# Patient Record
Sex: Male | Born: 1955 | Race: White | Hispanic: No | Marital: Married
Health system: Southern US, Community
[De-identification: ages and names within clinical notes are randomized; demographics above are authoritative.]

## PROBLEM LIST (undated history)

## (undated) DIAGNOSIS — J159 Unspecified bacterial pneumonia: Secondary | ICD-10-CM

## (undated) DIAGNOSIS — I4891 Unspecified atrial fibrillation: Secondary | ICD-10-CM

## (undated) DIAGNOSIS — G610 Guillain-Barre syndrome: Secondary | ICD-10-CM

## (undated) DIAGNOSIS — J9621 Acute and chronic respiratory failure with hypoxia: Secondary | ICD-10-CM

## (undated) DIAGNOSIS — G901 Familial dysautonomia [Riley-Day]: Secondary | ICD-10-CM

---

## 2014-04-01 ENCOUNTER — Other Ambulatory Visit: Payer: Self-pay | Admitting: Sports Medicine

## 2014-04-01 DIAGNOSIS — M79672 Pain in left foot: Secondary | ICD-10-CM

## 2014-04-04 ENCOUNTER — Ambulatory Visit
Admission: RE | Admit: 2014-04-04 | Discharge: 2014-04-04 | Disposition: A | Discharge status: Home / Self Care | Payer: Federal, State, Local not specified - PPO | Source: Ambulatory Visit | Attending: Sports Medicine | Admitting: Sports Medicine

## 2014-04-04 DIAGNOSIS — M79672 Pain in left foot: Secondary | ICD-10-CM

## 2019-05-05 ENCOUNTER — Other Ambulatory Visit: Payer: Self-pay | Admitting: *Deleted

## 2019-05-05 ENCOUNTER — Telehealth (HOSPITAL_COMMUNITY): Payer: Self-pay | Admitting: Emergency Medicine

## 2019-05-05 DIAGNOSIS — Z9189 Other specified personal risk factors, not elsewhere classified: Secondary | ICD-10-CM

## 2019-05-05 NOTE — Telephone Encounter (Signed)
Reaching out to patient to offer assistance regarding upcoming cardiac imaging study; pt verbalizes understanding of appt date/time, parking situation and where to check in, pre-test NPO status and medications ordered, and verified current allergies; name and call back number provided for further questions should they arise Laurine Kuyper RN Navigator Cardiac Imaging [facility] [phone] office [phone] cell 

## 2019-05-05 NOTE — Telephone Encounter (Signed)
Returning call after patient had left me a VM, unable to speak at the time but asked me to call his wife. I called the wife's phone number x 2 with no answer and no answering machine.

## 2019-05-06 ENCOUNTER — Ambulatory Visit (HOSPITAL_COMMUNITY)
Admission: RE | Admit: 2019-05-06 | Discharge: 2019-05-06 | Disposition: A | Discharge status: Home / Self Care | Payer: Medicare Other | Source: Ambulatory Visit | Attending: Cardiovascular Disease | Admitting: Cardiovascular Disease

## 2019-05-06 ENCOUNTER — Other Ambulatory Visit: Payer: Self-pay

## 2019-05-06 DIAGNOSIS — Z9189 Other specified personal risk factors, not elsewhere classified: Secondary | ICD-10-CM

## 2019-05-06 DIAGNOSIS — I4892 Unspecified atrial flutter: Secondary | ICD-10-CM | POA: Diagnosis not present

## 2019-05-06 DIAGNOSIS — I2510 Atherosclerotic heart disease of native coronary artery without angina pectoris: Secondary | ICD-10-CM | POA: Insufficient documentation

## 2019-05-06 DIAGNOSIS — R918 Other nonspecific abnormal finding of lung field: Secondary | ICD-10-CM | POA: Diagnosis not present

## 2019-05-06 MED ORDER — NITROGLYCERIN 0.4 MG SL SUBL
0.8000 mg | SUBLINGUAL_TABLET | Freq: Once | SUBLINGUAL | Status: AC
  Administered 2019-05-06: 14:00:00 0.8 mg via SUBLINGUAL

## 2019-05-06 MED ORDER — IOHEXOL 350 MG/ML SOLN
100.0000 mL | Freq: Once | INTRAVENOUS | Status: AC | PRN
  Administered 2019-05-06: 100 mL via INTRAVENOUS

## 2019-05-06 MED ORDER — NITROGLYCERIN 0.4 MG SL SUBL
SUBLINGUAL_TABLET | SUBLINGUAL | Status: AC
  Filled 2019-05-06: qty 2

## 2019-05-07 ENCOUNTER — Telehealth: Payer: Self-pay | Admitting: Cardiovascular Disease

## 2019-05-07 DIAGNOSIS — I2510 Atherosclerotic heart disease of native coronary artery without angina pectoris: Secondary | ICD-10-CM | POA: Diagnosis not present

## 2019-05-07 NOTE — Telephone Encounter (Signed)
Per Dr. Johnsie Cancel, Radiology needs to call primary cardiologist. Will send to Marchia Bond RN, CT Navigator to help get over read to right doctor.

## 2019-05-07 NOTE — Telephone Encounter (Signed)
[facility] is calling about a call report about ct scoring. Please advise.

## 2019-06-22 ENCOUNTER — Ambulatory Visit: Payer: Medicare Other

## 2019-07-01 ENCOUNTER — Ambulatory Visit: Payer: Medicare Other | Attending: Internal Medicine

## 2019-07-01 DIAGNOSIS — Z23 Encounter for immunization: Secondary | ICD-10-CM

## 2019-07-01 NOTE — Progress Notes (Signed)
   Covid-19 Vaccination Clinic  Name:  Brian Espinoza    MRN: [id number] DOB: March 08, 1956  07/01/2019  [name] was observed post Covid-19 immunization for 15 minutes without incidence. He was provided with Vaccine Information Sheet and instruction to access the V-Safe system.   [name] was instructed to call 911 with any severe reactions post vaccine: Marland Kitchen Difficulty breathing  . Swelling of your face and throat  . A fast heartbeat  . A bad rash all over your body  . Dizziness and weakness    Immunizations Administered    Name Date Dose VIS Date Route   Pfizer COVID-19 Vaccine 07/01/2019  5:13 PM 0.3 mL 05/07/2019 Intramuscular   Manufacturer: [employer], [employer]   Lot: [id number]   NDC: [address]-6

## 2019-07-03 ENCOUNTER — Ambulatory Visit: Payer: Medicare Other

## 2019-07-26 ENCOUNTER — Ambulatory Visit: Payer: Medicare Other | Attending: Internal Medicine

## 2019-07-26 DIAGNOSIS — Z23 Encounter for immunization: Secondary | ICD-10-CM

## 2019-07-26 NOTE — Progress Notes (Signed)
   Covid-19 Vaccination Clinic  Name:  Brian Espinoza    MRN: [id number] DOB: [birth year]  07/26/2019  Mr. Brian Espinoza was observed post Covid-19 immunization for 15 minutes without incidence. He was provided with Vaccine Information Sheet and instruction to access the V-Safe system.   Mr. Brian Espinoza was instructed to call 911 with any severe reactions post vaccine: Marland Kitchen Difficulty breathing  . Swelling of your face and throat  . A fast heartbeat  . A bad rash all over your body  . Dizziness and weakness    Immunizations Administered    Name Date Dose VIS Date Route   Pfizer COVID-19 Vaccine 07/26/2019 12:53 PM 0.3 mL 05/07/2019 Intramuscular   Manufacturer: [employer], [employer]   Lot: [id number]   NDC: [address]-9

## 2020-06-05 ENCOUNTER — Other Ambulatory Visit (HOSPITAL_COMMUNITY): Payer: Medicare Other

## 2020-06-05 ENCOUNTER — Inpatient Hospital Stay
Admission: RE | Admit: 2020-06-05 | Discharge: 2020-09-01 | Disposition: A | Discharge status: Other Acute Inpatient Hospital | Payer: Medicare Other | Source: Other Acute Inpatient Hospital | Attending: Internal Medicine | Admitting: Internal Medicine

## 2020-06-05 DIAGNOSIS — R509 Fever, unspecified: Secondary | ICD-10-CM

## 2020-06-05 DIAGNOSIS — J9811 Atelectasis: Secondary | ICD-10-CM

## 2020-06-05 DIAGNOSIS — J9690 Respiratory failure, unspecified, unspecified whether with hypoxia or hypercapnia: Secondary | ICD-10-CM

## 2020-06-05 DIAGNOSIS — J189 Pneumonia, unspecified organism: Secondary | ICD-10-CM

## 2020-06-05 DIAGNOSIS — J159 Unspecified bacterial pneumonia: Secondary | ICD-10-CM | POA: Diagnosis present

## 2020-06-05 DIAGNOSIS — R0902 Hypoxemia: Secondary | ICD-10-CM

## 2020-06-05 DIAGNOSIS — G610 Guillain-Barre syndrome: Secondary | ICD-10-CM | POA: Diagnosis present

## 2020-06-05 DIAGNOSIS — I4891 Unspecified atrial fibrillation: Secondary | ICD-10-CM | POA: Diagnosis present

## 2020-06-05 DIAGNOSIS — Z931 Gastrostomy status: Secondary | ICD-10-CM

## 2020-06-05 DIAGNOSIS — G901 Familial dysautonomia [Riley-Day]: Secondary | ICD-10-CM

## 2020-06-05 DIAGNOSIS — J9621 Acute and chronic respiratory failure with hypoxia: Secondary | ICD-10-CM | POA: Diagnosis present

## 2020-06-05 HISTORY — DX: Unspecified bacterial pneumonia: J15.9

## 2020-06-05 HISTORY — DX: Unspecified atrial fibrillation: I48.91

## 2020-06-05 HISTORY — DX: Familial dysautonomia (riley-day): G90.1

## 2020-06-05 HISTORY — DX: Acute and chronic respiratory failure with hypoxia: J96.21

## 2020-06-05 HISTORY — DX: Guillain-Barre syndrome: G61.0

## 2020-06-05 LAB — BLOOD GAS, ARTERIAL
Acid-Base Excess: 3.4 mmol/L — ABNORMAL HIGH (ref 0.0–2.0)
Bicarbonate: 28.6 mmol/L — ABNORMAL HIGH (ref 20.0–28.0)
FIO2: 50
O2 Saturation: 98.5 %
Patient temperature: 37.5
pCO2 arterial: 54.8 mmHg — ABNORMAL HIGH (ref 32.0–48.0)
pH, Arterial: 7.34 — ABNORMAL LOW (ref 7.350–7.450)
pO2, Arterial: 131 mmHg — ABNORMAL HIGH (ref 83.0–108.0)

## 2020-06-05 MED ORDER — IOHEXOL 180 MG/ML  SOLN
50.0000 mL | Freq: Once | INTRAMUSCULAR | Status: AC | PRN
  Administered 2020-06-05: 50 mL

## 2020-06-06 LAB — CBC WITH DIFFERENTIAL/PLATELET
Abs Immature Granulocytes: 0.2 10*3/uL — ABNORMAL HIGH (ref 0.00–0.07)
Basophils Absolute: 0.1 10*3/uL (ref 0.0–0.1)
Basophils Relative: 0 %
Eosinophils Absolute: 0 10*3/uL (ref 0.0–0.5)
Eosinophils Relative: 0 %
HCT: 31.5 % — ABNORMAL LOW (ref 39.0–52.0)
Hemoglobin: 10 g/dL — ABNORMAL LOW (ref 13.0–17.0)
Immature Granulocytes: 1 %
Lymphocytes Relative: 13 %
Lymphs Abs: 2.3 10*3/uL (ref 0.7–4.0)
MCH: 29.2 pg (ref 26.0–34.0)
MCHC: 31.7 g/dL (ref 30.0–36.0)
MCV: 92.1 fL (ref 80.0–100.0)
Monocytes Absolute: 0.9 10*3/uL (ref 0.1–1.0)
Monocytes Relative: 5 %
Neutro Abs: 14.8 10*3/uL — ABNORMAL HIGH (ref 1.7–7.7)
Neutrophils Relative %: 81 %
Platelets: 223 10*3/uL (ref 150–400)
RBC: 3.42 MIL/uL — ABNORMAL LOW (ref 4.22–5.81)
RDW: 16 % — ABNORMAL HIGH (ref 11.5–15.5)
WBC: 18.3 10*3/uL — ABNORMAL HIGH (ref 4.0–10.5)
nRBC: 0 % (ref 0.0–0.2)

## 2020-06-06 LAB — COMPREHENSIVE METABOLIC PANEL
ALT: 43 U/L (ref 0–44)
AST: 32 U/L (ref 15–41)
Albumin: 2.7 g/dL — ABNORMAL LOW (ref 3.5–5.0)
Alkaline Phosphatase: 63 U/L (ref 38–126)
Anion gap: 7 (ref 5–15)
BUN: 12 mg/dL (ref 8–23)
CO2: 29 mmol/L (ref 22–32)
Calcium: 8.9 mg/dL (ref 8.9–10.3)
Chloride: 100 mmol/L (ref 98–111)
Creatinine, Ser: 0.33 mg/dL — ABNORMAL LOW (ref 0.61–1.24)
GFR, Estimated: >60 mL/min (ref >60)
Glucose, Bld: 145 mg/dL — ABNORMAL HIGH (ref 70–99)
Potassium: 3.9 mmol/L (ref 3.5–5.1)
Sodium: 136 mmol/L (ref 135–145)
Total Bilirubin: 0.8 mg/dL (ref 0.3–1.2)
Total Protein: 6.8 g/dL (ref 6.5–8.1)

## 2020-06-06 LAB — HEMOGLOBIN A1C
Hgb A1c MFr Bld: 4.9 % (ref 4.8–5.6)
Mean Plasma Glucose: 93.93 mg/dL

## 2020-06-06 LAB — URINALYSIS, ROUTINE W REFLEX MICROSCOPIC
Bilirubin Urine: NEGATIVE
Glucose, UA: NEGATIVE mg/dL
Hgb urine dipstick: NEGATIVE
Ketones, ur: NEGATIVE mg/dL
Leukocytes,Ua: NEGATIVE
Nitrite: NEGATIVE
Protein, ur: NEGATIVE mg/dL
Specific Gravity, Urine: 1.025 (ref 1.005–1.030)
pH: 6 (ref 5.0–8.0)

## 2020-06-06 LAB — TSH: TSH: 3.257 u[IU]/mL (ref 0.350–4.500)

## 2020-06-06 NOTE — Consult Note (Signed)
Referring Physician: S. Manson Passey, MD  Brian Espinoza is an 65 y.o. male.                       Chief Complaint: Atrial fibrillation and CHF   HPI: 65 years old white male with PMH of Acute inflammatory demyelinating polyneuropathy (AIDP), paroxysmal atrial flutter/atrial fibriullation, anemia, acute on chronic respiratory failure with hypoxia, S/P pneumonia, S/P tracheostomy, S/P PEG tube placement, Hypertension, type 2 DM, Hyperlipidemia, Chronic systolic heart failure and CAD is admitted for long term acute care. Patient has trach collar and is sedated with fentanyl drip for agitation. He is also on IV Cefepime for pneumonia.   Past medical history: As per HPI.   The histories are not reviewed yet. Please review them in the "History" navigator section and refresh this SmartLink.  No family history on file. Social History:  has no history on file for tobacco use, alcohol use, and drug use.  Allergies: No Known Allergies  No medications prior to admission.  Fentanyl 1-200 mcg/hr as needed. Guaifenesin 10 ml q 6 hr. Versed 2 mg. IV q 2 hr as needed. Cefepime 2 gm q 8 hr. Oxycodone 10 mg. q 6 hr as needed. Lyrica 150 mg. 3 times daily. Seroquel 25 mg. 3 times daily.  Results for orders placed or performed during the hospital encounter of 06/05/20 (from the past 48 hour(s))  Blood gas, arterial     Status: Abnormal   Collection Time: 06/05/20  8:50 PM  Result Value Ref Range   FIO2 50.00    pH, Arterial 7.340 (L) 7.350 - 7.450   pCO2 arterial 54.8 (H) 32.0 - 48.0 mmHg   pO2, Arterial 131 (H) 83.0 - 108.0 mmHg   Bicarbonate 28.6 (H) 20.0 - 28.0 mmol/L   Acid-Base Excess 3.4 (H) 0.0 - 2.0 mmol/L   O2 Saturation 98.5 %   Patient temperature 37.5    Collection site RIGHT RADIAL    Drawn by COLLECTED BY RT     Comment: C.HERNANDEZ RCP   Sample type ARTERIAL    Allens test (pass/fail) PASS PASS    Comment: Performed at [facility], [address], [place], [place] [address]   Comprehensive metabolic panel     Status: Abnormal   Collection Time: 06/06/20  4:18 AM  Result Value Ref Range   Sodium 136 135 - 145 mmol/L   Potassium 3.9 3.5 - 5.1 mmol/L   Chloride 100 98 - 111 mmol/L   CO2 29 22 - 32 mmol/L   Glucose, Bld 145 (H) 70 - 99 mg/dL    Comment: Glucose reference range applies only to samples taken after fasting for at least 8 hours.   BUN 12 8 - 23 mg/dL   Creatinine, Ser 3.41 (L) 0.61 - 1.24 mg/dL   Calcium 8.9 8.9 - 93.7 mg/dL   Total Protein 6.8 6.5 - 8.1 g/dL   Albumin 2.7 (L) 3.5 - 5.0 g/dL   AST 32 15 - 41 U/L   ALT 43 0 - 44 U/L   Alkaline Phosphatase 63 38 - 126 U/L   Total Bilirubin 0.8 0.3 - 1.2 mg/dL   GFR, Estimated >90 >24 mL/min    Comment: (NOTE) Calculated using the CKD-EPI Creatinine Equation (2021)    Anion gap 7 5 - 15    Comment: Performed at [facility], [address], [place], [place] [address]  CBC with Differential/Platelet     Status: Abnormal  Collection Time: 06/06/20  4:18 AM  Result Value Ref Range   WBC 18.3 (H) 4.0 - 10.5 K/uL   RBC 3.42 (L) 4.22 - 5.81 MIL/uL   Hemoglobin 10.0 (L) 13.0 - 17.0 g/dL   HCT 62.7 (L) 03.5 - 00.9 %   MCV 92.1 80.0 - 100.0 fL   MCH 29.2 26.0 - 34.0 pg   MCHC 31.7 30.0 - 36.0 g/dL   RDW 38.1 (H) 82.9 - 93.7 %   Platelets 223 150 - 400 K/uL   nRBC 0.0 0.0 - 0.2 %   Neutrophils Relative % 81 %   Neutro Abs 14.8 (H) 1.7 - 7.7 K/uL   Lymphocytes Relative 13 %   Lymphs Abs 2.3 0.7 - 4.0 K/uL   Monocytes Relative 5 %   Monocytes Absolute 0.9 0.1 - 1.0 K/uL   Eosinophils Relative 0 %   Eosinophils Absolute 0.0 0.0 - 0.5 K/uL   Basophils Relative 0 %   Basophils Absolute 0.1 0.0 - 0.1 K/uL   Immature Granulocytes 1 %   Abs Immature Granulocytes 0.20 (H) 0.00 - 0.07 K/uL    Comment: Performed at [facility], [address] [place], [place] [address]  Hemoglobin A1c     Status: None   Collection Time: 06/06/20  4:18 AM  Result Value Ref Range   Hgb A1c MFr Bld 4.9  4.8 - 5.6 %    Comment: (NOTE) Pre diabetes:          5.7%-6.4%  Diabetes:              >6.4%  Glycemic control for   <7.0% adults with diabetes    Mean Plasma Glucose 93.93 mg/dL    Comment: Performed at [facility], [address] [place], [place] [address]  TSH     Status: None   Collection Time: 06/06/20  4:18 AM  Result Value Ref Range   TSH 3.257 0.350 - 4.500 uIU/mL    Comment: Performed by a 3rd Generation assay with a functional sensitivity of <=0.01 uIU/mL. Performed at [facility], [address] [place], [place] [address]   Urinalysis, Routine w reflex microscopic Urine, Random     Status: Abnormal   Collection Time: 06/06/20 11:26 AM  Result Value Ref Range   Color, Urine AMBER (A) YELLOW    Comment: BIOCHEMICALS MAY BE AFFECTED BY COLOR   APPearance HAZY (A) CLEAR   Specific Gravity, Urine 1.025 1.005 - 1.030   pH 6.0 5.0 - 8.0   Glucose, UA NEGATIVE NEGATIVE mg/dL   Hgb urine dipstick NEGATIVE NEGATIVE   Bilirubin Urine NEGATIVE NEGATIVE   Ketones, ur NEGATIVE NEGATIVE mg/dL   Protein, ur NEGATIVE NEGATIVE mg/dL   Nitrite NEGATIVE NEGATIVE   Leukocytes,Ua NEGATIVE NEGATIVE    Comment: Performed at [facility], [address], [place], [place] [address]   DG Chest 1 View  Result Date: 06/05/2020 CLINICAL DATA:  65 year old male status post percutaneous gastrostomy placement. Respiratory failure. EXAM: CHEST  1 VIEW COMPARISON:  Chest CT dated 05/06/2019. FINDINGS: Tracheostomy with tip approximately 7.5 cm above the carina. Left sided PICC with tip over central SVC. Evaluation is limited due to patient's positioning. Right lung base opacity, likely combination of pleural effusion and associated atelectasis or infiltrate. There is overall decreased volume in the right lung with expansion of the left hemithorax. No pneumothorax. The cardiac borders are silhouetted. No acute osseous pathology. IMPRESSION: 1. Tracheostomy above the carina. 2.  Right lung base opacity, likely combination of pleural effusion and associated atelectasis or infiltrate. Electronically Signed   By: Elgie Collard M.D.   On: 06/05/2020 22:40   DG ABDOMEN PEG TUBE LOCATION  Result Date: 06/05/2020 CLINICAL DATA:  Peg placement EXAM: ABDOMEN - 1 VIEW COMPARISON:  None. FINDINGS: Contrast is seen within the stomach. No contrast extravasation. Gastrostomy tube projects over the mid to distal stomach. IMPRESSION: Peg tube within the stomach.  No contrast extravasation. Electronically Signed   By: Charlett Nose M.D.   On: 06/05/2020 22:39    Review Of Systems As per HPI.  P:79, R-18-24, BP : 137/72, O2 sat 98 % on 40 % FiO2, 12 A/C General appearance: agitated, appears stated age and moderate respiratory distress Head: Normocephalic, atraumatic. Eyes: Blue eyes, pale conjunctiva, corneas clear.  Neck: No adenopathy, no carotid bruit, no JVD, supple, symmetrical, tracheostomy in place. Resp: Clearing to auscultation bilaterally. Cardio: Regular rate and rhythm, S1, S2 normal, II/VI systolic murmur, no click, rub or gallop GI: Soft, non-tender; bowel sounds normal; no organomegaly. Extremities: Trace edema, no cyanosis or clubbing. Skin: Warm and dry.  Neurologic: Alert and oriented X 0, normal strength. Repeated jerky movements of the upper body.  Assessment/Plan Acute on chronic respiratory failure with hypoxia Paroxysmal atrial fibrillation with RVR Anemia CAD H/O CHF Metabolic encephalopathy S/P Tracheostomy S/P PEG Type 2 DM S/P pneumonia  Agree with metoprolol use as needed for heart rate control. Repeat Echocardiogram for LV function. Consider amiodarone for atrial fibrillation rate control if needed. Consider small dose lanoxin use if has RV failure Resume anticoagulation when Hemoglobin stabilizes.  Time spent: Review of old records, Lab, x-rays, EKG, other cardiac tests, examination, discussion with patient/Nurse/Physician over 70  minutes.  Ricki Rodriguez, MD  06/06/2020, 6:19 PM

## 2020-06-07 ENCOUNTER — Other Ambulatory Visit (HOSPITAL_COMMUNITY): Payer: Medicare Other

## 2020-06-07 DIAGNOSIS — J9621 Acute and chronic respiratory failure with hypoxia: Secondary | ICD-10-CM

## 2020-06-07 DIAGNOSIS — I4891 Unspecified atrial fibrillation: Secondary | ICD-10-CM

## 2020-06-07 DIAGNOSIS — G901 Familial dysautonomia [Riley-Day]: Secondary | ICD-10-CM

## 2020-06-07 DIAGNOSIS — G610 Guillain-Barre syndrome: Secondary | ICD-10-CM | POA: Diagnosis not present

## 2020-06-07 DIAGNOSIS — J159 Unspecified bacterial pneumonia: Secondary | ICD-10-CM

## 2020-06-07 LAB — ECHOCARDIOGRAM COMPLETE
AR max vel: 3.6 cm2
AV Area VTI: 3.58 cm2
AV Area mean vel: 3.58 cm2
AV Mean grad: 5 mmHg
AV Peak grad: 10.4 mmHg
Ao pk vel: 1.61 m/s
Area-P 1/2: 4.15 cm2
S' Lateral: 3.6 cm

## 2020-06-07 MED ORDER — PERFLUTREN LIPID MICROSPHERE
1.0000 mL | INTRAVENOUS | Status: AC | PRN
Start: 2020-06-07 — End: 2020-06-07
  Administered 2020-06-07: 4 mL via INTRAVENOUS

## 2020-06-07 NOTE — Progress Notes (Signed)
  Echocardiogram 2D Echocardiogram has been performed with Definity.  Gerda Diss 06/07/2020, 9:38 AM

## 2020-06-07 NOTE — Progress Notes (Signed)
Ref: Elana Alm, MD   Subjective:  Sedated and on ventilator support. Echocardiogram with preserved LV systolic function with mild MR. Monitor shows sinus rhythm.  Objective:  Vital Signs in the last 24 hours: P: 89, R: 15, BP: 138/72 O2 sat 97 % on 12 A/C and 40 % FiO2.   Physical Exam: BP Readings from Last 1 Encounters:  05/06/19 124/79     Wt Readings from Last 1 Encounters:  04/04/14 101.2 kg    Weight change:  There is no height or weight on file to calculate BMI. HEENT: [place]/AT, Eyes-Blue, Conjunctiva-Pale, Sclera-Non-icteric Neck: No JVD, No bruit, Trachea midline. Lungs:  Clear, Bilateral. Cardiac:  Regular rhythm, normal S1 and S2, no S3. II/VI systolic murmur. Abdomen:  Soft, non-tender. BS present. Extremities:  Trace edema present. No cyanosis. No clubbing. CNS: AxOx0, Decreased jerky movements of upper body today.  Skin: Warm and dry.   Intake/Output from previous day: No intake/output data recorded.    Lab Results: BMET    Component Value Date/Time   NA 136 06/06/2020 0418   K 3.9 06/06/2020 0418   CL 100 06/06/2020 0418   CO2 29 06/06/2020 0418   GLUCOSE 145 (H) 06/06/2020 0418   BUN 12 06/06/2020 0418   CREATININE 0.33 (L) 06/06/2020 0418   CALCIUM 8.9 06/06/2020 0418   GFRNONAA >60 06/06/2020 0418   CBC    Component Value Date/Time   WBC 18.3 (H) 06/06/2020 0418   RBC 3.42 (L) 06/06/2020 0418   HGB 10.0 (L) 06/06/2020 0418   HCT 31.5 (L) 06/06/2020 0418   PLT 223 06/06/2020 0418   MCV 92.1 06/06/2020 0418   MCH 29.2 06/06/2020 0418   MCHC 31.7 06/06/2020 0418   RDW 16.0 (H) 06/06/2020 0418   LYMPHSABS 2.3 06/06/2020 0418   MONOABS 0.9 06/06/2020 0418   EOSABS 0.0 06/06/2020 0418   BASOSABS 0.1 06/06/2020 0418   HEPATIC Function Panel Recent Labs    06/06/20 0418  PROT 6.8   HEMOGLOBIN A1C No components found for: HGA1C,  MPG CARDIAC ENZYMES No results found for: CKTOTAL, CKMB, CKMBINDEX, TROPONINI BNP No results for  input(s): PROBNP in the last 8760 hours. TSH Recent Labs    06/06/20 0418  TSH 3.257   CHOLESTEROL No results for input(s): CHOL in the last 8760 hours.  Scheduled Meds: Continuous Infusions: PRN Meds:.  Assessment/Plan: Acute on chronic respiratory failure with hypoxia Paroxysmal atrial fibrillation Anemia, chronic blood loss CAD Metabolic encephalopathy S/P tracheostomy S/P acute demyelinating polyneuropathy S/P PEG Type 2 DM S/P Pneumonia  Add small dose aspirin. Continue heparin SQ for DVT prophylaxis.    LOS: 0 days   Time spent including chart review, lab review, examination, discussion with patient/Nurse and family (Daughter) : 30 min   Orpah Cobb  MD  06/07/2020, 5:23 PM

## 2020-06-07 NOTE — Consult Note (Signed)
Pulmonary Critical Care Medicine [facility] [place]  PULMONARY SERVICE  Date of Service: 06/07/2020  PULMONARY CRITICAL CARE CONSULT   [name]  YIF:[id number]  DOB: [birth year]   DOA: 06/05/2020  Referring Physician: Carron Curie, MD  HPI: Brian Espinoza is a 65 y.o. male seen for follow up of Acute on Chronic Respiratory Failure.  Patient has multiple medical problems presented to the hospital with paresthesias sensory loss in the hands and feet and also difficulty walking.  Patient had been apparently complaining of having this difficulty which progressed into an ascending symmetric paralysis.  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  The patient received 5 days of plasmapheresis along with 5 days of IVIG and then the subsequent 5 days of IVIG was also given.  There was only slight improvement in the patient's symptoms and also had poor level of alertness.  Apparently it appears the patient did not have an EMG done at the other facility.  Review of Systems:  ROS performed and is unremarkable other than noted above.  Past Medical History:  Diagnosis Date  . Atrial flutter (*)  . Back pain  . CHF (congestive heart failure) (*)  . Diverticulosis  . GERD (gastroesophageal reflux disease)  . Hypertension  . Lung nodule  . Non-rheumatic mitral regurgitation  . Renal cyst  . Typical atrial flutter s/p CTI ablation 05/2019 04/09/2019   Past Surgical History:  Procedure Laterality Date  . Appendectomy  . Cataract extraction  . Cholecystectomy  . Colonoscopy w/ polypectomy  . Eye surgery Bilateral  . Knee scope  . Knee surgery Left  ARTHROPLASTY  . Tonsillectomy  . Wisdom tooth extraction   No Known Allergies    Medications: Reviewed on Rounds  Physical Exam:  Vitals: Temperature is 98.9 pulse 72 respiratory rate 17 blood pressure 156/78 saturations 98%  Ventilator Settings on pressure assist control FiO2 is 40% IP 14 PEEP  8  . General: Comfortable at this time . Eyes: Grossly normal lids, irises & conjunctiva . ENT: grossly tongue is normal . Neck: no obvious mass . Cardiovascular: S1-S2 normal no gallop or rub . Respiratory: No rhonchi very coarse breath sounds . Abdomen: Soft and nontender . Skin: no rash seen on limited exam . Musculoskeletal: not rigid . Psychiatric:unable to assess . Neurologic: no seizure no involuntary movements         Labs on Admission:  Basic Metabolic Panel: Recent Labs  Lab 06/06/20 0418  NA 136  K 3.9  CL 100  CO2 29  GLUCOSE 145*  BUN 12  CREATININE 0.33*  CALCIUM 8.9    Recent Labs  Lab 06/05/20 2050  PHART 7.340*  PCO2ART 54.8*  PO2ART 131*  HCO3 28.6*  O2SAT 98.5    Liver Function Tests: Recent Labs  Lab 06/06/20 0418  AST 32  ALT 43  ALKPHOS 63  BILITOT 0.8  PROT 6.8  ALBUMIN 2.7*   No results for input(s): LIPASE, AMYLASE in the last 168 hours. No results for input(s): AMMONIA in the last 168 hours.  CBC: Recent Labs  Lab 06/06/20 0418  WBC 18.3*  NEUTROABS 14.8*  HGB 10.0*  HCT 31.5*  MCV 92.1  PLT 223    Cardiac Enzymes: No results for input(s): CKTOTAL, CKMB, CKMBINDEX, TROPONINI in the last 168 hours.  BNP (last 3 results) No results for input(s): BNP in the last 8760 hours.  ProBNP (last 3 results) No results for input(s): PROBNP in  the last 8760 hours.   Radiological Exams on Admission: DG Chest 1 View  Result Date: 06/05/2020 CLINICAL DATA:  65 year old male status post percutaneous gastrostomy placement. Respiratory failure. EXAM: CHEST  1 VIEW COMPARISON:  Chest CT dated 05/06/2019. FINDINGS: Tracheostomy with tip approximately 7.5 cm above the carina. Left sided PICC with tip over central SVC. Evaluation is limited due to patient's positioning. Right lung base opacity, likely combination of pleural effusion and associated atelectasis or infiltrate. There is overall decreased volume in the right lung with  expansion of the left hemithorax. No pneumothorax. The cardiac borders are silhouetted. No acute osseous pathology. IMPRESSION: 1. Tracheostomy above the carina. 2. Right lung base opacity, likely combination of pleural effusion and associated atelectasis or infiltrate. Electronically Signed   By: Elgie CollardArash  Radparvar M.D.   On: 06/05/2020 22:40   DG ABDOMEN PEG TUBE LOCATION  Result Date: 06/05/2020 CLINICAL DATA:  Peg placement EXAM: ABDOMEN - 1 VIEW COMPARISON:  None. FINDINGS: Contrast is seen within the stomach. No contrast extravasation. Gastrostomy tube projects over the mid to distal stomach. IMPRESSION: Peg tube within the stomach.  No contrast extravasation. Electronically Signed   By: Charlett NoseKevin  Dover M.D.   On: 06/05/2020 22:39   ECHOCARDIOGRAM COMPLETE  Result Date: 06/07/2020    ECHOCARDIOGRAM REPORT   Patient Name:   Brian Espinoza Date of Exam: 06/07/2020 Medical Rec #:  [id number]  Height: Accession #:    [id number] Weight:       223.0 lb Date of Birth:  [birth year]  BSA:          2.171 m Patient Age:    64 years   BP:           145/74 mmHg Patient Gender: M          HR:           104 bpm. Exam Location:  Inpatient Procedure: 2D Echo, Cardiac Doppler, Color Doppler and Intracardiac            Opacification Agent Indications:     CHF-Acute Diastolic  History:         Patient has no prior history of Echocardiogram examinations.  Sonographer:     Ross LudwigArthur Guy RDCS (AE) Referring Phys:  [id number] Florian BuffSTEPHANIE DELORES BROWN Diagnosing Phys: Orpah CobbAjay Kadakia MD  Sonographer Comments: Technically difficult study due to poor echo windows and echo performed with patient supine and on artificial respirator. Technically difficult study due to patient movement. IMPRESSIONS  1. Left ventricular ejection fraction, by estimation, is 55 to 60%. The left ventricle has normal function. The left ventricle has no regional wall motion abnormalities. There is mild concentric left ventricular hypertrophy. Left ventricular diastolic  parameters are consistent with Grade II diastolic dysfunction (pseudonormalization).  2. Right ventricular systolic function is normal. The right ventricular size is normal.  3. Left atrial size was mildly dilated.  4. The mitral valve is grossly normal. Mild mitral valve regurgitation.  5. The aortic valve is tricuspid. There is mild calcification of the aortic valve. There is mild thickening of the aortic valve. Aortic valve regurgitation is not visualized. Mild aortic valve sclerosis is present, with no evidence of aortic valve stenosis.  6. Aortic dilatation noted. There is mild dilatation of the ascending aorta, measuring 43 mm.  7. The inferior vena cava is dilated in size with <50% respiratory variability, suggesting right atrial pressure of 15 mmHg. FINDINGS  Left Ventricle: Left ventricular ejection fraction, by estimation, is 55 to 60%. The left ventricle  has normal function. The left ventricle has no regional wall motion abnormalities. Definity contrast agent was given IV to delineate the left ventricular  endocardial borders. The left ventricular internal cavity size was normal in size. There is mild concentric left ventricular hypertrophy. Left ventricular diastolic parameters are consistent with Grade II diastolic dysfunction (pseudonormalization). Right Ventricle: The right ventricular size is normal. No increase in right ventricular wall thickness. Right ventricular systolic function is normal. Left Atrium: Left atrial size was mildly dilated. Right Atrium: Right atrial size was normal in size. Pericardium: There is no evidence of pericardial effusion. Mitral Valve: The mitral valve is grossly normal. Mild mitral annular calcification. Mild mitral valve regurgitation. Tricuspid Valve: The tricuspid valve is normal in structure. Tricuspid valve regurgitation is trivial. Aortic Valve: The aortic valve is tricuspid. There is mild calcification of the aortic valve. There is mild thickening of the aortic  valve. There is mild aortic valve annular calcification. Aortic valve regurgitation is not visualized. Mild aortic valve sclerosis is present, with no evidence of aortic valve stenosis. Aortic valve mean gradient measures 5.0 mmHg. Aortic valve peak gradient measures 10.4 mmHg. Aortic valve area, by VTI measures 3.58 cm. Pulmonic Valve: The pulmonic valve was not well visualized. Pulmonic valve regurgitation is not visualized. Aorta: Aortic dilatation noted. There is mild dilatation of the ascending aorta, measuring 43 mm. There is minimal (Grade I) atheroma plaque. Venous: The inferior vena cava is dilated in size with less than 50% respiratory variability, suggesting right atrial pressure of 15 mmHg. IAS/Shunts: The interatrial septum was not well visualized.  LEFT VENTRICLE PLAX 2D LVIDd:         5.40 cm  Diastology LVIDs:         3.60 cm  LV e' medial:    6.85 cm/s LV PW:         1.60 cm  LV E/e' medial:  12.8 LV IVS:        1.50 cm  LV e' lateral:   7.40 cm/s LVOT diam:     2.50 cm  LV E/e' lateral: 11.8 LV SV:         86 LV SV Index:   40 LVOT Area:     4.91 cm  RIGHT VENTRICLE             IVC RV Basal diam:  3.50 cm     IVC diam: 2.10 cm RV Mid diam:    3.40 cm RV S prime:     26.80 cm/s TAPSE (M-mode): 2.7 cm LEFT ATRIUM             Index       RIGHT ATRIUM           Index LA diam:        3.60 cm 1.66 cm/m  RA Area:     15.00 cm LA Vol (A2C):   40.4 ml 18.61 ml/m RA Volume:   36.60 ml  16.86 ml/m LA Vol (A4C):   52.2 ml 24.04 ml/m LA Biplane Vol: 49.9 ml 22.98 ml/m  AORTIC VALVE AV Area (Vmax):    3.60 cm AV Area (Vmean):   3.58 cm AV Area (VTI):     3.58 cm AV Vmax:           161.00 cm/s AV Vmean:          98.500 cm/s AV VTI:            0.241 m AV Peak Grad:  10.4 mmHg AV Mean Grad:      5.0 mmHg LVOT Vmax:         118.00 cm/s LVOT Vmean:        71.900 cm/s LVOT VTI:          0.176 m LVOT/AV VTI ratio: 0.73  AORTA Ao Root diam: 4.30 cm MITRAL VALVE MV Area (PHT): 4.15 cm    SHUNTS MV Decel  Time: 183 msec    Systemic VTI:  0.18 m MV E velocity: 87.40 cm/s  Systemic Diam: 2.50 cm MV A velocity: 61.70 cm/s MV E/A ratio:  1.42 Orpah Cobb MD Electronically signed by Orpah Cobb MD Signature Date/Time: 06/07/2020/1:59:26 PM    Final     Assessment/Plan Active Problems:   Acute on chronic respiratory failure with hypoxia (HCC)   Acute inflammatory demyelinating polyneuropathy (HCC)   Dysautonomia (HCC)   Atrial fibrillation with RVR (HCC)   Healthcare associated bacterial pneumonia   1. Acute on chronic respiratory failure with hypoxia respiratory therapy will check the rapid shallow index and try to start with the weaning protocol.  Patient right now appears to be doing fine appears to be comfortable. 2. Acute inflammatory demyelinating polyneuropathy patient will be evaluated by the physical therapy department and proceed with physical therapy as tolerated. 3. Dysautonomia we will continue to monitor the patient's blood pressure and vitals closely has been labile sometimes with elevation in blood pressure. 4. Atrial fibrillation atrial flutter this has been persistent with atrial fibrillation and flutter right now appears to be rate controlled we will continue to follow along closely. 5. Healthcare associated pneumonia has been treated we will continue with present management.  I have personally seen and evaluated the patient, evaluated laboratory and imaging results, formulated the assessment and plan and placed orders. The Patient requires high complexity decision making with multiple systems involvement.  Case was discussed on Rounds with the Respiratory Therapy Director and the Respiratory staff Time Spent  Yevonne Pax, MD [facility] Pulmonary Critical Care Medicine Sleep Medicine

## 2020-06-08 DIAGNOSIS — G901 Familial dysautonomia [Riley-Day]: Secondary | ICD-10-CM | POA: Diagnosis not present

## 2020-06-08 DIAGNOSIS — G610 Guillain-Barre syndrome: Secondary | ICD-10-CM | POA: Diagnosis not present

## 2020-06-08 DIAGNOSIS — J9621 Acute and chronic respiratory failure with hypoxia: Secondary | ICD-10-CM | POA: Diagnosis not present

## 2020-06-08 DIAGNOSIS — I4891 Unspecified atrial fibrillation: Secondary | ICD-10-CM | POA: Diagnosis not present

## 2020-06-08 NOTE — Progress Notes (Signed)
Pulmonary Critical Care Medicine [facility] [place]   PULMONARY CRITICAL CARE SERVICE  PROGRESS NOTE  Date of Service: 06/08/2020  Brian Espinoza  TKZ:[id number]  DOB: 07-12-1955   DOA: 06/05/2020  Referring Physician: Carron Curie, MD  HPI: Brian Espinoza is a 65 y.o. male seen for follow up of Acute on Chronic Respiratory Failure.  Patient currently is on pressure control mode has been on 40% FiO2 with an IP of 14 PEEP of seven  Medications: Reviewed on Rounds  Physical Exam:  Vitals: Temperature is 99.1 pulse 82 respiratory rate 27 blood pressure is 177/90 saturations 98%  Ventilator Settings on pressure control FiO2 is 40% PEEP seven IP 14  . General: Comfortable at this time . Eyes: Grossly normal lids, irises & conjunctiva . ENT: grossly tongue is normal . Neck: no obvious mass . Cardiovascular: S1 S2 normal no gallop . Respiratory: No rhonchi very coarse breath . Abdomen: soft . Skin: no rash seen on limited exam . Musculoskeletal: not rigid . Psychiatric:unable to assess . Neurologic: no seizure no involuntary movements         Lab Data:   Basic Metabolic Panel: Recent Labs  Lab 06/06/20 0418  NA 136  K 3.9  CL 100  CO2 29  GLUCOSE 145*  BUN 12  CREATININE 0.33*  CALCIUM 8.9    ABG: Recent Labs  Lab 06/05/20 2050  PHART 7.340*  PCO2ART 54.8*  PO2ART 131*  HCO3 28.6*  O2SAT 98.5    Liver Function Tests: Recent Labs  Lab 06/06/20 0418  AST 32  ALT 43  ALKPHOS 63  BILITOT 0.8  PROT 6.8  ALBUMIN 2.7*   No results for input(s): LIPASE, AMYLASE in the last 168 hours. No results for input(s): AMMONIA in the last 168 hours.  CBC: Recent Labs  Lab 06/06/20 0418  WBC 18.3*  NEUTROABS 14.8*  HGB 10.0*  HCT 31.5*  MCV 92.1  PLT 223    Cardiac Enzymes: No results for input(s): CKTOTAL, CKMB, CKMBINDEX, TROPONINI in the last 168 hours.  BNP (last 3 results) No results for input(s): BNP in the last 8760 hours.  ProBNP (last 3  results) No results for input(s): PROBNP in the last 8760 hours.  Radiological Exams: ECHOCARDIOGRAM COMPLETE  Result Date: 06/07/2020    ECHOCARDIOGRAM REPORT   Patient Name:   Brian Espinoza Date of Exam: 06/07/2020 Medical Rec #:  [id number]  Height: Accession #:    [id number] Weight:       223.0 lb Date of Birth:  08/17/1955  BSA:          2.171 m Patient Age:    64 years   BP:           145/74 mmHg Patient Gender: M          HR:           104 bpm. Exam Location:  Inpatient Procedure: 2D Echo, Cardiac Doppler, Color Doppler and Intracardiac            Opacification Agent Indications:     CHF-Acute Diastolic  History:         Patient has no prior history of Echocardiogram examinations.  Sonographer:     Ross Ludwig RDCS (AE) Referring Phys:  [id number] Florian Buff BROWN Diagnosing Phys: Orpah Cobb MD  Sonographer Comments: Technically difficult study due to poor echo windows and echo performed with patient supine and on artificial respirator. Technically difficult study due to patient movement. IMPRESSIONS  1. Left ventricular  ejection fraction, by estimation, is 55 to 60%. The left ventricle has normal function. The left ventricle has no regional wall motion abnormalities. There is mild concentric left ventricular hypertrophy. Left ventricular diastolic parameters are consistent with Grade II diastolic dysfunction (pseudonormalization).  2. Right ventricular systolic function is normal. The right ventricular size is normal.  3. Left atrial size was mildly dilated.  4. The mitral valve is grossly normal. Mild mitral valve regurgitation.  5. The aortic valve is tricuspid. There is mild calcification of the aortic valve. There is mild thickening of the aortic valve. Aortic valve regurgitation is not visualized. Mild aortic valve sclerosis is present, with no evidence of aortic valve stenosis.  6. Aortic dilatation noted. There is mild dilatation of the ascending aorta, measuring 43 mm.  7. The inferior vena  cava is dilated in size with <50% respiratory variability, suggesting right atrial pressure of 15 mmHg. FINDINGS  Left Ventricle: Left ventricular ejection fraction, by estimation, is 55 to 60%. The left ventricle has normal function. The left ventricle has no regional wall motion abnormalities. Definity contrast agent was given IV to delineate the left ventricular  endocardial borders. The left ventricular internal cavity size was normal in size. There is mild concentric left ventricular hypertrophy. Left ventricular diastolic parameters are consistent with Grade II diastolic dysfunction (pseudonormalization). Right Ventricle: The right ventricular size is normal. No increase in right ventricular wall thickness. Right ventricular systolic function is normal. Left Atrium: Left atrial size was mildly dilated. Right Atrium: Right atrial size was normal in size. Pericardium: There is no evidence of pericardial effusion. Mitral Valve: The mitral valve is grossly normal. Mild mitral annular calcification. Mild mitral valve regurgitation. Tricuspid Valve: The tricuspid valve is normal in structure. Tricuspid valve regurgitation is trivial. Aortic Valve: The aortic valve is tricuspid. There is mild calcification of the aortic valve. There is mild thickening of the aortic valve. There is mild aortic valve annular calcification. Aortic valve regurgitation is not visualized. Mild aortic valve sclerosis is present, with no evidence of aortic valve stenosis. Aortic valve mean gradient measures 5.0 mmHg. Aortic valve peak gradient measures 10.4 mmHg. Aortic valve area, by VTI measures 3.58 cm. Pulmonic Valve: The pulmonic valve was not well visualized. Pulmonic valve regurgitation is not visualized. Aorta: Aortic dilatation noted. There is mild dilatation of the ascending aorta, measuring 43 mm. There is minimal (Grade I) atheroma plaque. Venous: The inferior vena cava is dilated in size with less than 50% respiratory  variability, suggesting right atrial pressure of 15 mmHg. IAS/Shunts: The interatrial septum was not well visualized.  LEFT VENTRICLE PLAX 2D LVIDd:         5.40 cm  Diastology LVIDs:         3.60 cm  LV e' medial:    6.85 cm/s LV PW:         1.60 cm  LV E/e' medial:  12.8 LV IVS:        1.50 cm  LV e' lateral:   7.40 cm/s LVOT diam:     2.50 cm  LV E/e' lateral: 11.8 LV SV:         86 LV SV Index:   40 LVOT Area:     4.91 cm  RIGHT VENTRICLE             IVC RV Basal diam:  3.50 cm     IVC diam: 2.10 cm RV Mid diam:    3.40 cm RV S prime:  26.80 cm/s TAPSE (M-mode): 2.7 cm LEFT ATRIUM             Index       RIGHT ATRIUM           Index LA diam:        3.60 cm 1.66 cm/m  RA Area:     15.00 cm LA Vol (A2C):   40.4 ml 18.61 ml/m RA Volume:   36.60 ml  16.86 ml/m LA Vol (A4C):   52.2 ml 24.04 ml/m LA Biplane Vol: 49.9 ml 22.98 ml/m  AORTIC VALVE AV Area (Vmax):    3.60 cm AV Area (Vmean):   3.58 cm AV Area (VTI):     3.58 cm AV Vmax:           161.00 cm/s AV Vmean:          98.500 cm/s AV VTI:            0.241 m AV Peak Grad:      10.4 mmHg AV Mean Grad:      5.0 mmHg LVOT Vmax:         118.00 cm/s LVOT Vmean:        71.900 cm/s LVOT VTI:          0.176 m LVOT/AV VTI ratio: 0.73  AORTA Ao Root diam: 4.30 cm MITRAL VALVE MV Area (PHT): 4.15 cm    SHUNTS MV Decel Time: 183 msec    Systemic VTI:  0.18 m MV E velocity: 87.40 cm/s  Systemic Diam: 2.50 cm MV A velocity: 61.70 cm/s MV E/A ratio:  1.42 Orpah Cobb MD Electronically signed by Orpah Cobb MD Signature Date/Time: 06/07/2020/1:59:26 PM    Final     Assessment/Plan Active Problems:   Acute on chronic respiratory failure with hypoxia (HCC)   Acute inflammatory demyelinating polyneuropathy (HCC)   Dysautonomia (HCC)   Atrial fibrillation with RVR (HCC)   Healthcare associated bacterial pneumonia   1. Acute on chronic respiratory failure with hypoxia patient right now is not tolerating weaning remains on full support we will  continue 2. Acute inflammatory demyelinating polyneuropathy supportive care therapy as tolerated. 3. Dysautonomia we will continue to follow the patient's blood pressure closely. 4. Atrial fibrillation with rate controlled right now we will continue with supportive care and monitor 5. Healthcare associated pneumonia treated we will continue to follow along.   I have personally seen and evaluated the patient, evaluated laboratory and imaging results, formulated the assessment and plan and placed orders. The Patient requires high complexity decision making with multiple systems involvement.  Rounds were done with the Respiratory Therapy Director and Staff therapists and discussed with nursing staff also.  Yevonne Pax, MD [facility] Pulmonary Critical Care Medicine Sleep Medicine

## 2020-06-09 ENCOUNTER — Other Ambulatory Visit (HOSPITAL_COMMUNITY): Payer: Medicare Other

## 2020-06-09 ENCOUNTER — Encounter: Payer: Self-pay | Admitting: Internal Medicine

## 2020-06-09 DIAGNOSIS — G610 Guillain-Barre syndrome: Secondary | ICD-10-CM | POA: Diagnosis present

## 2020-06-09 DIAGNOSIS — J159 Unspecified bacterial pneumonia: Secondary | ICD-10-CM | POA: Diagnosis present

## 2020-06-09 DIAGNOSIS — G901 Familial dysautonomia [Riley-Day]: Secondary | ICD-10-CM | POA: Diagnosis not present

## 2020-06-09 DIAGNOSIS — J9621 Acute and chronic respiratory failure with hypoxia: Secondary | ICD-10-CM | POA: Diagnosis not present

## 2020-06-09 DIAGNOSIS — I4891 Unspecified atrial fibrillation: Secondary | ICD-10-CM | POA: Diagnosis present

## 2020-06-09 LAB — BASIC METABOLIC PANEL
Anion gap: 10 (ref 5–15)
BUN: 15 mg/dL (ref 8–23)
CO2: 28 mmol/L (ref 22–32)
Calcium: 8.7 mg/dL — ABNORMAL LOW (ref 8.9–10.3)
Chloride: 101 mmol/L (ref 98–111)
Creatinine, Ser: 0.32 mg/dL — ABNORMAL LOW (ref 0.61–1.24)
GFR, Estimated: >60 mL/min (ref >60)
Glucose, Bld: 115 mg/dL — ABNORMAL HIGH (ref 70–99)
Potassium: 4 mmol/L (ref 3.5–5.1)
Sodium: 139 mmol/L (ref 135–145)

## 2020-06-09 LAB — CBC
HCT: 31.3 % — ABNORMAL LOW (ref 39.0–52.0)
Hemoglobin: 9.5 g/dL — ABNORMAL LOW (ref 13.0–17.0)
MCH: 28.4 pg (ref 26.0–34.0)
MCHC: 30.4 g/dL (ref 30.0–36.0)
MCV: 93.7 fL (ref 80.0–100.0)
Platelets: 195 10*3/uL (ref 150–400)
RBC: 3.34 MIL/uL — ABNORMAL LOW (ref 4.22–5.81)
RDW: 15.3 % (ref 11.5–15.5)
WBC: 7.4 10*3/uL (ref 4.0–10.5)
nRBC: 0 % (ref 0.0–0.2)

## 2020-06-09 NOTE — Progress Notes (Signed)
Pulmonary Critical Care Medicine [facility] [place]   PULMONARY CRITICAL CARE SERVICE  PROGRESS NOTE  Date of Service: 06/09/2020  Brian Espinoza  CWC:[id number]  DOB: [birth year]   DOA: 06/05/2020  Referring Physician: Carron Curie, MD  HPI: Brian Espinoza is a 65 y.o. male seen for follow up of Acute on Chronic Respiratory Failure.  Patient is on pressure support the goal is for up to 6 hours of weaning today.  Medications: Reviewed on Rounds  Physical Exam:  Vitals: Temperature is 97.5 pulse 86 respiratory rate 12 blood pressure is 152/87 saturations 98%  Ventilator Settings on pressure support FiO2 40% pressure 12/7  . General: Comfortable at this time . Eyes: Grossly normal lids, irises & conjunctiva . ENT: grossly tongue is normal . Neck: no obvious mass . Cardiovascular: S1 S2 normal no gallop . Respiratory: Scattered rhonchi expansion is equal . Abdomen: soft . Skin: no rash seen on limited exam . Musculoskeletal: not rigid . Psychiatric:unable to assess . Neurologic: no seizure no involuntary movements         Lab Data:   Basic Metabolic Panel: Recent Labs  Lab 06/06/20 0418 06/09/20 0619  NA 136 139  K 3.9 4.0  CL 100 101  CO2 29 28  GLUCOSE 145* 115*  BUN 12 15  CREATININE 0.33* 0.32*  CALCIUM 8.9 8.7*    ABG: Recent Labs  Lab 06/05/20 2050  PHART 7.340*  PCO2ART 54.8*  PO2ART 131*  HCO3 28.6*  O2SAT 98.5    Liver Function Tests: Recent Labs  Lab 06/06/20 0418  AST 32  ALT 43  ALKPHOS 63  BILITOT 0.8  PROT 6.8  ALBUMIN 2.7*   No results for input(s): LIPASE, AMYLASE in the last 168 hours. No results for input(s): AMMONIA in the last 168 hours.  CBC: Recent Labs  Lab 06/06/20 0418 06/09/20 0619  WBC 18.3* 7.4  NEUTROABS 14.8*  --   HGB 10.0* 9.5*  HCT 31.5* 31.3*  MCV 92.1 93.7  PLT 223 195    Cardiac Enzymes: No results for input(s): CKTOTAL, CKMB, CKMBINDEX, TROPONINI in the last 168 hours.  BNP (last 3  results) No results for input(s): BNP in the last 8760 hours.  ProBNP (last 3 results) No results for input(s): PROBNP in the last 8760 hours.  Radiological Exams: DG CHEST PORT 1 VIEW  Result Date: 06/09/2020 CLINICAL DATA:  Follow-up right lung atelectasis. Tracheostomy tube. EXAM: PORTABLE CHEST 1 VIEW COMPARISON:  06/05/2020 FINDINGS: Tracheostomy tube and left arm PICC line remain in expected position. Right lung volume loss is again seen. Right lower lobe opacification again seen, suspicious for right lower lobe collapse. Left lung remains clear. IMPRESSION: Stable right lung volume loss with probable right lower lobe collapse. Consider chest CT with contrast to exclude centrally obstructing mass or lymphadenopathy. Electronically Signed   By: Danae [place] M.D.   On: 06/09/2020 10:22    Assessment/Plan Active Problems:   Acute on chronic respiratory failure with hypoxia (HCC)   Acute inflammatory demyelinating polyneuropathy (HCC)   Dysautonomia (HCC)   Atrial fibrillation with RVR (HCC)   Healthcare associated bacterial pneumonia   1. Acute on chronic respiratory failure hypoxia plan is to continue with pressure support titrate oxygen continue pulmonary toilet. 2. Acute inflammatory demyelinating neuropathy supportive care physical therapy 3. Dysautonomia cardiology is following along 4. Atrial fibrillation rate is controlled at this time. 5. Healthcare associated pneumonia chest x-ray shows some volume loss on the right side we will consider a CT  scan for further follow-up   I have personally seen and evaluated the patient, evaluated laboratory and imaging results, formulated the assessment and plan and placed orders. The Patient requires high complexity decision making with multiple systems involvement.  Rounds were done with the Respiratory Therapy Director and Staff therapists and discussed with nursing staff also.  Yevonne Pax, MD [facility] Pulmonary Critical Care  Medicine Sleep Medicine

## 2020-06-10 DIAGNOSIS — G901 Familial dysautonomia [Riley-Day]: Secondary | ICD-10-CM | POA: Diagnosis not present

## 2020-06-10 DIAGNOSIS — G610 Guillain-Barre syndrome: Secondary | ICD-10-CM | POA: Diagnosis not present

## 2020-06-10 DIAGNOSIS — I4891 Unspecified atrial fibrillation: Secondary | ICD-10-CM | POA: Diagnosis not present

## 2020-06-10 DIAGNOSIS — J9621 Acute and chronic respiratory failure with hypoxia: Secondary | ICD-10-CM | POA: Diagnosis not present

## 2020-06-10 NOTE — Progress Notes (Signed)
Pulmonary Critical Care Medicine [facility] [place]   PULMONARY CRITICAL CARE SERVICE  PROGRESS NOTE  Date of Service: 06/10/2020  Brian Espinoza  [id number]  DOB: 05-16-1956   DOA: 06/05/2020  Referring Physician: Carron Curie, MD  HPI: Brian Espinoza is a 65 y.o. male seen for follow up of Acute on Chronic Respiratory Failure.  Patient is on pressure support currently on 12/7 for a 6-hour goal  Medications: Reviewed on Rounds  Physical Exam:  Vitals: Temperature is 98.4 pulse 87 respiratory rate is 14 blood pressure is 156/93 saturations 99  Ventilator Settings on pressure support FiO2 40% pressure 12/7  . General: Comfortable at this time . Eyes: Grossly normal lids, irises & conjunctiva . ENT: grossly tongue is normal . Neck: no obvious mass . Cardiovascular: S1 S2 normal no gallop . Respiratory: No rhonchi coarse breath sounds . Abdomen: soft . Skin: no rash seen on limited exam . Musculoskeletal: not rigid . Psychiatric:unable to assess . Neurologic: no seizure no involuntary movements         Lab Data:   Basic Metabolic Panel: Recent Labs  Lab 06/06/20 0418 06/09/20 0619  NA 136 139  K 3.9 4.0  CL 100 101  CO2 29 28  GLUCOSE 145* 115*  BUN 12 15  CREATININE 0.33* 0.32*  CALCIUM 8.9 8.7*    ABG: Recent Labs  Lab 06/05/20 2050  PHART 7.340*  PCO2ART 54.8*  PO2ART 131*  HCO3 28.6*  O2SAT 98.5    Liver Function Tests: Recent Labs  Lab 06/06/20 0418  AST 32  ALT 43  ALKPHOS 63  BILITOT 0.8  PROT 6.8  ALBUMIN 2.7*   No results for input(s): LIPASE, AMYLASE in the last 168 hours. No results for input(s): AMMONIA in the last 168 hours.  CBC: Recent Labs  Lab 06/06/20 0418 06/09/20 0619  WBC 18.3* 7.4  NEUTROABS 14.8*  --   HGB 10.0* 9.5*  HCT 31.5* 31.3*  MCV 92.1 93.7  PLT 223 195    Cardiac Enzymes: No results for input(s): CKTOTAL, CKMB, CKMBINDEX, TROPONINI in the last 168 hours.  BNP (last 3 results) No results  for input(s): BNP in the last 8760 hours.  ProBNP (last 3 results) No results for input(s): PROBNP in the last 8760 hours.  Radiological Exams: DG CHEST PORT 1 VIEW  Result Date: 06/09/2020 CLINICAL DATA:  Follow-up right lung atelectasis. Tracheostomy tube. EXAM: PORTABLE CHEST 1 VIEW COMPARISON:  06/05/2020 FINDINGS: Tracheostomy tube and left arm PICC line remain in expected position. Right lung volume loss is again seen. Right lower lobe opacification again seen, suspicious for right lower lobe collapse. Left lung remains clear. IMPRESSION: Stable right lung volume loss with probable right lower lobe collapse. Consider chest CT with contrast to exclude centrally obstructing mass or lymphadenopathy. Electronically Signed   By: Danae Orleans M.D.   On: 06/09/2020 10:22    Assessment/Plan Active Problems:   Acute on chronic respiratory failure with hypoxia (HCC)   Acute inflammatory demyelinating polyneuropathy (HCC)   Dysautonomia (HCC)   Atrial fibrillation with RVR (HCC)   Healthcare associated bacterial pneumonia   1. Acute on chronic respiratory failure with hypoxia we will continue with pressure support 12/7 and titrate as tolerated. 2. Acute inflammatory demyelinating polyneuropathy no change 3. Dysautonomia at baseline 4. Atrial fibrillation rate is controlled 5. Healthcare associated pneumonia treated with antibiotic   I have personally seen and evaluated the patient, evaluated laboratory and imaging results, formulated the assessment and plan and placed  orders. The Patient requires high complexity decision making with multiple systems involvement.  Rounds were done with the Respiratory Therapy Director and Staff therapists and discussed with nursing staff also.  Allyne Gee, MD [facility] Pulmonary Critical Care Medicine Sleep Medicine

## 2020-06-12 DIAGNOSIS — I4891 Unspecified atrial fibrillation: Secondary | ICD-10-CM | POA: Diagnosis not present

## 2020-06-12 DIAGNOSIS — G610 Guillain-Barre syndrome: Secondary | ICD-10-CM | POA: Diagnosis not present

## 2020-06-12 DIAGNOSIS — J9621 Acute and chronic respiratory failure with hypoxia: Secondary | ICD-10-CM | POA: Diagnosis not present

## 2020-06-12 DIAGNOSIS — G901 Familial dysautonomia [Riley-Day]: Secondary | ICD-10-CM | POA: Diagnosis not present

## 2020-06-12 LAB — CBC
HCT: 34 % — ABNORMAL LOW (ref 39.0–52.0)
Hemoglobin: 10.3 g/dL — ABNORMAL LOW (ref 13.0–17.0)
MCH: 28.3 pg (ref 26.0–34.0)
MCHC: 30.3 g/dL (ref 30.0–36.0)
MCV: 93.4 fL (ref 80.0–100.0)
Platelets: 227 10*3/uL (ref 150–400)
RBC: 3.64 MIL/uL — ABNORMAL LOW (ref 4.22–5.81)
RDW: 15.4 % (ref 11.5–15.5)
WBC: 9.9 10*3/uL (ref 4.0–10.5)
nRBC: 0 % (ref 0.0–0.2)

## 2020-06-12 LAB — BASIC METABOLIC PANEL
Anion gap: 11 (ref 5–15)
BUN: 14 mg/dL (ref 8–23)
CO2: 28 mmol/L (ref 22–32)
Calcium: 9.2 mg/dL (ref 8.9–10.3)
Chloride: 98 mmol/L (ref 98–111)
Creatinine, Ser: 0.32 mg/dL — ABNORMAL LOW (ref 0.61–1.24)
GFR, Estimated: >60 mL/min (ref >60)
Glucose, Bld: 145 mg/dL — ABNORMAL HIGH (ref 70–99)
Potassium: 4.4 mmol/L (ref 3.5–5.1)
Sodium: 137 mmol/L (ref 135–145)

## 2020-06-12 NOTE — Progress Notes (Signed)
Pulmonary Critical Care Medicine [facility] [place]   PULMONARY CRITICAL CARE SERVICE  PROGRESS NOTE  Date of Service: 06/12/2020  Brian Espinoza  QBH:[id number]  DOB: [birth year]   DOA: 06/05/2020  Referring Physician: Carron Curie, MD  HPI: Brian Espinoza is a 65 y.o. male seen for follow up of Acute on Chronic Respiratory Failure.  Patient currently is on pressure support has been on 40% FiO2 goal is for 4 hours  Medications: Reviewed on Rounds  Physical Exam:  Vitals: Temperature 98.0 pulse 102 respiratory rate is 18 blood pressure is 115/90 saturations 100%  Ventilator Settings on pressure support FiO2 40% pressure of 12/5  . General: Comfortable at this time . Eyes: Grossly normal lids, irises & conjunctiva . ENT: grossly tongue is normal . Neck: no obvious mass . Cardiovascular: S1 S2 normal no gallop . Respiratory: No rhonchi very coarse breath sounds . Abdomen: soft . Skin: no rash seen on limited exam . Musculoskeletal: not rigid . Psychiatric:unable to assess . Neurologic: no seizure no involuntary movements         Lab Data:   Basic Metabolic Panel: Recent Labs  Lab 06/06/20 0418 06/09/20 0619 06/12/20 0328  NA 136 139 137  K 3.9 4.0 4.4  CL 100 101 98  CO2 29 28 28   GLUCOSE 145* 115* 145*  BUN 12 15 14   CREATININE 0.33* 0.32* 0.32*  CALCIUM 8.9 8.7* 9.2    ABG: Recent Labs  Lab 06/05/20 2050  PHART 7.340*  PCO2ART 54.8*  PO2ART 131*  HCO3 28.6*  O2SAT 98.5    Liver Function Tests: Recent Labs  Lab 06/06/20 0418  AST 32  ALT 43  ALKPHOS 63  BILITOT 0.8  PROT 6.8  ALBUMIN 2.7*   No results for input(s): LIPASE, AMYLASE in the last 168 hours. No results for input(s): AMMONIA in the last 168 hours.  CBC: Recent Labs  Lab 06/06/20 0418 06/09/20 0619 06/12/20 0328  WBC 18.3* 7.4 9.9  NEUTROABS 14.8*  --   --   HGB 10.0* 9.5* 10.3*  HCT 31.5* 31.3* 34.0*  MCV 92.1 93.7 93.4  PLT 223 195 227    Cardiac Enzymes: No  results for input(s): CKTOTAL, CKMB, CKMBINDEX, TROPONINI in the last 168 hours.  BNP (last 3 results) No results for input(s): BNP in the last 8760 hours.  ProBNP (last 3 results) No results for input(s): PROBNP in the last 8760 hours.  Radiological Exams: No results found.  Assessment/Plan Active Problems:   Acute on chronic respiratory failure with hypoxia (HCC)   Acute inflammatory demyelinating polyneuropathy (HCC)   Dysautonomia (HCC)   Atrial fibrillation with RVR (HCC)   Healthcare associated bacterial pneumonia   1. Acute on chronic respiratory failure hypoxia we will continue with pressure support titrate oxygen as tolerated the goal today is for 4 hours 2. Acute inflammatory demyelinating polyneuropathy supportive care 3. Dysautonomia blood pressure better controlled 4. Atrial fibrillation rate controlled 5. Healthcare associated pneumonia has been treated we will continue to monitor   I have personally seen and evaluated the patient, evaluated laboratory and imaging results, formulated the assessment and plan and placed orders. The Patient requires high complexity decision making with multiple systems involvement.  Rounds were done with the Respiratory Therapy Director and Staff therapists and discussed with nursing staff also.  06/11/20, MD [facility] Pulmonary Critical Care Medicine Sleep Medicine

## 2020-06-13 DIAGNOSIS — G901 Familial dysautonomia [Riley-Day]: Secondary | ICD-10-CM | POA: Diagnosis not present

## 2020-06-13 DIAGNOSIS — I4891 Unspecified atrial fibrillation: Secondary | ICD-10-CM | POA: Diagnosis not present

## 2020-06-13 DIAGNOSIS — J9621 Acute and chronic respiratory failure with hypoxia: Secondary | ICD-10-CM | POA: Diagnosis not present

## 2020-06-13 DIAGNOSIS — G610 Guillain-Barre syndrome: Secondary | ICD-10-CM | POA: Diagnosis not present

## 2020-06-13 NOTE — Progress Notes (Signed)
Pulmonary Critical Care Medicine [facility] [place]   PULMONARY CRITICAL CARE SERVICE  PROGRESS NOTE  Date of Service: 06/13/2020  Kayin Osment  MGQ:[id number]  DOB: 04-Feb-1956   DOA: 06/05/2020  Referring Physician: Carron Curie, MD  HPI: Gregrey Bloyd is a 65 y.o. male seen for follow up of Acute on Chronic Respiratory Failure.  Patient is weaning on pressure support goal is 12 hours today  Medications: Reviewed on Rounds  Physical Exam:  Vitals: Temperature is 97.8 pulse 97 respiratory rate 20 blood pressure 109/61 saturations 100%  Ventilator Settings on pressure support FiO2 40% pressure 10/5  . General: Comfortable at this time . Eyes: Grossly normal lids, irises & conjunctiva . ENT: grossly tongue is normal . Neck: no obvious mass . Cardiovascular: S1 S2 normal no gallop . Respiratory: No rhonchi no rales . Abdomen: soft . Skin: no rash seen on limited exam . Musculoskeletal: not rigid . Psychiatric:unable to assess . Neurologic: no seizure no involuntary movements         Lab Data:   Basic Metabolic Panel: Recent Labs  Lab 06/09/20 0619 06/12/20 0328  NA 139 137  K 4.0 4.4  CL 101 98  CO2 28 28  GLUCOSE 115* 145*  BUN 15 14  CREATININE 0.32* 0.32*  CALCIUM 8.7* 9.2    ABG: No results for input(s): PHART, PCO2ART, PO2ART, HCO3, O2SAT in the last 168 hours.  Liver Function Tests: No results for input(s): AST, ALT, ALKPHOS, BILITOT, PROT, ALBUMIN in the last 168 hours. No results for input(s): LIPASE, AMYLASE in the last 168 hours. No results for input(s): AMMONIA in the last 168 hours.  CBC: Recent Labs  Lab 06/09/20 0619 06/12/20 0328  WBC 7.4 9.9  HGB 9.5* 10.3*  HCT 31.3* 34.0*  MCV 93.7 93.4  PLT 195 227    Cardiac Enzymes: No results for input(s): CKTOTAL, CKMB, CKMBINDEX, TROPONINI in the last 168 hours.  BNP (last 3 results) No results for input(s): BNP in the last 8760 hours.  ProBNP (last 3 results) No results for  input(s): PROBNP in the last 8760 hours.  Radiological Exams: No results found.  Assessment/Plan Active Problems:   Acute on chronic respiratory failure with hypoxia (HCC)   Acute inflammatory demyelinating polyneuropathy (HCC)   Dysautonomia (HCC)   Atrial fibrillation with RVR (HCC)   Healthcare associated bacterial pneumonia   1. Acute on chronic respiratory failure with hypoxia continue with pressure support mode titrate oxygen continue pulmonary toilet 2. AIDP supportive care therapy as tolerated 3. Dysautonomia being seen by cardiology follow blood pressures and heart rate 4. Atrial fibrillation rate controlled 5. Healthcare associated pneumonia treated   I have personally seen and evaluated the patient, evaluated laboratory and imaging results, formulated the assessment and plan and placed orders. The Patient requires high complexity decision making with multiple systems involvement.  Rounds were done with the Respiratory Therapy Director and Staff therapists and discussed with nursing staff also.  Yevonne Pax, MD [facility] Pulmonary Critical Care Medicine Sleep Medicine

## 2020-06-14 ENCOUNTER — Other Ambulatory Visit (HOSPITAL_COMMUNITY): Payer: Medicare Other

## 2020-06-14 DIAGNOSIS — I4891 Unspecified atrial fibrillation: Secondary | ICD-10-CM | POA: Diagnosis not present

## 2020-06-14 DIAGNOSIS — G901 Familial dysautonomia [Riley-Day]: Secondary | ICD-10-CM | POA: Diagnosis not present

## 2020-06-14 DIAGNOSIS — G610 Guillain-Barre syndrome: Secondary | ICD-10-CM | POA: Diagnosis not present

## 2020-06-14 DIAGNOSIS — J9621 Acute and chronic respiratory failure with hypoxia: Secondary | ICD-10-CM | POA: Diagnosis not present

## 2020-06-14 LAB — CBC
HCT: 36.6 % — ABNORMAL LOW (ref 39.0–52.0)
Hemoglobin: 11.7 g/dL — ABNORMAL LOW (ref 13.0–17.0)
MCH: 30.2 pg (ref 26.0–34.0)
MCHC: 32 g/dL (ref 30.0–36.0)
MCV: 94.6 fL (ref 80.0–100.0)
Platelets: 226 K/uL (ref 150–400)
RBC: 3.87 MIL/uL — ABNORMAL LOW (ref 4.22–5.81)
RDW: 16.6 % — ABNORMAL HIGH (ref 11.5–15.5)
WBC: 12.1 K/uL — ABNORMAL HIGH (ref 4.0–10.5)
nRBC: 0 % (ref 0.0–0.2)

## 2020-06-14 LAB — BASIC METABOLIC PANEL
Anion gap: 9 (ref 5–15)
BUN: 34 mg/dL — ABNORMAL HIGH (ref 8–23)
CO2: 32 mmol/L (ref 22–32)
Calcium: 9.5 mg/dL (ref 8.9–10.3)
Chloride: 100 mmol/L (ref 98–111)
Creatinine, Ser: 0.64 mg/dL (ref 0.61–1.24)
GFR, Estimated: >60 mL/min (ref >60)
Glucose, Bld: 148 mg/dL — ABNORMAL HIGH (ref 70–99)
Potassium: 4.2 mmol/L (ref 3.5–5.1)
Sodium: 141 mmol/L (ref 135–145)

## 2020-06-14 NOTE — Progress Notes (Signed)
Pulmonary Critical Care Medicine [facility] [place]   PULMONARY CRITICAL CARE SERVICE  PROGRESS NOTE  Date of Service: 06/14/2020  Brian Espinoza  KDT:[id number]  DOB: 12/23/1955   DOA: 06/05/2020  Referring Physician: Carron Curie, MD  HPI: Brian Espinoza is a 65 y.o. male seen for follow up of Acute on Chronic Respiratory Failure.  Patient is resting comfortably did about 4 hours of pressure support  Medications: Reviewed on Rounds  Physical Exam:  Vitals: Temperature is 98 pulse 90 respiratory rate 20 saturations 99%  Ventilator Settings on pressure control FiO2 45% PEEP 5 tidal volume 480  . General: Comfortable at this time . Eyes: Grossly normal lids, irises & conjunctiva . ENT: grossly tongue is normal . Neck: no obvious mass . Cardiovascular: S1 S2 normal no gallop . Respiratory: Rhonchi coarse breath sounds . Abdomen: soft . Skin: no rash seen on limited exam . Musculoskeletal: not rigid . Psychiatric:unable to assess . Neurologic: no seizure no involuntary movements         Lab Data:   Basic Metabolic Panel: Recent Labs  Lab 06/09/20 0619 06/12/20 0328 06/14/20 0833  NA 139 137 141  K 4.0 4.4 4.2  CL 101 98 100  CO2 28 28 32  GLUCOSE 115* 145* 148*  BUN 15 14 34*  CREATININE 0.32* 0.32* 0.64  CALCIUM 8.7* 9.2 9.5    ABG: No results for input(s): PHART, PCO2ART, PO2ART, HCO3, O2SAT in the last 168 hours.  Liver Function Tests: No results for input(s): AST, ALT, ALKPHOS, BILITOT, PROT, ALBUMIN in the last 168 hours. No results for input(s): LIPASE, AMYLASE in the last 168 hours. No results for input(s): AMMONIA in the last 168 hours.  CBC: Recent Labs  Lab 06/09/20 0619 06/12/20 0328 06/14/20 0833  WBC 7.4 9.9 12.1*  HGB 9.5* 10.3* 11.7*  HCT 31.3* 34.0* 36.6*  MCV 93.7 93.4 94.6  PLT 195 227 226    Cardiac Enzymes: No results for input(s): CKTOTAL, CKMB, CKMBINDEX, TROPONINI in the last 168 hours.  BNP (last 3 results) No  results for input(s): BNP in the last 8760 hours.  ProBNP (last 3 results) No results for input(s): PROBNP in the last 8760 hours.  Radiological Exams: DG CHEST PORT 1 VIEW  Result Date: 06/14/2020 CLINICAL DATA:  Respiratory failure. EXAM: PORTABLE CHEST 1 VIEW COMPARISON:  06/09/2020. FINDINGS: Tracheostomy tube, left PICC line stable position. Cardiomegaly. No pulmonary venous congestion. Persistent bibasilar atelectasis/infiltrates. Atelectasis in the right lung base has improved. Nodular opacity noted the right lung base may represent nipple shadow or focal atelectasis, this was not visualized on prior study. No pneumothorax. IMPRESSION: 1. Tracheostomy tube and left PICC line stable position. 2. Cardiomegaly. No pulmonary venous congestion. 3. Persistent bibasilar atelectasis/infiltrates. Atelectasis in the right lung base has improved. Electronically Signed   By: Maisie Fus  Register   On: 06/14/2020 06:59    Assessment/Plan Active Problems:   Acute on chronic respiratory failure with hypoxia (HCC)   Acute inflammatory demyelinating polyneuropathy (HCC)   Dysautonomia (HCC)   Atrial fibrillation with RVR (HCC)   Healthcare associated bacterial pneumonia   1. Acute on chronic respiratory failure with hypoxia we will continue to wean on pressure support mode as mentioned yesterday patient was able to do 4 hours on pressure support 2. Acute inflammatory demyelinating polyneuropathy supportive care physical therapy 3. Dysautonomia overall no change 4. Atrial fibrillation rate controlled 5. Healthcare associated pneumonia has been treated   I have personally seen and evaluated the patient, evaluated  laboratory and imaging results, formulated the assessment and plan and placed orders. The Patient requires high complexity decision making with multiple systems involvement.  Rounds were done with the Respiratory Therapy Director and Staff therapists and discussed with nursing staff  also.  Yevonne Pax, MD [facility] Pulmonary Critical Care Medicine Sleep Medicine

## 2020-06-15 DIAGNOSIS — G610 Guillain-Barre syndrome: Secondary | ICD-10-CM | POA: Diagnosis not present

## 2020-06-15 DIAGNOSIS — I4891 Unspecified atrial fibrillation: Secondary | ICD-10-CM | POA: Diagnosis not present

## 2020-06-15 DIAGNOSIS — G901 Familial dysautonomia [Riley-Day]: Secondary | ICD-10-CM | POA: Diagnosis not present

## 2020-06-15 DIAGNOSIS — J9621 Acute and chronic respiratory failure with hypoxia: Secondary | ICD-10-CM | POA: Diagnosis not present

## 2020-06-15 NOTE — Progress Notes (Signed)
Pulmonary Critical Care Medicine [facility] [place]   PULMONARY CRITICAL CARE SERVICE  PROGRESS NOTE  Date of Service: 06/15/2020  Bufford Helms  PJA:[id number]  DOB: 06/23/1955   DOA: 06/05/2020  Referring Physician: Carron Curie, MD  HPI: Talvin Christianson is a 65 y.o. male seen for follow up of Acute on Chronic Respiratory Failure.  Patient is comfortable right now without distress has been on pressure control mode on 50% FiO2  Medications: Reviewed on Rounds  Physical Exam:  Vitals: Temperature is 99.4 pulse 72 respiratory rate is 26 blood pressure is 129/76 saturations 100%  Ventilator Settings on pressure control FiO2 50% PEEP 5 IP 14  . General: Comfortable at this time . Eyes: Grossly normal lids, irises & conjunctiva . ENT: grossly tongue is normal . Neck: no obvious mass . Cardiovascular: S1 S2 normal no gallop . Respiratory: Scattered rhonchi expansion is equal . Abdomen: soft . Skin: no rash seen on limited exam . Musculoskeletal: not rigid . Psychiatric:unable to assess . Neurologic: no seizure no involuntary movements         Lab Data:   Basic Metabolic Panel: Recent Labs  Lab 06/09/20 0619 06/12/20 0328 06/14/20 0833  NA 139 137 141  K 4.0 4.4 4.2  CL 101 98 100  CO2 28 28 32  GLUCOSE 115* 145* 148*  BUN 15 14 34*  CREATININE 0.32* 0.32* 0.64  CALCIUM 8.7* 9.2 9.5    ABG: No results for input(s): PHART, PCO2ART, PO2ART, HCO3, O2SAT in the last 168 hours.  Liver Function Tests: No results for input(s): AST, ALT, ALKPHOS, BILITOT, PROT, ALBUMIN in the last 168 hours. No results for input(s): LIPASE, AMYLASE in the last 168 hours. No results for input(s): AMMONIA in the last 168 hours.  CBC: Recent Labs  Lab 06/09/20 0619 06/12/20 0328 06/14/20 0833  WBC 7.4 9.9 12.1*  HGB 9.5* 10.3* 11.7*  HCT 31.3* 34.0* 36.6*  MCV 93.7 93.4 94.6  PLT 195 227 226    Cardiac Enzymes: No results for input(s): CKTOTAL, CKMB, CKMBINDEX, TROPONINI  in the last 168 hours.  BNP (last 3 results) No results for input(s): BNP in the last 8760 hours.  ProBNP (last 3 results) No results for input(s): PROBNP in the last 8760 hours.  Radiological Exams: DG CHEST PORT 1 VIEW  Result Date: 06/14/2020 CLINICAL DATA:  Respiratory failure. EXAM: PORTABLE CHEST 1 VIEW COMPARISON:  06/09/2020. FINDINGS: Tracheostomy tube, left PICC line stable position. Cardiomegaly. No pulmonary venous congestion. Persistent bibasilar atelectasis/infiltrates. Atelectasis in the right lung base has improved. Nodular opacity noted the right lung base may represent nipple shadow or focal atelectasis, this was not visualized on prior study. No pneumothorax. IMPRESSION: 1. Tracheostomy tube and left PICC line stable position. 2. Cardiomegaly. No pulmonary venous congestion. 3. Persistent bibasilar atelectasis/infiltrates. Atelectasis in the right lung base has improved. Electronically Signed   By: Maisie Fus  Register   On: 06/14/2020 06:59    Assessment/Plan Active Problems:   Acute on chronic respiratory failure with hypoxia (HCC)   Acute inflammatory demyelinating polyneuropathy (HCC)   Dysautonomia (HCC)   Atrial fibrillation with RVR (HCC)   Healthcare associated bacterial pneumonia   1. Acute on chronic respiratory failure with hypoxia patient is currently on pressure control mode has been on IP of 14 with a PEEP 5 not tolerating weaning 2. Acute inflammatory demyelinating polyneuropathy patient is at baseline 3. Dysautonomia no changes 4. Atrial fibrillation rate controlled 5. Healthcare associated pneumonia has been treated with antibiotics  I have personally seen and evaluated the patient, evaluated laboratory and imaging results, formulated the assessment and plan and placed orders. The Patient requires high complexity decision making with multiple systems involvement.  Rounds were done with the Respiratory Therapy Director and Staff therapists and discussed  with nursing staff also.  Yevonne Pax, MD [facility] Pulmonary Critical Care Medicine Sleep Medicine

## 2020-06-16 DIAGNOSIS — G610 Guillain-Barre syndrome: Secondary | ICD-10-CM | POA: Diagnosis not present

## 2020-06-16 DIAGNOSIS — I4891 Unspecified atrial fibrillation: Secondary | ICD-10-CM | POA: Diagnosis not present

## 2020-06-16 DIAGNOSIS — G901 Familial dysautonomia [Riley-Day]: Secondary | ICD-10-CM | POA: Diagnosis not present

## 2020-06-16 DIAGNOSIS — J9621 Acute and chronic respiratory failure with hypoxia: Secondary | ICD-10-CM | POA: Diagnosis not present

## 2020-06-16 LAB — CULTURE, RESPIRATORY W GRAM STAIN

## 2020-06-16 NOTE — Progress Notes (Signed)
Pulmonary Critical Care Medicine [facility] [place]   PULMONARY CRITICAL CARE SERVICE  PROGRESS NOTE  Date of Service: 06/16/2020  Brian Espinoza  ZOX:[id number]  DOB: [birth year]   DOA: 06/05/2020  Referring Physician: Carron Curie, MD  HPI: Brian Espinoza is a 65 y.o. male seen for follow up of Acute on Chronic Respiratory Failure.  Patient is on pressure support currently at 12/5  Medications: Reviewed on Rounds  Physical Exam:  Vitals: Temperature is 98.9 pulse 98 respiratory rate 30 blood pressure is 156/82 saturations 95%  Ventilator Settings on pressure support ventilation pressure 12/5  . General: Comfortable at this time . Eyes: Grossly normal lids, irises & conjunctiva . ENT: grossly tongue is normal . Neck: no obvious mass . Cardiovascular: S1 S2 normal no gallop . Respiratory: Scattered rhonchi expansion is equal . Abdomen: soft . Skin: no rash seen on limited exam . Musculoskeletal: not rigid . Psychiatric:unable to assess . Neurologic: no seizure no involuntary movements         Lab Data:   Basic Metabolic Panel: Recent Labs  Lab 06/12/20 0328 06/14/20 0833  NA 137 141  K 4.4 4.2  CL 98 100  CO2 28 32  GLUCOSE 145* 148*  BUN 14 34*  CREATININE 0.32* 0.64  CALCIUM 9.2 9.5    ABG: No results for input(s): PHART, PCO2ART, PO2ART, HCO3, O2SAT in the last 168 hours.  Liver Function Tests: No results for input(s): AST, ALT, ALKPHOS, BILITOT, PROT, ALBUMIN in the last 168 hours. No results for input(s): LIPASE, AMYLASE in the last 168 hours. No results for input(s): AMMONIA in the last 168 hours.  CBC: Recent Labs  Lab 06/12/20 0328 06/14/20 0833  WBC 9.9 12.1*  HGB 10.3* 11.7*  HCT 34.0* 36.6*  MCV 93.4 94.6  PLT 227 226    Cardiac Enzymes: No results for input(s): CKTOTAL, CKMB, CKMBINDEX, TROPONINI in the last 168 hours.  BNP (last 3 results) No results for input(s): BNP in the last 8760 hours.  ProBNP (last 3 results) No  results for input(s): PROBNP in the last 8760 hours.  Radiological Exams: No results found.  Assessment/Plan Active Problems:   Acute on chronic respiratory failure with hypoxia (HCC)   Acute inflammatory demyelinating polyneuropathy (HCC)   Dysautonomia (HCC)   Atrial fibrillation with RVR (HCC)   Healthcare associated bacterial pneumonia   1. Acute on chronic respiratory failure with hypoxia we will continue with the pressure support wean 2. Acute inflammatory demyelinating polyneuropathy no change we will continue with supportive care 3. Dysautonomia at baseline 4. Atrial fibrillation rate is controlled 5. Healthcare associated pneumonia treated slowly improved   I have personally seen and evaluated the patient, evaluated laboratory and imaging results, formulated the assessment and plan and placed orders. The Patient requires high complexity decision making with multiple systems involvement.  Rounds were done with the Respiratory Therapy Director and Staff therapists and discussed with nursing staff also.  Yevonne Pax, MD [facility] Pulmonary Critical Care Medicine Sleep Medicine

## 2020-06-17 DIAGNOSIS — G901 Familial dysautonomia [Riley-Day]: Secondary | ICD-10-CM | POA: Diagnosis not present

## 2020-06-17 DIAGNOSIS — I4891 Unspecified atrial fibrillation: Secondary | ICD-10-CM | POA: Diagnosis not present

## 2020-06-17 DIAGNOSIS — G610 Guillain-Barre syndrome: Secondary | ICD-10-CM | POA: Diagnosis not present

## 2020-06-17 DIAGNOSIS — J9621 Acute and chronic respiratory failure with hypoxia: Secondary | ICD-10-CM | POA: Diagnosis not present

## 2020-06-17 NOTE — Progress Notes (Signed)
Pulmonary Critical Care Medicine [facility] [place]   PULMONARY CRITICAL CARE SERVICE  PROGRESS NOTE  Date of Service: 06/17/2020  Brian Espinoza  [id number]:[id number]  DOB: 1955/07/27   DOA: 06/05/2020  Referring Physician: Carron Curie, MD  HPI: Brian Espinoza is a 65 y.o. male seen for follow up of Acute on Chronic Respiratory Failure.  Patient at this time is on pressure support has been on 12/5 good saturations are noted at this time  Medications: Reviewed on Rounds  Physical Exam:  Vitals: Temperature 96.8 pulse 90 respiratory rate is 32 blood pressure is 155/83 saturations 93%  Ventilator Settings on pressure support FiO2 40% pressure 12/5  . General: Comfortable at this time . Eyes: Grossly normal lids, irises & conjunctiva . ENT: grossly tongue is normal . Neck: no obvious mass . Cardiovascular: S1 S2 normal no gallop . Respiratory: Scattered rhonchi expansion is equal . Abdomen: soft . Skin: no rash seen on limited exam . Musculoskeletal: not rigid . Psychiatric:unable to assess . Neurologic: no seizure no involuntary movements         Lab Data:   Basic Metabolic Panel: Recent Labs  Lab 06/12/20 0328 06/14/20 0833  NA 137 141  K 4.4 4.2  CL 98 100  CO2 28 32  GLUCOSE 145* 148*  BUN 14 34*  CREATININE 0.32* 0.64  CALCIUM 9.2 9.5    ABG: No results for input(s): PHART, PCO2ART, PO2ART, HCO3, O2SAT in the last 168 hours.  Liver Function Tests: No results for input(s): AST, ALT, ALKPHOS, BILITOT, PROT, ALBUMIN in the last 168 hours. No results for input(s): LIPASE, AMYLASE in the last 168 hours. No results for input(s): AMMONIA in the last 168 hours.  CBC: Recent Labs  Lab 06/12/20 0328 06/14/20 0833  WBC 9.9 12.1*  HGB 10.3* 11.7*  HCT 34.0* 36.6*  MCV 93.4 94.6  PLT 227 226    Cardiac Enzymes: No results for input(s): CKTOTAL, CKMB, CKMBINDEX, TROPONINI in the last 168 hours.  BNP (last 3 results) No results for input(s): BNP in the  last 8760 hours.  ProBNP (last 3 results) No results for input(s): PROBNP in the last 8760 hours.  Radiological Exams: No results found.  Assessment/Plan Active Problems:   Acute on chronic respiratory failure with hypoxia (HCC)   Acute inflammatory demyelinating polyneuropathy (HCC)   Dysautonomia (HCC)   Atrial fibrillation with RVR (HCC)   Healthcare associated bacterial pneumonia   1. Acute on chronic respiratory failure hypoxia we will continue with on pressure support titrate oxygen continue pulm toilet. 2. Acute inflammatory demyelinating polyneuropathy no change we will continue to follow 3. Dysautonomia seems to be more stable 4. Atrial fibrillation rate is controlled 5. Healthcare associated pneumonia has been treated   I have personally seen and evaluated the patient, evaluated laboratory and imaging results, formulated the assessment and plan and placed orders. The Patient requires high complexity decision making with multiple systems involvement.  Rounds were done with the Respiratory Therapy Director and Staff therapists and discussed with nursing staff also.  Yevonne Pax, MD [facility] Pulmonary Critical Care Medicine Sleep Medicine

## 2020-06-18 DIAGNOSIS — I4891 Unspecified atrial fibrillation: Secondary | ICD-10-CM | POA: Diagnosis not present

## 2020-06-18 DIAGNOSIS — J9621 Acute and chronic respiratory failure with hypoxia: Secondary | ICD-10-CM | POA: Diagnosis not present

## 2020-06-18 DIAGNOSIS — G901 Familial dysautonomia [Riley-Day]: Secondary | ICD-10-CM | POA: Diagnosis not present

## 2020-06-18 DIAGNOSIS — G610 Guillain-Barre syndrome: Secondary | ICD-10-CM | POA: Diagnosis not present

## 2020-06-18 LAB — CBC
HCT: 38.4 % — ABNORMAL LOW (ref 39.0–52.0)
Hemoglobin: 12.2 g/dL — ABNORMAL LOW (ref 13.0–17.0)
MCH: 30.3 pg (ref 26.0–34.0)
MCHC: 31.8 g/dL (ref 30.0–36.0)
MCV: 95.3 fL (ref 80.0–100.0)
Platelets: 192 10*3/uL (ref 150–400)
RBC: 4.03 MIL/uL — ABNORMAL LOW (ref 4.22–5.81)
RDW: 17.8 % — ABNORMAL HIGH (ref 11.5–15.5)
WBC: 9 10*3/uL (ref 4.0–10.5)
nRBC: 0 % (ref 0.0–0.2)

## 2020-06-18 LAB — BASIC METABOLIC PANEL
Anion gap: 10 (ref 5–15)
BUN: 32 mg/dL — ABNORMAL HIGH (ref 8–23)
CO2: 31 mmol/L (ref 22–32)
Calcium: 9.8 mg/dL (ref 8.9–10.3)
Chloride: 99 mmol/L (ref 98–111)
Creatinine, Ser: 0.47 mg/dL — ABNORMAL LOW (ref 0.61–1.24)
GFR, Estimated: >60 mL/min (ref >60)
Glucose, Bld: 206 mg/dL — ABNORMAL HIGH (ref 70–99)
Potassium: 5 mmol/L (ref 3.5–5.1)
Sodium: 140 mmol/L (ref 135–145)

## 2020-06-18 NOTE — Progress Notes (Signed)
Pulmonary Critical Care Medicine [facility] [place]   PULMONARY CRITICAL CARE SERVICE  PROGRESS NOTE  Date of Service: 06/18/2020  Brian Espinoza  DZH:[id number]  DOB: 04-May-1956   DOA: 06/05/2020  Referring Physician: Carron Curie, MD  HPI: Brian Espinoza is a 65 y.o. male seen for follow up of Acute on Chronic Respiratory Failure.  Patient currently is on T collar has been on 40% FiO2 did 16 hours of pressure support yesterday so today will be advancing on T-piece for least 2 hours  Medications: Reviewed on Rounds  Physical Exam:  Vitals: Temperature 96.7 pulse 51 respiratory rate is 12 blood pressure 142/77 saturations 99%  Ventilator Settings on T collar FiO2 40%  . General: Comfortable at this time . Eyes: Grossly normal lids, irises & conjunctiva . ENT: grossly tongue is normal . Neck: no obvious mass . Cardiovascular: S1 S2 normal no gallop . Respiratory: Scattered coarse rhonchi . Abdomen: soft . Skin: no rash seen on limited exam . Musculoskeletal: not rigid . Psychiatric:unable to assess . Neurologic: no seizure no involuntary movements         Lab Data:   Basic Metabolic Panel: Recent Labs  Lab 06/12/20 0328 06/14/20 0833 06/18/20 0417  NA 137 141 140  K 4.4 4.2 5.0  CL 98 100 99  CO2 28 32 31  GLUCOSE 145* 148* 206*  BUN 14 34* 32*  CREATININE 0.32* 0.64 0.47*  CALCIUM 9.2 9.5 9.8    ABG: No results for input(s): PHART, PCO2ART, PO2ART, HCO3, O2SAT in the last 168 hours.  Liver Function Tests: No results for input(s): AST, ALT, ALKPHOS, BILITOT, PROT, ALBUMIN in the last 168 hours. No results for input(s): LIPASE, AMYLASE in the last 168 hours. No results for input(s): AMMONIA in the last 168 hours.  CBC: Recent Labs  Lab 06/12/20 0328 06/14/20 0833 06/18/20 0417  WBC 9.9 12.1* 9.0  HGB 10.3* 11.7* 12.2*  HCT 34.0* 36.6* 38.4*  MCV 93.4 94.6 95.3  PLT 227 226 192    Cardiac Enzymes: No results for input(s): CKTOTAL, CKMB,  CKMBINDEX, TROPONINI in the last 168 hours.  BNP (last 3 results) No results for input(s): BNP in the last 8760 hours.  ProBNP (last 3 results) No results for input(s): PROBNP in the last 8760 hours.  Radiological Exams: No results found.  Assessment/Plan Active Problems:   Acute on chronic respiratory failure with hypoxia (HCC)   Acute inflammatory demyelinating polyneuropathy (HCC)   Dysautonomia (HCC)   Atrial fibrillation with RVR (HCC)   Healthcare associated bacterial pneumonia   1. Acute on chronic respiratory failure hypoxia plan is to continue with the T collar trials wean as tolerated 2. Acute demyelinating polyneuropathy no change we will continue to monitor 3. Dysautonomia no changes 4. Atrial fibrillation with rapid ventricular response patient is at baseline 5. Healthcare associated pneumonia treated slowly improved   I have personally seen and evaluated the patient, evaluated laboratory and imaging results, formulated the assessment and plan and placed orders. The Patient requires high complexity decision making with multiple systems involvement.  Rounds were done with the Respiratory Therapy Director and Staff therapists and discussed with nursing staff also.  Yevonne Pax, MD [facility] Pulmonary Critical Care Medicine Sleep Medicine

## 2020-06-18 NOTE — Consult Note (Signed)
Ref: Elana Alm, MD   Subjective:  Low heart rate at night.  Patient remains sedated and poorly responding.  Objective:  Vital Signs in the last 24 hours: P: 70, R: 18, BP 153/87, O2 sat 93 % on 40 % O2 and spontaneous breathing.   Physical Exam: BP Readings from Last 1 Encounters:  05/06/19 124/79     Wt Readings from Last 1 Encounters:  04/04/14 101.2 kg    Weight change:  There is no height or weight on file to calculate BMI. HEENT: [place]/AT, Eyes-Blue, Conjunctiva-Pale, Sclera-Non-icteric Neck: No JVD, No bruit, Trachea midline. Lungs:  Clear, Bilateral. Cardiac:  Regular rhythm, normal S1 and S2, no S3. II/VI systolic murmur. Abdomen:  Soft, non-tender. BS present. Extremities:  Trace edema present. No cyanosis. No clubbing. CNS: AxOx0, Spontaneous jerky movements of head and upper body. Cranial nerves grossly intact.  Skin: Warm and dry.   Intake/Output from previous day: No intake/output data recorded.    Lab Results: BMET    Component Value Date/Time   NA 140 06/18/2020 0417   NA 141 06/14/2020 0833   NA 137 06/12/2020 0328   K 5.0 06/18/2020 0417   K 4.2 06/14/2020 0833   K 4.4 06/12/2020 0328   CL 99 06/18/2020 0417   CL 100 06/14/2020 0833   CL 98 06/12/2020 0328   CO2 31 06/18/2020 0417   CO2 32 06/14/2020 0833   CO2 28 06/12/2020 0328   GLUCOSE 206 (H) 06/18/2020 0417   GLUCOSE 148 (H) 06/14/2020 0833   GLUCOSE 145 (H) 06/12/2020 0328   BUN 32 (H) 06/18/2020 0417   BUN 34 (H) 06/14/2020 0833   BUN 14 06/12/2020 0328   CREATININE 0.47 (L) 06/18/2020 0417   CREATININE 0.64 06/14/2020 0833   CREATININE 0.32 (L) 06/12/2020 0328   CALCIUM 9.8 06/18/2020 0417   CALCIUM 9.5 06/14/2020 0833   CALCIUM 9.2 06/12/2020 0328   GFRNONAA >60 06/18/2020 0417   GFRNONAA >60 06/14/2020 0833   GFRNONAA >60 06/12/2020 0328   CBC    Component Value Date/Time   WBC 9.0 06/18/2020 0417   RBC 4.03 (L) 06/18/2020 0417   HGB 12.2 (L) 06/18/2020 0417   HCT  38.4 (L) 06/18/2020 0417   PLT 192 06/18/2020 0417   MCV 95.3 06/18/2020 0417   MCH 30.3 06/18/2020 0417   MCHC 31.8 06/18/2020 0417   RDW 17.8 (H) 06/18/2020 0417   LYMPHSABS 2.3 06/06/2020 0418   MONOABS 0.9 06/06/2020 0418   EOSABS 0.0 06/06/2020 0418   BASOSABS 0.1 06/06/2020 0418   HEPATIC Function Panel Recent Labs    06/06/20 0418  PROT 6.8   HEMOGLOBIN A1C No components found for: HGA1C,  MPG CARDIAC ENZYMES No results found for: CKTOTAL, CKMB, CKMBINDEX, TROPONINI BNP No results for input(s): PROBNP in the last 8760 hours. TSH Recent Labs    06/06/20 0418  TSH 3.257   CHOLESTEROL No results for input(s): CHOL in the last 8760 hours.  Scheduled Meds: Continuous Infusions: PRN Meds:.  Assessment/Plan: Acute on chronic respiratory failure with hypoxia Paroxysmal atrial fibrillation Anemia of chronic blood loss CAD Metabolic encephalopathy A/P acute demyelinating polyneuropathy S/P tracheostomy Type 2 DM S/P PEG placement S/P pneumonia  Decrease amiodarone to 100 mg. daily,   LOS: 0 days   Time spent including chart review, lab review, examination, discussion with patient/nurse/Physician : 30 min   Orpah Cobb  MD  06/18/2020, 11:09 AM

## 2020-06-19 DIAGNOSIS — G901 Familial dysautonomia [Riley-Day]: Secondary | ICD-10-CM | POA: Diagnosis not present

## 2020-06-19 DIAGNOSIS — G610 Guillain-Barre syndrome: Secondary | ICD-10-CM | POA: Diagnosis not present

## 2020-06-19 DIAGNOSIS — I4891 Unspecified atrial fibrillation: Secondary | ICD-10-CM | POA: Diagnosis not present

## 2020-06-19 DIAGNOSIS — J9621 Acute and chronic respiratory failure with hypoxia: Secondary | ICD-10-CM | POA: Diagnosis not present

## 2020-06-19 NOTE — Progress Notes (Signed)
Pulmonary Critical Care Medicine [facility] [place]   PULMONARY CRITICAL CARE SERVICE  PROGRESS NOTE  Date of Service: 06/19/2020  Brian Espinoza  GEX:[id number]  DOB: Mar 22, 1956   DOA: 06/05/2020  Referring Physician: Carron Curie, MD  HPI: Brian Espinoza is a 65 y.o. male seen for follow up of Acute on Chronic Respiratory Failure.  Patient at this time is on 40% FiO2 on pressure support the goal is for 16 hours today  Medications: Reviewed on Rounds  Physical Exam:  Vitals: Temperature is 98.9 pulse of 71 respiratory 18 blood pressure is 135/60 saturations 96%  Ventilator Settings on pressure support FiO2 40% pressure of 10/5  . General: Comfortable at this time . Eyes: Grossly normal lids, irises & conjunctiva . ENT: grossly tongue is normal . Neck: no obvious mass . Cardiovascular: S1 S2 normal no gallop . Respiratory: Scattered rhonchi expansion is equal . Abdomen: soft . Skin: no rash seen on limited exam . Musculoskeletal: not rigid . Psychiatric:unable to assess . Neurologic: no seizure no involuntary movements         Lab Data:   Basic Metabolic Panel: Recent Labs  Lab 06/14/20 0833 06/18/20 0417  NA 141 140  K 4.2 5.0  CL 100 99  CO2 32 31  GLUCOSE 148* 206*  BUN 34* 32*  CREATININE 0.64 0.47*  CALCIUM 9.5 9.8    ABG: No results for input(s): PHART, PCO2ART, PO2ART, HCO3, O2SAT in the last 168 hours.  Liver Function Tests: No results for input(s): AST, ALT, ALKPHOS, BILITOT, PROT, ALBUMIN in the last 168 hours. No results for input(s): LIPASE, AMYLASE in the last 168 hours. No results for input(s): AMMONIA in the last 168 hours.  CBC: Recent Labs  Lab 06/14/20 0833 06/18/20 0417  WBC 12.1* 9.0  HGB 11.7* 12.2*  HCT 36.6* 38.4*  MCV 94.6 95.3  PLT 226 192    Cardiac Enzymes: No results for input(s): CKTOTAL, CKMB, CKMBINDEX, TROPONINI in the last 168 hours.  BNP (last 3 results) No results for input(s): BNP in the last 8760  hours.  ProBNP (last 3 results) No results for input(s): PROBNP in the last 8760 hours.  Radiological Exams: No results found.  Assessment/Plan Active Problems:   Acute on chronic respiratory failure with hypoxia (HCC)   Acute inflammatory demyelinating polyneuropathy (HCC)   Dysautonomia (HCC)   Atrial fibrillation with RVR (HCC)   Healthcare associated bacterial pneumonia   1. Acute on chronic respiratory failure hypoxia we will continue with pressure support titrate oxygen continue pulmonary toilet. 2. Acute inflammatory demyelinating polyneuropathy no change we will continue to follow 3. Dysautonomia patient is at baseline 4. Atrial fibrillation rate is controlled 5. Healthcare associated pneumonia treated we will continue to follow   I have personally seen and evaluated the patient, evaluated laboratory and imaging results, formulated the assessment and plan and placed orders. The Patient requires high complexity decision making with multiple systems involvement.  Rounds were done with the Respiratory Therapy Director and Staff therapists and discussed with nursing staff also.  Yevonne Pax, MD [facility] Pulmonary Critical Care Medicine Sleep Medicine

## 2020-06-20 DIAGNOSIS — G610 Guillain-Barre syndrome: Secondary | ICD-10-CM | POA: Diagnosis not present

## 2020-06-20 DIAGNOSIS — G901 Familial dysautonomia [Riley-Day]: Secondary | ICD-10-CM | POA: Diagnosis not present

## 2020-06-20 DIAGNOSIS — J9621 Acute and chronic respiratory failure with hypoxia: Secondary | ICD-10-CM | POA: Diagnosis not present

## 2020-06-20 DIAGNOSIS — I4891 Unspecified atrial fibrillation: Secondary | ICD-10-CM | POA: Diagnosis not present

## 2020-06-20 LAB — BASIC METABOLIC PANEL
Anion gap: 10 (ref 5–15)
BUN: 27 mg/dL — ABNORMAL HIGH (ref 8–23)
CO2: 29 mmol/L (ref 22–32)
Calcium: 9.2 mg/dL (ref 8.9–10.3)
Chloride: 95 mmol/L — ABNORMAL LOW (ref 98–111)
Creatinine, Ser: 0.41 mg/dL — ABNORMAL LOW (ref 0.61–1.24)
GFR, Estimated: >60 mL/min (ref >60)
Glucose, Bld: 166 mg/dL — ABNORMAL HIGH (ref 70–99)
Potassium: 4.2 mmol/L (ref 3.5–5.1)
Sodium: 134 mmol/L — ABNORMAL LOW (ref 135–145)

## 2020-06-20 NOTE — Progress Notes (Signed)
Pulmonary Critical Care Medicine [facility] [place]   PULMONARY CRITICAL CARE SERVICE  PROGRESS NOTE  Date of Service: 06/20/2020  Brian Espinoza  GUR:[id number]  DOB: Aug 24, 1955   DOA: 06/05/2020  Referring Physician: Carron Curie, MD  HPI: Brian Espinoza is a 65 y.o. male seen for follow up of Acute on Chronic Respiratory Failure.  Patient currently is on pressure control has been comfortable right now without distress yesterday was able to do 8 hours of T collar  Medications: Reviewed on Rounds  Physical Exam:  Vitals: Temperature is 97.8 pulse 73 respiratory rate 21 blood pressure is 155/66 saturations 97%  Ventilator Settings on pressure control FiO2 40% IP 14 PEEP 5  . General: Comfortable at this time . Eyes: Grossly normal lids, irises & conjunctiva . ENT: grossly tongue is normal . Neck: no obvious mass . Cardiovascular: S1 S2 normal no gallop . Respiratory: Scattered rhonchi expansion equal . Abdomen: soft . Skin: no rash seen on limited exam . Musculoskeletal: not rigid . Psychiatric:unable to assess . Neurologic: no seizure no involuntary movements         Lab Data:   Basic Metabolic Panel: Recent Labs  Lab 06/14/20 0833 06/18/20 0417 06/20/20 0521  NA 141 140 134*  K 4.2 5.0 4.2  CL 100 99 95*  CO2 32 31 29  GLUCOSE 148* 206* 166*  BUN 34* 32* 27*  CREATININE 0.64 0.47* 0.41*  CALCIUM 9.5 9.8 9.2    ABG: No results for input(s): PHART, PCO2ART, PO2ART, HCO3, O2SAT in the last 168 hours.  Liver Function Tests: No results for input(s): AST, ALT, ALKPHOS, BILITOT, PROT, ALBUMIN in the last 168 hours. No results for input(s): LIPASE, AMYLASE in the last 168 hours. No results for input(s): AMMONIA in the last 168 hours.  CBC: Recent Labs  Lab 06/14/20 0833 06/18/20 0417  WBC 12.1* 9.0  HGB 11.7* 12.2*  HCT 36.6* 38.4*  MCV 94.6 95.3  PLT 226 192    Cardiac Enzymes: No results for input(s): CKTOTAL, CKMB, CKMBINDEX, TROPONINI in the  last 168 hours.  BNP (last 3 results) No results for input(s): BNP in the last 8760 hours.  ProBNP (last 3 results) No results for input(s): PROBNP in the last 8760 hours.  Radiological Exams: No results found.  Assessment/Plan Active Problems:   Acute on chronic respiratory failure with hypoxia (HCC)   Acute inflammatory demyelinating polyneuropathy (HCC)   Dysautonomia (HCC)   Atrial fibrillation with RVR (HCC)   Healthcare associated bacterial pneumonia   1. Acute on chronic respiratory failure hypoxia we will continue with pressure control mode titrate oxygen continue pulmonary toilet. 2. Acute inflammatory demyelinating polyneuropathy supportive care 3. Dysautonomia appears to be more stable 4. Atrial fibrillation rate controlled cardiology following 5. Healthcare associated pneumonia treated slow improved   I have personally seen and evaluated the patient, evaluated laboratory and imaging results, formulated the assessment and plan and placed orders. The Patient requires high complexity decision making with multiple systems involvement.  Rounds were done with the Respiratory Therapy Director and Staff therapists and discussed with nursing staff also.  Yevonne Pax, MD [facility] Pulmonary Critical Care Medicine Sleep Medicine

## 2020-06-22 DIAGNOSIS — G610 Guillain-Barre syndrome: Secondary | ICD-10-CM | POA: Diagnosis not present

## 2020-06-22 DIAGNOSIS — I4891 Unspecified atrial fibrillation: Secondary | ICD-10-CM | POA: Diagnosis not present

## 2020-06-22 DIAGNOSIS — G901 Familial dysautonomia [Riley-Day]: Secondary | ICD-10-CM | POA: Diagnosis not present

## 2020-06-22 DIAGNOSIS — J9621 Acute and chronic respiratory failure with hypoxia: Secondary | ICD-10-CM | POA: Diagnosis not present

## 2020-06-22 NOTE — Progress Notes (Signed)
Pulmonary Critical Care Medicine [facility] [place]   PULMONARY CRITICAL CARE SERVICE  PROGRESS NOTE  Date of Service: 06/22/2020  Brian Espinoza  WGN:[id number]  DOB: [birth year]   DOA: 06/05/2020  Referring Physician: Carron Curie, MD  HPI: Brian Espinoza is a 65 y.o. male seen for follow up of Acute on Chronic Respiratory Failure.  Patient is on T collar 40% FiO2 the goal is for 16 hours today  Medications: Reviewed on Rounds  Physical Exam:  Vitals: Temperature 98.7 pulse 62 respiratory rate 17 blood pressure is 132/87 saturations 100%  Ventilator Settings off the ventilator on T collar currently on 40% FiO2   General: Comfortable at this time  Eyes: Grossly normal lids, irises & conjunctiva  ENT: grossly tongue is normal  Neck: no obvious mass  Cardiovascular: S1 S2 normal no gallop  Respiratory: Scattered rhonchi expansion is equal  Abdomen: soft  Skin: no rash seen on limited exam  Musculoskeletal: not rigid  Psychiatric:unable to assess  Neurologic: no seizure no involuntary movements         Lab Data:   Basic Metabolic Panel: Recent Labs  Lab 06/18/20 0417 06/20/20 0521  NA 140 134*  K 5.0 4.2  CL 99 95*  CO2 31 29  GLUCOSE 206* 166*  BUN 32* 27*  CREATININE 0.47* 0.41*  CALCIUM 9.8 9.2    ABG: No results for input(s): PHART, PCO2ART, PO2ART, HCO3, O2SAT in the last 168 hours.  Liver Function Tests: No results for input(s): AST, ALT, ALKPHOS, BILITOT, PROT, ALBUMIN in the last 168 hours. No results for input(s): LIPASE, AMYLASE in the last 168 hours. No results for input(s): AMMONIA in the last 168 hours.  CBC: Recent Labs  Lab 06/18/20 0417  WBC 9.0  HGB 12.2*  HCT 38.4*  MCV 95.3  PLT 192    Cardiac Enzymes: No results for input(s): CKTOTAL, CKMB, CKMBINDEX, TROPONINI in the last 168 hours.  BNP (last 3 results) No results for input(s): BNP in the last 8760 hours.  ProBNP (last 3 results) No results for input(s):  PROBNP in the last 8760 hours.  Radiological Exams: No results found.  Assessment/Plan Active Problems:   Acute on chronic respiratory failure with hypoxia (HCC)   Acute inflammatory demyelinating polyneuropathy (HCC)   Dysautonomia (HCC)   Atrial fibrillation with RVR (HCC)   Healthcare associated bacterial pneumonia   1. Acute on chronic respiratory failure hypoxia continue with T collar titrate oxygen continue pulmonary toilet.  Patient currently requiring 40% FiO2. 2. Acute inflammatory demyelinating polyneuropathy no change 3. Dysautonomia patient is at baseline 4. Atrial fibrillation with RVR rate controlled 5. Healthcare associated pneumonia treated slowly improving   I have personally seen and evaluated the patient, evaluated laboratory and imaging results, formulated the assessment and plan and placed orders. The Patient requires high complexity decision making with multiple systems involvement.  Rounds were done with the Respiratory Therapy Director and Staff therapists and discussed with nursing staff also.  Yevonne Pax, MD [facility] Pulmonary Critical Care Medicine Sleep Medicine

## 2020-06-23 DIAGNOSIS — J9621 Acute and chronic respiratory failure with hypoxia: Secondary | ICD-10-CM | POA: Diagnosis not present

## 2020-06-23 DIAGNOSIS — I4891 Unspecified atrial fibrillation: Secondary | ICD-10-CM | POA: Diagnosis not present

## 2020-06-23 DIAGNOSIS — G610 Guillain-Barre syndrome: Secondary | ICD-10-CM | POA: Diagnosis not present

## 2020-06-23 DIAGNOSIS — G901 Familial dysautonomia [Riley-Day]: Secondary | ICD-10-CM | POA: Diagnosis not present

## 2020-06-23 LAB — BLOOD GAS, ARTERIAL
Acid-Base Excess: 5.8 mmol/L — ABNORMAL HIGH (ref 0.0–2.0)
Bicarbonate: 30 mmol/L — ABNORMAL HIGH (ref 20.0–28.0)
FIO2: 35
O2 Saturation: 98.5 %
Patient temperature: 37
pCO2 arterial: 46 mmHg (ref 32.0–48.0)
pH, Arterial: 7.43 (ref 7.350–7.450)
pO2, Arterial: 110 mmHg — ABNORMAL HIGH (ref 83.0–108.0)

## 2020-06-23 LAB — BASIC METABOLIC PANEL
Anion gap: 11 (ref 5–15)
BUN: 20 mg/dL (ref 8–23)
CO2: 29 mmol/L (ref 22–32)
Calcium: 9.3 mg/dL (ref 8.9–10.3)
Chloride: 99 mmol/L (ref 98–111)
Creatinine, Ser: 0.3 mg/dL — ABNORMAL LOW (ref 0.61–1.24)
GFR, Estimated: >60 mL/min (ref >60)
Glucose, Bld: 172 mg/dL — ABNORMAL HIGH (ref 70–99)
Potassium: 4 mmol/L (ref 3.5–5.1)
Sodium: 139 mmol/L (ref 135–145)

## 2020-06-23 LAB — CBC
HCT: 41.7 % (ref 39.0–52.0)
Hemoglobin: 13 g/dL (ref 13.0–17.0)
MCH: 29.3 pg (ref 26.0–34.0)
MCHC: 31.2 g/dL (ref 30.0–36.0)
MCV: 93.9 fL (ref 80.0–100.0)
Platelets: 124 10*3/uL — ABNORMAL LOW (ref 150–400)
RBC: 4.44 MIL/uL (ref 4.22–5.81)
RDW: 18.3 % — ABNORMAL HIGH (ref 11.5–15.5)
WBC: 9.8 10*3/uL (ref 4.0–10.5)
nRBC: 0 % (ref 0.0–0.2)

## 2020-06-23 LAB — MAGNESIUM: Magnesium: 1.8 mg/dL (ref 1.7–2.4)

## 2020-06-23 NOTE — Progress Notes (Signed)
Pulmonary Critical Care Medicine [facility] [place]   PULMONARY CRITICAL CARE SERVICE  PROGRESS NOTE  Date of Service: 06/23/2020  Math Brazie  ZOX:[id number]  DOB: 1955-07-16   DOA: 06/05/2020  Referring Physician: Carron Curie, MD  HPI: Dillon Livermore is a 65 y.o. male seen for follow up of Acute on Chronic Respiratory Failure.  Patient currently is on T collar on 35% FiO2 with a goal of 24 hours  Medications: Reviewed on Rounds  Physical Exam:  Vitals: Temperature is 97.2 pulse 70 respiratory 21 blood pressure is 108/74 saturations 100%  Ventilator Settings temperature is 97.2 pulse 70 respiratory rate is 21 blood pressure is 108/74 saturations 100%  . General: Comfortable at this time . Eyes: Grossly normal lids, irises & conjunctiva . ENT: grossly tongue is normal . Neck: no obvious mass . Cardiovascular: S1 S2 normal no gallop . Respiratory: Coarse rhonchi expansion is equal . Abdomen: soft . Skin: no rash seen on limited exam . Musculoskeletal: not rigid . Psychiatric:unable to assess . Neurologic: no seizure no involuntary movements         Lab Data:   Basic Metabolic Panel: Recent Labs  Lab 06/18/20 0417 06/20/20 0521 06/23/20 0642  NA 140 134* 139  K 5.0 4.2 4.0  CL 99 95* 99  CO2 31 29 29   GLUCOSE 206* 166* 172*  BUN 32* 27* 20  CREATININE 0.47* 0.41* 0.30*  CALCIUM 9.8 9.2 9.3  MG  --   --  1.8    ABG: No results for input(s): PHART, PCO2ART, PO2ART, HCO3, O2SAT in the last 168 hours.  Liver Function Tests: No results for input(s): AST, ALT, ALKPHOS, BILITOT, PROT, ALBUMIN in the last 168 hours. No results for input(s): LIPASE, AMYLASE in the last 168 hours. No results for input(s): AMMONIA in the last 168 hours.  CBC: Recent Labs  Lab 06/18/20 0417 06/23/20 0642  WBC 9.0 9.8  HGB 12.2* 13.0  HCT 38.4* 41.7  MCV 95.3 93.9  PLT 192 124*    Cardiac Enzymes: No results for input(s): CKTOTAL, CKMB, CKMBINDEX, TROPONINI in the  last 168 hours.  BNP (last 3 results) No results for input(s): BNP in the last 8760 hours.  ProBNP (last 3 results) No results for input(s): PROBNP in the last 8760 hours.  Radiological Exams: No results found.  Assessment/Plan Active Problems:   Acute on chronic respiratory failure with hypoxia (HCC)   Acute inflammatory demyelinating polyneuropathy (HCC)   Dysautonomia (HCC)   Atrial fibrillation with RVR (HCC)   Healthcare associated bacterial pneumonia   1. Acute on chronic respiratory failure hypoxia we will continue with the T collar goal is for 24 hours. 2. Acute inflammatory demyelinating polyneuropathy supportive care we will continue to follow 3. Dysautonomia at baseline 4. Atrial fibrillation rate is controlled 5. Healthcare associated pneumonia treated   I have personally seen and evaluated the patient, evaluated laboratory and imaging results, formulated the assessment and plan and placed orders. The Patient requires high complexity decision making with multiple systems involvement.  Rounds were done with the Respiratory Therapy Director and Staff therapists and discussed with nursing staff also.  06/25/20, MD [facility] Pulmonary Critical Care Medicine Sleep Medicine

## 2020-06-24 DIAGNOSIS — I4891 Unspecified atrial fibrillation: Secondary | ICD-10-CM | POA: Diagnosis not present

## 2020-06-24 DIAGNOSIS — G610 Guillain-Barre syndrome: Secondary | ICD-10-CM | POA: Diagnosis not present

## 2020-06-24 DIAGNOSIS — J9621 Acute and chronic respiratory failure with hypoxia: Secondary | ICD-10-CM | POA: Diagnosis not present

## 2020-06-24 DIAGNOSIS — G901 Familial dysautonomia [Riley-Day]: Secondary | ICD-10-CM | POA: Diagnosis not present

## 2020-06-24 NOTE — Progress Notes (Signed)
Pulmonary Critical Care Medicine [facility] [place]   PULMONARY CRITICAL CARE SERVICE  PROGRESS NOTE  Date of Service: 06/24/2020  Brian Espinoza  [id number]  DOB: 04-03-1956   DOA: 06/05/2020  Referring Physician: Carron Curie, MD  HPI: Brian Espinoza is a 65 y.o. male seen for follow up of Acute on Chronic Respiratory Failure.  Apparently had increased heart rate now is doing little bit better.  Patient is on pressure support currently 12/5  Medications: Reviewed on Rounds  Physical Exam:  Vitals: Temperature 97.6 pulse 117 respiratory 27 blood pressure is 124/64 saturations 96%  Ventilator Settings on pressure support 12/5  . General: Comfortable at this time . Eyes: Grossly normal lids, irises & conjunctiva . ENT: grossly tongue is normal . Neck: no obvious mass . Cardiovascular: S1 S2 normal no gallop . Respiratory: Scattered rhonchi no rales . Abdomen: soft . Skin: no rash seen on limited exam . Musculoskeletal: not rigid . Psychiatric:unable to assess . Neurologic: no seizure no involuntary movements         Lab Data:   Basic Metabolic Panel: Recent Labs  Lab 06/18/20 0417 06/20/20 0521 06/23/20 0642  NA 140 134* 139  K 5.0 4.2 4.0  CL 99 95* 99  CO2 31 29 29   GLUCOSE 206* 166* 172*  BUN 32* 27* 20  CREATININE 0.47* 0.41* 0.30*  CALCIUM 9.8 9.2 9.3  MG  --   --  1.8    ABG: Recent Labs  Lab 06/23/20 1246  PHART 7.430  PCO2ART 46.0  PO2ART 110*  HCO3 30.0*  O2SAT 98.5    Liver Function Tests: No results for input(s): AST, ALT, ALKPHOS, BILITOT, PROT, ALBUMIN in the last 168 hours. No results for input(s): LIPASE, AMYLASE in the last 168 hours. No results for input(s): AMMONIA in the last 168 hours.  CBC: Recent Labs  Lab 06/18/20 0417 06/23/20 0642  WBC 9.0 9.8  HGB 12.2* 13.0  HCT 38.4* 41.7  MCV 95.3 93.9  PLT 192 124*    Cardiac Enzymes: No results for input(s): CKTOTAL, CKMB, CKMBINDEX, TROPONINI in the last 168  hours.  BNP (last 3 results) No results for input(s): BNP in the last 8760 hours.  ProBNP (last 3 results) No results for input(s): PROBNP in the last 8760 hours.  Radiological Exams: No results found.  Assessment/Plan Active Problems:   Acute on chronic respiratory failure with hypoxia (HCC)   Acute inflammatory demyelinating polyneuropathy (HCC)   Dysautonomia (HCC)   Atrial fibrillation with RVR (HCC)   Healthcare associated bacterial pneumonia   1. Acute on chronic respiratory failure hypoxia we will continue with pressure support mode because of increased heart rate hold off on weaning. 2. Acute inflammatory demyelinating polyneuropathy no change we will continue to monitor 3. Dysautonomia supportive care 4. Atrial fibrillation rate controlled 5. Healthcare associated pneumonia treated we will continue to follow   I have personally seen and evaluated the patient, evaluated laboratory and imaging results, formulated the assessment and plan and placed orders. The Patient requires high complexity decision making with multiple systems involvement.  Rounds were done with the Respiratory Therapy Director and Staff therapists and discussed with nursing staff also.  06/25/20, MD [facility] Pulmonary Critical Care Medicine Sleep Medicine

## 2020-06-25 ENCOUNTER — Other Ambulatory Visit (HOSPITAL_COMMUNITY): Payer: Medicare Other

## 2020-06-25 DIAGNOSIS — J9621 Acute and chronic respiratory failure with hypoxia: Secondary | ICD-10-CM | POA: Diagnosis not present

## 2020-06-25 DIAGNOSIS — G901 Familial dysautonomia [Riley-Day]: Secondary | ICD-10-CM | POA: Diagnosis not present

## 2020-06-25 DIAGNOSIS — I4891 Unspecified atrial fibrillation: Secondary | ICD-10-CM | POA: Diagnosis not present

## 2020-06-25 DIAGNOSIS — G610 Guillain-Barre syndrome: Secondary | ICD-10-CM | POA: Diagnosis not present

## 2020-06-25 NOTE — Progress Notes (Signed)
Pulmonary Critical Care Medicine [facility] [place]   PULMONARY CRITICAL CARE SERVICE  PROGRESS NOTE  Date of Service: 06/25/2020  Brian Espinoza  [id number]  DOB: June 15, 1955   DOA: 06/05/2020  Referring Physician: Carron Curie, MD  HPI: Brian Espinoza is a 65 y.o. male seen for follow up of Acute on Chronic Respiratory Failure.  Patient is currently on pressure support mode has been on 35% FiO2 should be able to do T collar today.  I reviewed the previous chest film showed some developing atelectasis of the left lung I did go ahead and order a follow-up chest film  Medications: Reviewed on Rounds  Physical Exam:  Vitals: Temperature is 97.7 pulse 71 respiratory rate is 21 blood pressure is 118/71 saturations 96%  Ventilator Settings on pressure support FiO2 35% pressure 12/5  . General: Comfortable at this time . Eyes: Grossly normal lids, irises & conjunctiva . ENT: grossly tongue is normal . Neck: no obvious mass . Cardiovascular: S1 S2 normal no gallop . Respiratory: Scattered rhonchi very coarse breath sound . Abdomen: soft . Skin: no rash seen on limited exam . Musculoskeletal: not rigid . Psychiatric:unable to assess . Neurologic: no seizure no involuntary movements         Lab Data:   Basic Metabolic Panel: Recent Labs  Lab 06/20/20 0521 06/23/20 0642  NA 134* 139  K 4.2 4.0  CL 95* 99  CO2 29 29  GLUCOSE 166* 172*  BUN 27* 20  CREATININE 0.41* 0.30*  CALCIUM 9.2 9.3  MG  --  1.8    ABG: Recent Labs  Lab 06/23/20 1246  PHART 7.430  PCO2ART 46.0  PO2ART 110*  HCO3 30.0*  O2SAT 98.5    Liver Function Tests: No results for input(s): AST, ALT, ALKPHOS, BILITOT, PROT, ALBUMIN in the last 168 hours. No results for input(s): LIPASE, AMYLASE in the last 168 hours. No results for input(s): AMMONIA in the last 168 hours.  CBC: Recent Labs  Lab 06/23/20 0642  WBC 9.8  HGB 13.0  HCT 41.7  MCV 93.9  PLT 124*    Cardiac Enzymes: No  results for input(s): CKTOTAL, CKMB, CKMBINDEX, TROPONINI in the last 168 hours.  BNP (last 3 results) No results for input(s): BNP in the last 8760 hours.  ProBNP (last 3 results) No results for input(s): PROBNP in the last 8760 hours.  Radiological Exams: No results found.  Assessment/Plan Active Problems:   Acute on chronic respiratory failure with hypoxia (HCC)   Acute inflammatory demyelinating polyneuropathy (HCC)   Dysautonomia (HCC)   Atrial fibrillation with RVR (HCC)   Healthcare associated bacterial pneumonia   1. Acute on chronic respiratory failure hypoxia we will continue with pressure support titrate oxygen as tolerated continue pulmonary toilet. 2. Acute inflammatory demyelinating polyneuropathy no change continue to follow 3. Dysautonomia supportive care 4. Atrial fibrillation rate is controlled 5. Healthcare associated pneumonia treated we will continue to follow along   I have personally seen and evaluated the patient, evaluated laboratory and imaging results, formulated the assessment and plan and placed orders. The Patient requires high complexity decision making with multiple systems involvement.  Rounds were done with the Respiratory Therapy Director and Staff therapists and discussed with nursing staff also.  Yevonne Pax, MD [facility] Pulmonary Critical Care Medicine Sleep Medicine

## 2020-06-26 DIAGNOSIS — G610 Guillain-Barre syndrome: Secondary | ICD-10-CM | POA: Diagnosis not present

## 2020-06-26 DIAGNOSIS — I4891 Unspecified atrial fibrillation: Secondary | ICD-10-CM | POA: Diagnosis not present

## 2020-06-26 DIAGNOSIS — G901 Familial dysautonomia [Riley-Day]: Secondary | ICD-10-CM | POA: Diagnosis not present

## 2020-06-26 DIAGNOSIS — J9621 Acute and chronic respiratory failure with hypoxia: Secondary | ICD-10-CM | POA: Diagnosis not present

## 2020-06-26 LAB — CBC
HCT: 45.2 % (ref 39.0–52.0)
Hemoglobin: 14.5 g/dL (ref 13.0–17.0)
MCH: 29.8 pg (ref 26.0–34.0)
MCHC: 32.1 g/dL (ref 30.0–36.0)
MCV: 93 fL (ref 80.0–100.0)
Platelets: 151 10*3/uL (ref 150–400)
RBC: 4.86 MIL/uL (ref 4.22–5.81)
RDW: 18.4 % — ABNORMAL HIGH (ref 11.5–15.5)
WBC: 14 10*3/uL — ABNORMAL HIGH (ref 4.0–10.5)
nRBC: 0 % (ref 0.0–0.2)

## 2020-06-26 LAB — BLOOD GAS, ARTERIAL
Acid-Base Excess: 5.8 mmol/L — ABNORMAL HIGH (ref 0.0–2.0)
Bicarbonate: 30.2 mmol/L — ABNORMAL HIGH (ref 20.0–28.0)
FIO2: 35
O2 Saturation: 95.7 %
Patient temperature: 38
pCO2 arterial: 49.1 mmHg — ABNORMAL HIGH (ref 32.0–48.0)
pH, Arterial: 7.411 (ref 7.350–7.450)
pO2, Arterial: 82.6 mmHg — ABNORMAL LOW (ref 83.0–108.0)

## 2020-06-26 LAB — BASIC METABOLIC PANEL
Anion gap: 10 (ref 5–15)
BUN: 20 mg/dL (ref 8–23)
CO2: 31 mmol/L (ref 22–32)
Calcium: 8.9 mg/dL (ref 8.9–10.3)
Chloride: 98 mmol/L (ref 98–111)
Creatinine, Ser: 0.3 mg/dL — ABNORMAL LOW (ref 0.61–1.24)
GFR, Estimated: >60 mL/min (ref >60)
Glucose, Bld: 119 mg/dL — ABNORMAL HIGH (ref 70–99)
Potassium: 3.6 mmol/L (ref 3.5–5.1)
Sodium: 139 mmol/L (ref 135–145)

## 2020-06-26 NOTE — Progress Notes (Signed)
Pulmonary Critical Care Medicine [facility] [place]   PULMONARY CRITICAL CARE SERVICE  PROGRESS NOTE  Date of Service: 06/26/2020  Brian Espinoza  [id number]  DOB: 1955/07/08   DOA: 06/05/2020  Referring Physician: Carron Curie, MD  HPI: Brian Espinoza is a 65 y.o. male seen for follow up of Acute on Chronic Respiratory Failure.  Patient currently is on T collar has been on 35% FiO2 goal today is for 24 hours  Medications: Reviewed on Rounds  Physical Exam:  Vitals: Temperature is 98.4 pulse 87 respiratory 31 blood pressure is 135/84 saturations once 94%  Ventilator Settings on T collar FiO2 of 35%  . General: Comfortable at this time . Eyes: Grossly normal lids, irises & conjunctiva . ENT: grossly tongue is normal . Neck: no obvious mass . Cardiovascular: S1 S2 normal no gallop . Respiratory: Coarse breath sounds with few scattered rhonchi . Abdomen: soft . Skin: no rash seen on limited exam . Musculoskeletal: not rigid . Psychiatric:unable to assess . Neurologic: no seizure no involuntary movements         Lab Data:   Basic Metabolic Panel: Recent Labs  Lab 06/20/20 0521 06/23/20 0642  NA 134* 139  K 4.2 4.0  CL 95* 99  CO2 29 29  GLUCOSE 166* 172*  BUN 27* 20  CREATININE 0.41* 0.30*  CALCIUM 9.2 9.3  MG  --  1.8    ABG: Recent Labs  Lab 06/23/20 1246  PHART 7.430  PCO2ART 46.0  PO2ART 110*  HCO3 30.0*  O2SAT 98.5    Liver Function Tests: No results for input(s): AST, ALT, ALKPHOS, BILITOT, PROT, ALBUMIN in the last 168 hours. No results for input(s): LIPASE, AMYLASE in the last 168 hours. No results for input(s): AMMONIA in the last 168 hours.  CBC: Recent Labs  Lab 06/23/20 0642  WBC 9.8  HGB 13.0  HCT 41.7  MCV 93.9  PLT 124*    Cardiac Enzymes: No results for input(s): CKTOTAL, CKMB, CKMBINDEX, TROPONINI in the last 168 hours.  BNP (last 3 results) No results for input(s): BNP in the last 8760 hours.  ProBNP (last 3  results) No results for input(s): PROBNP in the last 8760 hours.  Radiological Exams: DG CHEST PORT 1 VIEW  Result Date: 06/25/2020 CLINICAL DATA:  Congestion. EXAM: PORTABLE CHEST 1 VIEW COMPARISON:  Chest radiograph dated 06/14/2020 FINDINGS: A tracheostomy tube terminates in the upper thoracic trachea. A left upper extremity peripherally inserted central venous catheter tip overlies the superior cavoatrial junction. The heart is borderline enlarged. Right infrahilar opacity at the lung base may represent atelectasis/airspace disease versus pericardial fat. The left lung is clear. There is no pleural effusion or pneumothorax. No acute osseous injury. IMPRESSION: Right infrahilar opacity may represent atelectasis/airspace disease versus pericardial fat. Electronically Signed   By: Romona Curls M.D.   On: 06/25/2020 13:50    Assessment/Plan Active Problems:   Acute on chronic respiratory failure with hypoxia (HCC)   Acute inflammatory demyelinating polyneuropathy (HCC)   Dysautonomia (HCC)   Atrial fibrillation with RVR (HCC)   Healthcare associated bacterial pneumonia   1. Acute on chronic respiratory failure hypoxia we will continue with the T collar titrate oxygen continue pulmonary toilet. 2. Acute inflammatory demyelinating polyneuropathy needs ongoing aggressive pulmonary toilet continue to follow along. 3. Dysautonomia no change supportive care. 4. Atrial fibrillation RVR rate control 5. Healthcare associated pneumonia has been treated with antibiotic   I have personally seen and evaluated the patient, evaluated laboratory and imaging  results, formulated the assessment and plan and placed orders. The Patient requires high complexity decision making with multiple systems involvement.  Rounds were done with the Respiratory Therapy Director and Staff therapists and discussed with nursing staff also.  Allyne Gee, MD [facility] Pulmonary Critical Care Medicine Sleep Medicine

## 2020-06-27 ENCOUNTER — Other Ambulatory Visit (HOSPITAL_COMMUNITY): Payer: Medicare Other

## 2020-06-27 DIAGNOSIS — I4891 Unspecified atrial fibrillation: Secondary | ICD-10-CM | POA: Diagnosis not present

## 2020-06-27 DIAGNOSIS — G610 Guillain-Barre syndrome: Secondary | ICD-10-CM | POA: Diagnosis not present

## 2020-06-27 DIAGNOSIS — J9621 Acute and chronic respiratory failure with hypoxia: Secondary | ICD-10-CM | POA: Diagnosis not present

## 2020-06-27 DIAGNOSIS — G901 Familial dysautonomia [Riley-Day]: Secondary | ICD-10-CM | POA: Diagnosis not present

## 2020-06-27 LAB — RENAL FUNCTION PANEL
Albumin: 3 g/dL — ABNORMAL LOW (ref 3.5–5.0)
Anion gap: 11 (ref 5–15)
BUN: 19 mg/dL (ref 8–23)
CO2: 28 mmol/L (ref 22–32)
Calcium: 9.3 mg/dL (ref 8.9–10.3)
Chloride: 100 mmol/L (ref 98–111)
Creatinine, Ser: <0.3 mg/dL — ABNORMAL LOW (ref 0.61–1.24)
Glucose, Bld: 195 mg/dL — ABNORMAL HIGH (ref 70–99)
Phosphorus: 3.5 mg/dL (ref 2.5–4.6)
Potassium: 4.4 mmol/L (ref 3.5–5.1)
Sodium: 139 mmol/L (ref 135–145)

## 2020-06-27 LAB — CBC
HCT: 47 % (ref 39.0–52.0)
Hemoglobin: 14.8 g/dL (ref 13.0–17.0)
MCH: 29.7 pg (ref 26.0–34.0)
MCHC: 31.5 g/dL (ref 30.0–36.0)
MCV: 94.2 fL (ref 80.0–100.0)
Platelets: 107 10*3/uL — ABNORMAL LOW (ref 150–400)
RBC: 4.99 MIL/uL (ref 4.22–5.81)
RDW: 17.8 % — ABNORMAL HIGH (ref 11.5–15.5)
WBC: 10.5 10*3/uL (ref 4.0–10.5)
nRBC: 0 % (ref 0.0–0.2)

## 2020-06-27 LAB — MAGNESIUM: Magnesium: 1.9 mg/dL (ref 1.7–2.4)

## 2020-06-27 NOTE — Progress Notes (Signed)
Pulmonary Critical Care Medicine [facility] [place]   PULMONARY CRITICAL CARE SERVICE  PROGRESS NOTE  Date of Service: 06/27/2020  Brian Espinoza  CZY:[id number]  DOB: 1955/10/04   DOA: 06/05/2020  Referring Physician: Carron Curie, MD  HPI: Brian Espinoza is a 65 y.o. male seen for follow up of Acute on Chronic Respiratory Failure.  Currently patient is on T collar has been on 35% FiO2 goal is for 48-hour  Medications: Reviewed on Rounds  Physical Exam:  Vitals: Temperature is 95.9 pulse 72 respiratory 19 blood pressure is 145/80 saturations 95%  Ventilator Settings on T collar with an FiO2 35%  . General: Comfortable at this time . Eyes: Grossly normal lids, irises & conjunctiva . ENT: grossly tongue is normal . Neck: no obvious mass . Cardiovascular: S1 S2 normal no gallop . Respiratory: Scattered rhonchi expansion is equal . Abdomen: soft . Skin: no rash seen on limited exam . Musculoskeletal: not rigid . Psychiatric:unable to assess . Neurologic: no seizure no involuntary movements         Lab Data:   Basic Metabolic Panel: Recent Labs  Lab 06/23/20 0642 06/26/20 1055 06/27/20 0431  NA 139 139 139  K 4.0 3.6 4.4  CL 99 98 100  CO2 29 31 28   GLUCOSE 172* 119* 195*  BUN 20 20 19   CREATININE 0.30* 0.30* <0.30*  CALCIUM 9.3 8.9 9.3  MG 1.8  --  1.9  PHOS  --   --  3.5    ABG: Recent Labs  Lab 06/23/20 1246 06/26/20 1655  PHART 7.430 7.411  PCO2ART 46.0 49.1*  PO2ART 110* 82.6*  HCO3 30.0* 30.2*  O2SAT 98.5 95.7    Liver Function Tests: Recent Labs  Lab 06/27/20 0431  ALBUMIN 3.0*   No results for input(s): LIPASE, AMYLASE in the last 168 hours. No results for input(s): AMMONIA in the last 168 hours.  CBC: Recent Labs  Lab 06/23/20 0642 06/26/20 1055 06/27/20 0431  WBC 9.8 14.0* 10.5  HGB 13.0 14.5 14.8  HCT 41.7 45.2 47.0  MCV 93.9 93.0 94.2  PLT 124* 151 107*    Cardiac Enzymes: No results for input(s): CKTOTAL, CKMB,  CKMBINDEX, TROPONINI in the last 168 hours.  BNP (last 3 results) No results for input(s): BNP in the last 8760 hours.  ProBNP (last 3 results) No results for input(s): PROBNP in the last 8760 hours.  Radiological Exams: DG CHEST PORT 1 VIEW  Result Date: 06/25/2020 CLINICAL DATA:  Congestion. EXAM: PORTABLE CHEST 1 VIEW COMPARISON:  Chest radiograph dated 06/14/2020 FINDINGS: A tracheostomy tube terminates in the upper thoracic trachea. A left upper extremity peripherally inserted central venous catheter tip overlies the superior cavoatrial junction. The heart is borderline enlarged. Right infrahilar opacity at the lung base may represent atelectasis/airspace disease versus pericardial fat. The left lung is clear. There is no pleural effusion or pneumothorax. No acute osseous injury. IMPRESSION: Right infrahilar opacity may represent atelectasis/airspace disease versus pericardial fat. Electronically Signed   By: 06/27/2020 M.D.   On: 06/25/2020 13:50    Assessment/Plan Active Problems:   Acute on chronic respiratory failure with hypoxia (HCC)   Acute inflammatory demyelinating polyneuropathy (HCC)   Dysautonomia (HCC)   Atrial fibrillation with RVR (HCC)   Healthcare associated bacterial pneumonia   1. Acute on chronic respiratory failure hypoxia we will continue with T-piece titrate oxygen continue pulmonary toilet 2. Acute inflammatory demyelinating polyneuropathy no change we will continue to monitor 3. Dysautonomia at baseline 4. Atrial  fibrillation rate is controlled 5. Healthcare associated pneumonia with continue to follow along has been treated with antibiotic   I have personally seen and evaluated the patient, evaluated laboratory and imaging results, formulated the assessment and plan and placed orders. The Patient requires high complexity decision making with multiple systems involvement.  Rounds were done with the Respiratory Therapy Director and Staff therapists and  discussed with nursing staff also.  Yevonne Pax, MD [facility] Pulmonary Critical Care Medicine Sleep Medicine

## 2020-06-28 DIAGNOSIS — I4891 Unspecified atrial fibrillation: Secondary | ICD-10-CM | POA: Diagnosis not present

## 2020-06-28 DIAGNOSIS — J9621 Acute and chronic respiratory failure with hypoxia: Secondary | ICD-10-CM | POA: Diagnosis not present

## 2020-06-28 DIAGNOSIS — G610 Guillain-Barre syndrome: Secondary | ICD-10-CM | POA: Diagnosis not present

## 2020-06-28 DIAGNOSIS — G901 Familial dysautonomia [Riley-Day]: Secondary | ICD-10-CM | POA: Diagnosis not present

## 2020-06-28 LAB — CULTURE, RESPIRATORY W GRAM STAIN

## 2020-06-28 NOTE — Progress Notes (Signed)
Pulmonary Critical Care Medicine [facility] [place]   PULMONARY CRITICAL CARE SERVICE  PROGRESS NOTE  Date of Service: 06/28/2020  Brian Espinoza  TGG:[id number]  DOB: 1955-08-18   DOA: 06/05/2020  Referring Physician: Carron Curie, MD  HPI: Brian Espinoza is a 65 y.o. male seen for follow up of Acute on Chronic Respiratory Failure.  Patient at this time is on pressure support has been on pressure of 12/5  Medications: Reviewed on Rounds  Physical Exam:  Vitals: Temperature is 97.5 pulse 90 respiratory rate is 30 blood pressure is 156/90 saturations 98%  Ventilator Settings currently on pressure support FiO2 40% pressure of 12/5  . General: Comfortable at this time . Eyes: Grossly normal lids, irises & conjunctiva . ENT: grossly tongue is normal . Neck: no obvious mass . Cardiovascular: S1 S2 normal no gallop . Respiratory: No rhonchi very coarse breath . Abdomen: soft . Skin: no rash seen on limited exam . Musculoskeletal: not rigid . Psychiatric:unable to assess . Neurologic: no seizure no involuntary movements         Lab Data:   Basic Metabolic Panel: Recent Labs  Lab 06/23/20 0642 06/26/20 1055 06/27/20 0431  NA 139 139 139  K 4.0 3.6 4.4  CL 99 98 100  CO2 29 31 28   GLUCOSE 172* 119* 195*  BUN 20 20 19   CREATININE 0.30* 0.30* <0.30*  CALCIUM 9.3 8.9 9.3  MG 1.8  --  1.9  PHOS  --   --  3.5    ABG: Recent Labs  Lab 06/23/20 1246 06/26/20 1655  PHART 7.430 7.411  PCO2ART 46.0 49.1*  PO2ART 110* 82.6*  HCO3 30.0* 30.2*  O2SAT 98.5 95.7    Liver Function Tests: Recent Labs  Lab 06/27/20 0431  ALBUMIN 3.0*   No results for input(s): LIPASE, AMYLASE in the last 168 hours. No results for input(s): AMMONIA in the last 168 hours.  CBC: Recent Labs  Lab 06/23/20 0642 06/26/20 1055 06/27/20 0431  WBC 9.8 14.0* 10.5  HGB 13.0 14.5 14.8  HCT 41.7 45.2 47.0  MCV 93.9 93.0 94.2  PLT 124* 151 107*    Cardiac Enzymes: No results for  input(s): CKTOTAL, CKMB, CKMBINDEX, TROPONINI in the last 168 hours.  BNP (last 3 results) No results for input(s): BNP in the last 8760 hours.  ProBNP (last 3 results) No results for input(s): PROBNP in the last 8760 hours.  Radiological Exams: No results found.  Assessment/Plan Active Problems:   Acute on chronic respiratory failure with hypoxia (HCC)   Acute inflammatory demyelinating polyneuropathy (HCC)   Dysautonomia (HCC)   Atrial fibrillation with RVR (HCC)   Healthcare associated bacterial pneumonia   1. Acute on chronic respiratory failure hypoxia we will continue with weaning on 12/5 pressure support because overnight patient had copious amounts of secretions requiring him to be placed back on the ventilator 2. Acute inflammatory demyelinating polyneuropathy supportive care we will continue his monitor 3. Dysautonomia no change 4. Atrial fibrillation rate is controlled 5. Healthcare associated pneumonia treated we will continue to monitor   I have personally seen and evaluated the patient, evaluated laboratory and imaging results, formulated the assessment and plan and placed orders. The Patient requires high complexity decision making with multiple systems involvement.  Rounds were done with the Respiratory Therapy Director and Staff therapists and discussed with nursing staff also.  08/25/20, MD [facility] Pulmonary Critical Care Medicine Sleep Medicine

## 2020-06-29 ENCOUNTER — Other Ambulatory Visit (HOSPITAL_COMMUNITY): Payer: Medicare Other

## 2020-06-29 DIAGNOSIS — G610 Guillain-Barre syndrome: Secondary | ICD-10-CM | POA: Diagnosis not present

## 2020-06-29 DIAGNOSIS — J9621 Acute and chronic respiratory failure with hypoxia: Secondary | ICD-10-CM | POA: Diagnosis not present

## 2020-06-29 DIAGNOSIS — G901 Familial dysautonomia [Riley-Day]: Secondary | ICD-10-CM | POA: Diagnosis not present

## 2020-06-29 DIAGNOSIS — I4891 Unspecified atrial fibrillation: Secondary | ICD-10-CM | POA: Diagnosis not present

## 2020-06-29 NOTE — Progress Notes (Signed)
Pulmonary Critical Care Medicine [facility] [place]   PULMONARY CRITICAL CARE SERVICE  PROGRESS NOTE  Date of Service: 06/29/2020  Brian Espinoza  NAT:[id number]  DOB: Dec 06, 1955   DOA: 06/05/2020  Referring Physician: Carron Curie, MD  HPI: Brian Espinoza is a 65 y.o. male seen for follow up of Acute on Chronic Respiratory Failure.  Patient currently is on pressure control mode on 40% FiO2 good saturations are noted at this time  Medications: Reviewed on Rounds  Physical Exam:  Vitals: Temperature is 98.3 pulse 103 respiratory rate is 26 blood pressure is 154/97 saturations 96%  Ventilator Settings on pressure control FiO2 40% PEEP 5 IP 14  . General: Comfortable at this time . Eyes: Grossly normal lids, irises & conjunctiva . ENT: grossly tongue is normal . Neck: no obvious mass . Cardiovascular: S1 S2 normal no gallop . Respiratory: Scattered rhonchi expansion is equal . Abdomen: soft . Skin: no rash seen on limited exam . Musculoskeletal: not rigid . Psychiatric:unable to assess . Neurologic: no seizure no involuntary movements         Lab Data:   Basic Metabolic Panel: Recent Labs  Lab 06/23/20 0642 06/26/20 1055 06/27/20 0431  NA 139 139 139  K 4.0 3.6 4.4  CL 99 98 100  CO2 29 31 28   GLUCOSE 172* 119* 195*  BUN 20 20 19   CREATININE 0.30* 0.30* <0.30*  CALCIUM 9.3 8.9 9.3  MG 1.8  --  1.9  PHOS  --   --  3.5    ABG: Recent Labs  Lab 06/23/20 1246 06/26/20 1655  PHART 7.430 7.411  PCO2ART 46.0 49.1*  PO2ART 110* 82.6*  HCO3 30.0* 30.2*  O2SAT 98.5 95.7    Liver Function Tests: Recent Labs  Lab 06/27/20 0431  ALBUMIN 3.0*   No results for input(s): LIPASE, AMYLASE in the last 168 hours. No results for input(s): AMMONIA in the last 168 hours.  CBC: Recent Labs  Lab 06/23/20 0642 06/26/20 1055 06/27/20 0431  WBC 9.8 14.0* 10.5  HGB 13.0 14.5 14.8  HCT 41.7 45.2 47.0  MCV 93.9 93.0 94.2  PLT 124* 151 107*    Cardiac  Enzymes: No results for input(s): CKTOTAL, CKMB, CKMBINDEX, TROPONINI in the last 168 hours.  BNP (last 3 results) No results for input(s): BNP in the last 8760 hours.  ProBNP (last 3 results) No results for input(s): PROBNP in the last 8760 hours.  Radiological Exams: No results found.  Assessment/Plan Active Problems:   Acute on chronic respiratory failure with hypoxia (HCC)   Acute inflammatory demyelinating polyneuropathy (HCC)   Dysautonomia (HCC)   Atrial fibrillation with RVR (HCC)   Healthcare associated bacterial pneumonia   1. Acute on chronic respiratory failure hypoxia patient continues to do fine on weaning so we will go ahead and try on T collar again today. 2. Acute inflammatory demyelinating polyneuropathy we will continue with supportive care. 3. Dysautonomia no change 4. Atrial fibrillation rate control 5. Healthcare associated pneumonia has been treated   I have personally seen and evaluated the patient, evaluated laboratory and imaging results, formulated the assessment and plan and placed orders. The Patient requires high complexity decision making with multiple systems involvement.  Rounds were done with the Respiratory Therapy Director and Staff therapists and discussed with nursing staff also.  06/28/20, MD [facility] Pulmonary Critical Care Medicine Sleep Medicine

## 2020-06-30 DIAGNOSIS — G901 Familial dysautonomia [Riley-Day]: Secondary | ICD-10-CM | POA: Diagnosis not present

## 2020-06-30 DIAGNOSIS — I4891 Unspecified atrial fibrillation: Secondary | ICD-10-CM | POA: Diagnosis not present

## 2020-06-30 DIAGNOSIS — G610 Guillain-Barre syndrome: Secondary | ICD-10-CM | POA: Diagnosis not present

## 2020-06-30 DIAGNOSIS — J9621 Acute and chronic respiratory failure with hypoxia: Secondary | ICD-10-CM | POA: Diagnosis not present

## 2020-06-30 LAB — BASIC METABOLIC PANEL
Anion gap: 11 (ref 5–15)
BUN: 23 mg/dL (ref 8–23)
CO2: 29 mmol/L (ref 22–32)
Calcium: 8.9 mg/dL (ref 8.9–10.3)
Chloride: 98 mmol/L (ref 98–111)
Creatinine, Ser: <0.3 mg/dL — ABNORMAL LOW (ref 0.61–1.24)
Glucose, Bld: 195 mg/dL — ABNORMAL HIGH (ref 70–99)
Potassium: 5.3 mmol/L — ABNORMAL HIGH (ref 3.5–5.1)
Sodium: 138 mmol/L (ref 135–145)

## 2020-06-30 LAB — BLOOD GAS, ARTERIAL
Acid-Base Excess: 8.7 mmol/L — ABNORMAL HIGH (ref 0.0–2.0)
Bicarbonate: 34.6 mmol/L — ABNORMAL HIGH (ref 20.0–28.0)
FIO2: 45
O2 Saturation: 96.8 %
Patient temperature: 37
pCO2 arterial: 67.7 mmHg (ref 32.0–48.0)
pH, Arterial: 7.329 — ABNORMAL LOW (ref 7.350–7.450)
pO2, Arterial: 89.7 mmHg (ref 83.0–108.0)

## 2020-06-30 LAB — URINALYSIS, ROUTINE W REFLEX MICROSCOPIC
Bacteria, UA: NONE SEEN
Bilirubin Urine: NEGATIVE
Glucose, UA: 50 mg/dL — AB
Hgb urine dipstick: NEGATIVE
Ketones, ur: NEGATIVE mg/dL
Leukocytes,Ua: NEGATIVE
Nitrite: NEGATIVE
Protein, ur: 30 mg/dL — AB
Specific Gravity, Urine: 1.03 (ref 1.005–1.030)
pH: 5 (ref 5.0–8.0)

## 2020-06-30 LAB — CBC
HCT: 47.1 % (ref 39.0–52.0)
Hemoglobin: 15.2 g/dL (ref 13.0–17.0)
MCH: 30.3 pg (ref 26.0–34.0)
MCHC: 32.3 g/dL (ref 30.0–36.0)
MCV: 94 fL (ref 80.0–100.0)
Platelets: 148 10*3/uL — ABNORMAL LOW (ref 150–400)
RBC: 5.01 MIL/uL (ref 4.22–5.81)
RDW: 17.9 % — ABNORMAL HIGH (ref 11.5–15.5)
WBC: 10 10*3/uL (ref 4.0–10.5)
nRBC: 0 % (ref 0.0–0.2)

## 2020-06-30 NOTE — Progress Notes (Signed)
Pulmonary Critical Care Medicine [facility] [place]   PULMONARY CRITICAL CARE SERVICE  PROGRESS NOTE  Date of Service: 06/30/2020  Brian Espinoza  URK:[id number]  DOB: Jul 21, 1955   DOA: 06/05/2020  Referring Physician: Carron Curie, MD  HPI: Brian Espinoza is a 65 y.o. male seen for follow up of Acute on Chronic Respiratory Failure.  Patient currently is on pressure assist control has been on 50% FiO2 will be trying on pressure support again today  Medications: Reviewed on Rounds  Physical Exam:  Vitals: Temperature is 97.1 pulse 120 respiratory 21 blood pressure is 165/91 saturations 98%  Ventilator Settings on pressure assist control FiO2 is 50% IP 14 PEEP 5  . General: Comfortable at this time . Eyes: Grossly normal lids, irises & conjunctiva . ENT: grossly tongue is normal . Neck: no obvious mass . Cardiovascular: S1 S2 normal no gallop . Respiratory: Scattered rhonchi very coarse breath sound . Abdomen: soft . Skin: no rash seen on limited exam . Musculoskeletal: not rigid . Psychiatric:unable to assess . Neurologic: no seizure no involuntary movements         Lab Data:   Basic Metabolic Panel: Recent Labs  Lab 06/26/20 1055 06/27/20 0431 06/30/20 0536  NA 139 139 138  K 3.6 4.4 5.3*  CL 98 100 98  CO2 31 28 29   GLUCOSE 119* 195* 195*  BUN 20 19 23   CREATININE 0.30* <0.30* <0.30*  CALCIUM 8.9 9.3 8.9  MG  --  1.9  --   PHOS  --  3.5  --     ABG: Recent Labs  Lab 06/23/20 1246 06/26/20 1655  PHART 7.430 7.411  PCO2ART 46.0 49.1*  PO2ART 110* 82.6*  HCO3 30.0* 30.2*  O2SAT 98.5 95.7    Liver Function Tests: Recent Labs  Lab 06/27/20 0431  ALBUMIN 3.0*   No results for input(s): LIPASE, AMYLASE in the last 168 hours. No results for input(s): AMMONIA in the last 168 hours.  CBC: Recent Labs  Lab 06/26/20 1055 06/27/20 0431 06/30/20 0536  WBC 14.0* 10.5 10.0  HGB 14.5 14.8 15.2  HCT 45.2 47.0 47.1  MCV 93.0 94.2 94.0  PLT 151  107* 148*    Cardiac Enzymes: No results for input(s): CKTOTAL, CKMB, CKMBINDEX, TROPONINI in the last 168 hours.  BNP (last 3 results) No results for input(s): BNP in the last 8760 hours.  ProBNP (last 3 results) No results for input(s): PROBNP in the last 8760 hours.  Radiological Exams: DG CHEST PORT 1 VIEW  Result Date: 06/29/2020 CLINICAL DATA:  Fever EXAM: PORTABLE CHEST 1 VIEW COMPARISON:  06/25/2020 FINDINGS: Tracheostomy tube and left PICC line remain in place. Stable cardiomegaly. Increased streaky bibasilar opacities, left worse than right. No pleural effusion or pneumothorax. IMPRESSION: Increased streaky bibasilar opacities, left worse than right, which may reflect atelectasis versus pneumonia. Electronically Signed   By: 08/27/2020 D.O.   On: 06/29/2020 14:44    Assessment/Plan Active Problems:   Acute on chronic respiratory failure with hypoxia (HCC)   Acute inflammatory demyelinating polyneuropathy (HCC)   Dysautonomia (HCC)   Atrial fibrillation with RVR (HCC)   Healthcare associated bacterial pneumonia   1. Acute on chronic respiratory failure hypoxia we will continue with pressure assist control IP 14 PEEP 5 for resting mode and then wean on pressure support 2. Acute inflammatory demyelinating polyneuropathy no change we will continue to follow 3. Dysautonomia patient is at baseline we will continue with supportive care 4. Atrial fibrillation rate is controlled  5. Healthcare associated pneumonia treated we will continue to monitor   I have personally seen and evaluated the patient, evaluated laboratory and imaging results, formulated the assessment and plan and placed orders. The Patient requires high complexity decision making with multiple systems involvement.  Rounds were done with the Respiratory Therapy Director and Staff therapists and discussed with nursing staff also.  Yevonne Pax, MD [facility] Pulmonary Critical Care Medicine Sleep Medicine

## 2020-07-01 DIAGNOSIS — I4891 Unspecified atrial fibrillation: Secondary | ICD-10-CM | POA: Diagnosis not present

## 2020-07-01 DIAGNOSIS — G901 Familial dysautonomia [Riley-Day]: Secondary | ICD-10-CM | POA: Diagnosis not present

## 2020-07-01 DIAGNOSIS — J9621 Acute and chronic respiratory failure with hypoxia: Secondary | ICD-10-CM | POA: Diagnosis not present

## 2020-07-01 DIAGNOSIS — G610 Guillain-Barre syndrome: Secondary | ICD-10-CM | POA: Diagnosis not present

## 2020-07-01 LAB — URINE CULTURE: Culture: NO GROWTH

## 2020-07-01 LAB — POTASSIUM: Potassium: 3.7 mmol/L (ref 3.5–5.1)

## 2020-07-01 NOTE — Consult Note (Signed)
Ref: Elana Alm, MD   Subjective:  Episodes of atrial fibrillation with RVR.  Objective:  Vital Signs in the last 24 hours: P: 116-130, R: 19, BP : 112/89, O2 sat 97 % on 45 % FiO2, 12 AC  Arterial Line BP: ()/()   Physical Exam: BP Readings from Last 1 Encounters:  05/06/19 124/79     Wt Readings from Last 1 Encounters:  04/04/14 101.2 kg    Weight change:  There is no height or weight on file to calculate BMI. HEENT: [place]/AT, Eyes-Blue, Conjunctiva-Pale, Sclera-Non-icteric Neck: No JVD, No bruit, Tracheostomy with T collar.. Lungs:  Clear, Bilateral. Cardiac:  Irregular rhythm, normal S1 and S2, no S3. II/VI systolic murmur. Abdomen:  Soft, non-tender. BS present. Extremities:  Trace edema present. No cyanosis. No clubbing. CNS: AxOx0, Cranial nerves grossly intact, moves all 4 extremities.  Skin: Warm and dry.   Intake/Output from previous day: No intake/output data recorded.    Lab Results: BMET    Component Value Date/Time   NA 138 06/30/2020 0536   NA 139 06/27/2020 0431   NA 139 06/26/2020 1055   K 3.7 07/01/2020 1126   K 5.3 (H) 06/30/2020 0536   K 4.4 06/27/2020 0431   CL 98 06/30/2020 0536   CL 100 06/27/2020 0431   CL 98 06/26/2020 1055   CO2 29 06/30/2020 0536   CO2 28 06/27/2020 0431   CO2 31 06/26/2020 1055   GLUCOSE 195 (H) 06/30/2020 0536   GLUCOSE 195 (H) 06/27/2020 0431   GLUCOSE 119 (H) 06/26/2020 1055   BUN 23 06/30/2020 0536   BUN 19 06/27/2020 0431   BUN 20 06/26/2020 1055   CREATININE <0.30 (L) 06/30/2020 0536   CREATININE <0.30 (L) 06/27/2020 0431   CREATININE 0.30 (L) 06/26/2020 1055   CALCIUM 8.9 06/30/2020 0536   CALCIUM 9.3 06/27/2020 0431   CALCIUM 8.9 06/26/2020 1055   GFRNONAA NOT CALCULATED 06/30/2020 0536   GFRNONAA NOT CALCULATED 06/27/2020 0431   GFRNONAA >60 06/26/2020 1055   CBC    Component Value Date/Time   WBC 10.0 06/30/2020 0536   RBC 5.01 06/30/2020 0536   HGB 15.2 06/30/2020 0536   HCT 47.1  06/30/2020 0536   PLT 148 (L) 06/30/2020 0536   MCV 94.0 06/30/2020 0536   MCH 30.3 06/30/2020 0536   MCHC 32.3 06/30/2020 0536   RDW 17.9 (H) 06/30/2020 0536   LYMPHSABS 2.3 06/06/2020 0418   MONOABS 0.9 06/06/2020 0418   EOSABS 0.0 06/06/2020 0418   BASOSABS 0.1 06/06/2020 0418   HEPATIC Function Panel Recent Labs    06/06/20 0418  PROT 6.8   HEMOGLOBIN A1C No components found for: HGA1C,  MPG CARDIAC ENZYMES No results found for: CKTOTAL, CKMB, CKMBINDEX, TROPONINI BNP No results for input(s): PROBNP in the last 8760 hours. TSH Recent Labs    06/06/20 0418  TSH 3.257   CHOLESTEROL No results for input(s): CHOL in the last 8760 hours.  Scheduled Meds: Continuous Infusions: PRN Meds:.  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 Increase amiodarone till heart rate under control.   LOS: 0 days   Time spent including chart review, lab review, examination, discussion with patient/Nurse : 30 min   Orpah Cobb  MD  07/01/2020, 6:44 PM

## 2020-07-01 NOTE — Progress Notes (Signed)
Pulmonary Critical Care Medicine [facility] [place]   PULMONARY CRITICAL CARE SERVICE  PROGRESS NOTE  Date of Service: 07/01/2020  Brian Espinoza  BTD:[id number]  DOB: [birth year]   DOA: 06/05/2020  Referring Physician: Carron Curie, MD  HPI: Brian Espinoza is a 65 y.o. male seen for follow up of Acute on Chronic Respiratory Failure.  Patient currently is on pressure control mode has been on 40% FiO2 with an IP of 14  Medications: Reviewed on Rounds  Physical Exam:  Vitals: Temperature 100.2 pulse 128 respiratory rate is 20 blood pressure is 114/70 saturations 97%  Ventilator Settings on pressure assist control FiO2 is 40% IP 14 PEEP 5  . General: Comfortable at this time . Eyes: Grossly normal lids, irises & conjunctiva . ENT: grossly tongue is normal . Neck: no obvious mass . Cardiovascular: S1 S2 normal no gallop . Respiratory: Scattered rhonchi expansion is equal . Abdomen: soft . Skin: no rash seen on limited exam . Musculoskeletal: not rigid . Psychiatric:unable to assess . Neurologic: no seizure no involuntary movements         Lab Data:   Basic Metabolic Panel: Recent Labs  Lab 06/26/20 1055 06/27/20 0431 06/30/20 0536  NA 139 139 138  K 3.6 4.4 5.3*  CL 98 100 98  CO2 31 28 29   GLUCOSE 119* 195* 195*  BUN 20 19 23   CREATININE 0.30* <0.30* <0.30*  CALCIUM 8.9 9.3 8.9  MG  --  1.9  --   PHOS  --  3.5  --     ABG: Recent Labs  Lab 06/26/20 1655 06/30/20 1113  PHART 7.411 7.329*  PCO2ART 49.1* 67.7*  PO2ART 82.6* 89.7  HCO3 30.2* 34.6*  O2SAT 95.7 96.8    Liver Function Tests: Recent Labs  Lab 06/27/20 0431  ALBUMIN 3.0*   No results for input(s): LIPASE, AMYLASE in the last 168 hours. No results for input(s): AMMONIA in the last 168 hours.  CBC: Recent Labs  Lab 06/26/20 1055 06/27/20 0431 06/30/20 0536  WBC 14.0* 10.5 10.0  HGB 14.5 14.8 15.2  HCT 45.2 47.0 47.1  MCV 93.0 94.2 94.0  PLT 151 107* 148*    Cardiac  Enzymes: No results for input(s): CKTOTAL, CKMB, CKMBINDEX, TROPONINI in the last 168 hours.  BNP (last 3 results) No results for input(s): BNP in the last 8760 hours.  ProBNP (last 3 results) No results for input(s): PROBNP in the last 8760 hours.  Radiological Exams: DG CHEST PORT 1 VIEW  Result Date: 06/29/2020 CLINICAL DATA:  Fever EXAM: PORTABLE CHEST 1 VIEW COMPARISON:  06/25/2020 FINDINGS: Tracheostomy tube and left PICC line remain in place. Stable cardiomegaly. Increased streaky bibasilar opacities, left worse than right. No pleural effusion or pneumothorax. IMPRESSION: Increased streaky bibasilar opacities, left worse than right, which may reflect atelectasis versus pneumonia. Electronically Signed   By: 08/27/2020 D.O.   On: 06/29/2020 14:44    Assessment/Plan Active Problems:   Acute on chronic respiratory failure with hypoxia (HCC)   Acute inflammatory demyelinating polyneuropathy (HCC)   Dysautonomia (HCC)   Atrial fibrillation with RVR (HCC)   Healthcare associated bacterial pneumonia   1. Acute on chronic respiratory failure hypoxia we will continue with pressure assist control titrate oxygen continue pulmonary toilet 2. Acute inflammatory demyelinating polyneuropathy no change 3. Dysautonomia supportive care 4. Atrial fibrillation rate controlled 5. Healthcare associated pneumonia has been treated we will monitor   I have personally seen and evaluated the patient, evaluated laboratory and imaging results,  formulated the assessment and plan and placed orders. The Patient requires high complexity decision making with multiple systems involvement.  Rounds were done with the Respiratory Therapy Director and Staff therapists and discussed with nursing staff also.  Allyne Gee, MD [facility] Pulmonary Critical Care Medicine Sleep Medicine

## 2020-07-02 LAB — CBC
HCT: 46.8 % (ref 39.0–52.0)
Hemoglobin: 14.9 g/dL (ref 13.0–17.0)
MCH: 29.6 pg (ref 26.0–34.0)
MCHC: 31.8 g/dL (ref 30.0–36.0)
MCV: 93 fL (ref 80.0–100.0)
Platelets: 165 10*3/uL (ref 150–400)
RBC: 5.03 MIL/uL (ref 4.22–5.81)
RDW: 17.3 % — ABNORMAL HIGH (ref 11.5–15.5)
WBC: 14.6 10*3/uL — ABNORMAL HIGH (ref 4.0–10.5)
nRBC: 0 % (ref 0.0–0.2)

## 2020-07-02 LAB — BASIC METABOLIC PANEL
Anion gap: 14 (ref 5–15)
BUN: 26 mg/dL — ABNORMAL HIGH (ref 8–23)
CO2: 27 mmol/L (ref 22–32)
Calcium: 8.9 mg/dL (ref 8.9–10.3)
Chloride: 96 mmol/L — ABNORMAL LOW (ref 98–111)
Creatinine, Ser: 0.34 mg/dL — ABNORMAL LOW (ref 0.61–1.24)
GFR, Estimated: >60 mL/min (ref >60)
Glucose, Bld: 228 mg/dL — ABNORMAL HIGH (ref 70–99)
Potassium: 4.2 mmol/L (ref 3.5–5.1)
Sodium: 137 mmol/L (ref 135–145)

## 2020-07-02 LAB — CK: Total CK: 44 U/L — ABNORMAL LOW (ref 49–397)

## 2020-07-02 NOTE — Consult Note (Signed)
Ref: Elana Alm, MD   Subjective:  Atrial fibrillation continues. Heart rate in 100-110/min  Objective:  Vital Signs in the last 24 hours: P: 100-110, R: 18, BP: 110/70, O2 sat: 98 %. Arterial Line BP: ()/()   Physical Exam: BP Readings from Last 1 Encounters:  05/06/19 124/79     Wt Readings from Last 1 Encounters:  04/04/14 101.2 kg    Weight change:  There is no height or weight on file to calculate BMI. HEENT: [place]/AT, Eyes-Blue, Conjunctiva-Pink, Sclera-Non-icteric Neck: No JVD, No bruit, Tracheostomy. Lungs:  Clear, Bilateral. Cardiac:  Irregular rhythm, normal S1 and S2, no S3. II/VI systolic murmur. Abdomen:  Soft, non-tender. BS present. Extremities:  No edema present. No cyanosis. No clubbing. CNS: AxOx0, Tries to track with eyes on tapping shoulder and calling name. Involuntary movements of upper body and head continues.  Skin: Warm and dry.   Intake/Output from previous day: No intake/output data recorded.    Lab Results: BMET    Component Value Date/Time   NA 137 07/02/2020 0738   NA 138 06/30/2020 0536   NA 139 06/27/2020 0431   K 4.2 07/02/2020 0738   K 3.7 07/01/2020 1126   K 5.3 (H) 06/30/2020 0536   CL 96 (L) 07/02/2020 0738   CL 98 06/30/2020 0536   CL 100 06/27/2020 0431   CO2 27 07/02/2020 0738   CO2 29 06/30/2020 0536   CO2 28 06/27/2020 0431   GLUCOSE 228 (H) 07/02/2020 0738   GLUCOSE 195 (H) 06/30/2020 0536   GLUCOSE 195 (H) 06/27/2020 0431   BUN 26 (H) 07/02/2020 0738   BUN 23 06/30/2020 0536   BUN 19 06/27/2020 0431   CREATININE 0.34 (L) 07/02/2020 0738   CREATININE <0.30 (L) 06/30/2020 0536   CREATININE <0.30 (L) 06/27/2020 0431   CALCIUM 8.9 07/02/2020 0738   CALCIUM 8.9 06/30/2020 0536   CALCIUM 9.3 06/27/2020 0431   GFRNONAA >60 07/02/2020 0738   GFRNONAA NOT CALCULATED 06/30/2020 0536   GFRNONAA NOT CALCULATED 06/27/2020 0431   CBC    Component Value Date/Time   WBC 14.6 (H) 07/02/2020 0738   RBC 5.03 07/02/2020  0738   HGB 14.9 07/02/2020 0738   HCT 46.8 07/02/2020 0738   PLT 165 07/02/2020 0738   MCV 93.0 07/02/2020 0738   MCH 29.6 07/02/2020 0738   MCHC 31.8 07/02/2020 0738   RDW 17.3 (H) 07/02/2020 0738   LYMPHSABS 2.3 06/06/2020 0418   MONOABS 0.9 06/06/2020 0418   EOSABS 0.0 06/06/2020 0418   BASOSABS 0.1 06/06/2020 0418   HEPATIC Function Panel Recent Labs    06/06/20 0418  PROT 6.8   HEMOGLOBIN A1C No components found for: HGA1C,  MPG CARDIAC ENZYMES Lab Results  Component Value Date   CKTOTAL 44 (L) 07/02/2020   BNP No results for input(s): PROBNP in the last 8760 hours. TSH Recent Labs    06/06/20 0418  TSH 3.257   CHOLESTEROL No results for input(s): CHOL in the last 8760 hours.  Scheduled Meds: Continuous Infusions: PRN Meds:.  Assessment/Plan: Acute on chronic respiratory failure with hypoxia Atrial fibrillation CAD HCM without obstruction Metabolic encephalopathy S/P acute demyelinating polyneuropathy S/P tracheostomy S/P PEG placement S/P pneumonia  Increase Beta-blocker dose marginally and continue 200 mg. Amiodarone daily.   LOS: 0 days   Time spent including chart review, lab review, examination, discussion with patient/Nurse : 30 min   Orpah Cobb  MD  07/02/2020, 9:41 AM

## 2020-07-03 DIAGNOSIS — G901 Familial dysautonomia [Riley-Day]: Secondary | ICD-10-CM | POA: Diagnosis not present

## 2020-07-03 DIAGNOSIS — I4891 Unspecified atrial fibrillation: Secondary | ICD-10-CM | POA: Diagnosis not present

## 2020-07-03 DIAGNOSIS — J9621 Acute and chronic respiratory failure with hypoxia: Secondary | ICD-10-CM | POA: Diagnosis not present

## 2020-07-03 DIAGNOSIS — G610 Guillain-Barre syndrome: Secondary | ICD-10-CM | POA: Diagnosis not present

## 2020-07-03 LAB — VANCOMYCIN, TROUGH: Vancomycin Tr: 15 ug/mL (ref 15–20)

## 2020-07-03 NOTE — Progress Notes (Signed)
Pulmonary Critical Care Medicine [facility] [place]   PULMONARY CRITICAL CARE SERVICE  PROGRESS NOTE  Date of Service: 07/03/2020  Brian Espinoza  JSH:[id number]  DOB: July 22, 1955   DOA: 06/05/2020  Referring Physician: Carron Curie, MD  HPI: Brian Espinoza is a 65 y.o. male seen for follow up of Acute on Chronic Respiratory Failure.  Patient was on the ventilator and full support on pressure control mode discussed with respiratory therapy to try to start weaning again.  Medications: Reviewed on Rounds  Physical Exam:  Vitals: Temperature 97.2 pulse 115 respiratory to 35 blood pressure is 157/91 saturations 98%  Ventilator Settings on pressure assist control FiO2 is 45% IP 14 PEEP 6  . General: Comfortable at this time . Eyes: Grossly normal lids, irises & conjunctiva . ENT: grossly tongue is normal . Neck: no obvious mass . Cardiovascular: S1 S2 normal no gallop . Respiratory: Rhonchi expansion is equal . Abdomen: soft . Skin: no rash seen on limited exam . Musculoskeletal: not rigid . Psychiatric:unable to assess . Neurologic: no seizure no involuntary movements         Lab Data:   Basic Metabolic Panel: Recent Labs  Lab 06/27/20 0431 06/30/20 0536 07/01/20 1126 07/02/20 0738  NA 139 138  --  137  K 4.4 5.3* 3.7 4.2  CL 100 98  --  96*  CO2 28 29  --  27  GLUCOSE 195* 195*  --  228*  BUN 19 23  --  26*  CREATININE <0.30* <0.30*  --  0.34*  CALCIUM 9.3 8.9  --  8.9  MG 1.9  --   --   --   PHOS 3.5  --   --   --     ABG: Recent Labs  Lab 06/30/20 1113  PHART 7.329*  PCO2ART 67.7*  PO2ART 89.7  HCO3 34.6*  O2SAT 96.8    Liver Function Tests: Recent Labs  Lab 06/27/20 0431  ALBUMIN 3.0*   No results for input(s): LIPASE, AMYLASE in the last 168 hours. No results for input(s): AMMONIA in the last 168 hours.  CBC: Recent Labs  Lab 06/27/20 0431 06/30/20 0536 07/02/20 0738  WBC 10.5 10.0 14.6*  HGB 14.8 15.2 14.9  HCT 47.0 47.1 46.8   MCV 94.2 94.0 93.0  PLT 107* 148* 165    Cardiac Enzymes: Recent Labs  Lab 07/02/20 0738  CKTOTAL 44*    BNP (last 3 results) No results for input(s): BNP in the last 8760 hours.  ProBNP (last 3 results) No results for input(s): PROBNP in the last 8760 hours.  Radiological Exams: No results found.  Assessment/Plan Active Problems:   Acute on chronic respiratory failure with hypoxia (HCC)   Acute inflammatory demyelinating polyneuropathy (HCC)   Dysautonomia (HCC)   Atrial fibrillation with RVR (HCC)   Healthcare associated bacterial pneumonia   1. Acute on chronic respiratory failure hypoxia we will continue with resting mode of pressure control respiratory therapy will check the mechanics today to try to see about 2. Acute inflammatory demyelinating the no change we will continue to follow 3. Dysautonomia patient is at baseline 4. Atrial fibrillation rate is controlled 5. Healthcare associated pneumonia treated slowly improving   I have personally seen and evaluated the patient, evaluated laboratory and imaging results, formulated the assessment and plan and placed orders. The Patient requires high complexity decision making with multiple systems involvement.  Rounds were done with the Respiratory Therapy Director and Staff therapists and discussed with nursing staff  also.  Allyne Gee, MD [facility] Pulmonary Critical Care Medicine Sleep Medicine

## 2020-07-04 DIAGNOSIS — J9621 Acute and chronic respiratory failure with hypoxia: Secondary | ICD-10-CM | POA: Diagnosis not present

## 2020-07-04 DIAGNOSIS — G610 Guillain-Barre syndrome: Secondary | ICD-10-CM | POA: Diagnosis not present

## 2020-07-04 DIAGNOSIS — G901 Familial dysautonomia [Riley-Day]: Secondary | ICD-10-CM | POA: Diagnosis not present

## 2020-07-04 DIAGNOSIS — I4891 Unspecified atrial fibrillation: Secondary | ICD-10-CM | POA: Diagnosis not present

## 2020-07-04 LAB — BASIC METABOLIC PANEL
Anion gap: 13 (ref 5–15)
BUN: 34 mg/dL — ABNORMAL HIGH (ref 8–23)
CO2: 26 mmol/L (ref 22–32)
Calcium: 8.6 mg/dL — ABNORMAL LOW (ref 8.9–10.3)
Chloride: 100 mmol/L (ref 98–111)
Creatinine, Ser: 0.49 mg/dL — ABNORMAL LOW (ref 0.61–1.24)
GFR, Estimated: >60 mL/min (ref >60)
Glucose, Bld: 230 mg/dL — ABNORMAL HIGH (ref 70–99)
Potassium: 4.2 mmol/L (ref 3.5–5.1)
Sodium: 139 mmol/L (ref 135–145)

## 2020-07-04 LAB — CBC
HCT: 38.8 % — ABNORMAL LOW (ref 39.0–52.0)
Hemoglobin: 12.4 g/dL — ABNORMAL LOW (ref 13.0–17.0)
MCH: 29.7 pg (ref 26.0–34.0)
MCHC: 32 g/dL (ref 30.0–36.0)
MCV: 93 fL (ref 80.0–100.0)
Platelets: 112 10*3/uL — ABNORMAL LOW (ref 150–400)
RBC: 4.17 MIL/uL — ABNORMAL LOW (ref 4.22–5.81)
RDW: 17.5 % — ABNORMAL HIGH (ref 11.5–15.5)
WBC: 10.7 10*3/uL — ABNORMAL HIGH (ref 4.0–10.5)
nRBC: 0 % (ref 0.0–0.2)

## 2020-07-04 NOTE — Progress Notes (Signed)
Pulmonary Critical Care Medicine [facility] [place]   PULMONARY CRITICAL CARE SERVICE  PROGRESS NOTE  Date of Service: 07/04/2020  Brian Espinoza  MGQ:[id number]  DOB: 05/03/1956   DOA: 06/05/2020  Referring Physician: Carron Curie, MD  HPI: Brian Espinoza is a 65 y.o. male seen for follow up of Acute on Chronic Respiratory Failure.  Patient currently is on pressure support has been on 45% FiO2 currently on a pressure of 12/5  Medications: Reviewed on Rounds  Physical Exam:  Vitals: Temperature is 98.0 pulse 102 respiratory 16 blood pressure is 121/69 saturations 99%  Ventilator Settings on pressure support FiO2 45% pressure 12/5  . General: Comfortable at this time . Eyes: Grossly normal lids, irises & conjunctiva . ENT: grossly tongue is normal . Neck: no obvious mass . Cardiovascular: S1 S2 normal no gallop . Respiratory: Scattered rhonchi expansion is equal . Abdomen: soft . Skin: no rash seen on limited exam . Musculoskeletal: not rigid . Psychiatric:unable to assess . Neurologic: no seizure no involuntary movements         Lab Data:   Basic Metabolic Panel: Recent Labs  Lab 06/30/20 0536 07/01/20 1126 07/02/20 0738 07/04/20 0418  NA 138  --  137 139  K 5.3* 3.7 4.2 4.2  CL 98  --  96* 100  CO2 29  --  27 26  GLUCOSE 195*  --  228* 230*  BUN 23  --  26* 34*  CREATININE <0.30*  --  0.34* 0.49*  CALCIUM 8.9  --  8.9 8.6*    ABG: Recent Labs  Lab 06/30/20 1113  PHART 7.329*  PCO2ART 67.7*  PO2ART 89.7  HCO3 34.6*  O2SAT 96.8    Liver Function Tests: No results for input(s): AST, ALT, ALKPHOS, BILITOT, PROT, ALBUMIN in the last 168 hours. No results for input(s): LIPASE, AMYLASE in the last 168 hours. No results for input(s): AMMONIA in the last 168 hours.  CBC: Recent Labs  Lab 06/30/20 0536 07/02/20 0738 07/04/20 0418  WBC 10.0 14.6* 10.7*  HGB 15.2 14.9 12.4*  HCT 47.1 46.8 38.8*  MCV 94.0 93.0 93.0  PLT 148* 165 112*    Cardiac  Enzymes: Recent Labs  Lab 07/02/20 0738  CKTOTAL 44*    BNP (last 3 results) No results for input(s): BNP in the last 8760 hours.  ProBNP (last 3 results) No results for input(s): PROBNP in the last 8760 hours.  Radiological Exams: No results found.  Assessment/Plan Active Problems:   Acute on chronic respiratory failure with hypoxia (HCC)   Acute inflammatory demyelinating polyneuropathy (HCC)   Dysautonomia (HCC)   Atrial fibrillation with RVR (HCC)   Healthcare associated bacterial pneumonia   1. Acute on chronic respiratory failure hypoxia we will continue with pressure support titrate oxygen continue pulmonary toilet.  Patient's goal is for 4 hours 2. Acute inflammatory demyelinating polyneuropathy no change we will continue to monitor 3. Dysautonomia seems to be doing better 4. Atrial fibrillation rate controlled 5. Healthcare associated pneumonia treated slow improved   I have personally seen and evaluated the patient, evaluated laboratory and imaging results, formulated the assessment and plan and placed orders. The Patient requires high complexity decision making with multiple systems involvement.  Rounds were done with the Respiratory Therapy Director and Staff therapists and discussed with nursing staff also.  Yevonne Pax, MD [facility] Pulmonary Critical Care Medicine Sleep Medicine

## 2020-07-05 DIAGNOSIS — G901 Familial dysautonomia [Riley-Day]: Secondary | ICD-10-CM | POA: Diagnosis not present

## 2020-07-05 DIAGNOSIS — J9621 Acute and chronic respiratory failure with hypoxia: Secondary | ICD-10-CM | POA: Diagnosis not present

## 2020-07-05 DIAGNOSIS — I4891 Unspecified atrial fibrillation: Secondary | ICD-10-CM | POA: Diagnosis not present

## 2020-07-05 DIAGNOSIS — G610 Guillain-Barre syndrome: Secondary | ICD-10-CM | POA: Diagnosis not present

## 2020-07-05 LAB — CULTURE, BLOOD (ROUTINE X 2)
Culture: NO GROWTH
Culture: NO GROWTH
Special Requests: ADEQUATE

## 2020-07-05 NOTE — Progress Notes (Signed)
Pulmonary Critical Care Medicine [facility] [place]   PULMONARY CRITICAL CARE SERVICE  PROGRESS NOTE  Date of Service: 07/05/2020  Ulysees Robarts  MHD:[id number]  DOB: [birth year]   DOA: 06/05/2020  Referring Physician: Carron Curie, MD  HPI: Jashaun Penrose is a 65 y.o. male seen for follow up of Acute on Chronic Respiratory Failure.  Patient is on pressure support mode currently on 12/5 appears to be comfortable no distress  Medications: Reviewed on Rounds  Physical Exam:  Vitals: Temperature is 98.2 pulse 72 respiratory 14 blood pressure is 122/77 saturations 99%  Ventilator Settings on pressure support FiO2 40% pressure 12/5  . General: Comfortable at this time . Eyes: Grossly normal lids, irises & conjunctiva . ENT: grossly tongue is normal . Neck: no obvious mass . Cardiovascular: S1 S2 normal no gallop . Respiratory: No rhonchi very coarse breath sounds . Abdomen: soft . Skin: no rash seen on limited exam . Musculoskeletal: not rigid . Psychiatric:unable to assess . Neurologic: no seizure no involuntary movements         Lab Data:   Basic Metabolic Panel: Recent Labs  Lab 06/30/20 0536 07/01/20 1126 07/02/20 0738 07/04/20 0418  NA 138  --  137 139  K 5.3* 3.7 4.2 4.2  CL 98  --  96* 100  CO2 29  --  27 26  GLUCOSE 195*  --  228* 230*  BUN 23  --  26* 34*  CREATININE <0.30*  --  0.34* 0.49*  CALCIUM 8.9  --  8.9 8.6*    ABG: Recent Labs  Lab 06/30/20 1113  PHART 7.329*  PCO2ART 67.7*  PO2ART 89.7  HCO3 34.6*  O2SAT 96.8    Liver Function Tests: No results for input(s): AST, ALT, ALKPHOS, BILITOT, PROT, ALBUMIN in the last 168 hours. No results for input(s): LIPASE, AMYLASE in the last 168 hours. No results for input(s): AMMONIA in the last 168 hours.  CBC: Recent Labs  Lab 06/30/20 0536 07/02/20 0738 07/04/20 0418  WBC 10.0 14.6* 10.7*  HGB 15.2 14.9 12.4*  HCT 47.1 46.8 38.8*  MCV 94.0 93.0 93.0  PLT 148* 165 112*    Cardiac  Enzymes: Recent Labs  Lab 07/02/20 0738  CKTOTAL 44*    BNP (last 3 results) No results for input(s): BNP in the last 8760 hours.  ProBNP (last 3 results) No results for input(s): PROBNP in the last 8760 hours.  Radiological Exams: No results found.  Assessment/Plan Active Problems:   Acute on chronic respiratory failure with hypoxia (HCC)   Acute inflammatory demyelinating polyneuropathy (HCC)   Dysautonomia (HCC)   Atrial fibrillation with RVR (HCC)   Healthcare associated bacterial pneumonia   1. Acute on chronic respiratory failure hypoxia we will continue with pressure support wean 12/5 2. Acute inflammatory demyelinating polyneuropathy no change continue supportive care 3. Dysautonomia dynamics are stable 4. Atrial fibrillation rate controlled 5. Healthcare associated pneumonia treated we will continue to monitor   I have personally seen and evaluated the patient, evaluated laboratory and imaging results, formulated the assessment and plan and placed orders. The Patient requires high complexity decision making with multiple systems involvement.  Rounds were done with the Respiratory Therapy Director and Staff therapists and discussed with nursing staff also.  Yevonne Pax, MD [facility] Pulmonary Critical Care Medicine Sleep Medicine

## 2020-07-06 DIAGNOSIS — I4891 Unspecified atrial fibrillation: Secondary | ICD-10-CM | POA: Diagnosis not present

## 2020-07-06 DIAGNOSIS — G901 Familial dysautonomia [Riley-Day]: Secondary | ICD-10-CM | POA: Diagnosis not present

## 2020-07-06 DIAGNOSIS — J9621 Acute and chronic respiratory failure with hypoxia: Secondary | ICD-10-CM | POA: Diagnosis not present

## 2020-07-06 DIAGNOSIS — G610 Guillain-Barre syndrome: Secondary | ICD-10-CM | POA: Diagnosis not present

## 2020-07-06 NOTE — Progress Notes (Signed)
Pulmonary Critical Care Medicine [facility] [place]   PULMONARY CRITICAL CARE SERVICE  PROGRESS NOTE  Date of Service: 07/06/2020  Brian Espinoza  OZH:[id number]  DOB: [birth year]   DOA: 06/05/2020  Referring Physician: Carron Curie, MD  HPI: Brian Espinoza is a 65 y.o. male seen for follow up of Acute on Chronic Respiratory Failure.  Patient is currently on pressure support has been on 40% FiO2 with a goal of 16 hours  Medications: Reviewed on Rounds  Physical Exam:  Vitals: Temperature is 96.3 pulse 75 respiratory 18 blood pressure is 108/55 saturations 100%  Ventilator Settings on pressure support FiO2 40% pressure 12/5  . General: Comfortable at this time . Eyes: Grossly normal lids, irises & conjunctiva . ENT: grossly tongue is normal . Neck: no obvious mass . Cardiovascular: S1 S2 normal no gallop . Respiratory: Scattered rhonchi expansion is equal . Abdomen: soft . Skin: no rash seen on limited exam . Musculoskeletal: not rigid . Psychiatric:unable to assess . Neurologic: no seizure no involuntary movements         Lab Data:   Basic Metabolic Panel: Recent Labs  Lab 06/30/20 0536 07/01/20 1126 07/02/20 0738 07/04/20 0418  NA 138  --  137 139  K 5.3* 3.7 4.2 4.2  CL 98  --  96* 100  CO2 29  --  27 26  GLUCOSE 195*  --  228* 230*  BUN 23  --  26* 34*  CREATININE <0.30*  --  0.34* 0.49*  CALCIUM 8.9  --  8.9 8.6*    ABG: Recent Labs  Lab 06/30/20 1113  PHART 7.329*  PCO2ART 67.7*  PO2ART 89.7  HCO3 34.6*  O2SAT 96.8    Liver Function Tests: No results for input(s): AST, ALT, ALKPHOS, BILITOT, PROT, ALBUMIN in the last 168 hours. No results for input(s): LIPASE, AMYLASE in the last 168 hours. No results for input(s): AMMONIA in the last 168 hours.  CBC: Recent Labs  Lab 06/30/20 0536 07/02/20 0738 07/04/20 0418  WBC 10.0 14.6* 10.7*  HGB 15.2 14.9 12.4*  HCT 47.1 46.8 38.8*  MCV 94.0 93.0 93.0  PLT 148* 165 112*    Cardiac  Enzymes: Recent Labs  Lab 07/02/20 0738  CKTOTAL 44*    BNP (last 3 results) No results for input(s): BNP in the last 8760 hours.  ProBNP (last 3 results) No results for input(s): PROBNP in the last 8760 hours.  Radiological Exams: No results found.  Assessment/Plan Active Problems:   Acute on chronic respiratory failure with hypoxia (HCC)   Acute inflammatory demyelinating polyneuropathy (HCC)   Dysautonomia (HCC)   Atrial fibrillation with RVR (HCC)   Healthcare associated bacterial pneumonia   1. Acute on chronic respiratory failure hypoxia we will continue with pressure support titrate oxygen continue pulmonary toilet. 2. Acute inflammatory demyelinating polyneuropathy no change we will continue to follow 3. Dysautonomia no change continue with present management 4. Atrial fibrillation rate is controlled 5. Healthcare associated pneumonia treated we will continue to follow   I have personally seen and evaluated the patient, evaluated laboratory and imaging results, formulated the assessment and plan and placed orders. The Patient requires high complexity decision making with multiple systems involvement.  Rounds were done with the Respiratory Therapy Director and Staff therapists and discussed with nursing staff also.  Yevonne Pax, MD [facility] Pulmonary Critical Care Medicine Sleep Medicine

## 2020-07-07 DIAGNOSIS — G610 Guillain-Barre syndrome: Secondary | ICD-10-CM | POA: Diagnosis not present

## 2020-07-07 DIAGNOSIS — G901 Familial dysautonomia [Riley-Day]: Secondary | ICD-10-CM | POA: Diagnosis not present

## 2020-07-07 DIAGNOSIS — J9621 Acute and chronic respiratory failure with hypoxia: Secondary | ICD-10-CM | POA: Diagnosis not present

## 2020-07-07 DIAGNOSIS — I4891 Unspecified atrial fibrillation: Secondary | ICD-10-CM | POA: Diagnosis not present

## 2020-07-07 NOTE — Progress Notes (Signed)
Pulmonary Critical Care Medicine [facility] [place]   PULMONARY CRITICAL CARE SERVICE  PROGRESS NOTE  Date of Service: 07/07/2020  Brian Espinoza  HFW:[id number]  DOB: [birth year]   DOA: 06/05/2020  Referring Physician: Carron Curie, MD  HPI: Brian Espinoza is a 65 y.o. male seen for follow up of Acute on Chronic Respiratory Failure.  Patient is on T collar currently on 40% FiO2 with a goal of 4 hours today  Medications: Reviewed on Rounds  Physical Exam:  Vitals: Temperature is 96.1 pulse 76 respiratory 20 blood pressure is 106/61 saturations 100%  Ventilator Settings on T collar with an FiO2 of 40%  . General: Comfortable at this time . Eyes: Grossly normal lids, irises & conjunctiva . ENT: grossly tongue is normal . Neck: no obvious mass . Cardiovascular: S1 S2 normal no gallop . Respiratory: Scattered rhonchi expansion equal . Abdomen: soft . Skin: no rash seen on limited exam . Musculoskeletal: not rigid . Psychiatric:unable to assess . Neurologic: no seizure no involuntary movements         Lab Data:   Basic Metabolic Panel: Recent Labs  Lab 07/01/20 1126 07/02/20 0738 07/04/20 0418  NA  --  137 139  K 3.7 4.2 4.2  CL  --  96* 100  CO2  --  27 26  GLUCOSE  --  228* 230*  BUN  --  26* 34*  CREATININE  --  0.34* 0.49*  CALCIUM  --  8.9 8.6*    ABG: Recent Labs  Lab 06/30/20 1113  PHART 7.329*  PCO2ART 67.7*  PO2ART 89.7  HCO3 34.6*  O2SAT 96.8    Liver Function Tests: No results for input(s): AST, ALT, ALKPHOS, BILITOT, PROT, ALBUMIN in the last 168 hours. No results for input(s): LIPASE, AMYLASE in the last 168 hours. No results for input(s): AMMONIA in the last 168 hours.  CBC: Recent Labs  Lab 07/02/20 0738 07/04/20 0418  WBC 14.6* 10.7*  HGB 14.9 12.4*  HCT 46.8 38.8*  MCV 93.0 93.0  PLT 165 112*    Cardiac Enzymes: Recent Labs  Lab 07/02/20 0738  CKTOTAL 44*    BNP (last 3 results) No results for input(s): BNP in the  last 8760 hours.  ProBNP (last 3 results) No results for input(s): PROBNP in the last 8760 hours.  Radiological Exams: No results found.  Assessment/Plan Active Problems:   Acute on chronic respiratory failure with hypoxia (HCC)   Acute inflammatory demyelinating polyneuropathy (HCC)   Dysautonomia (HCC)   Atrial fibrillation with RVR (HCC)   Healthcare associated bacterial pneumonia   1. Acute on chronic respiratory failure hypoxia we will continue with T collar titrate oxygen continue pulmonary toilet 2. Acute inflammatory demyelinating polyneuropathy family is requesting a neurological consultation primary care team is working on trying to get consult 3. Dysautonomia no changes 4. Atrial fibrillation rate controlled 5. Healthcare associated pneumonia treated   I have personally seen and evaluated the patient, evaluated laboratory and imaging results, formulated the assessment and plan and placed orders. The Patient requires high complexity decision making with multiple systems involvement.  Rounds were done with the Respiratory Therapy Director and Staff therapists and discussed with nursing staff also.  Yevonne Pax, MD [facility] Pulmonary Critical Care Medicine Sleep Medicine

## 2020-07-09 DIAGNOSIS — G610 Guillain-Barre syndrome: Secondary | ICD-10-CM | POA: Diagnosis not present

## 2020-07-09 DIAGNOSIS — I4891 Unspecified atrial fibrillation: Secondary | ICD-10-CM | POA: Diagnosis not present

## 2020-07-09 DIAGNOSIS — G901 Familial dysautonomia [Riley-Day]: Secondary | ICD-10-CM | POA: Diagnosis not present

## 2020-07-09 DIAGNOSIS — J9621 Acute and chronic respiratory failure with hypoxia: Secondary | ICD-10-CM | POA: Diagnosis not present

## 2020-07-09 NOTE — Progress Notes (Signed)
Pulmonary Critical Care Medicine [facility] [place]   PULMONARY CRITICAL CARE SERVICE  PROGRESS NOTE  Date of Service: 07/09/2020  Brian Espinoza  CVE:[id number]  DOB: 05-Oct-1955   DOA: 06/05/2020  Referring Physician: Carron Curie, MD  HPI: Brian Espinoza is a 65 y.o. male seen for follow up of Acute on Chronic Respiratory Failure.  Patient is currently on T collar has been on 35% FiO2 goal is for 12 hours today  Medications: Reviewed on Rounds  Physical Exam:  Vitals: Temperature 97.7 pulse 96 respiratory 12 blood pressure is 89/63 saturations 100%  Ventilator Settings on T collar with an FiO2 35%  . General: Comfortable at this time . Eyes: Grossly normal lids, irises & conjunctiva . ENT: grossly tongue is normal . Neck: no obvious mass . Cardiovascular: S1 S2 normal no gallop . Respiratory: Scattered rhonchi expansion is equal . Abdomen: soft . Skin: no rash seen on limited exam . Musculoskeletal: not rigid . Psychiatric:unable to assess . Neurologic: no seizure no involuntary movements         Lab Data:   Basic Metabolic Panel: Recent Labs  Lab 07/04/20 0418  NA 139  K 4.2  CL 100  CO2 26  GLUCOSE 230*  BUN 34*  CREATININE 0.49*  CALCIUM 8.6*    ABG: No results for input(s): PHART, PCO2ART, PO2ART, HCO3, O2SAT in the last 168 hours.  Liver Function Tests: No results for input(s): AST, ALT, ALKPHOS, BILITOT, PROT, ALBUMIN in the last 168 hours. No results for input(s): LIPASE, AMYLASE in the last 168 hours. No results for input(s): AMMONIA in the last 168 hours.  CBC: Recent Labs  Lab 07/04/20 0418  WBC 10.7*  HGB 12.4*  HCT 38.8*  MCV 93.0  PLT 112*    Cardiac Enzymes: No results for input(s): CKTOTAL, CKMB, CKMBINDEX, TROPONINI in the last 168 hours.  BNP (last 3 results) No results for input(s): BNP in the last 8760 hours.  ProBNP (last 3 results) No results for input(s): PROBNP in the last 8760 hours.  Radiological Exams: No  results found.  Assessment/Plan Active Problems:   Acute on chronic respiratory failure with hypoxia (HCC)   Acute inflammatory demyelinating polyneuropathy (HCC)   Dysautonomia (HCC)   Atrial fibrillation with RVR (HCC)   Healthcare associated bacterial pneumonia   1. Acute on chronic respiratory failure hypoxia we will continue with T collar trials currently on 35% FiO2 goal 12 hours 2. Acute inflammatory demyelinating polyneuropathy no change continue supportive care 3. Dysautonomia supportive care 4. Atrial fibrillation rate is controlled 5. Healthcare associated pneumonia treated slowly improving   I have personally seen and evaluated the patient, evaluated laboratory and imaging results, formulated the assessment and plan and placed orders. The Patient requires high complexity decision making with multiple systems involvement.  Rounds were done with the Respiratory Therapy Director and Staff therapists and discussed with nursing staff also.  Yevonne Pax, MD [facility] Pulmonary Critical Care Medicine Sleep Medicine

## 2020-07-10 ENCOUNTER — Other Ambulatory Visit (HOSPITAL_COMMUNITY): Payer: Medicare Other

## 2020-07-10 DIAGNOSIS — G610 Guillain-Barre syndrome: Secondary | ICD-10-CM | POA: Diagnosis not present

## 2020-07-10 DIAGNOSIS — G901 Familial dysautonomia [Riley-Day]: Secondary | ICD-10-CM | POA: Diagnosis not present

## 2020-07-10 DIAGNOSIS — J9621 Acute and chronic respiratory failure with hypoxia: Secondary | ICD-10-CM | POA: Diagnosis not present

## 2020-07-10 DIAGNOSIS — I4891 Unspecified atrial fibrillation: Secondary | ICD-10-CM | POA: Diagnosis not present

## 2020-07-10 LAB — CBC
HCT: 40.1 % (ref 39.0–52.0)
Hemoglobin: 13 g/dL (ref 13.0–17.0)
MCH: 29 pg (ref 26.0–34.0)
MCHC: 32.4 g/dL (ref 30.0–36.0)
MCV: 89.3 fL (ref 80.0–100.0)
Platelets: 112 10*3/uL — ABNORMAL LOW (ref 150–400)
RBC: 4.49 MIL/uL (ref 4.22–5.81)
RDW: 17.2 % — ABNORMAL HIGH (ref 11.5–15.5)
WBC: 10.3 10*3/uL (ref 4.0–10.5)
nRBC: 0 % (ref 0.0–0.2)

## 2020-07-10 LAB — BASIC METABOLIC PANEL
Anion gap: 11 (ref 5–15)
BUN: 20 mg/dL (ref 8–23)
CO2: 26 mmol/L (ref 22–32)
Calcium: 8.9 mg/dL (ref 8.9–10.3)
Chloride: 101 mmol/L (ref 98–111)
Creatinine, Ser: 0.39 mg/dL — ABNORMAL LOW (ref 0.61–1.24)
GFR, Estimated: >60 mL/min (ref >60)
Glucose, Bld: 230 mg/dL — ABNORMAL HIGH (ref 70–99)
Potassium: 4.1 mmol/L (ref 3.5–5.1)
Sodium: 138 mmol/L (ref 135–145)

## 2020-07-10 NOTE — Progress Notes (Signed)
Pulmonary Critical Care Medicine [facility] [place]   PULMONARY CRITICAL CARE SERVICE  PROGRESS NOTE  Date of Service: 07/10/2020  Brian Espinoza  [id number]  DOB: August 06, 1955   DOA: 06/05/2020  Referring Physician: Carron Curie, MD  HPI: Brian Espinoza is a 65 y.o. male seen for follow up of Acute on Chronic Respiratory Failure.  Was attempted on T collar patient did not tolerate placed back on the ventilator.  Medications: Reviewed on Rounds  Physical Exam:  Vitals: Temperature 96.2 pulse 95 respiratory 20 blood pressure is 121/84 saturations 100%  Ventilator Settings on pressure control FiO2 35% IP 14 PEEP 6  . General: Comfortable at this time . Eyes: Grossly normal lids, irises & conjunctiva . ENT: grossly tongue is normal . Neck: no obvious mass . Cardiovascular: S1 S2 normal no gallop . Respiratory: Scattered rhonchi expansion is equal . Abdomen: soft . Skin: no rash seen on limited exam . Musculoskeletal: not rigid . Psychiatric:unable to assess . Neurologic: no seizure no involuntary movements         Lab Data:   Basic Metabolic Panel: Recent Labs  Lab 07/04/20 0418 07/10/20 0403  NA 139 138  K 4.2 4.1  CL 100 101  CO2 26 26  GLUCOSE 230* 230*  BUN 34* 20  CREATININE 0.49* 0.39*  CALCIUM 8.6* 8.9    ABG: No results for input(s): PHART, PCO2ART, PO2ART, HCO3, O2SAT in the last 168 hours.  Liver Function Tests: No results for input(s): AST, ALT, ALKPHOS, BILITOT, PROT, ALBUMIN in the last 168 hours. No results for input(s): LIPASE, AMYLASE in the last 168 hours. No results for input(s): AMMONIA in the last 168 hours.  CBC: Recent Labs  Lab 07/04/20 0418 07/10/20 0403  WBC 10.7* 10.3  HGB 12.4* 13.0  HCT 38.8* 40.1  MCV 93.0 89.3  PLT 112* 112*    Cardiac Enzymes: No results for input(s): CKTOTAL, CKMB, CKMBINDEX, TROPONINI in the last 168 hours.  BNP (last 3 results) No results for input(s): BNP in the last 8760 hours.  ProBNP  (last 3 results) No results for input(s): PROBNP in the last 8760 hours.  Radiological Exams: DG CHEST PORT 1 VIEW  Result Date: 07/10/2020 CLINICAL DATA:  Respiratory failure EXAM: PORTABLE CHEST 1 VIEW COMPARISON:  Eleven days ago FINDINGS: Indistinct opacity at the lung bases. No edema, effusion, or pneumothorax. Normal heart size and mediastinal contours. Tracheostomy tube in place. Left PICC with tip at the SVC. Artifact from EKG leads. IMPRESSION: Similar mild opacity at the bases favoring atelectasis. Electronically Signed   By: Marnee Spring M.D.   On: 07/10/2020 06:49    Assessment/Plan Active Problems:   Acute on chronic respiratory failure with hypoxia (HCC)   Acute inflammatory demyelinating polyneuropathy (HCC)   Dysautonomia (HCC)   Atrial fibrillation with RVR (HCC)   Healthcare associated bacterial pneumonia   1. Acute on chronic respiratory failure hypoxia we will continue with pressure control titrate oxygen as tolerated.  Attempt T collar failed will reassess 2. Acute inflammatory demyelinating polyneuropathy no change continue support 3. Dysautonomia no change blood pressure more stable 4. Atrial fibrillation rate controlled 5. Healthcare associated pneumonia treated with antibiotics   I have personally seen and evaluated the patient, evaluated laboratory and imaging results, formulated the assessment and plan and placed orders. The Patient requires high complexity decision making with multiple systems involvement.  Rounds were done with the Respiratory Therapy Director and Staff therapists and discussed with nursing staff also.  Yevonne Pax,  MD [facility] Pulmonary Critical Care Medicine Sleep Medicine

## 2020-07-11 DIAGNOSIS — G901 Familial dysautonomia [Riley-Day]: Secondary | ICD-10-CM | POA: Diagnosis not present

## 2020-07-11 DIAGNOSIS — G610 Guillain-Barre syndrome: Secondary | ICD-10-CM | POA: Diagnosis not present

## 2020-07-11 DIAGNOSIS — J9621 Acute and chronic respiratory failure with hypoxia: Secondary | ICD-10-CM | POA: Diagnosis not present

## 2020-07-11 DIAGNOSIS — I4891 Unspecified atrial fibrillation: Secondary | ICD-10-CM | POA: Diagnosis not present

## 2020-07-11 NOTE — Progress Notes (Signed)
Pulmonary Critical Care Medicine [facility] [place]   PULMONARY CRITICAL CARE SERVICE  PROGRESS NOTE  Date of Service: 07/11/2020  Zong Mcquarrie  NIO:[id number]  DOB: 08-Apr-1956   DOA: 06/05/2020  Referring Physician: Carron Curie, MD  HPI: Garcia Dalzell is a 65 y.o. male seen for follow up of Acute on Chronic Respiratory Failure.  Patient currently is on pressure support has been on 35% FiO2 currently on a pressure of 12/5  Medications: Reviewed on Rounds  Physical Exam:  Vitals: Temperature is 96.8 pulse 102 respiratory rate is 22 blood pressure is 153/81 saturations 100%  Ventilator Settings on pressure support FiO2 35% pressure 12/5  . General: Comfortable at this time . Eyes: Grossly normal lids, irises & conjunctiva . ENT: grossly tongue is normal . Neck: no obvious mass . Cardiovascular: S1 S2 normal no gallop . Respiratory: Scattered coarse rhonchi noted bilaterally . Abdomen: soft . Skin: no rash seen on limited exam . Musculoskeletal: not rigid . Psychiatric:unable to assess . Neurologic: no seizure no involuntary movements         Lab Data:   Basic Metabolic Panel: Recent Labs  Lab 07/10/20 0403  NA 138  K 4.1  CL 101  CO2 26  GLUCOSE 230*  BUN 20  CREATININE 0.39*  CALCIUM 8.9    ABG: No results for input(s): PHART, PCO2ART, PO2ART, HCO3, O2SAT in the last 168 hours.  Liver Function Tests: No results for input(s): AST, ALT, ALKPHOS, BILITOT, PROT, ALBUMIN in the last 168 hours. No results for input(s): LIPASE, AMYLASE in the last 168 hours. No results for input(s): AMMONIA in the last 168 hours.  CBC: Recent Labs  Lab 07/10/20 0403  WBC 10.3  HGB 13.0  HCT 40.1  MCV 89.3  PLT 112*    Cardiac Enzymes: No results for input(s): CKTOTAL, CKMB, CKMBINDEX, TROPONINI in the last 168 hours.  BNP (last 3 results) No results for input(s): BNP in the last 8760 hours.  ProBNP (last 3 results) No results for input(s): PROBNP in the last  8760 hours.  Radiological Exams: DG CHEST PORT 1 VIEW  Result Date: 07/10/2020 CLINICAL DATA:  Respiratory failure EXAM: PORTABLE CHEST 1 VIEW COMPARISON:  Eleven days ago FINDINGS: Indistinct opacity at the lung bases. No edema, effusion, or pneumothorax. Normal heart size and mediastinal contours. Tracheostomy tube in place. Left PICC with tip at the SVC. Artifact from EKG leads. IMPRESSION: Similar mild opacity at the bases favoring atelectasis. Electronically Signed   By: Marnee Spring M.D.   On: 07/10/2020 06:49    Assessment/Plan Active Problems:   Acute on chronic respiratory failure with hypoxia (HCC)   Acute inflammatory demyelinating polyneuropathy (HCC)   Dysautonomia (HCC)   Atrial fibrillation with RVR (HCC)   Healthcare associated bacterial pneumonia   1. Acute on chronic respiratory failure hypoxia we will proceed with the weaning should be able to go to T collar 2. Acute inflammatory demyelinating polyneuropathy no change we will continue with supportive care 3. Atrial fibrillation rate is controlled 4. Dysautonomia no changes 5. Healthcare associated pneumonia has been treated   I have personally seen and evaluated the patient, evaluated laboratory and imaging results, formulated the assessment and plan and placed orders. The Patient requires high complexity decision making with multiple systems involvement.  Rounds were done with the Respiratory Therapy Director and Staff therapists and discussed with nursing staff also.  Yevonne Pax, MD [facility] Pulmonary Critical Care Medicine Sleep Medicine

## 2020-07-12 DIAGNOSIS — G901 Familial dysautonomia [Riley-Day]: Secondary | ICD-10-CM | POA: Diagnosis not present

## 2020-07-12 DIAGNOSIS — J9621 Acute and chronic respiratory failure with hypoxia: Secondary | ICD-10-CM | POA: Diagnosis not present

## 2020-07-12 DIAGNOSIS — I4891 Unspecified atrial fibrillation: Secondary | ICD-10-CM | POA: Diagnosis not present

## 2020-07-12 DIAGNOSIS — G610 Guillain-Barre syndrome: Secondary | ICD-10-CM | POA: Diagnosis not present

## 2020-07-12 NOTE — Progress Notes (Signed)
Pulmonary Critical Care Medicine [facility] [place]   PULMONARY CRITICAL CARE SERVICE  PROGRESS NOTE  Date of Service: 07/12/2020  Brian Espinoza  UXL:[id number]  DOB: June 16, 1955   DOA: 06/05/2020  Referring Physician: Carron Curie, MD  HPI: Brian Espinoza is a 65 y.o. male seen for follow up of Acute on Chronic Respiratory Failure.  Patient currently is on T collar on 35% FiO2 the goal today is for 12 hours  Medications: Reviewed on Rounds  Physical Exam:  Vitals: Temperature is 98.0 pulse 73 respiratory 31 blood pressure is 102/62 saturations 99%  Ventilator Settings off the ventilator on T collar currently on 35% FiO2  . General: Comfortable at this time . Eyes: Grossly normal lids, irises & conjunctiva . ENT: grossly tongue is normal . Neck: no obvious mass . Cardiovascular: S1 S2 normal no gallop . Respiratory: Scattered rhonchi expansion is equal . Abdomen: soft . Skin: no rash seen on limited exam . Musculoskeletal: not rigid . Psychiatric:unable to assess . Neurologic: no seizure no involuntary movements         Lab Data:   Basic Metabolic Panel: Recent Labs  Lab 07/10/20 0403  NA 138  K 4.1  CL 101  CO2 26  GLUCOSE 230*  BUN 20  CREATININE 0.39*  CALCIUM 8.9    ABG: No results for input(s): PHART, PCO2ART, PO2ART, HCO3, O2SAT in the last 168 hours.  Liver Function Tests: No results for input(s): AST, ALT, ALKPHOS, BILITOT, PROT, ALBUMIN in the last 168 hours. No results for input(s): LIPASE, AMYLASE in the last 168 hours. No results for input(s): AMMONIA in the last 168 hours.  CBC: Recent Labs  Lab 07/10/20 0403  WBC 10.3  HGB 13.0  HCT 40.1  MCV 89.3  PLT 112*    Cardiac Enzymes: No results for input(s): CKTOTAL, CKMB, CKMBINDEX, TROPONINI in the last 168 hours.  BNP (last 3 results) No results for input(s): BNP in the last 8760 hours.  ProBNP (last 3 results) No results for input(s): PROBNP in the last 8760  hours.  Radiological Exams: No results found.  Assessment/Plan Active Problems:   Acute on chronic respiratory failure with hypoxia (HCC)   Acute inflammatory demyelinating polyneuropathy (HCC)   Dysautonomia (HCC)   Atrial fibrillation with RVR (HCC)   Healthcare associated bacterial pneumonia   1. Acute on chronic respiratory failure hypoxia plan is to continue with the T collar titrate oxygen continue pulmonary toilet titrate oxygen 2. Atrial fibrillation rate is controlled 3. Healthcare associated pneumonia is treated we will continue to follow that 4. Acute inflammatory demyelinating polyneuropathy no change we will continue with supportive care 5. Dysautonomia heart rates been better controlled   I have personally seen and evaluated the patient, evaluated laboratory and imaging results, formulated the assessment and plan and placed orders. The Patient requires high complexity decision making with multiple systems involvement.  Rounds were done with the Respiratory Therapy Director and Staff therapists and discussed with nursing staff also.  Yevonne Pax, MD [facility] Pulmonary Critical Care Medicine Sleep Medicine

## 2020-07-13 DIAGNOSIS — G610 Guillain-Barre syndrome: Secondary | ICD-10-CM | POA: Diagnosis not present

## 2020-07-13 DIAGNOSIS — G901 Familial dysautonomia [Riley-Day]: Secondary | ICD-10-CM | POA: Diagnosis not present

## 2020-07-13 DIAGNOSIS — I4891 Unspecified atrial fibrillation: Secondary | ICD-10-CM | POA: Diagnosis not present

## 2020-07-13 DIAGNOSIS — J9621 Acute and chronic respiratory failure with hypoxia: Secondary | ICD-10-CM | POA: Diagnosis not present

## 2020-07-13 NOTE — Progress Notes (Signed)
Pulmonary Critical Care Medicine [facility] [place]   PULMONARY CRITICAL CARE SERVICE  PROGRESS NOTE  Date of Service: 07/13/2020  Brian Espinoza  OYD:[id number]  DOB: 09-17-1955   DOA: 06/05/2020  Referring Physician: Carron Curie, MD  HPI: Brian Espinoza is a 65 y.o. male seen for follow up of Acute on Chronic Respiratory Failure.  Patient currently is on the ventilator at that time on full support.  Patient apparently had a rapid response yesterday with mucous plugging while the patient had been weaning likely due to poor cough  Medications: Reviewed on Rounds  Physical Exam:  Vitals: Temperature is 98.0 pulse 96 respiratory 19 blood pressure is 133/83 saturations 97%  Ventilator Settings on the ventilator pressure assist control FiO2 is 45% IP 14 PEEP 5  . General: Comfortable at this time . Eyes: Grossly normal lids, irises & conjunctiva . ENT: grossly tongue is normal . Neck: no obvious mass . Cardiovascular: S1 S2 normal no gallop . Respiratory: Scattered rhonchi coarse breath sound . Abdomen: soft . Skin: no rash seen on limited exam . Musculoskeletal: not rigid . Psychiatric:unable to assess . Neurologic: no seizure no involuntary movements         Lab Data:   Basic Metabolic Panel: Recent Labs  Lab 07/10/20 0403  NA 138  K 4.1  CL 101  CO2 26  GLUCOSE 230*  BUN 20  CREATININE 0.39*  CALCIUM 8.9    ABG: No results for input(s): PHART, PCO2ART, PO2ART, HCO3, O2SAT in the last 168 hours.  Liver Function Tests: No results for input(s): AST, ALT, ALKPHOS, BILITOT, PROT, ALBUMIN in the last 168 hours. No results for input(s): LIPASE, AMYLASE in the last 168 hours. No results for input(s): AMMONIA in the last 168 hours.  CBC: Recent Labs  Lab 07/10/20 0403  WBC 10.3  HGB 13.0  HCT 40.1  MCV 89.3  PLT 112*    Cardiac Enzymes: No results for input(s): CKTOTAL, CKMB, CKMBINDEX, TROPONINI in the last 168 hours.  BNP (last 3 results) No  results for input(s): BNP in the last 8760 hours.  ProBNP (last 3 results) No results for input(s): PROBNP in the last 8760 hours.  Radiological Exams: No results found.  Assessment/Plan Active Problems:   Acute on chronic respiratory failure with hypoxia (HCC)   Acute inflammatory demyelinating polyneuropathy (HCC)   Dysautonomia (HCC)   Atrial fibrillation with RVR (HCC)   Healthcare associated bacterial pneumonia   1. Acute on chronic respiratory failure hypoxia plan is to continue with the full support on the ventilator patient had rapid response yesterday right now is on pressure assist control FiO2 45% IP 14 PEEP 5 2. Acute inflammatory demyelinating polyneuropathy guarded prognosis still significantly weak likely having mucous plugging due to poor cough when the patient is off the ventilator support 3. Dysautonomia supportive care 4. Atrial fibrillation rate controlled 5. Healthcare associated pneumonia treated we will continue to monitor closely   I have personally seen and evaluated the patient, evaluated laboratory and imaging results, formulated the assessment and plan and placed orders. The Patient requires high complexity decision making with multiple systems involvement.  Rounds were done with the Respiratory Therapy Director and Staff therapists and discussed with nursing staff also.  Yevonne Pax, MD [facility] Pulmonary Critical Care Medicine Sleep Medicine

## 2020-07-14 DIAGNOSIS — J9621 Acute and chronic respiratory failure with hypoxia: Secondary | ICD-10-CM | POA: Diagnosis not present

## 2020-07-14 DIAGNOSIS — G901 Familial dysautonomia [Riley-Day]: Secondary | ICD-10-CM | POA: Diagnosis not present

## 2020-07-14 DIAGNOSIS — I4891 Unspecified atrial fibrillation: Secondary | ICD-10-CM | POA: Diagnosis not present

## 2020-07-14 DIAGNOSIS — G610 Guillain-Barre syndrome: Secondary | ICD-10-CM | POA: Diagnosis not present

## 2020-07-14 NOTE — Progress Notes (Signed)
Pulmonary Critical Care Medicine [facility] [place]   PULMONARY CRITICAL CARE SERVICE  PROGRESS NOTE  Date of Service: 07/14/2020  Brian Espinoza  [id number]  DOB: 1956/01/07   DOA: 06/05/2020  Referring Physician: Carron Curie, MD  HPI: Brian Espinoza is a 65 y.o. male seen for follow up of Acute on Chronic Respiratory Failure.  Currently is on T collar has been on goal of 16 hours  Medications: Reviewed on Rounds  Physical Exam:  Vitals: Temperature is 96.7 pulse 95 respiratory rate is 14 blood pressure is 124/81 saturations 97%  Ventilator Settings on T collar goal of 16-hour  . General: Comfortable at this time . Eyes: Grossly normal lids, irises & conjunctiva . ENT: grossly tongue is normal . Neck: no obvious mass . Cardiovascular: S1 S2 normal no gallop . Respiratory: No rhonchi coarse breath sounds . Abdomen: soft . Skin: no rash seen on limited exam . Musculoskeletal: not rigid . Psychiatric:unable to assess . Neurologic: no seizure no involuntary movements         Lab Data:   Basic Metabolic Panel: Recent Labs  Lab 07/10/20 0403  NA 138  K 4.1  CL 101  CO2 26  GLUCOSE 230*  BUN 20  CREATININE 0.39*  CALCIUM 8.9    ABG: No results for input(s): PHART, PCO2ART, PO2ART, HCO3, O2SAT in the last 168 hours.  Liver Function Tests: No results for input(s): AST, ALT, ALKPHOS, BILITOT, PROT, ALBUMIN in the last 168 hours. No results for input(s): LIPASE, AMYLASE in the last 168 hours. No results for input(s): AMMONIA in the last 168 hours.  CBC: Recent Labs  Lab 07/10/20 0403  WBC 10.3  HGB 13.0  HCT 40.1  MCV 89.3  PLT 112*    Cardiac Enzymes: No results for input(s): CKTOTAL, CKMB, CKMBINDEX, TROPONINI in the last 168 hours.  BNP (last 3 results) No results for input(s): BNP in the last 8760 hours.  ProBNP (last 3 results) No results for input(s): PROBNP in the last 8760 hours.  Radiological Exams: No results  found.  Assessment/Plan Active Problems:   Acute on chronic respiratory failure with hypoxia (HCC)   Acute inflammatory demyelinating polyneuropathy (HCC)   Dysautonomia (HCC)   Atrial fibrillation with RVR (HCC)   Healthcare associated bacterial pneumonia   1. Acute on chronic respiratory failure hypoxia we will continue with T-piece titrate oxygen continue pulmonary toilet 2. Demyelinating polyneuropathy slow to improve need ongoing physical therapy 3. Dysautonomia no change 4. Atrial fibrillation rate controlled 5. Healthcare associated pneumonia treated slowly improving   I have personally seen and evaluated the patient, evaluated laboratory and imaging results, formulated the assessment and plan and placed orders. The Patient requires high complexity decision making with multiple systems involvement.  Rounds were done with the Respiratory Therapy Director and Staff therapists and discussed with nursing staff also.  Yevonne Pax, MD [facility] Pulmonary Critical Care Medicine Sleep Medicine

## 2020-07-15 DIAGNOSIS — I4891 Unspecified atrial fibrillation: Secondary | ICD-10-CM | POA: Diagnosis not present

## 2020-07-15 DIAGNOSIS — G901 Familial dysautonomia [Riley-Day]: Secondary | ICD-10-CM | POA: Diagnosis not present

## 2020-07-15 DIAGNOSIS — G610 Guillain-Barre syndrome: Secondary | ICD-10-CM | POA: Diagnosis not present

## 2020-07-15 DIAGNOSIS — J9621 Acute and chronic respiratory failure with hypoxia: Secondary | ICD-10-CM | POA: Diagnosis not present

## 2020-07-15 NOTE — Progress Notes (Signed)
Pulmonary Critical Care Medicine [facility] [place]   PULMONARY CRITICAL CARE SERVICE  PROGRESS NOTE  Date of Service: 07/15/2020  Brian Espinoza  XBJ:[id number]  DOB: April 30, 1956   DOA: 06/05/2020  Referring Physician: Carron Curie, MD  HPI: Brian Espinoza is a 65 y.o. male seen for follow up of Acute on Chronic Respiratory Failure.  Patient is currently on pressure assist control has been on 35% FiO2  Medications: Reviewed on Rounds  Physical Exam:  Vitals: Temperature is 97.0 pulse 84 respiratory 14 blood pressure is 117/70 saturations 97%  Ventilator Settings on pressure assist control FiO2 is 35% PEEP 5 IP 14  . General: Comfortable at this time . Eyes: Grossly normal lids, irises & conjunctiva . ENT: grossly tongue is normal . Neck: no obvious mass . Cardiovascular: S1 S2 normal no gallop . Respiratory: Scattered rhonchi expansion equal . Abdomen: soft . Skin: no rash seen on limited exam . Musculoskeletal: not rigid . Psychiatric:unable to assess . Neurologic: no seizure no involuntary movements         Lab Data:   Basic Metabolic Panel: Recent Labs  Lab 07/10/20 0403  NA 138  K 4.1  CL 101  CO2 26  GLUCOSE 230*  BUN 20  CREATININE 0.39*  CALCIUM 8.9    ABG: No results for input(s): PHART, PCO2ART, PO2ART, HCO3, O2SAT in the last 168 hours.  Liver Function Tests: No results for input(s): AST, ALT, ALKPHOS, BILITOT, PROT, ALBUMIN in the last 168 hours. No results for input(s): LIPASE, AMYLASE in the last 168 hours. No results for input(s): AMMONIA in the last 168 hours.  CBC: Recent Labs  Lab 07/10/20 0403  WBC 10.3  HGB 13.0  HCT 40.1  MCV 89.3  PLT 112*    Cardiac Enzymes: No results for input(s): CKTOTAL, CKMB, CKMBINDEX, TROPONINI in the last 168 hours.  BNP (last 3 results) No results for input(s): BNP in the last 8760 hours.  ProBNP (last 3 results) No results for input(s): PROBNP in the last 8760 hours.  Radiological  Exams: No results found.  Assessment/Plan Active Problems:   Acute on chronic respiratory failure with hypoxia (HCC)   Acute inflammatory demyelinating polyneuropathy (HCC)   Dysautonomia (HCC)   Atrial fibrillation with RVR (HCC)   Healthcare associated bacterial pneumonia   1. Acute on chronic respiratory failure hypoxia we will continue with pressure assist control on 35% FiO2. 2. Acute inflammatory demyelinating polyneuropathy no change we will continue to follow 3. Dysautonomia patient is at baseline 4. Atrial fibrillation rate is controlled 5. Healthcare associated pneumonia treated slowly improving   I have personally seen and evaluated the patient, evaluated laboratory and imaging results, formulated the assessment and plan and placed orders. The Patient requires high complexity decision making with multiple systems involvement.  Rounds were done with the Respiratory Therapy Director and Staff therapists and discussed with nursing staff also.  Yevonne Pax, MD [facility] Pulmonary Critical Care Medicine Sleep Medicine

## 2020-07-16 ENCOUNTER — Other Ambulatory Visit (HOSPITAL_COMMUNITY): Payer: Medicare Other

## 2020-07-16 DIAGNOSIS — G901 Familial dysautonomia [Riley-Day]: Secondary | ICD-10-CM | POA: Diagnosis not present

## 2020-07-16 DIAGNOSIS — J9621 Acute and chronic respiratory failure with hypoxia: Secondary | ICD-10-CM | POA: Diagnosis not present

## 2020-07-16 DIAGNOSIS — I4891 Unspecified atrial fibrillation: Secondary | ICD-10-CM | POA: Diagnosis not present

## 2020-07-16 DIAGNOSIS — G610 Guillain-Barre syndrome: Secondary | ICD-10-CM | POA: Diagnosis not present

## 2020-07-16 LAB — BASIC METABOLIC PANEL
Anion gap: 12 (ref 5–15)
BUN: 24 mg/dL — ABNORMAL HIGH (ref 8–23)
CO2: 29 mmol/L (ref 22–32)
Calcium: 9 mg/dL (ref 8.9–10.3)
Chloride: 96 mmol/L — ABNORMAL LOW (ref 98–111)
Creatinine, Ser: 0.39 mg/dL — ABNORMAL LOW (ref 0.61–1.24)
GFR, Estimated: >60 mL/min (ref >60)
Glucose, Bld: 233 mg/dL — ABNORMAL HIGH (ref 70–99)
Potassium: 4.5 mmol/L (ref 3.5–5.1)
Sodium: 137 mmol/L (ref 135–145)

## 2020-07-16 LAB — CBC
HCT: 42.5 % (ref 39.0–52.0)
Hemoglobin: 13.6 g/dL (ref 13.0–17.0)
MCH: 29 pg (ref 26.0–34.0)
MCHC: 32 g/dL (ref 30.0–36.0)
MCV: 90.6 fL (ref 80.0–100.0)
Platelets: 119 10*3/uL — ABNORMAL LOW (ref 150–400)
RBC: 4.69 MIL/uL (ref 4.22–5.81)
RDW: 17.2 % — ABNORMAL HIGH (ref 11.5–15.5)
WBC: 10.6 10*3/uL — ABNORMAL HIGH (ref 4.0–10.5)
nRBC: 0 % (ref 0.0–0.2)

## 2020-07-16 NOTE — Progress Notes (Signed)
Pulmonary Critical Care Medicine [facility] [place]   PULMONARY CRITICAL CARE SERVICE  PROGRESS NOTE  Date of Service: 07/16/2020  Brian Espinoza  PNT:[id number]  DOB: 04-Jan-1956   DOA: 06/05/2020  Referring Physician: Carron Curie, MD  HPI: Brian Espinoza is a 65 y.o. male seen for follow up of Acute on Chronic Respiratory Failure.  Patient remains on T collar currently on 35% FiO2 with good saturations.  Medications: Reviewed on Rounds  Physical Exam:  Vitals: Temperature 97.0 pulse 100 respiratory 21 blood pressure is 122/78 saturations 100%  Ventilator Settings on T collar FiO2 28% titrate for 20 hours  . General: Comfortable at this time . Eyes: Grossly normal lids, irises & conjunctiva . ENT: grossly tongue is normal . Neck: no obvious mass . Cardiovascular: S1 S2 normal no gallop . Respiratory: Scattered rhonchi very coarse breath sound . Abdomen: soft . Skin: no rash seen on limited exam . Musculoskeletal: not rigid . Psychiatric:unable to assess . Neurologic: no seizure no involuntary movements         Lab Data:   Basic Metabolic Panel: Recent Labs  Lab 07/10/20 0403 07/16/20 0624  NA 138 137  K 4.1 4.5  CL 101 96*  CO2 26 29  GLUCOSE 230* 233*  BUN 20 24*  CREATININE 0.39* 0.39*  CALCIUM 8.9 9.0    ABG: No results for input(s): PHART, PCO2ART, PO2ART, HCO3, O2SAT in the last 168 hours.  Liver Function Tests: No results for input(s): AST, ALT, ALKPHOS, BILITOT, PROT, ALBUMIN in the last 168 hours. No results for input(s): LIPASE, AMYLASE in the last 168 hours. No results for input(s): AMMONIA in the last 168 hours.  CBC: Recent Labs  Lab 07/10/20 0403 07/16/20 0624  WBC 10.3 10.6*  HGB 13.0 13.6  HCT 40.1 42.5  MCV 89.3 90.6  PLT 112* 119*    Cardiac Enzymes: No results for input(s): CKTOTAL, CKMB, CKMBINDEX, TROPONINI in the last 168 hours.  BNP (last 3 results) No results for input(s): BNP in the last 8760 hours.  ProBNP  (last 3 results) No results for input(s): PROBNP in the last 8760 hours.  Radiological Exams: DG CHEST PORT 1 VIEW  Result Date: 07/16/2020 CLINICAL DATA:  Respirator dependent EXAM: PORTABLE CHEST 1 VIEW COMPARISON:  Six days ago FINDINGS: Hazy opacity at the bases with diaphragm obscuration. Stable heart size and mediastinal contours. Tracheostomy tube remains in place. Extensive artifact from EKG leads. IMPRESSION: Presumed atelectasis at the bases. No significant change compared to 6 days prior. Electronically Signed   By: Marnee Spring M.D.   On: 07/16/2020 07:58    Assessment/Plan Active Problems:   Acute on chronic respiratory failure with hypoxia (HCC)   Acute inflammatory demyelinating polyneuropathy (HCC)   Dysautonomia (HCC)   Atrial fibrillation with RVR (HCC)   Healthcare associated bacterial pneumonia   1. Acute on chronic respiratory failure with hypoxia we will continue with T collar trials currently on 35% FiO2 2. Acute inflammatory demyelinating polyneuropathy we will continue with supportive care 3. Dysautonomia no changes 4. Atrial fibrillation rate is controlled 5. Healthcare associated pneumonia treated   I have personally seen and evaluated the patient, evaluated laboratory and imaging results, formulated the assessment and plan and placed orders. The Patient requires high complexity decision making with multiple systems involvement.  Rounds were done with the Respiratory Therapy Director and Staff therapists and discussed with nursing staff also.  Yevonne Pax, MD [facility] Pulmonary Critical Care Medicine Sleep Medicine

## 2020-07-17 DIAGNOSIS — G610 Guillain-Barre syndrome: Secondary | ICD-10-CM | POA: Diagnosis not present

## 2020-07-17 DIAGNOSIS — J9621 Acute and chronic respiratory failure with hypoxia: Secondary | ICD-10-CM | POA: Diagnosis not present

## 2020-07-17 DIAGNOSIS — G901 Familial dysautonomia [Riley-Day]: Secondary | ICD-10-CM | POA: Diagnosis not present

## 2020-07-17 DIAGNOSIS — I4891 Unspecified atrial fibrillation: Secondary | ICD-10-CM | POA: Diagnosis not present

## 2020-07-17 LAB — BLOOD GAS, ARTERIAL
Acid-Base Excess: 6.9 mmol/L — ABNORMAL HIGH (ref 0.0–2.0)
Bicarbonate: 31.2 mmol/L — ABNORMAL HIGH (ref 20.0–28.0)
FIO2: 35
O2 Saturation: 98.8 %
Patient temperature: 36.8
pCO2 arterial: 46.7 mmHg (ref 32.0–48.0)
pH, Arterial: 7.439 (ref 7.350–7.450)
pO2, Arterial: 121 mmHg — ABNORMAL HIGH (ref 83.0–108.0)

## 2020-07-17 NOTE — Progress Notes (Signed)
Pulmonary Critical Care Medicine [facility] [place]   PULMONARY CRITICAL CARE SERVICE  PROGRESS NOTE  Date of Service: 07/17/2020  Lonzo Saulter  TIR:[id number]  DOB: 1955/10/10   DOA: 06/05/2020  Referring Physician: Carron Curie, MD  HPI: Goldie Dimmer is a 65 y.o. male seen for follow up of Acute on Chronic Respiratory Failure.  Patient currently is off the ventilator on T collar completed 24 hours going for 48 hours  Medications: Reviewed on Rounds  Physical Exam:  Vitals: Temperature 96.4 pulse 94 respiratory 18 blood pressure is 109/78 saturations 100%  Ventilator Settings on T collar FiO2 35%  . General: Comfortable at this time . Eyes: Grossly normal lids, irises & conjunctiva . ENT: grossly tongue is normal . Neck: no obvious mass . Cardiovascular: S1 S2 normal no gallop . Respiratory: Scattered coarse rhonchi noted bilaterally . Abdomen: soft . Skin: no rash seen on limited exam . Musculoskeletal: not rigid . Psychiatric:unable to assess . Neurologic: no seizure no involuntary movements         Lab Data:   Basic Metabolic Panel: Recent Labs  Lab 07/16/20 0624  NA 137  K 4.5  CL 96*  CO2 29  GLUCOSE 233*  BUN 24*  CREATININE 0.39*  CALCIUM 9.0    ABG: Recent Labs  Lab 07/17/20 1000  PHART 7.439  PCO2ART 46.7  PO2ART 121*  HCO3 31.2*  O2SAT 98.8    Liver Function Tests: No results for input(s): AST, ALT, ALKPHOS, BILITOT, PROT, ALBUMIN in the last 168 hours. No results for input(s): LIPASE, AMYLASE in the last 168 hours. No results for input(s): AMMONIA in the last 168 hours.  CBC: Recent Labs  Lab 07/16/20 0624  WBC 10.6*  HGB 13.6  HCT 42.5  MCV 90.6  PLT 119*    Cardiac Enzymes: No results for input(s): CKTOTAL, CKMB, CKMBINDEX, TROPONINI in the last 168 hours.  BNP (last 3 results) No results for input(s): BNP in the last 8760 hours.  ProBNP (last 3 results) No results for input(s): PROBNP in the last 8760  hours.  Radiological Exams: DG CHEST PORT 1 VIEW  Result Date: 07/16/2020 CLINICAL DATA:  Respirator dependent EXAM: PORTABLE CHEST 1 VIEW COMPARISON:  Six days ago FINDINGS: Hazy opacity at the bases with diaphragm obscuration. Stable heart size and mediastinal contours. Tracheostomy tube remains in place. Extensive artifact from EKG leads. IMPRESSION: Presumed atelectasis at the bases. No significant change compared to 6 days prior. Electronically Signed   By: Marnee Spring M.D.   On: 07/16/2020 07:58    Assessment/Plan Active Problems:   Acute on chronic respiratory failure with hypoxia (HCC)   Acute inflammatory demyelinating polyneuropathy (HCC)   Dysautonomia (HCC)   Atrial fibrillation with RVR (HCC)   Healthcare associated bacterial pneumonia   1. Acute on chronic respiratory failure with hypoxia plan is to continue with the weaning for a goal of 48 hours 2. Acute inflammatory demyelinating polyneuropathy supportive care very slow to improve 3. Dysautonomia no changes 4. Atrial fibrillation rate is controlled 5. Healthcare associated pneumonia treated we will continue to monitor   I have personally seen and evaluated the patient, evaluated laboratory and imaging results, formulated the assessment and plan and placed orders. The Patient requires high complexity decision making with multiple systems involvement.  Rounds were done with the Respiratory Therapy Director and Staff therapists and discussed with nursing staff also.  Yevonne Pax, MD [facility] Pulmonary Critical Care Medicine Sleep Medicine

## 2020-07-18 DIAGNOSIS — J9621 Acute and chronic respiratory failure with hypoxia: Secondary | ICD-10-CM | POA: Diagnosis not present

## 2020-07-18 DIAGNOSIS — G901 Familial dysautonomia [Riley-Day]: Secondary | ICD-10-CM | POA: Diagnosis not present

## 2020-07-18 DIAGNOSIS — I4891 Unspecified atrial fibrillation: Secondary | ICD-10-CM | POA: Diagnosis not present

## 2020-07-18 DIAGNOSIS — G610 Guillain-Barre syndrome: Secondary | ICD-10-CM | POA: Diagnosis not present

## 2020-07-18 LAB — BLOOD GAS, ARTERIAL
Acid-Base Excess: 8 mmol/L — ABNORMAL HIGH (ref 0.0–2.0)
Acid-Base Excess: 7.7 mmol/L — ABNORMAL HIGH (ref 0.0–2.0)
Bicarbonate: 33 mmol/L — ABNORMAL HIGH (ref 20.0–28.0)
Bicarbonate: 33 mmol/L — ABNORMAL HIGH (ref 20.0–28.0)
FIO2: 35
FIO2: 60
O2 Saturation: 85.6 %
O2 Saturation: 93.7 %
Patient temperature: 36.6
Patient temperature: 36.8
pCO2 arterial: 54 mmHg — ABNORMAL HIGH (ref 32.0–48.0)
pCO2 arterial: 58.9 mmHg — ABNORMAL HIGH (ref 32.0–48.0)
pH, Arterial: 7.401 (ref 7.350–7.450)
pH, Arterial: 7.366 (ref 7.350–7.450)
pO2, Arterial: 50.8 mmHg — ABNORMAL LOW (ref 83.0–108.0)
pO2, Arterial: 72.3 mmHg — ABNORMAL LOW (ref 83.0–108.0)

## 2020-07-18 NOTE — Progress Notes (Signed)
Pulmonary Critical Care Medicine [facility] [place]   PULMONARY CRITICAL CARE SERVICE  PROGRESS NOTE  Date of Service: 07/18/2020  Brian Espinoza  ZOX:[id number]  DOB: Jan 30, 1956   DOA: 06/05/2020  Referring Physician: Carron Curie, MD  HPI: Brian Espinoza is a 65 y.o. male seen for follow up of Acute on Chronic Respiratory Failure.  Patient is on T collar on 35% FiO2 with good saturations.  Medications: Reviewed on Rounds  Physical Exam:  Vitals: Temperature is 97.2 pulse 98 respiratory rate is 20 blood pressure is 104/85 saturations 97%  Ventilator Settings off the ventilator on T collar  . General: Comfortable at this time . Eyes: Grossly normal lids, irises & conjunctiva . ENT: grossly tongue is normal . Neck: no obvious mass . Cardiovascular: S1 S2 normal no gallop . Respiratory: No rhonchi no rales noted at this time . Abdomen: soft . Skin: no rash seen on limited exam . Musculoskeletal: not rigid . Psychiatric:unable to assess . Neurologic: no seizure no involuntary movements         Lab Data:   Basic Metabolic Panel: Recent Labs  Lab 07/16/20 0624  NA 137  K 4.5  CL 96*  CO2 29  GLUCOSE 233*  BUN 24*  CREATININE 0.39*  CALCIUM 9.0    ABG: Recent Labs  Lab 07/17/20 1000  PHART 7.439  PCO2ART 46.7  PO2ART 121*  HCO3 31.2*  O2SAT 98.8    Liver Function Tests: No results for input(s): AST, ALT, ALKPHOS, BILITOT, PROT, ALBUMIN in the last 168 hours. No results for input(s): LIPASE, AMYLASE in the last 168 hours. No results for input(s): AMMONIA in the last 168 hours.  CBC: Recent Labs  Lab 07/16/20 0624  WBC 10.6*  HGB 13.6  HCT 42.5  MCV 90.6  PLT 119*    Cardiac Enzymes: No results for input(s): CKTOTAL, CKMB, CKMBINDEX, TROPONINI in the last 168 hours.  BNP (last 3 results) No results for input(s): BNP in the last 8760 hours.  ProBNP (last 3 results) No results for input(s): PROBNP in the last 8760 hours.  Radiological  Exams: No results found.  Assessment/Plan Active Problems:   Acute on chronic respiratory failure with hypoxia (HCC)   Acute inflammatory demyelinating polyneuropathy (HCC)   Dysautonomia (HCC)   Atrial fibrillation with RVR (HCC)   Healthcare associated bacterial pneumonia   1. Acute on chronic respiratory failure hypoxia we will continue with T-piece titrate oxygen continue pulmonary toilet 2. Acute inflammatory demyelinating polyneuropathy no change we will continue with supportive care 3. Dysautonomia no change continue present management 4. Atrial fibrillation rate is controlled 5. Healthcare associated pneumonia treated   I have personally seen and evaluated the patient, evaluated laboratory and imaging results, formulated the assessment and plan and placed orders. The Patient requires high complexity decision making with multiple systems involvement.  Rounds were done with the Respiratory Therapy Director and Staff therapists and discussed with nursing staff also.  Yevonne Pax, MD [facility] Pulmonary Critical Care Medicine Sleep Medicine

## 2020-07-19 DIAGNOSIS — G610 Guillain-Barre syndrome: Secondary | ICD-10-CM | POA: Diagnosis not present

## 2020-07-19 DIAGNOSIS — J9621 Acute and chronic respiratory failure with hypoxia: Secondary | ICD-10-CM | POA: Diagnosis not present

## 2020-07-19 DIAGNOSIS — G901 Familial dysautonomia [Riley-Day]: Secondary | ICD-10-CM | POA: Diagnosis not present

## 2020-07-19 DIAGNOSIS — I4891 Unspecified atrial fibrillation: Secondary | ICD-10-CM | POA: Diagnosis not present

## 2020-07-19 LAB — COMPREHENSIVE METABOLIC PANEL
ALT: 82 U/L — ABNORMAL HIGH (ref 0–44)
AST: 31 U/L (ref 15–41)
Albumin: 2.6 g/dL — ABNORMAL LOW (ref 3.5–5.0)
Alkaline Phosphatase: 51 U/L (ref 38–126)
Anion gap: 12 (ref 5–15)
BUN: 21 mg/dL (ref 8–23)
CO2: 31 mmol/L (ref 22–32)
Calcium: 9.1 mg/dL (ref 8.9–10.3)
Chloride: 97 mmol/L — ABNORMAL LOW (ref 98–111)
Creatinine, Ser: <0.3 mg/dL — ABNORMAL LOW (ref 0.61–1.24)
Glucose, Bld: 155 mg/dL — ABNORMAL HIGH (ref 70–99)
Potassium: 4.3 mmol/L (ref 3.5–5.1)
Sodium: 140 mmol/L (ref 135–145)
Total Bilirubin: 0.6 mg/dL (ref 0.3–1.2)
Total Protein: 4.8 g/dL — ABNORMAL LOW (ref 6.5–8.1)

## 2020-07-19 LAB — BLOOD GAS, ARTERIAL
Acid-Base Excess: 8.8 mmol/L — ABNORMAL HIGH (ref 0.0–2.0)
Bicarbonate: 33.9 mmol/L — ABNORMAL HIGH (ref 20.0–28.0)
FIO2: 60
O2 Saturation: 99.3 %
Patient temperature: 35.4
pCO2 arterial: 53.1 mmHg — ABNORMAL HIGH (ref 32.0–48.0)
pH, Arterial: 7.413 (ref 7.350–7.450)
pO2, Arterial: 163 mmHg — ABNORMAL HIGH (ref 83.0–108.0)

## 2020-07-19 LAB — CBC
HCT: 42.2 % (ref 39.0–52.0)
Hemoglobin: 14.4 g/dL (ref 13.0–17.0)
MCH: 29.9 pg (ref 26.0–34.0)
MCHC: 34.1 g/dL (ref 30.0–36.0)
MCV: 87.7 fL (ref 80.0–100.0)
Platelets: 140 10*3/uL — ABNORMAL LOW (ref 150–400)
RBC: 4.81 MIL/uL (ref 4.22–5.81)
RDW: 17.7 % — ABNORMAL HIGH (ref 11.5–15.5)
WBC: 10.9 10*3/uL — ABNORMAL HIGH (ref 4.0–10.5)
nRBC: 0 % (ref 0.0–0.2)

## 2020-07-19 NOTE — Progress Notes (Signed)
Pulmonary Critical Care Medicine [facility] [place]   PULMONARY CRITICAL CARE SERVICE  PROGRESS NOTE  Date of Service: 07/19/2020  Brian Espinoza  VXB:[id number]  DOB: 1956-03-25   DOA: 06/05/2020  Referring Physician: Carron Curie, MD  HPI: Brian Espinoza is a 65 y.o. male seen for follow up of Acute on Chronic Respiratory Failure.  Patient was placed back on the ventilator now is on assist control had some increase in the PCO2 noted overnight  Medications: Reviewed on Rounds  Physical Exam:  Vitals: Temperature 97.8 pulse 105 respiratory 22 blood pressure is 91/36 saturations 97%  Ventilator Settings on assist control FiO2 60% tidal volume 500 PEEP 5  . General: Comfortable at this time . Eyes: Grossly normal lids, irises & conjunctiva . ENT: grossly tongue is normal . Neck: no obvious mass . Cardiovascular: S1 S2 normal no gallop . Respiratory: Scattered rhonchi expansion is equal . Abdomen: soft . Skin: no rash seen on limited exam . Musculoskeletal: not rigid . Psychiatric:unable to assess . Neurologic: no seizure no involuntary movements         Lab Data:   Basic Metabolic Panel: Recent Labs  Lab 07/16/20 0624 07/19/20 0511  NA 137 140  K 4.5 4.3  CL 96* 97*  CO2 29 31  GLUCOSE 233* 155*  BUN 24* 21  CREATININE 0.39* <0.30*  CALCIUM 9.0 9.1    ABG: Recent Labs  Lab 07/17/20 1000 07/18/20 1235 07/18/20 1500  PHART 7.439 7.401 7.366  PCO2ART 46.7 54.0* 58.9*  PO2ART 121* 50.8* 72.3*  HCO3 31.2* 33.0* 33.0*  O2SAT 98.8 85.6 93.7    Liver Function Tests: Recent Labs  Lab 07/19/20 0511  AST 31  ALT 82*  ALKPHOS 51  BILITOT 0.6  PROT 4.8*  ALBUMIN 2.6*   No results for input(s): LIPASE, AMYLASE in the last 168 hours. No results for input(s): AMMONIA in the last 168 hours.  CBC: Recent Labs  Lab 07/16/20 0624 07/19/20 0755  WBC 10.6* 10.9*  HGB 13.6 14.4  HCT 42.5 42.2  MCV 90.6 87.7  PLT 119* 140*    Cardiac Enzymes: No  results for input(s): CKTOTAL, CKMB, CKMBINDEX, TROPONINI in the last 168 hours.  BNP (last 3 results) No results for input(s): BNP in the last 8760 hours.  ProBNP (last 3 results) No results for input(s): PROBNP in the last 8760 hours.  Radiological Exams: No results found.  Assessment/Plan Active Problems:   Acute on chronic respiratory failure with hypoxia (HCC)   Acute inflammatory demyelinating polyneuropathy (HCC)   Dysautonomia (HCC)   Atrial fibrillation with RVR (HCC)   Healthcare associated bacterial pneumonia   1. Acute on chronic respiratory failure hypoxia we will continue with full support on assist control titrate oxygen as tolerated. 2. Acute inflammatory demyelinating polyneuropathy no change we will continue with supportive care 3. Dysautonomia patient is at baseline 4. Atrial fibrillation rate controlled 5. Healthcare associated pneumonia treated slowly improving   I have personally seen and evaluated the patient, evaluated laboratory and imaging results, formulated the assessment and plan and placed orders. The Patient requires high complexity decision making with multiple systems involvement.  Rounds were done with the Respiratory Therapy Director and Staff therapists and discussed with nursing staff also.  Yevonne Pax, MD [facility] Pulmonary Critical Care Medicine Sleep Medicine

## 2020-07-20 DIAGNOSIS — G610 Guillain-Barre syndrome: Secondary | ICD-10-CM | POA: Diagnosis not present

## 2020-07-20 DIAGNOSIS — J9621 Acute and chronic respiratory failure with hypoxia: Secondary | ICD-10-CM | POA: Diagnosis not present

## 2020-07-20 DIAGNOSIS — I4891 Unspecified atrial fibrillation: Secondary | ICD-10-CM | POA: Diagnosis not present

## 2020-07-20 DIAGNOSIS — G901 Familial dysautonomia [Riley-Day]: Secondary | ICD-10-CM | POA: Diagnosis not present

## 2020-07-20 NOTE — Progress Notes (Signed)
Pulmonary Critical Care Medicine [facility] [place]   PULMONARY CRITICAL CARE SERVICE  PROGRESS NOTE  Date of Service: 07/20/2020  Brian Espinoza  TTS:[id number]  DOB: [birth year]   DOA: 06/05/2020  Referring Physician: Carron Curie, MD  HPI: Brian Espinoza is a 65 y.o. male seen for follow up of Acute on Chronic Respiratory Failure.  Patient is on assist control mode on 60% FiO2 PEEP of 5 tidal volume 400  Medications: Reviewed on Rounds  Physical Exam:  Vitals: Temperature is 97.0 pulse 90 respiratory rate is 20 blood pressure is 94/60 saturations 100%  Ventilator Settings on assist control FiO2 60% tidal volume 400 PEEP 5  . General: Comfortable at this time . Eyes: Grossly normal lids, irises & conjunctiva . ENT: grossly tongue is normal . Neck: no obvious mass . Cardiovascular: S1 S2 normal no gallop . Respiratory: Scattered rhonchi coarse breath sound . Abdomen: soft . Skin: no rash seen on limited exam . Musculoskeletal: not rigid . Psychiatric:unable to assess . Neurologic: no seizure no involuntary movements         Lab Data:   Basic Metabolic Panel: Recent Labs  Lab 07/16/20 0624 07/19/20 0511  NA 137 140  K 4.5 4.3  CL 96* 97*  CO2 29 31  GLUCOSE 233* 155*  BUN 24* 21  CREATININE 0.39* <0.30*  CALCIUM 9.0 9.1    ABG: Recent Labs  Lab 07/17/20 1000 07/18/20 1235 07/18/20 1500 07/19/20 2150  PHART 7.439 7.401 7.366 7.413  PCO2ART 46.7 54.0* 58.9* 53.1*  PO2ART 121* 50.8* 72.3* 163*  HCO3 31.2* 33.0* 33.0* 33.9*  O2SAT 98.8 85.6 93.7 99.3    Liver Function Tests: Recent Labs  Lab 07/19/20 0511  AST 31  ALT 82*  ALKPHOS 51  BILITOT 0.6  PROT 4.8*  ALBUMIN 2.6*   No results for input(s): LIPASE, AMYLASE in the last 168 hours. No results for input(s): AMMONIA in the last 168 hours.  CBC: Recent Labs  Lab 07/16/20 0624 07/19/20 0755  WBC 10.6* 10.9*  HGB 13.6 14.4  HCT 42.5 42.2  MCV 90.6 87.7  PLT 119* 140*    Cardiac  Enzymes: No results for input(s): CKTOTAL, CKMB, CKMBINDEX, TROPONINI in the last 168 hours.  BNP (last 3 results) No results for input(s): BNP in the last 8760 hours.  ProBNP (last 3 results) No results for input(s): PROBNP in the last 8760 hours.  Radiological Exams: No results found.  Assessment/Plan Active Problems:   Acute on chronic respiratory failure with hypoxia (HCC)   Acute inflammatory demyelinating polyneuropathy (HCC)   Dysautonomia (HCC)   Atrial fibrillation with RVR (HCC)   Healthcare associated bacterial pneumonia   1. Acute on chronic respiratory failure hypoxia patient right now is not in any distress we should be able to advance the weaning. 2. Acute inflammatory demyelinating polyneuropathy no change 3. Dysautonomia patient is at baseline 4. Atrial fibrillation with RVR rate is controlled we will continue to monitor 5. Healthcare associated pneumonia treated   I have personally seen and evaluated the patient, evaluated laboratory and imaging results, formulated the assessment and plan and placed orders. The Patient requires high complexity decision making with multiple systems involvement.  Rounds were done with the Respiratory Therapy Director and Staff therapists and discussed with nursing staff also.  Yevonne Pax, MD [facility] Pulmonary Critical Care Medicine Sleep Medicine

## 2020-07-21 DIAGNOSIS — I4891 Unspecified atrial fibrillation: Secondary | ICD-10-CM | POA: Diagnosis not present

## 2020-07-21 DIAGNOSIS — G610 Guillain-Barre syndrome: Secondary | ICD-10-CM | POA: Diagnosis not present

## 2020-07-21 DIAGNOSIS — G901 Familial dysautonomia [Riley-Day]: Secondary | ICD-10-CM | POA: Diagnosis not present

## 2020-07-21 DIAGNOSIS — J9621 Acute and chronic respiratory failure with hypoxia: Secondary | ICD-10-CM | POA: Diagnosis not present

## 2020-07-21 NOTE — Progress Notes (Signed)
Pulmonary Critical Care Medicine [facility] [place]   PULMONARY CRITICAL CARE SERVICE  PROGRESS NOTE  Date of Service: 07/21/2020  Amelio Brosky  QQP:[id number]  DOB: 1955-07-01   DOA: 06/05/2020  Referring Physician: Carron Curie, MD  HPI: Hulan Szumski is a 65 y.o. male seen for follow up of Acute on Chronic Respiratory Failure.  Patient is on full support right now on assist control mode supposed to wean on pressure support  Medications: Reviewed on Rounds  Physical Exam:  Vitals: Temperature 97.2 pulse 102 respiratory 23 blood pressure is 103/70 saturations 93%  Ventilator Settings on assist control FiO2 45% tidal volume 400 PEEP 5  . General: Comfortable at this time . Eyes: Grossly normal lids, irises & conjunctiva . ENT: grossly tongue is normal . Neck: no obvious mass . Cardiovascular: S1 S2 normal no gallop . Respiratory: No rhonchi no rales scattered . Abdomen: soft . Skin: no rash seen on limited exam . Musculoskeletal: not rigid . Psychiatric:unable to assess . Neurologic: no seizure no involuntary movements         Lab Data:   Basic Metabolic Panel: Recent Labs  Lab 07/16/20 0624 07/19/20 0511  NA 137 140  K 4.5 4.3  CL 96* 97*  CO2 29 31  GLUCOSE 233* 155*  BUN 24* 21  CREATININE 0.39* <0.30*  CALCIUM 9.0 9.1    ABG: Recent Labs  Lab 07/17/20 1000 07/18/20 1235 07/18/20 1500 07/19/20 2150  PHART 7.439 7.401 7.366 7.413  PCO2ART 46.7 54.0* 58.9* 53.1*  PO2ART 121* 50.8* 72.3* 163*  HCO3 31.2* 33.0* 33.0* 33.9*  O2SAT 98.8 85.6 93.7 99.3    Liver Function Tests: Recent Labs  Lab 07/19/20 0511  AST 31  ALT 82*  ALKPHOS 51  BILITOT 0.6  PROT 4.8*  ALBUMIN 2.6*   No results for input(s): LIPASE, AMYLASE in the last 168 hours. No results for input(s): AMMONIA in the last 168 hours.  CBC: Recent Labs  Lab 07/16/20 0624 07/19/20 0755  WBC 10.6* 10.9*  HGB 13.6 14.4  HCT 42.5 42.2  MCV 90.6 87.7  PLT 119* 140*     Cardiac Enzymes: No results for input(s): CKTOTAL, CKMB, CKMBINDEX, TROPONINI in the last 168 hours.  BNP (last 3 results) No results for input(s): BNP in the last 8760 hours.  ProBNP (last 3 results) No results for input(s): PROBNP in the last 8760 hours.  Radiological Exams: No results found.  Assessment/Plan Active Problems:   Acute on chronic respiratory failure with hypoxia (HCC)   Acute inflammatory demyelinating polyneuropathy (HCC)   Dysautonomia (HCC)   Atrial fibrillation with RVR (HCC)   Healthcare associated bacterial pneumonia   1. Acute on chronic respiratory failure hypoxia we will continue with assist control titrate oxygen as tolerated 2. Acute inflammatory demyelinating polyneuropathy slow improvement 3. Dysautonomia control 4. Atrial fibrillation rate controlled 5. Healthcare associated pneumonia slow improvement   I have personally seen and evaluated the patient, evaluated laboratory and imaging results, formulated the assessment and plan and placed orders. The Patient requires high complexity decision making with multiple systems involvement.  Rounds were done with the Respiratory Therapy Director and Staff therapists and discussed with nursing staff also.  Yevonne Pax, MD [facility] Pulmonary Critical Care Medicine Sleep Medicine

## 2020-07-22 DIAGNOSIS — G610 Guillain-Barre syndrome: Secondary | ICD-10-CM | POA: Diagnosis not present

## 2020-07-22 DIAGNOSIS — I4891 Unspecified atrial fibrillation: Secondary | ICD-10-CM | POA: Diagnosis not present

## 2020-07-22 DIAGNOSIS — G901 Familial dysautonomia [Riley-Day]: Secondary | ICD-10-CM | POA: Diagnosis not present

## 2020-07-22 DIAGNOSIS — J9621 Acute and chronic respiratory failure with hypoxia: Secondary | ICD-10-CM | POA: Diagnosis not present

## 2020-07-22 LAB — BASIC METABOLIC PANEL
Anion gap: 10 (ref 5–15)
BUN: 19 mg/dL (ref 8–23)
CO2: 29 mmol/L (ref 22–32)
Calcium: 8.7 mg/dL — ABNORMAL LOW (ref 8.9–10.3)
Chloride: 97 mmol/L — ABNORMAL LOW (ref 98–111)
Creatinine, Ser: <0.3 mg/dL — ABNORMAL LOW (ref 0.61–1.24)
Glucose, Bld: 164 mg/dL — ABNORMAL HIGH (ref 70–99)
Potassium: 4.4 mmol/L (ref 3.5–5.1)
Sodium: 136 mmol/L (ref 135–145)

## 2020-07-22 LAB — CBC
HCT: 36.3 % — ABNORMAL LOW (ref 39.0–52.0)
Hemoglobin: 12.3 g/dL — ABNORMAL LOW (ref 13.0–17.0)
MCH: 30 pg (ref 26.0–34.0)
MCHC: 33.9 g/dL (ref 30.0–36.0)
MCV: 88.5 fL (ref 80.0–100.0)
Platelets: 101 10*3/uL — ABNORMAL LOW (ref 150–400)
RBC: 4.1 MIL/uL — ABNORMAL LOW (ref 4.22–5.81)
RDW: 17.5 % — ABNORMAL HIGH (ref 11.5–15.5)
WBC: 5.8 10*3/uL (ref 4.0–10.5)
nRBC: 0 % (ref 0.0–0.2)

## 2020-07-22 NOTE — Progress Notes (Signed)
Pulmonary Critical Care Medicine [facility] [place]   PULMONARY CRITICAL CARE SERVICE  PROGRESS NOTE  Date of Service: 07/22/2020  Brian Espinoza  FWY:[id number]  DOB: Jun 07, 1955   DOA: 06/05/2020  Referring Physician: Carron Curie, MD  HPI: Brian Espinoza is a 65 y.o. male seen for follow up of Acute on Chronic Respiratory Failure.  Patient on pressure support currently on 12/5 with a goal of 4 hours  Medications: Reviewed on Rounds  Physical Exam:  Vitals: Temperature is 96.4 pulse 104 respiratory rate 20 blood pressure is 130/79 saturations 100%  Ventilator Settings pressure support FiO2 is 40 pressure 12/5  . General: Comfortable at this time . Eyes: Grossly normal lids, irises & conjunctiva . ENT: grossly tongue is normal . Neck: no obvious mass . Cardiovascular: S1 S2 normal no gallop . Respiratory: Scattered rhonchi are noted . Abdomen: soft . Skin: no rash seen on limited exam . Musculoskeletal: not rigid . Psychiatric:unable to assess . Neurologic: no seizure no involuntary movements         Lab Data:   Basic Metabolic Panel: Recent Labs  Lab 07/16/20 0624 07/19/20 0511 07/22/20 0324  NA 137 140 136  K 4.5 4.3 4.4  CL 96* 97* 97*  CO2 29 31 29   GLUCOSE 233* 155* 164*  BUN 24* 21 19  CREATININE 0.39* <0.30* <0.30*  CALCIUM 9.0 9.1 8.7*    ABG: Recent Labs  Lab 07/17/20 1000 07/18/20 1235 07/18/20 1500 07/19/20 2150  PHART 7.439 7.401 7.366 7.413  PCO2ART 46.7 54.0* 58.9* 53.1*  PO2ART 121* 50.8* 72.3* 163*  HCO3 31.2* 33.0* 33.0* 33.9*  O2SAT 98.8 85.6 93.7 99.3    Liver Function Tests: Recent Labs  Lab 07/19/20 0511  AST 31  ALT 82*  ALKPHOS 51  BILITOT 0.6  PROT 4.8*  ALBUMIN 2.6*   No results for input(s): LIPASE, AMYLASE in the last 168 hours. No results for input(s): AMMONIA in the last 168 hours.  CBC: Recent Labs  Lab 07/16/20 0624 07/19/20 0755 07/22/20 0324  WBC 10.6* 10.9* 5.8  HGB 13.6 14.4 12.3*  HCT 42.5  42.2 36.3*  MCV 90.6 87.7 88.5  PLT 119* 140* 101*    Cardiac Enzymes: No results for input(s): CKTOTAL, CKMB, CKMBINDEX, TROPONINI in the last 168 hours.  BNP (last 3 results) No results for input(s): BNP in the last 8760 hours.  ProBNP (last 3 results) No results for input(s): PROBNP in the last 8760 hours.  Radiological Exams: No results found.  Assessment/Plan Active Problems:   Acute on chronic respiratory failure with hypoxia (HCC)   Acute inflammatory demyelinating polyneuropathy (HCC)   Dysautonomia (HCC)   Atrial fibrillation with RVR (HCC)   Healthcare associated bacterial pneumonia   1. Acute on chronic respiratory failure hypoxia we will continue to wean on pressure support goal of 4 hours. 2. Acute inflammatory demyelinating polyneuropathy no change we will continue with supportive care 3. Dysautonomia no change 4. Atrial fibrillation rate controlled 5. Healthcare associated pneumonia treated slowly improving   I have personally seen and evaluated the patient, evaluated laboratory and imaging results, formulated the assessment and plan and placed orders. The Patient requires high complexity decision making with multiple systems involvement.  Rounds were done with the Respiratory Therapy Director and Staff therapists and discussed with nursing staff also.  07/24/20, MD [facility] Pulmonary Critical Care Medicine Sleep Medicine

## 2020-07-23 DIAGNOSIS — G610 Guillain-Barre syndrome: Secondary | ICD-10-CM | POA: Diagnosis not present

## 2020-07-23 DIAGNOSIS — J9621 Acute and chronic respiratory failure with hypoxia: Secondary | ICD-10-CM | POA: Diagnosis not present

## 2020-07-23 DIAGNOSIS — I4891 Unspecified atrial fibrillation: Secondary | ICD-10-CM | POA: Diagnosis not present

## 2020-07-23 DIAGNOSIS — G901 Familial dysautonomia [Riley-Day]: Secondary | ICD-10-CM | POA: Diagnosis not present

## 2020-07-23 NOTE — Progress Notes (Signed)
Pulmonary Critical Care Medicine [facility] [place]   PULMONARY CRITICAL CARE SERVICE  PROGRESS NOTE  Date of Service: 07/23/2020  Brian Espinoza  VOZ:[id number]  DOB: 09-29-1955   DOA: 06/05/2020  Referring Physician: Carron Curie, MD  HPI: Brian Espinoza is a 65 y.o. male seen for follow up of Acute on Chronic Respiratory Failure.  Patient is comfortable right now without distress on pressure support on 45% FiO2  Medications: Reviewed on Rounds  Physical Exam:  Vitals: Temperature 97.7 pulse 75 respiratory rate 16 blood pressure is 144/59 saturations 99%  Ventilator Settings on pressure support FiO2 is 45% pressure 12/5  . General: Comfortable at this time . Eyes: Grossly normal lids, irises & conjunctiva . ENT: grossly tongue is normal . Neck: no obvious mass . Cardiovascular: S1 S2 normal no gallop . Respiratory: Scattered rhonchi coarse breath sounds . Abdomen: soft . Skin: no rash seen on limited exam . Musculoskeletal: not rigid . Psychiatric:unable to assess . Neurologic: no seizure no involuntary movements         Lab Data:   Basic Metabolic Panel: Recent Labs  Lab 07/19/20 0511 07/22/20 0324  NA 140 136  K 4.3 4.4  CL 97* 97*  CO2 31 29  GLUCOSE 155* 164*  BUN 21 19  CREATININE <0.30* <0.30*  CALCIUM 9.1 8.7*    ABG: Recent Labs  Lab 07/17/20 1000 07/18/20 1235 07/18/20 1500 07/19/20 2150  PHART 7.439 7.401 7.366 7.413  PCO2ART 46.7 54.0* 58.9* 53.1*  PO2ART 121* 50.8* 72.3* 163*  HCO3 31.2* 33.0* 33.0* 33.9*  O2SAT 98.8 85.6 93.7 99.3    Liver Function Tests: Recent Labs  Lab 07/19/20 0511  AST 31  ALT 82*  ALKPHOS 51  BILITOT 0.6  PROT 4.8*  ALBUMIN 2.6*   No results for input(s): LIPASE, AMYLASE in the last 168 hours. No results for input(s): AMMONIA in the last 168 hours.  CBC: Recent Labs  Lab 07/19/20 0755 07/22/20 0324  WBC 10.9* 5.8  HGB 14.4 12.3*  HCT 42.2 36.3*  MCV 87.7 88.5  PLT 140* 101*    Cardiac  Enzymes: No results for input(s): CKTOTAL, CKMB, CKMBINDEX, TROPONINI in the last 168 hours.  BNP (last 3 results) No results for input(s): BNP in the last 8760 hours.  ProBNP (last 3 results) No results for input(s): PROBNP in the last 8760 hours.  Radiological Exams: No results found.  Assessment/Plan Active Problems:   Acute on chronic respiratory failure with hypoxia (HCC)   Acute inflammatory demyelinating polyneuropathy (HCC)   Dysautonomia (HCC)   Atrial fibrillation with RVR (HCC)   Healthcare associated bacterial pneumonia   1. Acute on chronic respiratory failure hypoxia we will continue with the pressure support weaning as tolerated. 2. Acute inflammatory demyelinating polyneuropathy no change we will continue with supportive care 3. Atrial fibrillation rate is controlled 4. Dysautonomia has been under better control 5. Healthcare associated pneumonia treated   I have personally seen and evaluated the patient, evaluated laboratory and imaging results, formulated the assessment and plan and placed orders. The Patient requires high complexity decision making with multiple systems involvement.  Rounds were done with the Respiratory Therapy Director and Staff therapists and discussed with nursing staff also.  Yevonne Pax, MD [facility] Pulmonary Critical Care Medicine Sleep Medicine

## 2020-07-24 DIAGNOSIS — G901 Familial dysautonomia [Riley-Day]: Secondary | ICD-10-CM | POA: Diagnosis not present

## 2020-07-24 DIAGNOSIS — G610 Guillain-Barre syndrome: Secondary | ICD-10-CM | POA: Diagnosis not present

## 2020-07-24 DIAGNOSIS — I4891 Unspecified atrial fibrillation: Secondary | ICD-10-CM | POA: Diagnosis not present

## 2020-07-24 DIAGNOSIS — J9621 Acute and chronic respiratory failure with hypoxia: Secondary | ICD-10-CM | POA: Diagnosis not present

## 2020-07-24 NOTE — Progress Notes (Signed)
Pulmonary Critical Care Medicine [facility] [place]   PULMONARY CRITICAL CARE SERVICE  PROGRESS NOTE  Date of Service: 07/24/2020  Brian Espinoza  [id number]  DOB: 09-19-1955   DOA: 06/05/2020  Referring Physician: Carron Curie, MD  HPI: Brian Espinoza is a 65 y.o. male seen for follow up of Acute on Chronic Respiratory Failure.  Patient is afebrile right now comfortable without distress.  Remains on pressure support  Medications: Reviewed on Rounds  Physical Exam:  Vitals: Temperature is 96.7 pulse 85 respiratory rate 19 blood pressure is 143/68 saturations 100%  Ventilator Settings on pressure support FiO2 40% pressure 12/5  . General: Comfortable at this time . Eyes: Grossly normal lids, irises & conjunctiva . ENT: grossly tongue is normal . Neck: no obvious mass . Cardiovascular: S1 S2 normal no gallop . Respiratory: No rhonchi very coarse breath sounds . Abdomen: soft . Skin: no rash seen on limited exam . Musculoskeletal: not rigid . Psychiatric:unable to assess . Neurologic: no seizure no involuntary movements         Lab Data:   Basic Metabolic Panel: Recent Labs  Lab 07/19/20 0511 07/22/20 0324  NA 140 136  K 4.3 4.4  CL 97* 97*  CO2 31 29  GLUCOSE 155* 164*  BUN 21 19  CREATININE <0.30* <0.30*  CALCIUM 9.1 8.7*    ABG: Recent Labs  Lab 07/17/20 1000 07/18/20 1235 07/18/20 1500 07/19/20 2150  PHART 7.439 7.401 7.366 7.413  PCO2ART 46.7 54.0* 58.9* 53.1*  PO2ART 121* 50.8* 72.3* 163*  HCO3 31.2* 33.0* 33.0* 33.9*  O2SAT 98.8 85.6 93.7 99.3    Liver Function Tests: Recent Labs  Lab 07/19/20 0511  AST 31  ALT 82*  ALKPHOS 51  BILITOT 0.6  PROT 4.8*  ALBUMIN 2.6*   No results for input(s): LIPASE, AMYLASE in the last 168 hours. No results for input(s): AMMONIA in the last 168 hours.  CBC: Recent Labs  Lab 07/19/20 0755 07/22/20 0324  WBC 10.9* 5.8  HGB 14.4 12.3*  HCT 42.2 36.3*  MCV 87.7 88.5  PLT 140* 101*     Cardiac Enzymes: No results for input(s): CKTOTAL, CKMB, CKMBINDEX, TROPONINI in the last 168 hours.  BNP (last 3 results) No results for input(s): BNP in the last 8760 hours.  ProBNP (last 3 results) No results for input(s): PROBNP in the last 8760 hours.  Radiological Exams: No results found.  Assessment/Plan Active Problems:   Acute on chronic respiratory failure with hypoxia (HCC)   Acute inflammatory demyelinating polyneuropathy (HCC)   Dysautonomia (HCC)   Atrial fibrillation with RVR (HCC)   Healthcare associated bacterial pneumonia   1. Acute on chronic respiratory failure hypoxia we will continue with pressure support patient is on 40% FiO2 2. Acute inflammatory demyelinating polyneuropathy no change we will continue to follow 3. Dysautonomia patient is at baseline 4. Atrial fibrillation rate controlled 5. Healthcare associated pneumonia at baseline   I have personally seen and evaluated the patient, evaluated laboratory and imaging results, formulated the assessment and plan and placed orders. The Patient requires high complexity decision making with multiple systems involvement.  Rounds were done with the Respiratory Therapy Director and Staff therapists and discussed with nursing staff also.  Yevonne Pax, MD [facility] Pulmonary Critical Care Medicine Sleep Medicine

## 2020-07-25 DIAGNOSIS — G901 Familial dysautonomia [Riley-Day]: Secondary | ICD-10-CM | POA: Diagnosis not present

## 2020-07-25 DIAGNOSIS — G610 Guillain-Barre syndrome: Secondary | ICD-10-CM | POA: Diagnosis not present

## 2020-07-25 DIAGNOSIS — I4891 Unspecified atrial fibrillation: Secondary | ICD-10-CM | POA: Diagnosis not present

## 2020-07-25 DIAGNOSIS — J9621 Acute and chronic respiratory failure with hypoxia: Secondary | ICD-10-CM | POA: Diagnosis not present

## 2020-07-25 NOTE — Progress Notes (Signed)
Pulmonary Critical Care Medicine [facility] [place]   PULMONARY CRITICAL CARE SERVICE  PROGRESS NOTE  Date of Service: 07/25/2020  Brian Espinoza  [id number]  DOB: 1955-10-24   DOA: 06/05/2020  Referring Physician: Carron Curie, MD  HPI: Brian Espinoza is a 65 y.o. male seen for follow up of Acute on Chronic Respiratory Failure.  Patient was able to do 4.5 hours of pressure support yesterday today goal is is for 8 hours  Medications: Reviewed on Rounds  Physical Exam:  Vitals: Temperature is 97.8 pulse 104 respiratory rate 28 blood pressure 114/70 saturations 98%  Ventilator Settings currently is on pressure support 12/5  . General: Comfortable at this time . Eyes: Grossly normal lids, irises & conjunctiva . ENT: grossly tongue is normal . Neck: no obvious mass . Cardiovascular: S1 S2 normal no gallop . Respiratory: No rhonchi no rales are noted at this time . Abdomen: soft . Skin: no rash seen on limited exam . Musculoskeletal: not rigid . Psychiatric:unable to assess . Neurologic: no seizure no involuntary movements         Lab Data:   Basic Metabolic Panel: Recent Labs  Lab 07/19/20 0511 07/22/20 0324  NA 140 136  K 4.3 4.4  CL 97* 97*  CO2 31 29  GLUCOSE 155* 164*  BUN 21 19  CREATININE <0.30* <0.30*  CALCIUM 9.1 8.7*    ABG: Recent Labs  Lab 07/18/20 1235 07/18/20 1500 07/19/20 2150  PHART 7.401 7.366 7.413  PCO2ART 54.0* 58.9* 53.1*  PO2ART 50.8* 72.3* 163*  HCO3 33.0* 33.0* 33.9*  O2SAT 85.6 93.7 99.3    Liver Function Tests: Recent Labs  Lab 07/19/20 0511  AST 31  ALT 82*  ALKPHOS 51  BILITOT 0.6  PROT 4.8*  ALBUMIN 2.6*   No results for input(s): LIPASE, AMYLASE in the last 168 hours. No results for input(s): AMMONIA in the last 168 hours.  CBC: Recent Labs  Lab 07/19/20 0755 07/22/20 0324  WBC 10.9* 5.8  HGB 14.4 12.3*  HCT 42.2 36.3*  MCV 87.7 88.5  PLT 140* 101*    Cardiac Enzymes: No results for input(s):  CKTOTAL, CKMB, CKMBINDEX, TROPONINI in the last 168 hours.  BNP (last 3 results) No results for input(s): BNP in the last 8760 hours.  ProBNP (last 3 results) No results for input(s): PROBNP in the last 8760 hours.  Radiological Exams: No results found.  Assessment/Plan Active Problems:   Acute on chronic respiratory failure with hypoxia (HCC)   Acute inflammatory demyelinating polyneuropathy (HCC)   Dysautonomia (HCC)   Atrial fibrillation with RVR (HCC)   Healthcare associated bacterial pneumonia   1. Acute on chronic respiratory failure hypoxia we will continue to try to wean patient is doing well so far 2. Acute inflammatory demyelinating polyneuropathy because of the profound neuromuscular weakness need to let the patient rest on the ventilator at nighttime 3. Dysautonomia no changes 4. Atrial fibrillation rate controlled 5. Healthcare associated pneumonia treated slow improvement   I have personally seen and evaluated the patient, evaluated laboratory and imaging results, formulated the assessment and plan and placed orders. The Patient requires high complexity decision making with multiple systems involvement.  Rounds were done with the Respiratory Therapy Director and Staff therapists and discussed with nursing staff also.  Yevonne Pax, MD [facility] Pulmonary Critical Care Medicine Sleep Medicine

## 2020-07-26 DIAGNOSIS — G610 Guillain-Barre syndrome: Secondary | ICD-10-CM | POA: Diagnosis not present

## 2020-07-26 DIAGNOSIS — I4891 Unspecified atrial fibrillation: Secondary | ICD-10-CM | POA: Diagnosis not present

## 2020-07-26 DIAGNOSIS — J9621 Acute and chronic respiratory failure with hypoxia: Secondary | ICD-10-CM | POA: Diagnosis not present

## 2020-07-26 DIAGNOSIS — G901 Familial dysautonomia [Riley-Day]: Secondary | ICD-10-CM | POA: Diagnosis not present

## 2020-07-26 LAB — BASIC METABOLIC PANEL
Anion gap: 12 (ref 5–15)
BUN: 16 mg/dL (ref 8–23)
CO2: 31 mmol/L (ref 22–32)
Calcium: 9.3 mg/dL (ref 8.9–10.3)
Chloride: 97 mmol/L — ABNORMAL LOW (ref 98–111)
Creatinine, Ser: 0.34 mg/dL — ABNORMAL LOW (ref 0.61–1.24)
GFR, Estimated: >60 mL/min (ref >60)
Glucose, Bld: 253 mg/dL — ABNORMAL HIGH (ref 70–99)
Potassium: 4.2 mmol/L (ref 3.5–5.1)
Sodium: 140 mmol/L (ref 135–145)

## 2020-07-26 LAB — CBC
HCT: 38.6 % — ABNORMAL LOW (ref 39.0–52.0)
Hemoglobin: 12.8 g/dL — ABNORMAL LOW (ref 13.0–17.0)
MCH: 30 pg (ref 26.0–34.0)
MCHC: 33.2 g/dL (ref 30.0–36.0)
MCV: 90.4 fL (ref 80.0–100.0)
Platelets: 107 10*3/uL — ABNORMAL LOW (ref 150–400)
RBC: 4.27 MIL/uL (ref 4.22–5.81)
RDW: 17.9 % — ABNORMAL HIGH (ref 11.5–15.5)
WBC: 6.9 10*3/uL (ref 4.0–10.5)
nRBC: 0 % (ref 0.0–0.2)

## 2020-07-26 NOTE — Progress Notes (Signed)
Pulmonary Critical Care Medicine [facility] [place]   PULMONARY CRITICAL CARE SERVICE  PROGRESS NOTE  Date of Service: 07/26/2020  Brian Espinoza  [id number]  DOB: [birth year]   DOA: 06/05/2020  Referring Physician: Carron Curie, MD  HPI: Brian Espinoza is a 65 y.o. male seen for follow up of Acute on Chronic Respiratory Failure.  Patient remains on the ventilator right now is on assist control mode  Medications: Reviewed on Rounds  Physical Exam:  Vitals: Temperature is 97.6 pulse 100 respiratory 26 blood pressure is 97/57 saturations 100%  Ventilator Settings on assist control FiO2 40% PEEP 5 tidal volume 400  . General: Comfortable at this time . Eyes: Grossly normal lids, irises & conjunctiva . ENT: grossly tongue is normal . Neck: no obvious mass . Cardiovascular: S1 S2 normal no gallop . Respiratory: Scattered rhonchi expansion is equal . Abdomen: soft . Skin: no rash seen on limited exam . Musculoskeletal: not rigid . Psychiatric:unable to assess . Neurologic: no seizure no involuntary movements         Lab Data:   Basic Metabolic Panel: Recent Labs  Lab 07/22/20 0324  NA 136  K 4.4  CL 97*  CO2 29  GLUCOSE 164*  BUN 19  CREATININE <0.30*  CALCIUM 8.7*    ABG: Recent Labs  Lab 07/19/20 2150  PHART 7.413  PCO2ART 53.1*  PO2ART 163*  HCO3 33.9*  O2SAT 99.3    Liver Function Tests: No results for input(s): AST, ALT, ALKPHOS, BILITOT, PROT, ALBUMIN in the last 168 hours. No results for input(s): LIPASE, AMYLASE in the last 168 hours. No results for input(s): AMMONIA in the last 168 hours.  CBC: Recent Labs  Lab 07/22/20 0324  WBC 5.8  HGB 12.3*  HCT 36.3*  MCV 88.5  PLT 101*    Cardiac Enzymes: No results for input(s): CKTOTAL, CKMB, CKMBINDEX, TROPONINI in the last 168 hours.  BNP (last 3 results) No results for input(s): BNP in the last 8760 hours.  ProBNP (last 3 results) No results for input(s): PROBNP in the last 8760  hours.  Radiological Exams: No results found.  Assessment/Plan Active Problems:   Acute on chronic respiratory failure with hypoxia (HCC)   Acute inflammatory demyelinating polyneuropathy (HCC)   Dysautonomia (HCC)   Atrial fibrillation with RVR (HCC)   Healthcare associated bacterial pneumonia   1. Acute on chronic respiratory failure hypoxia we will continue with full support on the ventilator patient is on 40% FiO2 good saturations are noted 2. Acute inflammatory demyelinating polyneuropathy no change we will continue with supportive care 3. Dysautonomia patient is at baseline 4. Atrial fibrillation rate is controlled 5. Healthcare associated pneumonia at baseline   I have personally seen and evaluated the patient, evaluated laboratory and imaging results, formulated the assessment and plan and placed orders. The Patient requires high complexity decision making with multiple systems involvement.  Rounds were done with the Respiratory Therapy Director and Staff therapists and discussed with nursing staff also.  Yevonne Pax, MD [facility] Pulmonary Critical Care Medicine Sleep Medicine

## 2020-07-26 NOTE — Consult Note (Signed)
Ref: Elana Alm, MD   Subjective:  Atrial fibrillation. Now with episodes of RVR on average dose of amiodarone, metoprolol and diltiazem use. Low BP.   Objective:  Vital Signs in the last 24 hours: BP: 84/59, PP: 89, R: 22, O2 sat 100 % on FiO2 40 %, 16 A/C   Physical Exam: BP Readings from Last 1 Encounters:  05/06/19 124/79     Wt Readings from Last 1 Encounters:  04/04/14 101.2 kg    Weight change:  There is no height or weight on file to calculate BMI. HEENT: [place]/AT, Eyes-Blue, Conjunctiva-Pink, Sclera-Non-icteric Neck: No JVD, No bruit, Trachea midline. Lungs:  Clearing, Bilateral. Cardiac:  Regular rhythm, normal S1 and S2, no S3. II/VI systolic murmur. Abdomen:  Soft, non-tender. BS present. Extremities:  1 + edema present. No cyanosis. No clubbing. CNS: AxOx0.  Skin: Warm and dry.   Intake/Output from previous day: No intake/output data recorded.    Lab Results: BMET    Component Value Date/Time   NA 136 07/22/2020 0324   NA 140 07/19/2020 0511   NA 137 07/16/2020 0624   K 4.4 07/22/2020 0324   K 4.3 07/19/2020 0511   K 4.5 07/16/2020 0624   CL 97 (L) 07/22/2020 0324   CL 97 (L) 07/19/2020 0511   CL 96 (L) 07/16/2020 0624   CO2 29 07/22/2020 0324   CO2 31 07/19/2020 0511   CO2 29 07/16/2020 0624   GLUCOSE 164 (H) 07/22/2020 0324   GLUCOSE 155 (H) 07/19/2020 0511   GLUCOSE 233 (H) 07/16/2020 0624   BUN 19 07/22/2020 0324   BUN 21 07/19/2020 0511   BUN 24 (H) 07/16/2020 0624   CREATININE <0.30 (L) 07/22/2020 0324   CREATININE <0.30 (L) 07/19/2020 0511   CREATININE 0.39 (L) 07/16/2020 0624   CALCIUM 8.7 (L) 07/22/2020 0324   CALCIUM 9.1 07/19/2020 0511   CALCIUM 9.0 07/16/2020 0624   GFRNONAA NOT CALCULATED 07/22/2020 0324   GFRNONAA NOT CALCULATED 07/19/2020 0511   GFRNONAA >60 07/16/2020 0624   CBC    Component Value Date/Time   WBC 5.8 07/22/2020 0324   RBC 4.10 (L) 07/22/2020 0324   HGB 12.3 (L) 07/22/2020 0324   HCT 36.3 (L)  07/22/2020 0324   PLT 101 (L) 07/22/2020 0324   MCV 88.5 07/22/2020 0324   MCH 30.0 07/22/2020 0324   MCHC 33.9 07/22/2020 0324   RDW 17.5 (H) 07/22/2020 0324   LYMPHSABS 2.3 06/06/2020 0418   MONOABS 0.9 06/06/2020 0418   EOSABS 0.0 06/06/2020 0418   BASOSABS 0.1 06/06/2020 0418   HEPATIC Function Panel Recent Labs    06/06/20 0418 07/19/20 0511  PROT 6.8 4.8*   HEMOGLOBIN A1C No components found for: HGA1C,  MPG CARDIAC ENZYMES Lab Results  Component Value Date   CKTOTAL 44 (L) 07/02/2020   BNP No results for input(s): PROBNP in the last 8760 hours. TSH Recent Labs    06/06/20 0418  TSH 3.257   CHOLESTEROL No results for input(s): CHOL in the last 8760 hours.  Scheduled Meds: Continuous Infusions: PRN Meds:.  Assessment/Plan: Acute on chronic respiratory failure with hypoxia Atrial fibrillation with RVR CAD HCM without obstruction Metabolic encephalopathy S/P acute demyelinating polyneuropathy S/P tracheostomy S/p PEG placement S/P pneumonia  Add small dose lanoxin for heart rate control. Continue other medications.   LOS: 0 days   Time spent including chart review, lab review, examination, discussion with patient/Nurse : 30 min   Orpah Cobb  MD  07/26/2020, 4:28 PM

## 2020-07-27 DIAGNOSIS — I4891 Unspecified atrial fibrillation: Secondary | ICD-10-CM | POA: Diagnosis not present

## 2020-07-27 DIAGNOSIS — J9621 Acute and chronic respiratory failure with hypoxia: Secondary | ICD-10-CM | POA: Diagnosis not present

## 2020-07-27 DIAGNOSIS — G610 Guillain-Barre syndrome: Secondary | ICD-10-CM | POA: Diagnosis not present

## 2020-07-27 DIAGNOSIS — G901 Familial dysautonomia [Riley-Day]: Secondary | ICD-10-CM | POA: Diagnosis not present

## 2020-07-27 LAB — COMPREHENSIVE METABOLIC PANEL
ALT: 77 U/L — ABNORMAL HIGH (ref 0–44)
AST: 20 U/L (ref 15–41)
Albumin: 2.4 g/dL — ABNORMAL LOW (ref 3.5–5.0)
Alkaline Phosphatase: 49 U/L (ref 38–126)
Anion gap: 11 (ref 5–15)
BUN: 14 mg/dL (ref 8–23)
CO2: 31 mmol/L (ref 22–32)
Calcium: 9.1 mg/dL (ref 8.9–10.3)
Chloride: 96 mmol/L — ABNORMAL LOW (ref 98–111)
Creatinine, Ser: <0.3 mg/dL — ABNORMAL LOW (ref 0.61–1.24)
Glucose, Bld: 131 mg/dL — ABNORMAL HIGH (ref 70–99)
Potassium: 3.7 mmol/L (ref 3.5–5.1)
Sodium: 138 mmol/L (ref 135–145)
Total Bilirubin: 0.6 mg/dL (ref 0.3–1.2)
Total Protein: 4.9 g/dL — ABNORMAL LOW (ref 6.5–8.1)

## 2020-07-27 LAB — CBC
HCT: 33.6 % — ABNORMAL LOW (ref 39.0–52.0)
Hemoglobin: 10.9 g/dL — ABNORMAL LOW (ref 13.0–17.0)
MCH: 29.2 pg (ref 26.0–34.0)
MCHC: 32.4 g/dL (ref 30.0–36.0)
MCV: 90.1 fL (ref 80.0–100.0)
Platelets: 110 10*3/uL — ABNORMAL LOW (ref 150–400)
RBC: 3.73 MIL/uL — ABNORMAL LOW (ref 4.22–5.81)
RDW: 17.2 % — ABNORMAL HIGH (ref 11.5–15.5)
WBC: 6.4 10*3/uL (ref 4.0–10.5)
nRBC: 0 % (ref 0.0–0.2)

## 2020-07-27 NOTE — Progress Notes (Signed)
Pulmonary Critical Care Medicine [facility] [place]   PULMONARY CRITICAL CARE SERVICE  PROGRESS NOTE  Date of Service: 07/27/2020  Brian Espinoza  TML:[id number]  DOB: [birth year]   DOA: 06/05/2020  Referring Physician: Carron Curie, MD  HPI: Brian Espinoza is a 65 y.o. male seen for follow up of Acute on Chronic Respiratory Failure.  Patient was attempted on weaning this morning failed now is back on vent support  Medications: Reviewed on Rounds  Physical Exam:  Vitals: Temperature is 96.1 pulse 120 respiratory 26 blood pressure is 115/71 saturations 100%  Ventilator Settings on assist control FiO2 40% tidal volume is 400 PEEP of 5  . General: Comfortable at this time . Eyes: Grossly normal lids, irises & conjunctiva . ENT: grossly tongue is normal . Neck: no obvious mass . Cardiovascular: S1 S2 normal no gallop . Respiratory: Coarse breath sounds with few scattered rhonchi . Abdomen: soft . Skin: no rash seen on limited exam . Musculoskeletal: not rigid . Psychiatric:unable to assess . Neurologic: no seizure no involuntary movements         Lab Data:   Basic Metabolic Panel: Recent Labs  Lab 07/22/20 0324 07/26/20 1544 07/27/20 0433  NA 136 140 138  K 4.4 4.2 3.7  CL 97* 97* 96*  CO2 29 31 31   GLUCOSE 164* 253* 131*  BUN 19 16 14   CREATININE <0.30* 0.34* <0.30*  CALCIUM 8.7* 9.3 9.1    ABG: No results for input(s): PHART, PCO2ART, PO2ART, HCO3, O2SAT in the last 168 hours.  Liver Function Tests: Recent Labs  Lab 07/27/20 0433  AST 20  ALT 77*  ALKPHOS 49  BILITOT 0.6  PROT 4.9*  ALBUMIN 2.4*   No results for input(s): LIPASE, AMYLASE in the last 168 hours. No results for input(s): AMMONIA in the last 168 hours.  CBC: Recent Labs  Lab 07/22/20 0324 07/26/20 1544 07/27/20 0433  WBC 5.8 6.9 6.4  HGB 12.3* 12.8* 10.9*  HCT 36.3* 38.6* 33.6*  MCV 88.5 90.4 90.1  PLT 101* 107* 110*    Cardiac Enzymes: No results for input(s): CKTOTAL,  CKMB, CKMBINDEX, TROPONINI in the last 168 hours.  BNP (last 3 results) No results for input(s): BNP in the last 8760 hours.  ProBNP (last 3 results) No results for input(s): PROBNP in the last 8760 hours.  Radiological Exams: No results found.  Assessment/Plan Active Problems:   Acute on chronic respiratory failure with hypoxia (HCC)   Acute inflammatory demyelinating polyneuropathy (HCC)   Dysautonomia (HCC)   Atrial fibrillation with RVR (HCC)   Healthcare associated bacterial pneumonia   1. Acute on chronic respiratory failure hypoxia we will continue with assist control titrate oxygen continue pulmonary toilet 2. Acute inflammatory demyelinating polyneuropathy no change 3. Dysautonomia still having issues with tachycardia 4. Atrial fibrillation rate controlled followed by cardiology 5. Healthcare associated pneumonia treated slow improvement   I have personally seen and evaluated the patient, evaluated laboratory and imaging results, formulated the assessment and plan and placed orders. The Patient requires high complexity decision making with multiple systems involvement.  Rounds were done with the Respiratory Therapy Director and Staff therapists and discussed with nursing staff also.  09/25/20, MD [facility] Pulmonary Critical Care Medicine Sleep Medicine

## 2020-07-28 DIAGNOSIS — I4891 Unspecified atrial fibrillation: Secondary | ICD-10-CM | POA: Diagnosis not present

## 2020-07-28 DIAGNOSIS — G610 Guillain-Barre syndrome: Secondary | ICD-10-CM | POA: Diagnosis not present

## 2020-07-28 DIAGNOSIS — J9621 Acute and chronic respiratory failure with hypoxia: Secondary | ICD-10-CM | POA: Diagnosis not present

## 2020-07-28 DIAGNOSIS — G901 Familial dysautonomia [Riley-Day]: Secondary | ICD-10-CM | POA: Diagnosis not present

## 2020-07-28 NOTE — Progress Notes (Signed)
Pulmonary Critical Care Medicine [facility] [place]   PULMONARY CRITICAL CARE SERVICE  PROGRESS NOTE  Date of Service: 07/28/2020  Brian Espinoza  WPY:[id number]  DOB: 16-Jun-1955   DOA: 06/05/2020  Referring Physician: Carron Curie, MD  HPI: Brian Espinoza is a 65 y.o. male seen for follow up of Acute on Chronic Respiratory Failure.  Patient is on full support on assist control mode remains afebrile  Medications: Reviewed on Rounds  Physical Exam:  Vitals: Temperature is 97.7 pulse 134 respiratory 36 blood pressure is 114/73 saturations 100%  Ventilator Settings on assist control FiO2 40% tidal volume 400 PEEP 5  . General: Comfortable at this time . Eyes: Grossly normal lids, irises & conjunctiva . ENT: grossly tongue is normal . Neck: no obvious mass . Cardiovascular: S1 S2 normal no gallop . Respiratory: Coarse rhonchi expansion is equal . Abdomen: soft . Skin: no rash seen on limited exam . Musculoskeletal: not rigid . Psychiatric:unable to assess . Neurologic: no seizure no involuntary movements         Lab Data:   Basic Metabolic Panel: Recent Labs  Lab 07/22/20 0324 07/26/20 1544 07/27/20 0433  NA 136 140 138  K 4.4 4.2 3.7  CL 97* 97* 96*  CO2 29 31 31   GLUCOSE 164* 253* 131*  BUN 19 16 14   CREATININE <0.30* 0.34* <0.30*  CALCIUM 8.7* 9.3 9.1    ABG: No results for input(s): PHART, PCO2ART, PO2ART, HCO3, O2SAT in the last 168 hours.  Liver Function Tests: Recent Labs  Lab 07/27/20 0433  AST 20  ALT 77*  ALKPHOS 49  BILITOT 0.6  PROT 4.9*  ALBUMIN 2.4*   No results for input(s): LIPASE, AMYLASE in the last 168 hours. No results for input(s): AMMONIA in the last 168 hours.  CBC: Recent Labs  Lab 07/22/20 0324 07/26/20 1544 07/27/20 0433  WBC 5.8 6.9 6.4  HGB 12.3* 12.8* 10.9*  HCT 36.3* 38.6* 33.6*  MCV 88.5 90.4 90.1  PLT 101* 107* 110*    Cardiac Enzymes: No results for input(s): CKTOTAL, CKMB, CKMBINDEX, TROPONINI in the  last 168 hours.  BNP (last 3 results) No results for input(s): BNP in the last 8760 hours.  ProBNP (last 3 results) No results for input(s): PROBNP in the last 8760 hours.  Radiological Exams: No results found.  Assessment/Plan Active Problems:   Acute on chronic respiratory failure with hypoxia (HCC)   Acute inflammatory demyelinating polyneuropathy (HCC)   Dysautonomia (HCC)   Atrial fibrillation with RVR (HCC)   Healthcare associated bacterial pneumonia   1. Acute on chronic respiratory failure hypoxia we will continue with assist control mode and full support respiratory therapy will reassess 2. Acute demyelinating polyneuropathy no change supportive care 3. Dysautonomia heart rate remains patient 4. Atrial fibrillation cardiology following 5. Healthcare associated pneumonia treated   I have personally seen and evaluated the patient, evaluated laboratory and imaging results, formulated the assessment and plan and placed orders. The Patient requires high complexity decision making with multiple systems involvement.  Rounds were done with the Respiratory Therapy Director and Staff therapists and discussed with nursing staff also.  09/25/20, MD [facility] Pulmonary Critical Care Medicine Sleep Medicine

## 2020-07-29 DIAGNOSIS — J9621 Acute and chronic respiratory failure with hypoxia: Secondary | ICD-10-CM | POA: Diagnosis not present

## 2020-07-29 DIAGNOSIS — G901 Familial dysautonomia [Riley-Day]: Secondary | ICD-10-CM | POA: Diagnosis not present

## 2020-07-29 DIAGNOSIS — G610 Guillain-Barre syndrome: Secondary | ICD-10-CM | POA: Diagnosis not present

## 2020-07-29 DIAGNOSIS — I4891 Unspecified atrial fibrillation: Secondary | ICD-10-CM | POA: Diagnosis not present

## 2020-07-29 LAB — HEPARIN LEVEL (UNFRACTIONATED): Heparin Unfractionated: 1.68 IU/mL — ABNORMAL HIGH (ref 0.30–0.70)

## 2020-07-29 NOTE — Consult Note (Signed)
Ref: Elana Alm, MD   Subjective:  Resting comfortably. HR 80's. BP is also on low side.  Objective:  Vital Signs in the last 24 hours: BP: 84/41. P:83, R: 16, O2 sat is 98 % FiO2 40 %.   Physical Exam: BP Readings from Last 1 Encounters:  05/06/19 124/79     Wt Readings from Last 1 Encounters:  04/04/14 101.2 kg    Weight change:  There is no height or weight on file to calculate BMI. HEENT: [place]/AT, Eyes-Blue, Conjunctiva-Pale pink, Sclera-Non-icteric Neck: No JVD, No bruit, Ventilator support through Tracheostomy tube present. Lungs:  Clearing, Bilateral. Cardiac:  Irregular rhythm, normal S1 and S2, no S3. II/VI systolic murmur. Abdomen:  Soft, non-tender. BS present. Extremities:  No edema present. No cyanosis. No clubbing. CNS: AxOx0.  Skin: Warm and dry.   Intake/Output from previous day: No intake/output data recorded.    Lab Results: BMET    Component Value Date/Time   NA 138 07/27/2020 0433   NA 140 07/26/2020 1544   NA 136 07/22/2020 0324   K 3.7 07/27/2020 0433   K 4.2 07/26/2020 1544   K 4.4 07/22/2020 0324   CL 96 (L) 07/27/2020 0433   CL 97 (L) 07/26/2020 1544   CL 97 (L) 07/22/2020 0324   CO2 31 07/27/2020 0433   CO2 31 07/26/2020 1544   CO2 29 07/22/2020 0324   GLUCOSE 131 (H) 07/27/2020 0433   GLUCOSE 253 (H) 07/26/2020 1544   GLUCOSE 164 (H) 07/22/2020 0324   BUN 14 07/27/2020 0433   BUN 16 07/26/2020 1544   BUN 19 07/22/2020 0324   CREATININE <0.30 (L) 07/27/2020 0433   CREATININE 0.34 (L) 07/26/2020 1544   CREATININE <0.30 (L) 07/22/2020 0324   CALCIUM 9.1 07/27/2020 0433   CALCIUM 9.3 07/26/2020 1544   CALCIUM 8.7 (L) 07/22/2020 0324   GFRNONAA NOT CALCULATED 07/27/2020 0433   GFRNONAA >60 07/26/2020 1544   GFRNONAA NOT CALCULATED 07/22/2020 0324   CBC    Component Value Date/Time   WBC 6.4 07/27/2020 0433   RBC 3.73 (L) 07/27/2020 0433   HGB 10.9 (L) 07/27/2020 0433   HCT 33.6 (L) 07/27/2020 0433   PLT 110 (L)  07/27/2020 0433   MCV 90.1 07/27/2020 0433   MCH 29.2 07/27/2020 0433   MCHC 32.4 07/27/2020 0433   RDW 17.2 (H) 07/27/2020 0433   LYMPHSABS 2.3 06/06/2020 0418   MONOABS 0.9 06/06/2020 0418   EOSABS 0.0 06/06/2020 0418   BASOSABS 0.1 06/06/2020 0418   HEPATIC Function Panel Recent Labs    06/06/20 0418 07/19/20 0511 07/27/20 0433  PROT 6.8 4.8* 4.9*   HEMOGLOBIN A1C No components found for: HGA1C,  MPG CARDIAC ENZYMES Lab Results  Component Value Date   CKTOTAL 44 (L) 07/02/2020   BNP No results for input(s): PROBNP in the last 8760 hours. TSH Recent Labs    06/06/20 0418  TSH 3.257   CHOLESTEROL No results for input(s): CHOL in the last 8760 hours.  Scheduled Meds: Continuous Infusions: PRN Meds:.  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 Try 100 cc saline bolus for low blood pressure.   LOS: 0 days   Time spent including chart review, lab review, examination, discussion with patient/Nurse : 30 min   Orpah Cobb  MD  07/29/2020, 12:08 PM

## 2020-07-29 NOTE — Consult Note (Signed)
Ref: Elana Alm, MD   Subjective:  Episodes of atrial fibrillation with RVR continues. Amswered questions by wife and daughter.  Objective:  Vital Signs in the last 24 hours: BP: 110/60, P: 140, R: 24, O2 sat 100 % FiO2 40 %.  Physical Exam: BP Readings from Last 1 Encounters:  05/06/19 124/79     Wt Readings from Last 1 Encounters:  04/04/14 101.2 kg    Weight change:  There is no height or weight on file to calculate BMI. HEENT: [place]/AT, Eyes-Blue, Conjunctiva-Pale pink, Sclera-Non-icteric Neck: No JVD, No bruit, Tracheostomy in place. Lungs:  Clearing, Bilateral. Cardiac:  Irregular rapid rhythm, normal S1 and S2, no S3. II/VI systolic murmur. Abdomen:  Soft, non-tender. BS present. Extremities:  1 + edema present. No cyanosis. No clubbing. CNS: AxOx0, moves head randomly.  Skin: Warm and dry.   Intake/Output from previous day: No intake/output data recorded.    Lab Results: BMET    Component Value Date/Time   NA 138 07/27/2020 0433   NA 140 07/26/2020 1544   NA 136 07/22/2020 0324   K 3.7 07/27/2020 0433   K 4.2 07/26/2020 1544   K 4.4 07/22/2020 0324   CL 96 (L) 07/27/2020 0433   CL 97 (L) 07/26/2020 1544   CL 97 (L) 07/22/2020 0324   CO2 31 07/27/2020 0433   CO2 31 07/26/2020 1544   CO2 29 07/22/2020 0324   GLUCOSE 131 (H) 07/27/2020 0433   GLUCOSE 253 (H) 07/26/2020 1544   GLUCOSE 164 (H) 07/22/2020 0324   BUN 14 07/27/2020 0433   BUN 16 07/26/2020 1544   BUN 19 07/22/2020 0324   CREATININE <0.30 (L) 07/27/2020 0433   CREATININE 0.34 (L) 07/26/2020 1544   CREATININE <0.30 (L) 07/22/2020 0324   CALCIUM 9.1 07/27/2020 0433   CALCIUM 9.3 07/26/2020 1544   CALCIUM 8.7 (L) 07/22/2020 0324   GFRNONAA NOT CALCULATED 07/27/2020 0433   GFRNONAA >60 07/26/2020 1544   GFRNONAA NOT CALCULATED 07/22/2020 0324   CBC    Component Value Date/Time   WBC 6.4 07/27/2020 0433   RBC 3.73 (L) 07/27/2020 0433   HGB 10.9 (L) 07/27/2020 0433   HCT 33.6 (L)  07/27/2020 0433   PLT 110 (L) 07/27/2020 0433   MCV 90.1 07/27/2020 0433   MCH 29.2 07/27/2020 0433   MCHC 32.4 07/27/2020 0433   RDW 17.2 (H) 07/27/2020 0433   LYMPHSABS 2.3 06/06/2020 0418   MONOABS 0.9 06/06/2020 0418   EOSABS 0.0 06/06/2020 0418   BASOSABS 0.1 06/06/2020 0418   HEPATIC Function Panel Recent Labs    06/06/20 0418 07/19/20 0511 07/27/20 0433  PROT 6.8 4.8* 4.9*   HEMOGLOBIN A1C No components found for: HGA1C,  MPG CARDIAC ENZYMES Lab Results  Component Value Date   CKTOTAL 44 (L) 07/02/2020   BNP No results for input(s): PROBNP in the last 8760 hours. TSH Recent Labs    06/06/20 0418  TSH 3.257   CHOLESTEROL No results for input(s): CHOL in the last 8760 hours.  Scheduled Meds: Continuous Infusions: PRN Meds:.  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   LOS: 0 days   Time spent including chart review, lab review, examination, discussion with patient/Family/Nurse : 40 min   Orpah Cobb  MD  07/29/2020, 12:01 PM

## 2020-07-29 NOTE — Progress Notes (Signed)
Pulmonary Critical Care Medicine [facility] [place]   PULMONARY CRITICAL CARE SERVICE  PROGRESS NOTE  Date of Service: 07/29/2020  Brian Espinoza  SLH:[id number]  DOB: 01-20-1956   DOA: 06/05/2020  Referring Physician: Carron Curie, MD  HPI: Brian Espinoza is a 65 y.o. male seen for follow up of Acute on Chronic Respiratory Failure.  Patient is comfortable right now without distress at this time remains on pressure support on 40% FiO2  Medications: Reviewed on Rounds  Physical Exam:  Vitals: Temperature is 96.5 pulse 87 respiratory rate is 25 blood pressure 114/70 saturations 98%  Ventilator Settings on pressure support FiO2 40% pressure 12/5  . General: Comfortable at this time . Eyes: Grossly normal lids, irises & conjunctiva . ENT: grossly tongue is normal . Neck: no obvious mass . Cardiovascular: S1 S2 normal no gallop . Respiratory: Scattered rhonchi expansion is equal . Abdomen: soft . Skin: no rash seen on limited exam . Musculoskeletal: not rigid . Psychiatric:unable to assess . Neurologic: no seizure no involuntary movements         Lab Data:   Basic Metabolic Panel: Recent Labs  Lab 07/26/20 1544 07/27/20 0433  NA 140 138  K 4.2 3.7  CL 97* 96*  CO2 31 31  GLUCOSE 253* 131*  BUN 16 14  CREATININE 0.34* <0.30*  CALCIUM 9.3 9.1    ABG: No results for input(s): PHART, PCO2ART, PO2ART, HCO3, O2SAT in the last 168 hours.  Liver Function Tests: Recent Labs  Lab 07/27/20 0433  AST 20  ALT 77*  ALKPHOS 49  BILITOT 0.6  PROT 4.9*  ALBUMIN 2.4*   No results for input(s): LIPASE, AMYLASE in the last 168 hours. No results for input(s): AMMONIA in the last 168 hours.  CBC: Recent Labs  Lab 07/26/20 1544 07/27/20 0433  WBC 6.9 6.4  HGB 12.8* 10.9*  HCT 38.6* 33.6*  MCV 90.4 90.1  PLT 107* 110*    Cardiac Enzymes: No results for input(s): CKTOTAL, CKMB, CKMBINDEX, TROPONINI in the last 168 hours.  BNP (last 3 results) No results for  input(s): BNP in the last 8760 hours.  ProBNP (last 3 results) No results for input(s): PROBNP in the last 8760 hours.  Radiological Exams: No results found.  Assessment/Plan Active Problems:   Acute on chronic respiratory failure with hypoxia (HCC)   Acute inflammatory demyelinating polyneuropathy (HCC)   Dysautonomia (HCC)   Atrial fibrillation with RVR (HCC)   Healthcare associated bacterial pneumonia   1. Acute on chronic respiratory failure hypoxia we will continue with pressure support titrate oxygen as tolerated continue pulmonary toilet 2. Acute inflammatory demyelinating polyneuropathy no change 3. Dysautonomia heart rate under better control after cardiology evaluation 4. Atrial fibrillation rate controlled 5. Healthcare associated pneumonia treated   I have personally seen and evaluated the patient, evaluated laboratory and imaging results, formulated the assessment and plan and placed orders. The Patient requires high complexity decision making with multiple systems involvement.  Rounds were done with the Respiratory Therapy Director and Staff therapists and discussed with nursing staff also.  Yevonne Pax, MD [facility] Pulmonary Critical Care Medicine Sleep Medicine

## 2020-07-30 NOTE — Consult Note (Addendum)
Ref: Elana Alm, MD   Subjective:  Awake.  Involuntary upper body jerky movements continues. HR in 70-110 per nurse.  Objective:  Vital Signs in the last 24 hours: BP: 97/65, P : 118, R: 20, O2 sat 94 % on 40 % FiO2 and 16 A/C.   Physical Exam: BP Readings from Last 1 Encounters:  05/06/19 124/79     Wt Readings from Last 1 Encounters:  04/04/14 101.2 kg    Weight change:  There is no height or weight on file to calculate BMI. HEENT: [place]/AT, Eyes-Blue, Conjunctiva-Pale pink, Sclera-Non-icteric Neck: No JVD, No bruit, Tracheostomy tube in place. Lungs:  Clearing, Bilateral. Cardiac:  Irregular rhythm, normal S1 and S2, no S3. II/VI systolic murmur. Abdomen:  Soft, non-tender. BS present. Extremities:  1 + edema present. No cyanosis. No clubbing. CNS: AxOx0.  Skin: Warm and dry.   Intake/Output from previous day: No intake/output data recorded.    Lab Results: BMET    Component Value Date/Time   NA 138 07/27/2020 0433   NA 140 07/26/2020 1544   NA 136 07/22/2020 0324   K 3.7 07/27/2020 0433   K 4.2 07/26/2020 1544   K 4.4 07/22/2020 0324   CL 96 (L) 07/27/2020 0433   CL 97 (L) 07/26/2020 1544   CL 97 (L) 07/22/2020 0324   CO2 31 07/27/2020 0433   CO2 31 07/26/2020 1544   CO2 29 07/22/2020 0324   GLUCOSE 131 (H) 07/27/2020 0433   GLUCOSE 253 (H) 07/26/2020 1544   GLUCOSE 164 (H) 07/22/2020 0324   BUN 14 07/27/2020 0433   BUN 16 07/26/2020 1544   BUN 19 07/22/2020 0324   CREATININE <0.30 (L) 07/27/2020 0433   CREATININE 0.34 (L) 07/26/2020 1544   CREATININE <0.30 (L) 07/22/2020 0324   CALCIUM 9.1 07/27/2020 0433   CALCIUM 9.3 07/26/2020 1544   CALCIUM 8.7 (L) 07/22/2020 0324   GFRNONAA NOT CALCULATED 07/27/2020 0433   GFRNONAA >60 07/26/2020 1544   GFRNONAA NOT CALCULATED 07/22/2020 0324   CBC    Component Value Date/Time   WBC 6.4 07/27/2020 0433   RBC 3.73 (L) 07/27/2020 0433   HGB 10.9 (L) 07/27/2020 0433   HCT 33.6 (L) 07/27/2020 0433    PLT 110 (L) 07/27/2020 0433   MCV 90.1 07/27/2020 0433   MCH 29.2 07/27/2020 0433   MCHC 32.4 07/27/2020 0433   RDW 17.2 (H) 07/27/2020 0433   LYMPHSABS 2.3 06/06/2020 0418   MONOABS 0.9 06/06/2020 0418   EOSABS 0.0 06/06/2020 0418   BASOSABS 0.1 06/06/2020 0418   HEPATIC Function Panel Recent Labs    06/06/20 0418 07/19/20 0511 07/27/20 0433  PROT 6.8 4.8* 4.9*   HEMOGLOBIN A1C No components found for: HGA1C,  MPG CARDIAC ENZYMES Lab Results  Component Value Date   CKTOTAL 44 (L) 07/02/2020   BNP No results for input(s): PROBNP in the last 8760 hours. TSH Recent Labs    06/06/20 0418  TSH 3.257   CHOLESTEROL No results for input(s): CHOL in the last 8760 hours.  Scheduled Meds: Continuous Infusions: PRN Meds:.  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 Amiodarone.   LOS: 0 days   Time spent including chart review, lab review, examination, discussion with patient/Nurse : 30 min   Orpah Cobb  MD  07/30/2020, 8:36 AM

## 2020-07-31 LAB — BASIC METABOLIC PANEL
Anion gap: 11 (ref 5–15)
BUN: 26 mg/dL — ABNORMAL HIGH (ref 8–23)
CO2: 32 mmol/L (ref 22–32)
Calcium: 9.7 mg/dL (ref 8.9–10.3)
Chloride: 94 mmol/L — ABNORMAL LOW (ref 98–111)
Creatinine, Ser: <0.3 mg/dL — ABNORMAL LOW (ref 0.61–1.24)
Glucose, Bld: 180 mg/dL — ABNORMAL HIGH (ref 70–99)
Potassium: 3.9 mmol/L (ref 3.5–5.1)
Sodium: 137 mmol/L (ref 135–145)

## 2020-07-31 LAB — CBC
HCT: 34.7 % — ABNORMAL LOW (ref 39.0–52.0)
Hemoglobin: 11.6 g/dL — ABNORMAL LOW (ref 13.0–17.0)
MCH: 29.7 pg (ref 26.0–34.0)
MCHC: 33.4 g/dL (ref 30.0–36.0)
MCV: 88.7 fL (ref 80.0–100.0)
Platelets: 118 10*3/uL — ABNORMAL LOW (ref 150–400)
RBC: 3.91 MIL/uL — ABNORMAL LOW (ref 4.22–5.81)
RDW: 17.5 % — ABNORMAL HIGH (ref 11.5–15.5)
WBC: 7.4 10*3/uL (ref 4.0–10.5)
nRBC: 0 % (ref 0.0–0.2)

## 2020-07-31 LAB — MAGNESIUM: Magnesium: 1.9 mg/dL (ref 1.7–2.4)

## 2020-07-31 LAB — SARS CORONAVIRUS 2 (TAT 6-24 HRS): SARS Coronavirus 2: NEGATIVE

## 2020-07-31 NOTE — Consult Note (Signed)
Ref: Elana Alm, MD   Subjective:  Awake.  Atrial fibrillation continues. HR in 90-130 per min. Now on midodrine to improve blood pressure.  Objective:  Vital Signs in the last 24 hours: BP: 120/60, P: 80-130/min, R: 24, O2 sat 96 % on FiO2 40 % and spontaneous breathing.   Physical Exam: BP Readings from Last 1 Encounters:  05/06/19 124/79     Wt Readings from Last 1 Encounters:  04/04/14 101.2 kg    Weight change:  There is no height or weight on file to calculate BMI. HEENT: [place]/AT, Eyes- Blue, Conjunctiva-Pale pink, Sclera-Non-icteric Neck: No JVD, No bruit, Trachea midline. Lungs:  Clearing, Bilateral. Cardiac:  Irregular rhythm, normal S1 and S2, no S3. II/VI systolic murmur. Abdomen:  Soft, non-tender. BS present. Extremities:  1 + edema present. No cyanosis. No clubbing. CNS: AxOx0.  Skin: Warm and dry.   Intake/Output from previous day: No intake/output data recorded.    Lab Results: BMET    Component Value Date/Time   NA 137 07/31/2020 0651   NA 138 07/27/2020 0433   NA 140 07/26/2020 1544   K 3.9 07/31/2020 0651   K 3.7 07/27/2020 0433   K 4.2 07/26/2020 1544   CL 94 (L) 07/31/2020 0651   CL 96 (L) 07/27/2020 0433   CL 97 (L) 07/26/2020 1544   CO2 32 07/31/2020 0651   CO2 31 07/27/2020 0433   CO2 31 07/26/2020 1544   GLUCOSE 180 (H) 07/31/2020 0651   GLUCOSE 131 (H) 07/27/2020 0433   GLUCOSE 253 (H) 07/26/2020 1544   BUN 26 (H) 07/31/2020 0651   BUN 14 07/27/2020 0433   BUN 16 07/26/2020 1544   CREATININE <0.30 (L) 07/31/2020 0651   CREATININE <0.30 (L) 07/27/2020 0433   CREATININE 0.34 (L) 07/26/2020 1544   CALCIUM 9.7 07/31/2020 0651   CALCIUM 9.1 07/27/2020 0433   CALCIUM 9.3 07/26/2020 1544   GFRNONAA NOT CALCULATED 07/31/2020 0651   GFRNONAA NOT CALCULATED 07/27/2020 0433   GFRNONAA >60 07/26/2020 1544   CBC    Component Value Date/Time   WBC 7.4 07/31/2020 0651   RBC 3.91 (L) 07/31/2020 0651   HGB 11.6 (L) 07/31/2020 0651    HCT 34.7 (L) 07/31/2020 0651   PLT 118 (L) 07/31/2020 0651   MCV 88.7 07/31/2020 0651   MCH 29.7 07/31/2020 0651   MCHC 33.4 07/31/2020 0651   RDW 17.5 (H) 07/31/2020 0651   LYMPHSABS 2.3 06/06/2020 0418   MONOABS 0.9 06/06/2020 0418   EOSABS 0.0 06/06/2020 0418   BASOSABS 0.1 06/06/2020 0418   HEPATIC Function Panel Recent Labs    06/06/20 0418 07/19/20 0511 07/27/20 0433  PROT 6.8 4.8* 4.9*   HEMOGLOBIN A1C No components found for: HGA1C,  MPG CARDIAC ENZYMES Lab Results  Component Value Date   CKTOTAL 44 (L) 07/02/2020   BNP No results for input(s): PROBNP in the last 8760 hours. TSH Recent Labs    06/06/20 0418  TSH 3.257   CHOLESTEROL No results for input(s): CHOL in the last 8760 hours.  Scheduled Meds: Continuous Infusions: PRN Meds:.  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 Continue midodrine    LOS: 0 days   Time spent including chart review, lab review, examination, discussion with patient/Nurse/Physician : 30 min   Orpah Cobb  MD  07/31/2020, 9:58 PM

## 2020-08-01 ENCOUNTER — Other Ambulatory Visit (HOSPITAL_COMMUNITY): Payer: Medicare Other

## 2020-08-01 DIAGNOSIS — J9621 Acute and chronic respiratory failure with hypoxia: Secondary | ICD-10-CM | POA: Diagnosis not present

## 2020-08-01 DIAGNOSIS — G901 Familial dysautonomia [Riley-Day]: Secondary | ICD-10-CM | POA: Diagnosis not present

## 2020-08-01 DIAGNOSIS — I4891 Unspecified atrial fibrillation: Secondary | ICD-10-CM | POA: Diagnosis not present

## 2020-08-01 DIAGNOSIS — G610 Guillain-Barre syndrome: Secondary | ICD-10-CM | POA: Diagnosis not present

## 2020-08-01 LAB — BASIC METABOLIC PANEL
Anion gap: 10 (ref 5–15)
BUN: 20 mg/dL (ref 8–23)
CO2: 31 mmol/L (ref 22–32)
Calcium: 9.2 mg/dL (ref 8.9–10.3)
Chloride: 96 mmol/L — ABNORMAL LOW (ref 98–111)
Creatinine, Ser: <0.3 mg/dL — ABNORMAL LOW (ref 0.61–1.24)
Glucose, Bld: 162 mg/dL — ABNORMAL HIGH (ref 70–99)
Potassium: 4.2 mmol/L (ref 3.5–5.1)
Sodium: 137 mmol/L (ref 135–145)

## 2020-08-01 LAB — CBC
HCT: 33.7 % — ABNORMAL LOW (ref 39.0–52.0)
Hemoglobin: 11 g/dL — ABNORMAL LOW (ref 13.0–17.0)
MCH: 29.2 pg (ref 26.0–34.0)
MCHC: 32.6 g/dL (ref 30.0–36.0)
MCV: 89.4 fL (ref 80.0–100.0)
Platelets: 141 10*3/uL — ABNORMAL LOW (ref 150–400)
RBC: 3.77 MIL/uL — ABNORMAL LOW (ref 4.22–5.81)
RDW: 17.3 % — ABNORMAL HIGH (ref 11.5–15.5)
WBC: 7.6 10*3/uL (ref 4.0–10.5)
nRBC: 0 % (ref 0.0–0.2)

## 2020-08-01 NOTE — Consult Note (Signed)
Ref: Elana Alm, MD   Subjective:  HR down to 70's to 90's. Atrial fibrillation continues.  VS stable.  Objective:  Vital Signs in the last 24 hours: BP: 92/52, R: 21, O2 sat 100 % on 16 A/C and 40 % FiO2.  Physical Exam: BP Readings from Last 1 Encounters:  05/06/19 124/79     Wt Readings from Last 1 Encounters:  04/04/14 101.2 kg    Weight change:  There is no height or weight on file to calculate BMI. HEENT: [place]/AT, Eyes-Blue, Conjunctiva-Pale pink, Sclera-Non-icteric Neck: No JVD, No bruit, Trachea midline. Lungs:  Clearing, Bilateral. Cardiac:  Irregular rhythm, normal S1 and S2, no S3. II/VI systolic murmur. Abdomen:  Soft, non-tender. BS present. Extremities:  Trace edema present. No cyanosis. No clubbing. CNS: AxOx0,   Skin: Warm and dry.   Intake/Output from previous day: No intake/output data recorded.    Lab Results: BMET    Component Value Date/Time   NA 137 07/31/2020 0651   NA 138 07/27/2020 0433   NA 140 07/26/2020 1544   K 3.9 07/31/2020 0651   K 3.7 07/27/2020 0433   K 4.2 07/26/2020 1544   CL 94 (L) 07/31/2020 0651   CL 96 (L) 07/27/2020 0433   CL 97 (L) 07/26/2020 1544   CO2 32 07/31/2020 0651   CO2 31 07/27/2020 0433   CO2 31 07/26/2020 1544   GLUCOSE 180 (H) 07/31/2020 0651   GLUCOSE 131 (H) 07/27/2020 0433   GLUCOSE 253 (H) 07/26/2020 1544   BUN 26 (H) 07/31/2020 0651   BUN 14 07/27/2020 0433   BUN 16 07/26/2020 1544   CREATININE <0.30 (L) 07/31/2020 0651   CREATININE <0.30 (L) 07/27/2020 0433   CREATININE 0.34 (L) 07/26/2020 1544   CALCIUM 9.7 07/31/2020 0651   CALCIUM 9.1 07/27/2020 0433   CALCIUM 9.3 07/26/2020 1544   GFRNONAA NOT CALCULATED 07/31/2020 0651   GFRNONAA NOT CALCULATED 07/27/2020 0433   GFRNONAA >60 07/26/2020 1544   CBC    Component Value Date/Time   WBC 7.4 07/31/2020 0651   RBC 3.91 (L) 07/31/2020 0651   HGB 11.6 (L) 07/31/2020 0651   HCT 34.7 (L) 07/31/2020 0651   PLT 118 (L) 07/31/2020 0651   MCV  88.7 07/31/2020 0651   MCH 29.7 07/31/2020 0651   MCHC 33.4 07/31/2020 0651   RDW 17.5 (H) 07/31/2020 0651   LYMPHSABS 2.3 06/06/2020 0418   MONOABS 0.9 06/06/2020 0418   EOSABS 0.0 06/06/2020 0418   BASOSABS 0.1 06/06/2020 0418   HEPATIC Function Panel Recent Labs    06/06/20 0418 07/19/20 0511 07/27/20 0433  PROT 6.8 4.8* 4.9*   HEMOGLOBIN A1C No components found for: HGA1C,  MPG CARDIAC ENZYMES Lab Results  Component Value Date   CKTOTAL 44 (L) 07/02/2020   BNP No results for input(s): PROBNP in the last 8760 hours. TSH Recent Labs    06/06/20 0418  TSH 3.257   CHOLESTEROL No results for input(s): CHOL in the last 8760 hours.  Scheduled Meds: Continuous Infusions: PRN Meds:.  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   LOS: 0 days   Time spent including chart review, lab review, examination, discussion with patient/Nurse : 30 min   Orpah Cobb  MD  08/01/2020, 9:22 AM

## 2020-08-01 NOTE — Progress Notes (Signed)
Pulmonary Critical Care Medicine [facility] [place]   PULMONARY CRITICAL CARE SERVICE  PROGRESS NOTE  Date of Service: 08/01/2020  Brian Espinoza  DQQ:[id number]  DOB: [birth year]   DOA: 06/05/2020  Referring Physician: Carron Curie, MD  HPI: Brian Espinoza is a 65 y.o. male seen for follow up of Acute on Chronic Respiratory Failure.  Patient is on full support on assist control mode has been on 40% FiO2 with good saturation  Medications: Reviewed on Rounds  Physical Exam:  Vitals: Temperature is 97.0 pulse 96 respiratory 16 blood pressure is 113/81 saturations 100%  Ventilator Settings on assist control FiO2 40% tidal volume 400 PEEP 5  . General: Comfortable at this time . Eyes: Grossly normal lids, irises & conjunctiva . ENT: grossly tongue is normal . Neck: no obvious mass . Cardiovascular: S1 S2 normal no gallop . Respiratory: No rhonchi no rales . Abdomen: soft . Skin: no rash seen on limited exam . Musculoskeletal: not rigid . Psychiatric:unable to assess . Neurologic: no seizure no involuntary movements         Lab Data:   Basic Metabolic Panel: Recent Labs  Lab 07/26/20 1544 07/27/20 0433 07/31/20 0651  NA 140 138 137  K 4.2 3.7 3.9  CL 97* 96* 94*  CO2 31 31 32  GLUCOSE 253* 131* 180*  BUN 16 14 26*  CREATININE 0.34* <0.30* <0.30*  CALCIUM 9.3 9.1 9.7  MG  --   --  1.9    ABG: No results for input(s): PHART, PCO2ART, PO2ART, HCO3, O2SAT in the last 168 hours.  Liver Function Tests: Recent Labs  Lab 07/27/20 0433  AST 20  ALT 77*  ALKPHOS 49  BILITOT 0.6  PROT 4.9*  ALBUMIN 2.4*   No results for input(s): LIPASE, AMYLASE in the last 168 hours. No results for input(s): AMMONIA in the last 168 hours.  CBC: Recent Labs  Lab 07/26/20 1544 07/27/20 0433 07/31/20 0651  WBC 6.9 6.4 7.4  HGB 12.8* 10.9* 11.6*  HCT 38.6* 33.6* 34.7*  MCV 90.4 90.1 88.7  PLT 107* 110* 118*    Cardiac Enzymes: No results for input(s): CKTOTAL, CKMB,  CKMBINDEX, TROPONINI in the last 168 hours.  BNP (last 3 results) No results for input(s): BNP in the last 8760 hours.  ProBNP (last 3 results) No results for input(s): PROBNP in the last 8760 hours.  Radiological Exams: No results found.  Assessment/Plan Active Problems:   Acute on chronic respiratory failure with hypoxia (HCC)   Acute inflammatory demyelinating polyneuropathy (HCC)   Dysautonomia (HCC)   Atrial fibrillation with RVR (HCC)   Healthcare associated bacterial pneumonia   1. Acute on chronic respiratory failure hypoxia plan is to continue with the weaning as tolerated we will continue secretion management supportive care 2. Acute inflammatory demyelinating polyneuropathy no change we will continue to monitor 3. Dysautonomia supportive care 4. Atrial fibrillation rate controlled 5. Associated pneumonia treated   I have personally seen and evaluated the patient, evaluated laboratory and imaging results, formulated the assessment and plan and placed orders. The Patient requires high complexity decision making with multiple systems involvement.  Rounds were done with the Respiratory Therapy Director and Staff therapists and discussed with nursing staff also.  Yevonne Pax, MD [facility] Pulmonary Critical Care Medicine Sleep Medicine

## 2020-08-02 DIAGNOSIS — G901 Familial dysautonomia [Riley-Day]: Secondary | ICD-10-CM | POA: Diagnosis not present

## 2020-08-02 DIAGNOSIS — I4891 Unspecified atrial fibrillation: Secondary | ICD-10-CM | POA: Diagnosis not present

## 2020-08-02 DIAGNOSIS — G610 Guillain-Barre syndrome: Secondary | ICD-10-CM | POA: Diagnosis not present

## 2020-08-02 DIAGNOSIS — J9621 Acute and chronic respiratory failure with hypoxia: Secondary | ICD-10-CM | POA: Diagnosis not present

## 2020-08-02 LAB — URINALYSIS, ROUTINE W REFLEX MICROSCOPIC
Bilirubin Urine: NEGATIVE
Glucose, UA: NEGATIVE mg/dL
Ketones, ur: 5 mg/dL — AB
Nitrite: NEGATIVE
Protein, ur: 100 mg/dL — AB
RBC / HPF: >50 RBC/hpf — ABNORMAL HIGH (ref 0–5)
Specific Gravity, Urine: 1.03 (ref 1.005–1.030)
pH: 5 (ref 5.0–8.0)

## 2020-08-02 NOTE — Progress Notes (Signed)
Pulmonary Critical Care Medicine [facility] [place]   PULMONARY CRITICAL CARE SERVICE  PROGRESS NOTE  Date of Service: 08/02/2020  Brian Espinoza  VOZ:[id number]  DOB: Jan 31, 1956   DOA: 06/05/2020  Referring Physician: Carron Curie, MD  HPI: Brian Espinoza is a 65 y.o. male seen for follow up of Acute on Chronic Respiratory Failure.  Patient is comfortable right now without distress has been noted to have some increased heart rate cardiology continues to follow along  Medications: Reviewed on Rounds  Physical Exam:  Vitals: Temperature is 97.4 pulse 84 respiratory rate 17 blood pressure 93/61 saturations 100%  Ventilator Settings on pressure support wean attempt had increased heart rate now is on assist control FiO2 40% PEEP 5 tidal line 400  . General: Comfortable at this time . Eyes: Grossly normal lids, irises & conjunctiva . ENT: grossly tongue is normal . Neck: no obvious mass . Cardiovascular: S1 S2 normal no gallop . Respiratory: No rhonchi very coarse breath sounds . Abdomen: soft . Skin: no rash seen on limited exam . Musculoskeletal: not rigid . Psychiatric:unable to assess . Neurologic: no seizure no involuntary movements         Lab Data:   Basic Metabolic Panel: Recent Labs  Lab 07/26/20 1544 07/27/20 0433 07/31/20 0651 08/01/20 2210  NA 140 138 137 137  K 4.2 3.7 3.9 4.2  CL 97* 96* 94* 96*  CO2 31 31 32 31  GLUCOSE 253* 131* 180* 162*  BUN 16 14 26* 20  CREATININE 0.34* <0.30* <0.30* <0.30*  CALCIUM 9.3 9.1 9.7 9.2  MG  --   --  1.9  --     ABG: No results for input(s): PHART, PCO2ART, PO2ART, HCO3, O2SAT in the last 168 hours.  Liver Function Tests: Recent Labs  Lab 07/27/20 0433  AST 20  ALT 77*  ALKPHOS 49  BILITOT 0.6  PROT 4.9*  ALBUMIN 2.4*   No results for input(s): LIPASE, AMYLASE in the last 168 hours. No results for input(s): AMMONIA in the last 168 hours.  CBC: Recent Labs  Lab 07/26/20 1544 07/27/20 0433  07/31/20 0651 08/01/20 2210  WBC 6.9 6.4 7.4 7.6  HGB 12.8* 10.9* 11.6* 11.0*  HCT 38.6* 33.6* 34.7* 33.7*  MCV 90.4 90.1 88.7 89.4  PLT 107* 110* 118* 141*    Cardiac Enzymes: No results for input(s): CKTOTAL, CKMB, CKMBINDEX, TROPONINI in the last 168 hours.  BNP (last 3 results) No results for input(s): BNP in the last 8760 hours.  ProBNP (last 3 results) No results for input(s): PROBNP in the last 8760 hours.  Radiological Exams: DG CHEST PORT 1 VIEW  Result Date: 08/01/2020 CLINICAL DATA:  Pneumonia. EXAM: PORTABLE CHEST 1 VIEW COMPARISON:  Chest x-ray dated July 16, 2020. FINDINGS: Unchanged tracheostomy tube. Stable cardiomediastinal silhouette. Unchanged hazy opacities at both lung bases with obscuration of the diaphragm. No pneumothorax. No acute osseous abnormality. IMPRESSION: 1. Unchanged bibasilar opacities, presumably atelectasis. There may be small layering pleural effusions as well. Appearance is unchanged compared to the prior study from 2 weeks ago. Electronically Signed   By: Obie Dredge M.D.   On: 08/01/2020 19:09    Assessment/Plan Active Problems:   Acute on chronic respiratory failure with hypoxia (HCC)   Acute inflammatory demyelinating polyneuropathy (HCC)   Dysautonomia (HCC)   Atrial fibrillation with RVR (HCC)   Healthcare associated bacterial pneumonia   1. Acute on chronic respiratory failure hypoxia we will continue with assist control continue secretion management supportive care  2. Demyelinating polyneuropathy no change 3. Dysautonomia supportive care 4. Atrial fibrillation with RVR rate is controlled 5. Healthcare associated pneumonia treated slowly.   I have personally seen and evaluated the patient, evaluated laboratory and imaging results, formulated the assessment and plan and placed orders. The Patient requires high complexity decision making with multiple systems involvement.  Rounds were done with the Respiratory Therapy  Director and Staff therapists and discussed with nursing staff also.  Yevonne Pax, MD [facility] Pulmonary Critical Care Medicine Sleep Medicine

## 2020-08-03 DIAGNOSIS — G610 Guillain-Barre syndrome: Secondary | ICD-10-CM | POA: Diagnosis not present

## 2020-08-03 DIAGNOSIS — G901 Familial dysautonomia [Riley-Day]: Secondary | ICD-10-CM | POA: Diagnosis not present

## 2020-08-03 DIAGNOSIS — I4891 Unspecified atrial fibrillation: Secondary | ICD-10-CM | POA: Diagnosis not present

## 2020-08-03 DIAGNOSIS — J9621 Acute and chronic respiratory failure with hypoxia: Secondary | ICD-10-CM | POA: Diagnosis not present

## 2020-08-03 NOTE — Progress Notes (Signed)
Pulmonary Critical Care Medicine [facility] [place]   PULMONARY CRITICAL CARE SERVICE  PROGRESS NOTE  Date of Service: 08/03/2020  Cline Draheim  QQI:[id number]  DOB: 1956/02/05   DOA: 06/05/2020  Referring Physician: Carron Curie, MD  HPI: Jemario Poitras is a 65 y.o. male seen for follow up of Acute on Chronic Respiratory Failure.  Patient at this time is on T collar has been on 20% FiO2 good saturations are noted  Medications: Reviewed on Rounds  Physical Exam:  Vitals: Temperature is 98.6 pulse 82 respiratory 20 blood pressure is 106/64 saturations 100%  Ventilator Settings off the ventilator on T collar  . General: Comfortable at this time . Eyes: Grossly normal lids, irises & conjunctiva . ENT: grossly tongue is normal . Neck: no obvious mass . Cardiovascular: S1 S2 normal no gallop . Respiratory: No rhonchi without rales . Abdomen: soft . Skin: no rash seen on limited exam . Musculoskeletal: not rigid . Psychiatric:unable to assess . Neurologic: no seizure no involuntary movements         Lab Data:   Basic Metabolic Panel: Recent Labs  Lab 07/31/20 0651 08/01/20 2210  NA 137 137  K 3.9 4.2  CL 94* 96*  CO2 32 31  GLUCOSE 180* 162*  BUN 26* 20  CREATININE <0.30* <0.30*  CALCIUM 9.7 9.2  MG 1.9  --     ABG: No results for input(s): PHART, PCO2ART, PO2ART, HCO3, O2SAT in the last 168 hours.  Liver Function Tests: No results for input(s): AST, ALT, ALKPHOS, BILITOT, PROT, ALBUMIN in the last 168 hours. No results for input(s): LIPASE, AMYLASE in the last 168 hours. No results for input(s): AMMONIA in the last 168 hours.  CBC: Recent Labs  Lab 07/31/20 0651 08/01/20 2210  WBC 7.4 7.6  HGB 11.6* 11.0*  HCT 34.7* 33.7*  MCV 88.7 89.4  PLT 118* 141*    Cardiac Enzymes: No results for input(s): CKTOTAL, CKMB, CKMBINDEX, TROPONINI in the last 168 hours.  BNP (last 3 results) No results for input(s): BNP in the last 8760 hours.  ProBNP  (last 3 results) No results for input(s): PROBNP in the last 8760 hours.  Radiological Exams: DG CHEST PORT 1 VIEW  Result Date: 08/01/2020 CLINICAL DATA:  Pneumonia. EXAM: PORTABLE CHEST 1 VIEW COMPARISON:  Chest x-ray dated July 16, 2020. FINDINGS: Unchanged tracheostomy tube. Stable cardiomediastinal silhouette. Unchanged hazy opacities at both lung bases with obscuration of the diaphragm. No pneumothorax. No acute osseous abnormality. IMPRESSION: 1. Unchanged bibasilar opacities, presumably atelectasis. There may be small layering pleural effusions as well. Appearance is unchanged compared to the prior study from 2 weeks ago. Electronically Signed   By: Obie Dredge M.D.   On: 08/01/2020 19:09    Assessment/Plan Active Problems:   Acute on chronic respiratory failure with hypoxia (HCC)   Acute inflammatory demyelinating polyneuropathy (HCC)   Dysautonomia (HCC)   Atrial fibrillation with RVR (HCC)   Healthcare associated bacterial pneumonia   1. Acute on chronic respiratory failure hypoxia we will continue with the T-piece secretions are fairly moderate plan is to continue to advance as tolerated. 2. Acute inflammatory demyelinating neuropathy grossly unchanged 3. Dysautonomia heart rate has been waxing waning 4. Atrial fibrillation rate controlled 5. Healthcare associated pneumonia treated improving   I have personally seen and evaluated the patient, evaluated laboratory and imaging results, formulated the assessment and plan and placed orders. The Patient requires high complexity decision making with multiple systems involvement.  Rounds were done with  the Respiratory Therapy Director and Staff therapists and discussed with nursing staff also.  Allyne Gee, MD [facility] Pulmonary Critical Care Medicine Sleep Medicine

## 2020-08-04 DIAGNOSIS — G901 Familial dysautonomia [Riley-Day]: Secondary | ICD-10-CM | POA: Diagnosis not present

## 2020-08-04 DIAGNOSIS — J9621 Acute and chronic respiratory failure with hypoxia: Secondary | ICD-10-CM | POA: Diagnosis not present

## 2020-08-04 DIAGNOSIS — I4891 Unspecified atrial fibrillation: Secondary | ICD-10-CM | POA: Diagnosis not present

## 2020-08-04 DIAGNOSIS — G610 Guillain-Barre syndrome: Secondary | ICD-10-CM | POA: Diagnosis not present

## 2020-08-04 NOTE — Progress Notes (Signed)
Pulmonary Critical Care Medicine [facility] [place]   PULMONARY CRITICAL CARE SERVICE  PROGRESS NOTE  Date of Service: 08/04/2020  Brian Espinoza  ZOX:[id number]  DOB: 1956-04-21   DOA: 06/05/2020  Referring Physician: Carron Curie, MD  HPI: Brian Espinoza is a 65 y.o. male seen for follow up of Acute on Chronic Respiratory Failure.  Patient is currently on pressure support did yesterday 16 hours will be switched over to T collar today  Medications: Reviewed on Rounds  Physical Exam:  Vitals: Temperature 98.1 pulse 120 respiratory rate 23 blood pressure is 130/88 saturations 99%  Ventilator Settings on pressure support 12/5  . General: Comfortable at this time . Eyes: Grossly normal lids, irises & conjunctiva . ENT: grossly tongue is normal . Neck: no obvious mass . Cardiovascular: S1 S2 normal no gallop . Respiratory: Scattered rhonchi expansion is equal . Abdomen: soft . Skin: no rash seen on limited exam . Musculoskeletal: not rigid . Psychiatric:unable to assess . Neurologic: no seizure no involuntary movements         Lab Data:   Basic Metabolic Panel: Recent Labs  Lab 07/31/20 0651 08/01/20 2210  NA 137 137  K 3.9 4.2  CL 94* 96*  CO2 32 31  GLUCOSE 180* 162*  BUN 26* 20  CREATININE <0.30* <0.30*  CALCIUM 9.7 9.2  MG 1.9  --     ABG: No results for input(s): PHART, PCO2ART, PO2ART, HCO3, O2SAT in the last 168 hours.  Liver Function Tests: No results for input(s): AST, ALT, ALKPHOS, BILITOT, PROT, ALBUMIN in the last 168 hours. No results for input(s): LIPASE, AMYLASE in the last 168 hours. No results for input(s): AMMONIA in the last 168 hours.  CBC: Recent Labs  Lab 07/31/20 0651 08/01/20 2210  WBC 7.4 7.6  HGB 11.6* 11.0*  HCT 34.7* 33.7*  MCV 88.7 89.4  PLT 118* 141*    Cardiac Enzymes: No results for input(s): CKTOTAL, CKMB, CKMBINDEX, TROPONINI in the last 168 hours.  BNP (last 3 results) No results for input(s): BNP in the  last 8760 hours.  ProBNP (last 3 results) No results for input(s): PROBNP in the last 8760 hours.  Radiological Exams: No results found.  Assessment/Plan Active Problems:   Acute on chronic respiratory failure with hypoxia (HCC)   Acute inflammatory demyelinating polyneuropathy (HCC)   Dysautonomia (HCC)   Atrial fibrillation with RVR (HCC)   Healthcare associated bacterial pneumonia   1. Acute on chronic respiratory failure with hypoxia we will continue with weaning on T collar trials titrate oxygen continue pulmonary toilet. 2. Acute inflammatory demyelinating polyneuropathy no change we will continue to follow along. 3. Dysautonomia no changes 4. Atrial fibrillation rate is controlled 5. After associated pneumonia has been treated slow improvement   I have personally seen and evaluated the patient, evaluated laboratory and imaging results, formulated the assessment and plan and placed orders. The Patient requires high complexity decision making with multiple systems involvement.  Rounds were done with the Respiratory Therapy Director and Staff therapists and discussed with nursing staff also.  Yevonne Pax, MD [facility] Pulmonary Critical Care Medicine Sleep Medicine

## 2020-08-05 DIAGNOSIS — I4891 Unspecified atrial fibrillation: Secondary | ICD-10-CM | POA: Diagnosis not present

## 2020-08-05 DIAGNOSIS — G901 Familial dysautonomia [Riley-Day]: Secondary | ICD-10-CM | POA: Diagnosis not present

## 2020-08-05 DIAGNOSIS — J9621 Acute and chronic respiratory failure with hypoxia: Secondary | ICD-10-CM | POA: Diagnosis not present

## 2020-08-05 DIAGNOSIS — G610 Guillain-Barre syndrome: Secondary | ICD-10-CM | POA: Diagnosis not present

## 2020-08-05 LAB — BASIC METABOLIC PANEL
Anion gap: 6 (ref 5–15)
BUN: 27 mg/dL — ABNORMAL HIGH (ref 8–23)
CO2: 32 mmol/L (ref 22–32)
Calcium: 9.1 mg/dL (ref 8.9–10.3)
Chloride: 101 mmol/L (ref 98–111)
Creatinine, Ser: <0.3 mg/dL — ABNORMAL LOW (ref 0.61–1.24)
Glucose, Bld: 160 mg/dL — ABNORMAL HIGH (ref 70–99)
Potassium: 3.5 mmol/L (ref 3.5–5.1)
Sodium: 139 mmol/L (ref 135–145)

## 2020-08-05 LAB — MAGNESIUM: Magnesium: 1.9 mg/dL (ref 1.7–2.4)

## 2020-08-05 NOTE — Progress Notes (Signed)
Pulmonary Critical Care Medicine [facility] [place]   PULMONARY CRITICAL CARE SERVICE  PROGRESS NOTE  Date of Service: 08/05/2020  Brian Espinoza  ZJQ:[id number]  DOB: [birth year]   DOA: 06/05/2020  Referring Physician: Carron Curie, MD  HPI: Brian Espinoza is a 65 y.o. male seen for follow up of Acute on Chronic Respiratory Failure.  Patient is on T collar currently on 40% FiO2 the goal was to try to do up to 4 hours  Medications: Reviewed on Rounds  Physical Exam:  Vitals: Temperature is 97.2 pulse 120 respiratory 29 blood pressure is 162/80 saturations 100%  Ventilator Settings on T collar FiO2 40%  . General: Comfortable at this time . Eyes: Grossly normal lids, irises & conjunctiva . ENT: grossly tongue is normal . Neck: no obvious mass . Cardiovascular: S1 S2 normal no gallop . Respiratory: Scattered rhonchi expansion is equal . Abdomen: soft . Skin: no rash seen on limited exam . Musculoskeletal: not rigid . Psychiatric:unable to assess . Neurologic: no seizure no involuntary movements         Lab Data:   Basic Metabolic Panel: Recent Labs  Lab 07/31/20 0651 08/01/20 2210 08/05/20 0401  NA 137 137 139  K 3.9 4.2 3.5  CL 94* 96* 101  CO2 32 31 32  GLUCOSE 180* 162* 160*  BUN 26* 20 27*  CREATININE <0.30* <0.30* <0.30*  CALCIUM 9.7 9.2 9.1  MG 1.9  --  1.9    ABG: No results for input(s): PHART, PCO2ART, PO2ART, HCO3, O2SAT in the last 168 hours.  Liver Function Tests: No results for input(s): AST, ALT, ALKPHOS, BILITOT, PROT, ALBUMIN in the last 168 hours. No results for input(s): LIPASE, AMYLASE in the last 168 hours. No results for input(s): AMMONIA in the last 168 hours.  CBC: Recent Labs  Lab 07/31/20 0651 08/01/20 2210  WBC 7.4 7.6  HGB 11.6* 11.0*  HCT 34.7* 33.7*  MCV 88.7 89.4  PLT 118* 141*    Cardiac Enzymes: No results for input(s): CKTOTAL, CKMB, CKMBINDEX, TROPONINI in the last 168 hours.  BNP (last 3 results) No  results for input(s): BNP in the last 8760 hours.  ProBNP (last 3 results) No results for input(s): PROBNP in the last 8760 hours.  Radiological Exams: No results found.  Assessment/Plan Active Problems:   Acute on chronic respiratory failure with hypoxia (HCC)   Acute inflammatory demyelinating polyneuropathy (HCC)   Dysautonomia (HCC)   Atrial fibrillation with RVR (HCC)   Healthcare associated bacterial pneumonia   1. Acute on chronic respiratory failure hypoxia we will continue with the T collar wean patient remains on 40% FiO2. 2. Acute inflammatory demyelinating polyneuropathy no change we will continue to monitor 3. Dysautonomia no change we will continue to monitor rate is controlled 4. Atrial fibrillation rate is controlled 5. Healthcare associated pneumonia slow improvement   I have personally seen and evaluated the patient, evaluated laboratory and imaging results, formulated the assessment and plan and placed orders. The Patient requires high complexity decision making with multiple systems involvement.  Rounds were done with the Respiratory Therapy Director and Staff therapists and discussed with nursing staff also.  Yevonne Pax, MD [facility] Pulmonary Critical Care Medicine Sleep Medicine

## 2020-08-07 DIAGNOSIS — G610 Guillain-Barre syndrome: Secondary | ICD-10-CM | POA: Diagnosis not present

## 2020-08-07 DIAGNOSIS — G901 Familial dysautonomia [Riley-Day]: Secondary | ICD-10-CM | POA: Diagnosis not present

## 2020-08-07 DIAGNOSIS — I4891 Unspecified atrial fibrillation: Secondary | ICD-10-CM | POA: Diagnosis not present

## 2020-08-07 DIAGNOSIS — J9621 Acute and chronic respiratory failure with hypoxia: Secondary | ICD-10-CM | POA: Diagnosis not present

## 2020-08-07 NOTE — Progress Notes (Signed)
Pulmonary Critical Care Medicine [facility] [place]   PULMONARY CRITICAL CARE SERVICE  PROGRESS NOTE  Date of Service: 08/07/2020  Brian Espinoza  [id number]  DOB: [birth year]   DOA: 06/05/2020  Referring Physician: Carron Curie, MD  HPI: Brian Espinoza is a 65 y.o. male seen for follow up of Acute on Chronic Respiratory Failure.  Patient remains on pressure support has been on pressure 12/5  Medications: Reviewed on Rounds  Physical Exam:  Vitals: Temperature is 96.7 pulse 126 respiratory 20 blood pressure is 116/59 saturations 96%  Ventilator Settings on pressure support FiO2 35% with a pressure of 12/5  . General: Comfortable at this time . Eyes: Grossly normal lids, irises & conjunctiva . ENT: grossly tongue is normal . Neck: no obvious mass . Cardiovascular: S1 S2 normal no gallop . Respiratory: Scattered rhonchi expansion is equal . Abdomen: soft . Skin: no rash seen on limited exam . Musculoskeletal: not rigid . Psychiatric:unable to assess . Neurologic: no seizure no involuntary movements         Lab Data:   Basic Metabolic Panel: Recent Labs  Lab 08/01/20 2210 08/05/20 0401  NA 137 139  K 4.2 3.5  CL 96* 101  CO2 31 32  GLUCOSE 162* 160*  BUN 20 27*  CREATININE <0.30* <0.30*  CALCIUM 9.2 9.1  MG  --  1.9    ABG: No results for input(s): PHART, PCO2ART, PO2ART, HCO3, O2SAT in the last 168 hours.  Liver Function Tests: No results for input(s): AST, ALT, ALKPHOS, BILITOT, PROT, ALBUMIN in the last 168 hours. No results for input(s): LIPASE, AMYLASE in the last 168 hours. No results for input(s): AMMONIA in the last 168 hours.  CBC: Recent Labs  Lab 08/01/20 2210  WBC 7.6  HGB 11.0*  HCT 33.7*  MCV 89.4  PLT 141*    Cardiac Enzymes: No results for input(s): CKTOTAL, CKMB, CKMBINDEX, TROPONINI in the last 168 hours.  BNP (last 3 results) No results for input(s): BNP in the last 8760 hours.  ProBNP (last 3 results) No results for  input(s): PROBNP in the last 8760 hours.  Radiological Exams: No results found.  Assessment/Plan Active Problems:   Acute on chronic respiratory failure with hypoxia (HCC)   Acute inflammatory demyelinating polyneuropathy (HCC)   Dysautonomia (HCC)   Atrial fibrillation with RVR (HCC)   Healthcare associated bacterial pneumonia   1. Acute on chronic respiratory failure hypoxia plan is to continue with the weaning process advanced to T collar. 2. Acute demyelinating polyneuropathy no change we will continue supportive care 3. Dysautonomia patient is at baseline 4. Atrial fibrillation rate controlled 5. Healthcare associated pneumonia no change   I have personally seen and evaluated the patient, evaluated laboratory and imaging results, formulated the assessment and plan and placed orders. The Patient requires high complexity decision making with multiple systems involvement.  Rounds were done with the Respiratory Therapy Director and Staff therapists and discussed with nursing staff also.  Yevonne Pax, MD [facility] Pulmonary Critical Care Medicine Sleep Medicine

## 2020-08-08 DIAGNOSIS — G610 Guillain-Barre syndrome: Secondary | ICD-10-CM | POA: Diagnosis not present

## 2020-08-08 DIAGNOSIS — G901 Familial dysautonomia [Riley-Day]: Secondary | ICD-10-CM | POA: Diagnosis not present

## 2020-08-08 DIAGNOSIS — J9621 Acute and chronic respiratory failure with hypoxia: Secondary | ICD-10-CM | POA: Diagnosis not present

## 2020-08-08 DIAGNOSIS — I4891 Unspecified atrial fibrillation: Secondary | ICD-10-CM | POA: Diagnosis not present

## 2020-08-08 LAB — CBC
HCT: 29.5 % — ABNORMAL LOW (ref 39.0–52.0)
Hemoglobin: 9.4 g/dL — ABNORMAL LOW (ref 13.0–17.0)
MCH: 29.3 pg (ref 26.0–34.0)
MCHC: 31.9 g/dL (ref 30.0–36.0)
MCV: 91.9 fL (ref 80.0–100.0)
Platelets: 189 10*3/uL (ref 150–400)
RBC: 3.21 MIL/uL — ABNORMAL LOW (ref 4.22–5.81)
RDW: 18.6 % — ABNORMAL HIGH (ref 11.5–15.5)
WBC: 7.8 10*3/uL (ref 4.0–10.5)
nRBC: 0.3 % — ABNORMAL HIGH (ref 0.0–0.2)

## 2020-08-08 LAB — BASIC METABOLIC PANEL
Anion gap: 3 — ABNORMAL LOW (ref 5–15)
BUN: 29 mg/dL — ABNORMAL HIGH (ref 8–23)
CO2: 34 mmol/L — ABNORMAL HIGH (ref 22–32)
Calcium: 8.6 mg/dL — ABNORMAL LOW (ref 8.9–10.3)
Chloride: 102 mmol/L (ref 98–111)
Creatinine, Ser: <0.3 mg/dL — ABNORMAL LOW (ref 0.61–1.24)
Glucose, Bld: 151 mg/dL — ABNORMAL HIGH (ref 70–99)
Potassium: 3.5 mmol/L (ref 3.5–5.1)
Sodium: 139 mmol/L (ref 135–145)

## 2020-08-08 LAB — DIGOXIN LEVEL: Digoxin Level: 0.4 ng/mL — ABNORMAL LOW (ref 0.8–2.0)

## 2020-08-08 NOTE — Progress Notes (Signed)
Pulmonary Critical Care Medicine [facility] [place]   PULMONARY CRITICAL CARE SERVICE  PROGRESS NOTE  Date of Service: 08/08/2020  Brian Espinoza  OIN:[id number]  DOB: 05/13/1956   DOA: 06/05/2020  Referring Physician: Carron Curie, MD  HPI: Brian Espinoza is a 65 y.o. male seen for follow up of Acute on Chronic Respiratory Failure.  Patient currently is on full support on assist control mode currently on 40% FiO2  Medications: Reviewed on Rounds  Physical Exam:  Vitals: Temperature is 96.4 pulse 58 respiratory 17 blood pressure is 117/66 saturations 100%  Ventilator Settings on assist control FiO2 40%  . General: Comfortable at this time . Eyes: Grossly normal lids, irises & conjunctiva . ENT: grossly tongue is normal . Neck: no obvious mass . Cardiovascular: S1 S2 normal no gallop . Respiratory: No rhonchi very coarse breath . Abdomen: soft . Skin: no rash seen on limited exam . Musculoskeletal: not rigid . Psychiatric:unable to assess . Neurologic: no seizure no involuntary movements         Lab Data:   Basic Metabolic Panel: Recent Labs  Lab 08/01/20 2210 08/05/20 0401 08/08/20 0412  NA 137 139 139  K 4.2 3.5 3.5  CL 96* 101 102  CO2 31 32 34*  GLUCOSE 162* 160* 151*  BUN 20 27* 29*  CREATININE <0.30* <0.30* <0.30*  CALCIUM 9.2 9.1 8.6*  MG  --  1.9  --     ABG: No results for input(s): PHART, PCO2ART, PO2ART, HCO3, O2SAT in the last 168 hours.  Liver Function Tests: No results for input(s): AST, ALT, ALKPHOS, BILITOT, PROT, ALBUMIN in the last 168 hours. No results for input(s): LIPASE, AMYLASE in the last 168 hours. No results for input(s): AMMONIA in the last 168 hours.  CBC: Recent Labs  Lab 08/01/20 2210 08/08/20 0412  WBC 7.6 7.8  HGB 11.0* 9.4*  HCT 33.7* 29.5*  MCV 89.4 91.9  PLT 141* 189    Cardiac Enzymes: No results for input(s): CKTOTAL, CKMB, CKMBINDEX, TROPONINI in the last 168 hours.  BNP (last 3 results) No results  for input(s): BNP in the last 8760 hours.  ProBNP (last 3 results) No results for input(s): PROBNP in the last 8760 hours.  Radiological Exams: No results found.  Assessment/Plan Active Problems:   Acute on chronic respiratory failure with hypoxia (HCC)   Acute inflammatory demyelinating polyneuropathy (HCC)   Dysautonomia (HCC)   Atrial fibrillation with RVR (HCC)   Healthcare associated bacterial pneumonia   1. Acute on chronic respiratory failure with hypoxia plan is to continue with assist control titrate oxygen continue pulmonary toilet 2. Acute inflammatory demyelinating polyneuropathy no change we will continue with supportive care 3. Dysautonomia patient is at baseline 4. Atrial fibrillation rate controlled 5. Healthcare associated pneumonia has been treated slow improved   I have personally seen and evaluated the patient, evaluated laboratory and imaging results, formulated the assessment and plan and placed orders. The Patient requires high complexity decision making with multiple systems involvement.  Rounds were done with the Respiratory Therapy Director and Staff therapists and discussed with nursing staff also.  Yevonne Pax, MD [facility] Pulmonary Critical Care Medicine Sleep Medicine

## 2020-08-09 DIAGNOSIS — I4891 Unspecified atrial fibrillation: Secondary | ICD-10-CM | POA: Diagnosis not present

## 2020-08-09 DIAGNOSIS — G610 Guillain-Barre syndrome: Secondary | ICD-10-CM | POA: Diagnosis not present

## 2020-08-09 DIAGNOSIS — J9621 Acute and chronic respiratory failure with hypoxia: Secondary | ICD-10-CM | POA: Diagnosis not present

## 2020-08-09 DIAGNOSIS — G901 Familial dysautonomia [Riley-Day]: Secondary | ICD-10-CM | POA: Diagnosis not present

## 2020-08-09 NOTE — Progress Notes (Signed)
Pulmonary Critical Care Medicine [facility] [place]   PULMONARY CRITICAL CARE SERVICE  PROGRESS NOTE  Date of Service: 08/09/2020  Brian Espinoza  BTD:[id number]  DOB: 1955-12-07   DOA: 06/05/2020  Referring Physician: Carron Curie, MD  HPI: Brian Espinoza is a 65 y.o. male seen for follow up of Acute on Chronic Respiratory Failure.  Patient had some episodes of bradycardia while he was on full support on the ventilator has been on assist control mode.  Heart rate had reportedly gone down into the 50s  Medications: Reviewed on Rounds  Physical Exam:  Vitals: Temperature 96.2 pulse 66 respiratory rate was 16 blood pressure 108/67 saturations 100%  Ventilator Settings on assist control FiO2 is 40% tidal volume 400 PEEP 5  . General: Comfortable at this time . Eyes: Grossly normal lids, irises & conjunctiva . ENT: grossly tongue is normal . Neck: no obvious mass . Cardiovascular: S1 S2 normal no gallop . Respiratory: No rhonchi very coarse breath sounds . Abdomen: soft . Skin: no rash seen on limited exam . Musculoskeletal: not rigid . Psychiatric:unable to assess . Neurologic: no seizure no involuntary movements         Lab Data:   Basic Metabolic Panel: Recent Labs  Lab 08/05/20 0401 08/08/20 0412  NA 139 139  K 3.5 3.5  CL 101 102  CO2 32 34*  GLUCOSE 160* 151*  BUN 27* 29*  CREATININE <0.30* <0.30*  CALCIUM 9.1 8.6*  MG 1.9  --     ABG: No results for input(s): PHART, PCO2ART, PO2ART, HCO3, O2SAT in the last 168 hours.  Liver Function Tests: No results for input(s): AST, ALT, ALKPHOS, BILITOT, PROT, ALBUMIN in the last 168 hours. No results for input(s): LIPASE, AMYLASE in the last 168 hours. No results for input(s): AMMONIA in the last 168 hours.  CBC: Recent Labs  Lab 08/08/20 0412  WBC 7.8  HGB 9.4*  HCT 29.5*  MCV 91.9  PLT 189    Cardiac Enzymes: No results for input(s): CKTOTAL, CKMB, CKMBINDEX, TROPONINI in the last 168 hours.  BNP  (last 3 results) No results for input(s): BNP in the last 8760 hours.  ProBNP (last 3 results) No results for input(s): PROBNP in the last 8760 hours.  Radiological Exams: No results found.  Assessment/Plan Active Problems:   Acute on chronic respiratory failure with hypoxia (HCC)   Acute inflammatory demyelinating polyneuropathy (HCC)   Dysautonomia (HCC)   Atrial fibrillation with RVR (HCC)   Healthcare associated bacterial pneumonia   1. Acute on chronic respiratory failure hypoxia plan is to continue with full support on the ventilator.  Patient has been having ongoing autonomic instability. 2. Acute inflammatory demyelinating polyneuropathy no change we will continue to follow along. 3. Dysautonomia no change we will continue with supportive care 4. Atrial fibrillation rate is controlled 5. Healthcare associated pneumonia has been treated with antibiotics   I have personally seen and evaluated the patient, evaluated laboratory and imaging results, formulated the assessment and plan and placed orders. The Patient requires high complexity decision making with multiple systems involvement.  Rounds were done with the Respiratory Therapy Director and Staff therapists and discussed with nursing staff also.  Yevonne Pax, MD [facility] Pulmonary Critical Care Medicine Sleep Medicine

## 2020-08-10 DIAGNOSIS — G901 Familial dysautonomia [Riley-Day]: Secondary | ICD-10-CM | POA: Diagnosis not present

## 2020-08-10 DIAGNOSIS — J9621 Acute and chronic respiratory failure with hypoxia: Secondary | ICD-10-CM | POA: Diagnosis not present

## 2020-08-10 DIAGNOSIS — I4891 Unspecified atrial fibrillation: Secondary | ICD-10-CM | POA: Diagnosis not present

## 2020-08-10 DIAGNOSIS — G610 Guillain-Barre syndrome: Secondary | ICD-10-CM | POA: Diagnosis not present

## 2020-08-10 NOTE — Progress Notes (Signed)
Pulmonary Critical Care Medicine [facility] [place]   PULMONARY CRITICAL CARE SERVICE  PROGRESS NOTE  Date of Service: 08/10/2020  Brian Espinoza  FGH:[id number]  DOB: 1956-01-15   DOA: 06/05/2020  Referring Physician: Carron Curie, MD  HPI: Brian Espinoza is a 65 y.o. male seen for follow up of Acute on Chronic Respiratory Failure.  Patient is on assist control mode currently on 40% FiO2 good saturations are noted  Medications: Reviewed on Rounds  Physical Exam:  Vitals: Temperature is 96.0 pulse 57 respiratory rate 17 blood pressure is 102/58 saturations 100%  Ventilator Settings on assist control FiO2 is 40% tidal volume 400 PEEP 5  . General: Comfortable at this time . Eyes: Grossly normal lids, irises & conjunctiva . ENT: grossly tongue is normal . Neck: no obvious mass . Cardiovascular: S1 S2 normal no gallop . Respiratory: Scattered rhonchi expansion is equal . Abdomen: soft . Skin: no rash seen on limited exam . Musculoskeletal: not rigid . Psychiatric:unable to assess . Neurologic: no seizure no involuntary movements         Lab Data:   Basic Metabolic Panel: Recent Labs  Lab 08/05/20 0401 08/08/20 0412  NA 139 139  K 3.5 3.5  CL 101 102  CO2 32 34*  GLUCOSE 160* 151*  BUN 27* 29*  CREATININE <0.30* <0.30*  CALCIUM 9.1 8.6*  MG 1.9  --     ABG: No results for input(s): PHART, PCO2ART, PO2ART, HCO3, O2SAT in the last 168 hours.  Liver Function Tests: No results for input(s): AST, ALT, ALKPHOS, BILITOT, PROT, ALBUMIN in the last 168 hours. No results for input(s): LIPASE, AMYLASE in the last 168 hours. No results for input(s): AMMONIA in the last 168 hours.  CBC: Recent Labs  Lab 08/08/20 0412  WBC 7.8  HGB 9.4*  HCT 29.5*  MCV 91.9  PLT 189    Cardiac Enzymes: No results for input(s): CKTOTAL, CKMB, CKMBINDEX, TROPONINI in the last 168 hours.  BNP (last 3 results) No results for input(s): BNP in the last 8760 hours.  ProBNP  (last 3 results) No results for input(s): PROBNP in the last 8760 hours.  Radiological Exams: No results found.  Assessment/Plan Active Problems:   Acute on chronic respiratory failure with hypoxia (HCC)   Acute inflammatory demyelinating polyneuropathy (HCC)   Dysautonomia (HCC)   Atrial fibrillation with RVR (HCC)   Healthcare associated bacterial pneumonia   1. Acute on chronic respiratory failure hypoxia patient's been having episodes of bradycardia again spoke with the team during rounds need to stop his medications that were started 2. Dysautonomia we will continue with supportive care follow-up with cardiology 3. A acute inflammatory demyelinating polyneuropathy no change 4. Atrial fibrillation with RVR we will continue with supportive care 5. Healthcare associated pneumonia treated   I have personally seen and evaluated the patient, evaluated laboratory and imaging results, formulated the assessment and plan and placed orders. The Patient requires high complexity decision making with multiple systems involvement.  Rounds were done with the Respiratory Therapy Director and Staff therapists and discussed with nursing staff also.  Yevonne Pax, MD [facility] Pulmonary Critical Care Medicine Sleep Medicine

## 2020-08-10 NOTE — Consult Note (Signed)
Ref: Elana Alm, MD   Subjective:  Episodes of hypotension and bradycardia requiring atropine use last night x 2. Awake. HR in 40-50/min. Monitor shows sinus rhythm.  Objective:  Vital Signs in the last 24 hours: P: 51, R: 17, BP: 91/49, O2 sat 100 % on 40% FiO2 and 16 A/C.   Physical Exam: BP Readings from Last 1 Encounters:  05/06/19 124/79     Wt Readings from Last 1 Encounters:  04/04/14 101.2 kg    Weight change:  There is no height or weight on file to calculate BMI. HEENT: [place]/AT, Eyes-Blue, Conjunctiva-Pale pink, Sclera-Non-icteric Neck: No JVD, No bruit, Trachea midline. Lungs:  Clearing, Bilateral. Cardiac:  Regular rhythm, normal S1 and S2, no S3. II/VI systolic murmur. Abdomen:  Soft, non-tender. BS present. Extremities:  Trace edema present. No cyanosis. No clubbing. CNS: AxOx0.  Skin: Warm and dry.   Intake/Output from previous day: No intake/output data recorded.    Lab Results: BMET    Component Value Date/Time   NA 139 08/08/2020 0412   NA 139 08/05/2020 0401   NA 137 08/01/2020 2210   K 3.5 08/08/2020 0412   K 3.5 08/05/2020 0401   K 4.2 08/01/2020 2210   CL 102 08/08/2020 0412   CL 101 08/05/2020 0401   CL 96 (L) 08/01/2020 2210   CO2 34 (H) 08/08/2020 0412   CO2 32 08/05/2020 0401   CO2 31 08/01/2020 2210   GLUCOSE 151 (H) 08/08/2020 0412   GLUCOSE 160 (H) 08/05/2020 0401   GLUCOSE 162 (H) 08/01/2020 2210   BUN 29 (H) 08/08/2020 0412   BUN 27 (H) 08/05/2020 0401   BUN 20 08/01/2020 2210   CREATININE <0.30 (L) 08/08/2020 0412   CREATININE <0.30 (L) 08/05/2020 0401   CREATININE <0.30 (L) 08/01/2020 2210   CALCIUM 8.6 (L) 08/08/2020 0412   CALCIUM 9.1 08/05/2020 0401   CALCIUM 9.2 08/01/2020 2210   GFRNONAA NOT CALCULATED 08/08/2020 0412   GFRNONAA NOT CALCULATED 08/05/2020 0401   GFRNONAA NOT CALCULATED 08/01/2020 2210   CBC    Component Value Date/Time   WBC 7.8 08/08/2020 0412   RBC 3.21 (L) 08/08/2020 0412   HGB 9.4 (L)  08/08/2020 0412   HCT 29.5 (L) 08/08/2020 0412   PLT 189 08/08/2020 0412   MCV 91.9 08/08/2020 0412   MCH 29.3 08/08/2020 0412   MCHC 31.9 08/08/2020 0412   RDW 18.6 (H) 08/08/2020 0412   LYMPHSABS 2.3 06/06/2020 0418   MONOABS 0.9 06/06/2020 0418   EOSABS 0.0 06/06/2020 0418   BASOSABS 0.1 06/06/2020 0418   HEPATIC Function Panel Recent Labs    06/06/20 0418 07/19/20 0511 07/27/20 0433  PROT 6.8 4.8* 4.9*   HEMOGLOBIN A1C No components found for: HGA1C,  MPG CARDIAC ENZYMES Lab Results  Component Value Date   CKTOTAL 44 (L) 07/02/2020   BNP No results for input(s): PROBNP in the last 8760 hours. TSH Recent Labs    06/06/20 0418  TSH 3.257   CHOLESTEROL No results for input(s): CHOL in the last 8760 hours.  Scheduled Meds: Continuous Infusions: PRN Meds:.  Assessment/Plan: Acute on chronic respiratory failure with hypoxia Atrial fibrillation, now converted to sinus rhythm CAD HCM without obstruction S/P Pneumonia S/P demyelinating polyneuropathy S/P tracheostomy S/P PEG placement  Hold amiodarone and metoprolol.   LOS: 0 days   Time spent including chart review, lab review, examination, discussion with patient/Nurse : 30 min   Orpah Cobb  MD  08/10/2020, 10:03 AM

## 2020-08-11 DIAGNOSIS — G610 Guillain-Barre syndrome: Secondary | ICD-10-CM | POA: Diagnosis not present

## 2020-08-11 DIAGNOSIS — J9621 Acute and chronic respiratory failure with hypoxia: Secondary | ICD-10-CM | POA: Diagnosis not present

## 2020-08-11 DIAGNOSIS — I4891 Unspecified atrial fibrillation: Secondary | ICD-10-CM | POA: Diagnosis not present

## 2020-08-11 DIAGNOSIS — G901 Familial dysautonomia [Riley-Day]: Secondary | ICD-10-CM | POA: Diagnosis not present

## 2020-08-11 LAB — CBC
HCT: 25.8 % — ABNORMAL LOW (ref 39.0–52.0)
Hemoglobin: 8.6 g/dL — ABNORMAL LOW (ref 13.0–17.0)
MCH: 30 pg (ref 26.0–34.0)
MCHC: 33.3 g/dL (ref 30.0–36.0)
MCV: 89.9 fL (ref 80.0–100.0)
Platelets: 182 10*3/uL (ref 150–400)
RBC: 2.87 MIL/uL — ABNORMAL LOW (ref 4.22–5.81)
RDW: 18.2 % — ABNORMAL HIGH (ref 11.5–15.5)
WBC: 7.2 10*3/uL (ref 4.0–10.5)
nRBC: 0.6 % — ABNORMAL HIGH (ref 0.0–0.2)

## 2020-08-11 LAB — BASIC METABOLIC PANEL
Anion gap: 6 (ref 5–15)
BUN: 24 mg/dL — ABNORMAL HIGH (ref 8–23)
CO2: 34 mmol/L — ABNORMAL HIGH (ref 22–32)
Calcium: 9 mg/dL (ref 8.9–10.3)
Chloride: 99 mmol/L (ref 98–111)
Creatinine, Ser: <0.3 mg/dL — ABNORMAL LOW (ref 0.61–1.24)
Glucose, Bld: 157 mg/dL — ABNORMAL HIGH (ref 70–99)
Potassium: 3.9 mmol/L (ref 3.5–5.1)
Sodium: 139 mmol/L (ref 135–145)

## 2020-08-11 NOTE — Progress Notes (Signed)
Pulmonary Critical Care Medicine [facility] [place]   PULMONARY CRITICAL CARE SERVICE  PROGRESS NOTE  Date of Service: 08/11/2020  Brian Espinoza  YIR:[id number]  DOB: 1955-09-11   DOA: 06/05/2020  Referring Physician: Carron Curie, MD  HPI: Brian Espinoza is a 65 y.o. male seen for follow up of Acute on Chronic Respiratory Failure.  Patient is on the ventilator.  Medications: Reviewed on Rounds  Physical Exam:  Vitals: Temperature is 96.5 pulse 51 respiratory rate 17 blood pressure is 112/61 saturations 100%  Ventilator Settings patient is on the ventilator and assist control FiO2 40% tidal volume 400 PEEP 5  . General: Comfortable at this time . Eyes: Grossly normal lids, irises & conjunctiva . ENT: grossly tongue is normal . Neck: no obvious mass . Cardiovascular: S1 S2 normal no gallop . Respiratory: No rhonchi no rales are noted at this time . Abdomen: soft . Skin: no rash seen on limited exam . Musculoskeletal: not rigid . Psychiatric:unable to assess . Neurologic: no seizure no involuntary movements         Lab Data:   Basic Metabolic Panel: Recent Labs  Lab 08/05/20 0401 08/08/20 0412 08/11/20 0447  NA 139 139 139  K 3.5 3.5 3.9  CL 101 102 99  CO2 32 34* 34*  GLUCOSE 160* 151* 157*  BUN 27* 29* 24*  CREATININE <0.30* <0.30* <0.30*  CALCIUM 9.1 8.6* 9.0  MG 1.9  --   --     ABG: No results for input(s): PHART, PCO2ART, PO2ART, HCO3, O2SAT in the last 168 hours.  Liver Function Tests: No results for input(s): AST, ALT, ALKPHOS, BILITOT, PROT, ALBUMIN in the last 168 hours. No results for input(s): LIPASE, AMYLASE in the last 168 hours. No results for input(s): AMMONIA in the last 168 hours.  CBC: Recent Labs  Lab 08/08/20 0412 08/11/20 0447  WBC 7.8 7.2  HGB 9.4* 8.6*  HCT 29.5* 25.8*  MCV 91.9 89.9  PLT 189 182    Cardiac Enzymes: No results for input(s): CKTOTAL, CKMB, CKMBINDEX, TROPONINI in the last 168 hours.  BNP (last 3  results) No results for input(s): BNP in the last 8760 hours.  ProBNP (last 3 results) No results for input(s): PROBNP in the last 8760 hours.  Radiological Exams: No results found.  Assessment/Plan Active Problems:   Acute on chronic respiratory failure with hypoxia (HCC)   Acute inflammatory demyelinating polyneuropathy (HCC)   Dysautonomia (HCC)   Atrial fibrillation with RVR (HCC)   Healthcare associated bacterial pneumonia   1. Acute on chronic respiratory failure hypoxia we will continue with full vent support right now holding off on weaning at this time. 2. Acute inflammatory demyelinating polyneuropathy no change we will continue with supportive care. 3. Dysautonomia has had bradycardia which is improved 4. Atrial fibrillation rate now rate controlled 5. Healthcare associated pneumonia treated   I have personally seen and evaluated the patient, evaluated laboratory and imaging results, formulated the assessment and plan and placed orders. The Patient requires high complexity decision making with multiple systems involvement.  Rounds were done with the Respiratory Therapy Director and Staff therapists and discussed with nursing staff also.  Yevonne Pax, MD [facility] Pulmonary Critical Care Medicine Sleep Medicine

## 2020-08-12 DIAGNOSIS — I4891 Unspecified atrial fibrillation: Secondary | ICD-10-CM | POA: Diagnosis not present

## 2020-08-12 DIAGNOSIS — G901 Familial dysautonomia [Riley-Day]: Secondary | ICD-10-CM | POA: Diagnosis not present

## 2020-08-12 DIAGNOSIS — J9621 Acute and chronic respiratory failure with hypoxia: Secondary | ICD-10-CM | POA: Diagnosis not present

## 2020-08-12 DIAGNOSIS — G610 Guillain-Barre syndrome: Secondary | ICD-10-CM | POA: Diagnosis not present

## 2020-08-12 NOTE — Progress Notes (Signed)
Pulmonary Critical Care Medicine [facility] [place]   PULMONARY CRITICAL CARE SERVICE  PROGRESS NOTE  Date of Service: 08/12/2020  Brian Espinoza  ZCH:[id number]  DOB: 1955/06/05   DOA: 06/05/2020  Referring Physician: Carron Curie, MD  HPI: Brian Espinoza is a 65 y.o. male seen for follow up of Acute on Chronic Respiratory Failure.  Patient is on assist control mode currently on 40% FiO2 good saturations are noted at this time is been on PEEP of 5  Medications: Reviewed on Rounds  Physical Exam:  Vitals: Temperature is 97.0 pulse 62 respiratory rate is 24 blood pressure is 123/72 saturations 100%  Ventilator Settings on assist control FiO2 40% tidal volume 400 PEEP 5  . General: Comfortable at this time . Eyes: Grossly normal lids, irises & conjunctiva . ENT: grossly tongue is normal . Neck: no obvious mass . Cardiovascular: S1 S2 normal no gallop . Respiratory: Scattered rhonchi expansion is equal at this time . Abdomen: soft . Skin: no rash seen on limited exam . Musculoskeletal: not rigid . Psychiatric:unable to assess . Neurologic: no seizure no involuntary movements         Lab Data:   Basic Metabolic Panel: Recent Labs  Lab 08/08/20 0412 08/11/20 0447  NA 139 139  K 3.5 3.9  CL 102 99  CO2 34* 34*  GLUCOSE 151* 157*  BUN 29* 24*  CREATININE <0.30* <0.30*  CALCIUM 8.6* 9.0    ABG: No results for input(s): PHART, PCO2ART, PO2ART, HCO3, O2SAT in the last 168 hours.  Liver Function Tests: No results for input(s): AST, ALT, ALKPHOS, BILITOT, PROT, ALBUMIN in the last 168 hours. No results for input(s): LIPASE, AMYLASE in the last 168 hours. No results for input(s): AMMONIA in the last 168 hours.  CBC: Recent Labs  Lab 08/08/20 0412 08/11/20 0447  WBC 7.8 7.2  HGB 9.4* 8.6*  HCT 29.5* 25.8*  MCV 91.9 89.9  PLT 189 182    Cardiac Enzymes: No results for input(s): CKTOTAL, CKMB, CKMBINDEX, TROPONINI in the last 168 hours.  BNP (last 3  results) No results for input(s): BNP in the last 8760 hours.  ProBNP (last 3 results) No results for input(s): PROBNP in the last 8760 hours.  Radiological Exams: No results found.  Assessment/Plan Active Problems:   Acute on chronic respiratory failure with hypoxia (HCC)   Acute inflammatory demyelinating polyneuropathy (HCC)   Dysautonomia (HCC)   Atrial fibrillation with RVR (HCC)   Healthcare associated bacterial pneumonia   1. Acute on chronic respiratory failure hypoxia we will continue with assist control patient is on 40% FiO2. 2. Acute inflammatory demyelinating polyneuropathy no change we will continue to follow along. 3. Dysautonomia patient is at baseline 4. Atrial fibrillation rate is controlled 5. Healthcare associated pneumonia treated   I have personally seen and evaluated the patient, evaluated laboratory and imaging results, formulated the assessment and plan and placed orders. The Patient requires high complexity decision making with multiple systems involvement.  Rounds were done with the Respiratory Therapy Director and Staff therapists and discussed with nursing staff also.  Yevonne Pax, MD [facility] Pulmonary Critical Care Medicine Sleep Medicine

## 2020-08-13 DIAGNOSIS — G901 Familial dysautonomia [Riley-Day]: Secondary | ICD-10-CM | POA: Diagnosis not present

## 2020-08-13 DIAGNOSIS — G610 Guillain-Barre syndrome: Secondary | ICD-10-CM | POA: Diagnosis not present

## 2020-08-13 DIAGNOSIS — J9621 Acute and chronic respiratory failure with hypoxia: Secondary | ICD-10-CM | POA: Diagnosis not present

## 2020-08-13 DIAGNOSIS — I4891 Unspecified atrial fibrillation: Secondary | ICD-10-CM | POA: Diagnosis not present

## 2020-08-13 NOTE — Consult Note (Signed)
Ref: Elana Alm, MD   Subjective:  Episodes of tachycardia and bradycardia continues. Now off Mestinon per family and referring doctor. He is back on low dose metoprolol and diltiazem per tube and low dose IV metoprolol as needed. He had 11 beat run of VT at 7:42 AM today. More alert today and nodes head in response to questions.  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   Objective:  Vital Signs in the last 24 hours: P: 110-140 bpm. R: 36, BP: 95/57. O2 sat 100 % on 40 % FiO2,  A/C, 400 TV, 5 PEEP.  Physical Exam: BP Readings from Last 1 Encounters:  05/06/19 124/79     Wt Readings from Last 1 Encounters:  04/04/14 101.2 kg    Weight change:  There is no height or weight on file to calculate BMI. HEENT: [place]/AT, Eyes-Blue, Conjunctiva-Pale, Sclera-Non-icteric Neck: No JVD, No bruit, Tracheostomy in place. Lungs:  Clearing, Bilateral. Cardiac:  Regular rhythm, normal S1 and S2, no S3. II/VI systolic murmur. Abdomen:  Soft, non-tender. BS present. Extremities:  No edema present. No cyanosis. No clubbing. CNS: AxOx1.  Skin: Warm and dry.   Intake/Output from previous day: No intake/output data recorded.    Lab Results: BMET    Component Value Date/Time   NA 139 08/11/2020 0447   NA 139 08/08/2020 0412   NA 139 08/05/2020 0401   K 3.9 08/11/2020 0447   K 3.5 08/08/2020 0412   K 3.5 08/05/2020 0401   CL 99 08/11/2020 0447   CL 102 08/08/2020 0412   CL 101 08/05/2020 0401   CO2 34 (H) 08/11/2020 0447   CO2 34 (H) 08/08/2020 0412   CO2 32 08/05/2020 0401   GLUCOSE 157 (H) 08/11/2020 0447   GLUCOSE 151 (H) 08/08/2020 0412   GLUCOSE 160 (H) 08/05/2020 0401   BUN 24 (H) 08/11/2020 0447   BUN 29 (H) 08/08/2020 0412   BUN 27 (H) 08/05/2020 0401   CREATININE <0.30 (L) 08/11/2020 0447   CREATININE <0.30 (L) 08/08/2020 0412   CREATININE <0.30 (L) 08/05/2020 0401   CALCIUM 9.0 08/11/2020 0447   CALCIUM 8.6 (L) 08/08/2020  0412   CALCIUM 9.1 08/05/2020 0401   GFRNONAA NOT CALCULATED 08/11/2020 0447   GFRNONAA NOT CALCULATED 08/08/2020 0412   GFRNONAA NOT CALCULATED 08/05/2020 0401   CBC    Component Value Date/Time   WBC 7.2 08/11/2020 0447   RBC 2.87 (L) 08/11/2020 0447   HGB 8.6 (L) 08/11/2020 0447   HCT 25.8 (L) 08/11/2020 0447   PLT 182 08/11/2020 0447   MCV 89.9 08/11/2020 0447   MCH 30.0 08/11/2020 0447   MCHC 33.3 08/11/2020 0447   RDW 18.2 (H) 08/11/2020 0447   LYMPHSABS 2.3 06/06/2020 0418   MONOABS 0.9 06/06/2020 0418   EOSABS 0.0 06/06/2020 0418   BASOSABS 0.1 06/06/2020 0418   HEPATIC Function Panel Recent Labs    06/06/20 0418 07/19/20 0511 07/27/20 0433  PROT 6.8 4.8* 4.9*   HEMOGLOBIN A1C No components found for: HGA1C,  MPG CARDIAC ENZYMES Lab Results  Component Value Date   CKTOTAL 44 (L) 07/02/2020   BNP No results for input(s): PROBNP in the last 8760 hours. TSH Recent Labs    06/06/20 0418  TSH 3.257   CHOLESTEROL No results for input(s): CHOL in the last 8760 hours.  Scheduled Meds: Continuous Infusions: PRN Meds:.  Assessment/Plan: Atrial fibrillation with RVR Non-sustained VT Acute on chronic respiratory failure with hypoxia  CAD HCM with obstruction S/P pneumonia S/P demyelinating polyneuropathy S/P tracheostomy S/P PEG placement  Plan:  Resume metoprolol, diltiazem and amiodarone for atrial fibrillation and non sustained VT. Discussed care with daughter/Family/Nurse   LOS: 0 days   Time spent including chart review, lab review, examination, discussion with patient/Nurse/Family/Doctor : 30 min   Orpah Cobb  MD  08/13/2020, 3:31 PM

## 2020-08-13 NOTE — Progress Notes (Signed)
Pulmonary Critical Care Medicine [facility] [place]   PULMONARY CRITICAL CARE SERVICE  PROGRESS NOTE  Date of Service: 08/13/2020  Brian Espinoza  RJJ:[id number]  DOB: 1955-07-05   DOA: 06/05/2020  Referring Physician: Carron Curie, MD  HPI: Brian Espinoza is a 65 y.o. male seen for follow up of Acute on Chronic Respiratory Failure.  Patient remains on the ventilator and full support right now is on assist control mode no fevers are noted  Medications: Reviewed on Rounds  Physical Exam:  Vitals: Temperature is 97.9 pulse 120 respiratory 20 blood pressure is 99/51 saturations 100%  Ventilator Settings on assist control FiO2 is 40% tidal volume 400 PEEP 5  . General: Comfortable at this time . Eyes: Grossly normal lids, irises & conjunctiva . ENT: grossly tongue is normal . Neck: no obvious mass . Cardiovascular: S1 S2 normal no gallop . Respiratory: Scattered rhonchi expansion is equal . Abdomen: soft . Skin: no rash seen on limited exam . Musculoskeletal: not rigid . Psychiatric:unable to assess . Neurologic: no seizure no involuntary movements         Lab Data:   Basic Metabolic Panel: Recent Labs  Lab 08/08/20 0412 08/11/20 0447  NA 139 139  K 3.5 3.9  CL 102 99  CO2 34* 34*  GLUCOSE 151* 157*  BUN 29* 24*  CREATININE <0.30* <0.30*  CALCIUM 8.6* 9.0    ABG: No results for input(s): PHART, PCO2ART, PO2ART, HCO3, O2SAT in the last 168 hours.  Liver Function Tests: No results for input(s): AST, ALT, ALKPHOS, BILITOT, PROT, ALBUMIN in the last 168 hours. No results for input(s): LIPASE, AMYLASE in the last 168 hours. No results for input(s): AMMONIA in the last 168 hours.  CBC: Recent Labs  Lab 08/08/20 0412 08/11/20 0447  WBC 7.8 7.2  HGB 9.4* 8.6*  HCT 29.5* 25.8*  MCV 91.9 89.9  PLT 189 182    Cardiac Enzymes: No results for input(s): CKTOTAL, CKMB, CKMBINDEX, TROPONINI in the last 168 hours.  BNP (last 3 results) No results for  input(s): BNP in the last 8760 hours.  ProBNP (last 3 results) No results for input(s): PROBNP in the last 8760 hours.  Radiological Exams: No results found.  Assessment/Plan Active Problems:   Acute on chronic respiratory failure with hypoxia (HCC)   Acute inflammatory demyelinating polyneuropathy (HCC)   Dysautonomia (HCC)   Atrial fibrillation with RVR (HCC)   Healthcare associated bacterial pneumonia   1. Acute on chronic respiratory failure hypoxia we will continue with full support on the ventilator patient's not tolerating weaning. 2. Acute inflammatory demyelinating polyneuropathy no change continue to monitor 3. Dysautonomia heart rate still waxing waning 4. Atrial fibrillation patient is having tachybradycardia 5. Healthcare associated pneumonia has been treated with antibiotic   I have personally seen and evaluated the patient, evaluated laboratory and imaging results, formulated the assessment and plan and placed orders. The Patient requires high complexity decision making with multiple systems involvement.  Rounds were done with the Respiratory Therapy Director and Staff therapists and discussed with nursing staff also.  Yevonne Pax, MD [facility] Pulmonary Critical Care Medicine Sleep Medicine

## 2020-08-13 NOTE — Consult Note (Addendum)
Ref: Elana Alm, MD   Subjective:  Episodes of tachycardia and bradycardia. HR in 120-130, irregular.  Objective:  Vital Signs in the last 24 hours: BP: 90/60, P: 120-130, R: 30, BP: 110/60 saturation 100 %.  Physical Exam: BP Readings from Last 1 Encounters:  05/06/19 124/79     Wt Readings from Last 1 Encounters:  04/04/14 101.2 kg    Weight change:  There is no height or weight on file to calculate BMI. HEENT: [place]/AT, Eyes-Blue, Conjunctiva-Pale, Sclera-Non-icteric Neck: No JVD, No bruit, Tracheostomy in place. Lungs:  Clearing, Bilateral. Cardiac:  Irregular rhythm, normal S1 and S2, no S3. II/VI systolic murmur. Abdomen:  Soft, non-tender. BS present. Extremities:  No edema present. No cyanosis. No clubbing. CNS: AxOx0.  Skin: Warm and dry.   Intake/Output from previous day: No intake/output data recorded.    Lab Results: BMET    Component Value Date/Time   NA 139 08/11/2020 0447   NA 139 08/08/2020 0412   NA 139 08/05/2020 0401   K 3.9 08/11/2020 0447   K 3.5 08/08/2020 0412   K 3.5 08/05/2020 0401   CL 99 08/11/2020 0447   CL 102 08/08/2020 0412   CL 101 08/05/2020 0401   CO2 34 (H) 08/11/2020 0447   CO2 34 (H) 08/08/2020 0412   CO2 32 08/05/2020 0401   GLUCOSE 157 (H) 08/11/2020 0447   GLUCOSE 151 (H) 08/08/2020 0412   GLUCOSE 160 (H) 08/05/2020 0401   BUN 24 (H) 08/11/2020 0447   BUN 29 (H) 08/08/2020 0412   BUN 27 (H) 08/05/2020 0401   CREATININE <0.30 (L) 08/11/2020 0447   CREATININE <0.30 (L) 08/08/2020 0412   CREATININE <0.30 (L) 08/05/2020 0401   CALCIUM 9.0 08/11/2020 0447   CALCIUM 8.6 (L) 08/08/2020 0412   CALCIUM 9.1 08/05/2020 0401   GFRNONAA NOT CALCULATED 08/11/2020 0447   GFRNONAA NOT CALCULATED 08/08/2020 0412   GFRNONAA NOT CALCULATED 08/05/2020 0401   CBC    Component Value Date/Time   WBC 7.2 08/11/2020 0447   RBC 2.87 (L) 08/11/2020 0447   HGB 8.6 (L) 08/11/2020 0447   HCT 25.8 (L) 08/11/2020 0447   PLT 182  08/11/2020 0447   MCV 89.9 08/11/2020 0447   MCH 30.0 08/11/2020 0447   MCHC 33.3 08/11/2020 0447   RDW 18.2 (H) 08/11/2020 0447   LYMPHSABS 2.3 06/06/2020 0418   MONOABS 0.9 06/06/2020 0418   EOSABS 0.0 06/06/2020 0418   BASOSABS 0.1 06/06/2020 0418   HEPATIC Function Panel Recent Labs    06/06/20 0418 07/19/20 0511 07/27/20 0433  PROT 6.8 4.8* 4.9*   HEMOGLOBIN A1C No components found for: HGA1C,  MPG CARDIAC ENZYMES Lab Results  Component Value Date   CKTOTAL 44 (L) 07/02/2020   BNP No results for input(s): PROBNP in the last 8760 hours. TSH Recent Labs    06/06/20 0418  TSH 3.257   CHOLESTEROL No results for input(s): CHOL in the last 8760 hours.  Scheduled Meds: Continuous Infusions: PRN Meds:.  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 She still wants medication Mestinon used. In anticipation of bradycardia will reduce the doses of metoprolol and diltiazem.   LOS: 0 days   Time spent including chart review, lab review, examination, discussion with patient/Doctor/Wife : 30 min  Orpah Cobb  MD  08/13/2020, 3:24 PM

## 2020-08-14 ENCOUNTER — Other Ambulatory Visit (HOSPITAL_COMMUNITY): Payer: Medicare Other

## 2020-08-14 DIAGNOSIS — G610 Guillain-Barre syndrome: Secondary | ICD-10-CM | POA: Diagnosis not present

## 2020-08-14 DIAGNOSIS — I4891 Unspecified atrial fibrillation: Secondary | ICD-10-CM | POA: Diagnosis not present

## 2020-08-14 DIAGNOSIS — G901 Familial dysautonomia [Riley-Day]: Secondary | ICD-10-CM | POA: Diagnosis not present

## 2020-08-14 DIAGNOSIS — J9621 Acute and chronic respiratory failure with hypoxia: Secondary | ICD-10-CM | POA: Diagnosis not present

## 2020-08-14 LAB — MAGNESIUM: Magnesium: 2 mg/dL (ref 1.7–2.4)

## 2020-08-14 LAB — CBC
HCT: 30.3 % — ABNORMAL LOW (ref 39.0–52.0)
Hemoglobin: 9.7 g/dL — ABNORMAL LOW (ref 13.0–17.0)
MCH: 29.3 pg (ref 26.0–34.0)
MCHC: 32 g/dL (ref 30.0–36.0)
MCV: 91.5 fL (ref 80.0–100.0)
Platelets: 185 10*3/uL (ref 150–400)
RBC: 3.31 MIL/uL — ABNORMAL LOW (ref 4.22–5.81)
RDW: 18.6 % — ABNORMAL HIGH (ref 11.5–15.5)
WBC: 8.6 10*3/uL (ref 4.0–10.5)
nRBC: 0.5 % — ABNORMAL HIGH (ref 0.0–0.2)

## 2020-08-14 LAB — BASIC METABOLIC PANEL
Anion gap: 5 (ref 5–15)
BUN: 24 mg/dL — ABNORMAL HIGH (ref 8–23)
CO2: 36 mmol/L — ABNORMAL HIGH (ref 22–32)
Calcium: 9.3 mg/dL (ref 8.9–10.3)
Chloride: 97 mmol/L — ABNORMAL LOW (ref 98–111)
Creatinine, Ser: <0.3 mg/dL — ABNORMAL LOW (ref 0.61–1.24)
Glucose, Bld: 156 mg/dL — ABNORMAL HIGH (ref 70–99)
Potassium: 3.4 mmol/L — ABNORMAL LOW (ref 3.5–5.1)
Sodium: 138 mmol/L (ref 135–145)

## 2020-08-14 NOTE — Consult Note (Signed)
Ref: Elana Alm, MD   Subjective:  Episodes of tachycardia continue. More so when he is agitated.  Objective:  Vital Signs in the last 24 hours: BP: 130/90, P: 130's, R: 30, O2 sat 99 % on 40 % FiO2.   Physical Exam: BP Readings from Last 1 Encounters:  05/06/19 124/79     Wt Readings from Last 1 Encounters:  04/04/14 101.2 kg    Weight change:  There is no height or weight on file to calculate BMI. HEENT: [place]/AT, Eyes-Blue, Conjunctiva-Pink, Sclera-Non-icteric Neck: No JVD, No bruit, Trachea midline. Lungs:  Clearing, Bilateral. Cardiac:  Irregular rhythm, normal S1 and S2, no S3. II/VI systolic murmur. Abdomen:  Soft, non-tender. BS present. Extremities:  No edema present. No cyanosis. No clubbing. CNS: AxOx1.  Skin: Warm and dry.   Intake/Output from previous day: No intake/output data recorded.    Lab Results: BMET    Component Value Date/Time   NA 138 08/14/2020 0704   NA 139 08/11/2020 0447   NA 139 08/08/2020 0412   K 3.4 (L) 08/14/2020 0704   K 3.9 08/11/2020 0447   K 3.5 08/08/2020 0412   CL 97 (L) 08/14/2020 0704   CL 99 08/11/2020 0447   CL 102 08/08/2020 0412   CO2 36 (H) 08/14/2020 0704   CO2 34 (H) 08/11/2020 0447   CO2 34 (H) 08/08/2020 0412   GLUCOSE 156 (H) 08/14/2020 0704   GLUCOSE 157 (H) 08/11/2020 0447   GLUCOSE 151 (H) 08/08/2020 0412   BUN 24 (H) 08/14/2020 0704   BUN 24 (H) 08/11/2020 0447   BUN 29 (H) 08/08/2020 0412   CREATININE <0.30 (L) 08/14/2020 0704   CREATININE <0.30 (L) 08/11/2020 0447   CREATININE <0.30 (L) 08/08/2020 0412   CALCIUM 9.3 08/14/2020 0704   CALCIUM 9.0 08/11/2020 0447   CALCIUM 8.6 (L) 08/08/2020 0412   GFRNONAA NOT CALCULATED 08/14/2020 0704   GFRNONAA NOT CALCULATED 08/11/2020 0447   GFRNONAA NOT CALCULATED 08/08/2020 0412   CBC    Component Value Date/Time   WBC 8.6 08/14/2020 0704   RBC 3.31 (L) 08/14/2020 0704   HGB 9.7 (L) 08/14/2020 0704   HCT 30.3 (L) 08/14/2020 0704   PLT 185 08/14/2020  0704   MCV 91.5 08/14/2020 0704   MCH 29.3 08/14/2020 0704   MCHC 32.0 08/14/2020 0704   RDW 18.6 (H) 08/14/2020 0704   LYMPHSABS 2.3 06/06/2020 0418   MONOABS 0.9 06/06/2020 0418   EOSABS 0.0 06/06/2020 0418   BASOSABS 0.1 06/06/2020 0418   HEPATIC Function Panel Recent Labs    06/06/20 0418 07/19/20 0511 07/27/20 0433  PROT 6.8 4.8* 4.9*   HEMOGLOBIN A1C No components found for: HGA1C,  MPG CARDIAC ENZYMES Lab Results  Component Value Date   CKTOTAL 44 (L) 07/02/2020   BNP No results for input(s): PROBNP in the last 8760 hours. TSH Recent Labs    06/06/20 0418  TSH 3.257   CHOLESTEROL No results for input(s): CHOL in the last 8760 hours.  Scheduled Meds: Continuous Infusions: PRN Meds:.  Assessment/Plan: Atrial fibrillation with RVR Acute on chronic respiratory failure with hypoxia CAD HCM with obstruction S/P pneumonia S/P demyelinating polyneuropathy S/P tracheostomy S/P PEG placement  Increase metoprolol, diltiazem and amiodarone dose as tolerated.   LOS: 0 days   Time spent including chart review, lab review, examination, discussion with patient/Nurse/Family : 30 min   Orpah Cobb  MD  08/14/2020, 8:06 PM

## 2020-08-15 DIAGNOSIS — I4891 Unspecified atrial fibrillation: Secondary | ICD-10-CM | POA: Diagnosis not present

## 2020-08-15 DIAGNOSIS — G901 Familial dysautonomia [Riley-Day]: Secondary | ICD-10-CM | POA: Diagnosis not present

## 2020-08-15 DIAGNOSIS — J9621 Acute and chronic respiratory failure with hypoxia: Secondary | ICD-10-CM | POA: Diagnosis not present

## 2020-08-15 DIAGNOSIS — G610 Guillain-Barre syndrome: Secondary | ICD-10-CM | POA: Diagnosis not present

## 2020-08-15 LAB — POTASSIUM: Potassium: 3.9 mmol/L (ref 3.5–5.1)

## 2020-08-15 NOTE — Consult Note (Signed)
Ref: Brian Alm, MD   Subjective:  Sitting up in recliner. Episodes of tachycardia continues.  Objective:  Vital Signs in the last 24 hours: BP: 111/55, R: 18, P: 110 to 140/min  O2 sat 99 % on 40 % FiO2, 16 A/C, 400 TV. 5 PEEP.  Physical Exam: BP Readings from Last 1 Encounters:  05/06/19 124/79     Wt Readings from Last 1 Encounters:  04/04/14 101.2 kg    Weight change:  There is no height or weight on file to calculate BMI. HEENT: [place]/AT, Eyes-Blue, Conjunctiva-Pale pink, Sclera-Non-icteric Neck: No JVD, No bruit, Trachea midline. Lungs:  Clearing, Bilateral. Cardiac:  Irregular rhythm, normal S1 and S2, no S3. II/VI systolic murmur. Abdomen:  Soft, non-tender. BS present. Extremities:  No edema present. No cyanosis. No clubbing. CNS: AxOx1 Random motion of upper extremities, chest, head and neck.  Skin: Warm and dry.   Intake/Output from previous day: No intake/output data recorded.    Lab Results: BMET    Component Value Date/Time   NA 138 08/14/2020 0704   NA 139 08/11/2020 0447   NA 139 08/08/2020 0412   K 3.4 (L) 08/14/2020 0704   K 3.9 08/11/2020 0447   K 3.5 08/08/2020 0412   CL 97 (L) 08/14/2020 0704   CL 99 08/11/2020 0447   CL 102 08/08/2020 0412   CO2 36 (H) 08/14/2020 0704   CO2 34 (H) 08/11/2020 0447   CO2 34 (H) 08/08/2020 0412   GLUCOSE 156 (H) 08/14/2020 0704   GLUCOSE 157 (H) 08/11/2020 0447   GLUCOSE 151 (H) 08/08/2020 0412   BUN 24 (H) 08/14/2020 0704   BUN 24 (H) 08/11/2020 0447   BUN 29 (H) 08/08/2020 0412   CREATININE <0.30 (L) 08/14/2020 0704   CREATININE <0.30 (L) 08/11/2020 0447   CREATININE <0.30 (L) 08/08/2020 0412   CALCIUM 9.3 08/14/2020 0704   CALCIUM 9.0 08/11/2020 0447   CALCIUM 8.6 (L) 08/08/2020 0412   GFRNONAA NOT CALCULATED 08/14/2020 0704   GFRNONAA NOT CALCULATED 08/11/2020 0447   GFRNONAA NOT CALCULATED 08/08/2020 0412   CBC    Component Value Date/Time   WBC 8.6 08/14/2020 0704   RBC 3.31 (L)  08/14/2020 0704   HGB 9.7 (L) 08/14/2020 0704   HCT 30.3 (L) 08/14/2020 0704   PLT 185 08/14/2020 0704   MCV 91.5 08/14/2020 0704   MCH 29.3 08/14/2020 0704   MCHC 32.0 08/14/2020 0704   RDW 18.6 (H) 08/14/2020 0704   LYMPHSABS 2.3 06/06/2020 0418   MONOABS 0.9 06/06/2020 0418   EOSABS 0.0 06/06/2020 0418   BASOSABS 0.1 06/06/2020 0418   HEPATIC Function Panel Recent Labs    06/06/20 0418 07/19/20 0511 07/27/20 0433  PROT 6.8 4.8* 4.9*   HEMOGLOBIN A1C No components found for: HGA1C,  MPG CARDIAC ENZYMES Lab Results  Component Value Date   CKTOTAL 44 (L) 07/02/2020   BNP No results for input(s): PROBNP in the last 8760 hours. TSH Recent Labs    06/06/20 0418  TSH 3.257   CHOLESTEROL No results for input(s): CHOL in the last 8760 hours.  Scheduled Meds: Continuous Infusions: PRN Meds:.  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   LOS: 0 days   Time spent including chart review, lab review, examination, discussion with patient/Nurse : 25 min   Brian Espinoza Algie Coffer  MD  08/15/2020, 9:42 AM

## 2020-08-15 NOTE — Progress Notes (Signed)
Pulmonary Critical Care Medicine [facility] [place]   PULMONARY CRITICAL CARE SERVICE  PROGRESS NOTE  Date of Service: 08/15/2020   Brian Espinoza  WNI:[id number]  DOB: November 23, 1955   DOA: 06/05/2020  Referring Physician: Carron Curie, MD  HPI: Brian Espinoza is a 65 y.o. male seen for follow up of Acute on Chronic Respiratory Failure.  Patient is comfortable right now without distress has been still having some issues with cardiac arrhythmias cardiology is still following along  Medications: Reviewed on Rounds  Physical Exam:  Vitals: Temperature 99.3 pulse 118 respiratory rate is 19 blood pressure is 137/92 saturations 100%  Ventilator Settings on assist control FiO2 40% tidal volume 400 PEEP 5  . General: Comfortable at this time . Eyes: Grossly normal lids, irises & conjunctiva . ENT: grossly tongue is normal . Neck: no obvious mass . Cardiovascular: S1 S2 normal no gallop . Respiratory: Scattered rhonchi expansion is equal . Abdomen: soft . Skin: no rash seen on limited exam . Musculoskeletal: not rigid . Psychiatric:unable to assess . Neurologic: no seizure no involuntary movements         Lab Data:   Basic Metabolic Panel: Recent Labs  Lab 08/11/20 0447 08/14/20 0704 08/15/20 0924  NA 139 138  --   K 3.9 3.4* 3.9  CL 99 97*  --   CO2 34* 36*  --   GLUCOSE 157* 156*  --   BUN 24* 24*  --   CREATININE <0.30* <0.30*  --   CALCIUM 9.0 9.3  --   MG  --  2.0  --     ABG: No results for input(s): PHART, PCO2ART, PO2ART, HCO3, O2SAT in the last 168 hours.  Liver Function Tests: No results for input(s): AST, ALT, ALKPHOS, BILITOT, PROT, ALBUMIN in the last 168 hours. No results for input(s): LIPASE, AMYLASE in the last 168 hours. No results for input(s): AMMONIA in the last 168 hours.  CBC: Recent Labs  Lab 08/11/20 0447 08/14/20 0704  WBC 7.2 8.6  HGB 8.6* 9.7*  HCT 25.8* 30.3*  MCV 89.9 91.5  PLT 182 185    Cardiac Enzymes: No results for  input(s): CKTOTAL, CKMB, CKMBINDEX, TROPONINI in the last 168 hours.  BNP (last 3 results) No results for input(s): BNP in the last 8760 hours.  ProBNP (last 3 results) No results for input(s): PROBNP in the last 8760 hours.  Radiological Exams: DG Chest Port 1 View  Result Date: 08/14/2020 CLINICAL DATA:  Respiratory failure. EXAM: PORTABLE CHEST 1 VIEW COMPARISON:  August 01, 2020. FINDINGS: Stable cardiomegaly. Tracheostomy tube is unchanged in position. No pneumothorax is noted. Stable bibasilar atelectasis is noted with associated pleural effusions. Bony thorax is unremarkable. IMPRESSION: Stable bibasilar atelectasis with associated pleural effusions. Electronically Signed   By: Lupita Raider M.D.   On: 08/14/2020 14:37    Assessment/Plan Active Problems:   Acute on chronic respiratory failure with hypoxia (HCC)   Acute inflammatory demyelinating polyneuropathy (HCC)   Dysautonomia (HCC)   Atrial fibrillation with RVR (HCC)   Healthcare associated bacterial pneumonia   1. Acute on chronic respiratory failure hypoxia we will continue with full support on the ventilator while the cardiac issues are sorted out 2. Acute inflammatory demyelinating polyneuropathy no change 3. Dysautonomia responsible for cardiac arrhythmias 4. Atrial fibrillation right now rate controlled 5. Healthcare associated pneumonia has been treated improved   I have personally seen and evaluated the patient, evaluated laboratory and imaging results, formulated the assessment and plan and  placed orders. The Patient requires high complexity decision making with multiple systems involvement.  Rounds were done with the Respiratory Therapy Director and Staff therapists and discussed with nursing staff also.  Allyne Gee, MD [facility] Pulmonary Critical Care Medicine Sleep Medicine

## 2020-08-15 NOTE — Progress Notes (Addendum)
Pulmonary Critical Care Medicine [facility] [place]   PULMONARY CRITICAL CARE SERVICE  PROGRESS NOTE    Brian Espinoza  ZOX:[id number]  DOB: [birth year]   DOA: 06/05/2020  Referring Physician: Carron Curie, MD  HPI: Brian Espinoza is a 65 y.o. male seen for follow up of Acute on Chronic Respiratory Failure.  Patient currently is on the ventilator and full support assist control mode no fevers are noted  Medications: Reviewed on Rounds  Physical Exam:  Vitals: Temperature is 97.2 pulse 96 respiratory 23 blood pressure is 101/60 saturations 100%  Ventilator Settings on assist control FiO2 40% tidal line 400 PEEP 5  . General: Comfortable at this time . Eyes: Grossly normal lids, irises & conjunctiva . ENT: grossly tongue is normal . Neck: no obvious mass . Cardiovascular: S1 S2 normal no gallop . Respiratory: Scattered rhonchi very coarse breath sounds . Abdomen: soft . Skin: no rash seen on limited exam . Musculoskeletal: not rigid . Psychiatric:unable to assess . Neurologic: no seizure no involuntary movements         Lab Data:   Basic Metabolic Panel: Recent Labs  Lab 08/11/20 0447 08/14/20 0704  NA 139 138  K 3.9 3.4*  CL 99 97*  CO2 34* 36*  GLUCOSE 157* 156*  BUN 24* 24*  CREATININE <0.30* <0.30*  CALCIUM 9.0 9.3  MG  --  2.0    ABG: No results for input(s): PHART, PCO2ART, PO2ART, HCO3, O2SAT in the last 168 hours.  Liver Function Tests: No results for input(s): AST, ALT, ALKPHOS, BILITOT, PROT, ALBUMIN in the last 168 hours. No results for input(s): LIPASE, AMYLASE in the last 168 hours. No results for input(s): AMMONIA in the last 168 hours.  CBC: Recent Labs  Lab 08/11/20 0447 08/14/20 0704  WBC 7.2 8.6  HGB 8.6* 9.7*  HCT 25.8* 30.3*  MCV 89.9 91.5  PLT 182 185    Cardiac Enzymes: No results for input(s): CKTOTAL, CKMB, CKMBINDEX, TROPONINI in the last 168 hours.  BNP (last 3 results) No results for input(s): BNP in the last  8760 hours.  ProBNP (last 3 results) No results for input(s): PROBNP in the last 8760 hours.  Radiological Exams: DG Chest Port 1 View  Result Date: 08/14/2020 CLINICAL DATA:  Respiratory failure. EXAM: PORTABLE CHEST 1 VIEW COMPARISON:  August 01, 2020. FINDINGS: Stable cardiomegaly. Tracheostomy tube is unchanged in position. No pneumothorax is noted. Stable bibasilar atelectasis is noted with associated pleural effusions. Bony thorax is unremarkable. IMPRESSION: Stable bibasilar atelectasis with associated pleural effusions. Electronically Signed   By: Lupita Raider M.D.   On: 08/14/2020 14:37    Assessment/Plan Active Problems:   Acute on chronic respiratory failure with hypoxia (HCC)   Acute inflammatory demyelinating polyneuropathy (HCC)   Dysautonomia (HCC)   Atrial fibrillation with RVR (HCC)   Healthcare associated bacterial pneumonia   1. Acute on chronic respiratory failure hypoxia plan is to continue with full support on assist control titrate oxygen as tolerated continue pulmonary toilet. 2. Acute inflammatory demyelinating polyneuropathy no change we will continue to monitor. 3. Dysautonomia at baseline 4. Atrial fibrillation rate is controlled at this time 5. Healthcare associated pneumonia treated and resolved   I have personally seen and evaluated the patient, evaluated laboratory and imaging results, formulated the assessment and plan and placed orders. The Patient requires high complexity decision making with multiple systems involvement.  Rounds were done with the Respiratory Therapy Director and Staff therapists and discussed with nursing staff also.  Allyne Gee, MD [facility] Pulmonary Critical Care Medicine Sleep Medicine

## 2020-08-16 DIAGNOSIS — I4891 Unspecified atrial fibrillation: Secondary | ICD-10-CM | POA: Diagnosis not present

## 2020-08-16 DIAGNOSIS — G610 Guillain-Barre syndrome: Secondary | ICD-10-CM | POA: Diagnosis not present

## 2020-08-16 DIAGNOSIS — G901 Familial dysautonomia [Riley-Day]: Secondary | ICD-10-CM | POA: Diagnosis not present

## 2020-08-16 DIAGNOSIS — J9621 Acute and chronic respiratory failure with hypoxia: Secondary | ICD-10-CM | POA: Diagnosis not present

## 2020-08-16 NOTE — Progress Notes (Signed)
Pulmonary Critical Care Medicine [facility] [place]   PULMONARY CRITICAL CARE SERVICE  PROGRESS NOTE  Date of Service: 08/16/2020   Kalani Sthilaire  LKH:[id number]  DOB: May 06, 1956   DOA: 06/05/2020  Referring Physician: Carron Curie, MD  HPI: Kayne Yuhas is a 65 y.o. male seen for follow up of Acute on Chronic Respiratory Failure.  Patient has been having waxing and waning heart rate increased episodes now seems to be down again  Medications: Reviewed on Rounds  Physical Exam:  Vitals: Temperature 96.7 pulse 83 respiratory 27 blood pressure is 90/58 saturations 100%  Ventilator Settings on assist control FiO2 40% tidal volume is 400 PEEP 8  . General: Comfortable at this time . Eyes: Grossly normal lids, irises & conjunctiva . ENT: grossly tongue is normal . Neck: no obvious mass . Cardiovascular: S1 S2 normal no gallop . Respiratory: Scattered rhonchi expansion is equal . Abdomen: soft . Skin: no rash seen on limited exam . Musculoskeletal: not rigid . Psychiatric:unable to assess . Neurologic: no seizure no involuntary movements         Lab Data:   Basic Metabolic Panel: Recent Labs  Lab 08/11/20 0447 08/14/20 0704 08/15/20 0924  NA 139 138  --   K 3.9 3.4* 3.9  CL 99 97*  --   CO2 34* 36*  --   GLUCOSE 157* 156*  --   BUN 24* 24*  --   CREATININE <0.30* <0.30*  --   CALCIUM 9.0 9.3  --   MG  --  2.0  --     ABG: No results for input(s): PHART, PCO2ART, PO2ART, HCO3, O2SAT in the last 168 hours.  Liver Function Tests: No results for input(s): AST, ALT, ALKPHOS, BILITOT, PROT, ALBUMIN in the last 168 hours. No results for input(s): LIPASE, AMYLASE in the last 168 hours. No results for input(s): AMMONIA in the last 168 hours.  CBC: Recent Labs  Lab 08/11/20 0447 08/14/20 0704  WBC 7.2 8.6  HGB 8.6* 9.7*  HCT 25.8* 30.3*  MCV 89.9 91.5  PLT 182 185    Cardiac Enzymes: No results for input(s): CKTOTAL, CKMB, CKMBINDEX, TROPONINI in the  last 168 hours.  BNP (last 3 results) No results for input(s): BNP in the last 8760 hours.  ProBNP (last 3 results) No results for input(s): PROBNP in the last 8760 hours.  Radiological Exams: DG Chest Port 1 View  Result Date: 08/14/2020 CLINICAL DATA:  Respiratory failure. EXAM: PORTABLE CHEST 1 VIEW COMPARISON:  August 01, 2020. FINDINGS: Stable cardiomegaly. Tracheostomy tube is unchanged in position. No pneumothorax is noted. Stable bibasilar atelectasis is noted with associated pleural effusions. Bony thorax is unremarkable. IMPRESSION: Stable bibasilar atelectasis with associated pleural effusions. Electronically Signed   By: Lupita Raider M.D.   On: 08/14/2020 14:37    Assessment/Plan Active Problems:   Acute on chronic respiratory failure with hypoxia (HCC)   Acute inflammatory demyelinating polyneuropathy (HCC)   Dysautonomia (HCC)   Atrial fibrillation with RVR (HCC)   Healthcare associated bacterial pneumonia   1. Acute on chronic respiratory failure hypoxia we will hold off on weaning because of the tachycardia issues right now. 2. Acute inflammatory demyelinating polyneuropathy we will continue with supportive care 3. Dysautonomia heart rate waxing and waning 4. Atrial fibrillation rate now rate controlled 5. Healthcare associated pneumonia treated   I have personally seen and evaluated the patient, evaluated laboratory and imaging results, formulated the assessment and plan and placed orders. The Patient requires high  complexity decision making with multiple systems involvement.  Rounds were done with the Respiratory Therapy Director and Staff therapists and discussed with nursing staff also.  Allyne Gee, MD [facility] Pulmonary Critical Care Medicine Sleep Medicine

## 2020-08-16 NOTE — Consult Note (Signed)
Ref: Elana Alm, MD   Subjective:  In bed, appears comfortable. HR 110 to 140/min. Atrial fibrillation continues.  Objective:  Vital Signs in the last 24 hours: BP: 103/69, P: 110-140/min, R: 26, O2 sat: 100 %, on ventilator with 16 A/C, 400 cc TV and 40 % FiO2.    Physical Exam: BP Readings from Last 1 Encounters:  05/06/19 124/79     Wt Readings from Last 1 Encounters:  04/04/14 101.2 kg    Weight change:  There is no height or weight on file to calculate BMI. HEENT: [place]/AT, Eyes-Blue, Conjunctiva-Pale pink, Sclera-Non-icteric Neck: No JVD, No bruit, Trachea midline. Lungs:  Clearing, Bilateral. Cardiac: Irregular rhythm, normal S1 and S2, no S3. II/VI systolic murmur. Abdomen:  Soft, non-tender. BS present. Extremities:  No edema present. No cyanosis. No clubbing. CNS: AxOx1. Random upper body movement, Cranial nerves grossly intact, moves all 4 extremities.  Skin: Warm and dry.   Intake/Output from previous day: No intake/output data recorded.    Lab Results: BMET    Component Value Date/Time   NA 138 08/14/2020 0704   NA 139 08/11/2020 0447   NA 139 08/08/2020 0412   K 3.9 08/15/2020 0924   K 3.4 (L) 08/14/2020 0704   K 3.9 08/11/2020 0447   CL 97 (L) 08/14/2020 0704   CL 99 08/11/2020 0447   CL 102 08/08/2020 0412   CO2 36 (H) 08/14/2020 0704   CO2 34 (H) 08/11/2020 0447   CO2 34 (H) 08/08/2020 0412   GLUCOSE 156 (H) 08/14/2020 0704   GLUCOSE 157 (H) 08/11/2020 0447   GLUCOSE 151 (H) 08/08/2020 0412   BUN 24 (H) 08/14/2020 0704   BUN 24 (H) 08/11/2020 0447   BUN 29 (H) 08/08/2020 0412   CREATININE <0.30 (L) 08/14/2020 0704   CREATININE <0.30 (L) 08/11/2020 0447   CREATININE <0.30 (L) 08/08/2020 0412   CALCIUM 9.3 08/14/2020 0704   CALCIUM 9.0 08/11/2020 0447   CALCIUM 8.6 (L) 08/08/2020 0412   GFRNONAA NOT CALCULATED 08/14/2020 0704   GFRNONAA NOT CALCULATED 08/11/2020 0447   GFRNONAA NOT CALCULATED 08/08/2020 0412   CBC    Component  Value Date/Time   WBC 8.6 08/14/2020 0704   RBC 3.31 (L) 08/14/2020 0704   HGB 9.7 (L) 08/14/2020 0704   HCT 30.3 (L) 08/14/2020 0704   PLT 185 08/14/2020 0704   MCV 91.5 08/14/2020 0704   MCH 29.3 08/14/2020 0704   MCHC 32.0 08/14/2020 0704   RDW 18.6 (H) 08/14/2020 0704   LYMPHSABS 2.3 06/06/2020 0418   MONOABS 0.9 06/06/2020 0418   EOSABS 0.0 06/06/2020 0418   BASOSABS 0.1 06/06/2020 0418   HEPATIC Function Panel Recent Labs    06/06/20 0418 07/19/20 0511 07/27/20 0433  PROT 6.8 4.8* 4.9*   HEMOGLOBIN A1C No components found for: HGA1C,  MPG CARDIAC ENZYMES Lab Results  Component Value Date   CKTOTAL 44 (L) 07/02/2020   BNP No results for input(s): PROBNP in the last 8760 hours. TSH Recent Labs    06/06/20 0418  TSH 3.257   CHOLESTEROL No results for input(s): CHOL in the last 8760 hours.  Scheduled Meds: Continuous Infusions: PRN Meds:.  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   LOS: 0 days   Time spent including chart review, lab review, examination, discussion with patient/Nurse :  30 min   Orpah Cobb  MD  08/16/2020, 2:13 PM

## 2020-08-17 DIAGNOSIS — J9621 Acute and chronic respiratory failure with hypoxia: Secondary | ICD-10-CM | POA: Diagnosis not present

## 2020-08-17 DIAGNOSIS — G901 Familial dysautonomia [Riley-Day]: Secondary | ICD-10-CM | POA: Diagnosis not present

## 2020-08-17 DIAGNOSIS — G610 Guillain-Barre syndrome: Secondary | ICD-10-CM | POA: Diagnosis not present

## 2020-08-17 DIAGNOSIS — I4891 Unspecified atrial fibrillation: Secondary | ICD-10-CM | POA: Diagnosis not present

## 2020-08-17 NOTE — Consult Note (Signed)
Ref: Elana Alm, MD   Subjective:  Episodes of hypotension. Diltiazem held last night.  Atrial fibrillation with RVR continues.  Objective:  Vital Signs in the last 24 hours: BP: 118/69, P: 118-140, R: 15, O2 sat: 99 % on 16 A/C and 35 % FiO2   Physical Exam: BP Readings from Last 1 Encounters:  05/06/19 124/79     Wt Readings from Last 1 Encounters:  04/04/14 101.2 kg    Weight change:  There is no height or weight on file to calculate BMI. HEENT: [place]/AT, Eyes-Blue, Conjunctiva-Pale pink, Sclera-Non-icteric Neck: No JVD, No bruit, Trachea midline. Lungs:  Clearing, Bilateral. Cardiac:  Irregular rhythm, normal S1 and S2, no S3. II/VI systolic murmur. Abdomen:  Soft, non-tender. BS present. Extremities:  No edema present. No cyanosis. No clubbing. CNS: AxOx1.  Skin: Warm and dry.   Intake/Output from previous day: No intake/output data recorded.    Lab Results: BMET    Component Value Date/Time   NA 138 08/14/2020 0704   NA 139 08/11/2020 0447   NA 139 08/08/2020 0412   K 3.9 08/15/2020 0924   K 3.4 (L) 08/14/2020 0704   K 3.9 08/11/2020 0447   CL 97 (L) 08/14/2020 0704   CL 99 08/11/2020 0447   CL 102 08/08/2020 0412   CO2 36 (H) 08/14/2020 0704   CO2 34 (H) 08/11/2020 0447   CO2 34 (H) 08/08/2020 0412   GLUCOSE 156 (H) 08/14/2020 0704   GLUCOSE 157 (H) 08/11/2020 0447   GLUCOSE 151 (H) 08/08/2020 0412   BUN 24 (H) 08/14/2020 0704   BUN 24 (H) 08/11/2020 0447   BUN 29 (H) 08/08/2020 0412   CREATININE <0.30 (L) 08/14/2020 0704   CREATININE <0.30 (L) 08/11/2020 0447   CREATININE <0.30 (L) 08/08/2020 0412   CALCIUM 9.3 08/14/2020 0704   CALCIUM 9.0 08/11/2020 0447   CALCIUM 8.6 (L) 08/08/2020 0412   GFRNONAA NOT CALCULATED 08/14/2020 0704   GFRNONAA NOT CALCULATED 08/11/2020 0447   GFRNONAA NOT CALCULATED 08/08/2020 0412   CBC    Component Value Date/Time   WBC 8.6 08/14/2020 0704   RBC 3.31 (L) 08/14/2020 0704   HGB 9.7 (L) 08/14/2020 0704    HCT 30.3 (L) 08/14/2020 0704   PLT 185 08/14/2020 0704   MCV 91.5 08/14/2020 0704   MCH 29.3 08/14/2020 0704   MCHC 32.0 08/14/2020 0704   RDW 18.6 (H) 08/14/2020 0704   LYMPHSABS 2.3 06/06/2020 0418   MONOABS 0.9 06/06/2020 0418   EOSABS 0.0 06/06/2020 0418   BASOSABS 0.1 06/06/2020 0418   HEPATIC Function Panel Recent Labs    06/06/20 0418 07/19/20 0511 07/27/20 0433  PROT 6.8 4.8* 4.9*   HEMOGLOBIN A1C No components found for: HGA1C,  MPG CARDIAC ENZYMES Lab Results  Component Value Date   CKTOTAL 44 (L) 07/02/2020   BNP No results for input(s): PROBNP in the last 8760 hours. TSH Recent Labs    06/06/20 0418  TSH 3.257   CHOLESTEROL No results for input(s): CHOL in the last 8760 hours.  Scheduled Meds: Continuous Infusions: PRN Meds:.  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   LOS: 0 days   Time spent including chart review, lab review, examination, discussion with patient/Nurse/Doctor : 30 min   Orpah Cobb  MD  08/17/2020, 3:32 PM

## 2020-08-18 DIAGNOSIS — J9621 Acute and chronic respiratory failure with hypoxia: Secondary | ICD-10-CM | POA: Diagnosis not present

## 2020-08-18 DIAGNOSIS — G610 Guillain-Barre syndrome: Secondary | ICD-10-CM | POA: Diagnosis not present

## 2020-08-18 DIAGNOSIS — G901 Familial dysautonomia [Riley-Day]: Secondary | ICD-10-CM | POA: Diagnosis not present

## 2020-08-18 DIAGNOSIS — I4891 Unspecified atrial fibrillation: Secondary | ICD-10-CM | POA: Diagnosis not present

## 2020-08-18 NOTE — Consult Note (Signed)
Ref: Elana Alm, MD   Subjective:  Episodes of bradycardia and hypotension. HR in 70's. Resting today post sedation.  Objective:  Vital Signs in the last 24 hours: BP: 76/58, P: 79, R: 30, O2 sat 100 %, FiO2 35 %, AC, TV 400 cc, PEEP 5  Physical Exam: BP Readings from Last 1 Encounters:  05/06/19 124/79     Wt Readings from Last 1 Encounters:  04/04/14 101.2 kg    Weight change:  There is no height or weight on file to calculate BMI. HEENT: [place]/AT, Eyes-Blue, Conjunctiva-Pale pink, Sclera-Non-icteric Neck: No JVD, No bruit, Tracheostomy in place. Lungs:  Clearing, Bilateral. Cardiac:  Regular rhythm, normal S1 and S2, no S3. II/VI systolic murmur. Abdomen:  Soft, non-tender. BS present. Extremities:  No edema present. No cyanosis. No clubbing. CNS: AxOx1,   Skin: Warm and dry.   Intake/Output from previous day: No intake/output data recorded.    Lab Results: BMET    Component Value Date/Time   NA 138 08/14/2020 0704   NA 139 08/11/2020 0447   NA 139 08/08/2020 0412   K 3.9 08/15/2020 0924   K 3.4 (L) 08/14/2020 0704   K 3.9 08/11/2020 0447   CL 97 (L) 08/14/2020 0704   CL 99 08/11/2020 0447   CL 102 08/08/2020 0412   CO2 36 (H) 08/14/2020 0704   CO2 34 (H) 08/11/2020 0447   CO2 34 (H) 08/08/2020 0412   GLUCOSE 156 (H) 08/14/2020 0704   GLUCOSE 157 (H) 08/11/2020 0447   GLUCOSE 151 (H) 08/08/2020 0412   BUN 24 (H) 08/14/2020 0704   BUN 24 (H) 08/11/2020 0447   BUN 29 (H) 08/08/2020 0412   CREATININE <0.30 (L) 08/14/2020 0704   CREATININE <0.30 (L) 08/11/2020 0447   CREATININE <0.30 (L) 08/08/2020 0412   CALCIUM 9.3 08/14/2020 0704   CALCIUM 9.0 08/11/2020 0447   CALCIUM 8.6 (L) 08/08/2020 0412   GFRNONAA NOT CALCULATED 08/14/2020 0704   GFRNONAA NOT CALCULATED 08/11/2020 0447   GFRNONAA NOT CALCULATED 08/08/2020 0412   CBC    Component Value Date/Time   WBC 8.6 08/14/2020 0704   RBC 3.31 (L) 08/14/2020 0704   HGB 9.7 (L) 08/14/2020 0704   HCT  30.3 (L) 08/14/2020 0704   PLT 185 08/14/2020 0704   MCV 91.5 08/14/2020 0704   MCH 29.3 08/14/2020 0704   MCHC 32.0 08/14/2020 0704   RDW 18.6 (H) 08/14/2020 0704   LYMPHSABS 2.3 06/06/2020 0418   MONOABS 0.9 06/06/2020 0418   EOSABS 0.0 06/06/2020 0418   BASOSABS 0.1 06/06/2020 0418   HEPATIC Function Panel Recent Labs    06/06/20 0418 07/19/20 0511 07/27/20 0433  PROT 6.8 4.8* 4.9*   HEMOGLOBIN A1C No components found for: HGA1C,  MPG CARDIAC ENZYMES Lab Results  Component Value Date   CKTOTAL 44 (L) 07/02/2020   BNP No results for input(s): PROBNP in the last 8760 hours. TSH Recent Labs    06/06/20 0418  TSH 3.257   CHOLESTEROL No results for input(s): CHOL in the last 8760 hours.  Scheduled Meds: Continuous Infusions: PRN Meds:.  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 Decrease diltiazem as needed.   LOS: 0 days   Time spent including chart review, lab review, examination, discussion with patient/Nurse/Doctor : 30 min   Orpah Cobb  MD  08/18/2020, 10:50  AM     

## 2020-08-18 NOTE — Progress Notes (Signed)
Pulmonary Critical Care Medicine [facility] [place]   PULMONARY CRITICAL CARE SERVICE  PROGRESS NOTE     Brian Espinoza  TDS:[id number]  DOB: [birth year]   DOA: 06/05/2020  Referring Physician: Carron Curie, MD  HPI: Brian Espinoza is a 65 y.o. male seen for follow up of Acute on Chronic Respiratory Failure.  Patient is afebrile right now comfortable without distress has been still having issues with tachybradycardia  Medications: Reviewed on Rounds  Physical Exam:  Vitals: Temperature is 97.0 pulse 87 respiratory 18 blood pressure 92/64 saturations 100%  Ventilator Settings on assist control FiO2 35% tidal volume 500 PEEP 5  . General: Comfortable at this time . Eyes: Grossly normal lids, irises & conjunctiva . ENT: grossly tongue is normal . Neck: no obvious mass . Cardiovascular: S1 S2 normal no gallop . Respiratory: Scattered rhonchi expansion is equal at this time . Abdomen: soft . Skin: no rash seen on limited exam . Musculoskeletal: not rigid . Psychiatric:unable to assess . Neurologic: no seizure no involuntary movements         Lab Data:   Basic Metabolic Panel: Recent Labs  Lab 08/14/20 0704 08/15/20 0924  NA 138  --   K 3.4* 3.9  CL 97*  --   CO2 36*  --   GLUCOSE 156*  --   BUN 24*  --   CREATININE <0.30*  --   CALCIUM 9.3  --   MG 2.0  --     ABG: No results for input(s): PHART, PCO2ART, PO2ART, HCO3, O2SAT in the last 168 hours.  Liver Function Tests: No results for input(s): AST, ALT, ALKPHOS, BILITOT, PROT, ALBUMIN in the last 168 hours. No results for input(s): LIPASE, AMYLASE in the last 168 hours. No results for input(s): AMMONIA in the last 168 hours.  CBC: Recent Labs  Lab 08/14/20 0704  WBC 8.6  HGB 9.7*  HCT 30.3*  MCV 91.5  PLT 185    Cardiac Enzymes: No results for input(s): CKTOTAL, CKMB, CKMBINDEX, TROPONINI in the last 168 hours.  BNP (last 3 results) No results for input(s): BNP in the last 8760  hours.  ProBNP (last 3 results) No results for input(s): PROBNP in the last 8760 hours.  Radiological Exams: No results found.  Assessment/Plan Active Problems:   Acute on chronic respiratory failure with hypoxia (HCC)   Acute inflammatory demyelinating polyneuropathy (HCC)   Dysautonomia (HCC)   Atrial fibrillation with RVR (HCC)   Healthcare associated bacterial pneumonia   1. Acute on chronic respiratory failure hypoxia we will continue with assist control mode titrate oxygen continue pulmonary toilet. 2. Acute inflammatory demyelinating polyneuropathy continue with supportive care. 3. Dysautonomia no change 4. Atrial fibrillation rate is controlled 5. Healthcare associated pneumonia has been treated improving   I have personally seen and evaluated the patient, evaluated laboratory and imaging results, formulated the assessment and plan and placed orders. The Patient requires high complexity decision making with multiple systems involvement.  Rounds were done with the Respiratory Therapy Director and Staff therapists and discussed with nursing staff also.  Yevonne Pax, MD [facility] Pulmonary Critical Care Medicine Sleep Medicine

## 2020-08-18 NOTE — Progress Notes (Signed)
Pulmonary Critical Care Medicine [facility] [place]   PULMONARY CRITICAL CARE SERVICE  PROGRESS NOTE  Date of Service: 08/18/2020   Brian Espinoza  JJH:[id number]  DOB: [birth year]   DOA: 06/05/2020  Referring Physician: Carron Curie, MD  HPI: Brian Espinoza is a 65 y.o. male seen for follow up of Acute on Chronic Respiratory Failure.  On the ventilator and full support right now  Medications: Reviewed on Rounds  Physical Exam:  Vitals: Temperature is 98.0 pulse 79 respiratory rate 30 blood pressure is 76/88 saturations 100%  Ventilator Settings assist control FiO2 35% tidal volume 400 PEEP 5  . General: Comfortable at this time . Eyes: Grossly normal lids, irises & conjunctiva . ENT: grossly tongue is normal . Neck: no obvious mass . Cardiovascular: S1 S2 normal no gallop . Respiratory: Coarse breath sounds with a few scattered rhonchi . Abdomen: soft . Skin: no rash seen on limited exam . Musculoskeletal: not rigid . Psychiatric:unable to assess . Neurologic: no seizure no involuntary movements         Lab Data:   Basic Metabolic Panel: Recent Labs  Lab 08/14/20 0704 08/15/20 0924  NA 138  --   K 3.4* 3.9  CL 97*  --   CO2 36*  --   GLUCOSE 156*  --   BUN 24*  --   CREATININE <0.30*  --   CALCIUM 9.3  --   MG 2.0  --     ABG: No results for input(s): PHART, PCO2ART, PO2ART, HCO3, O2SAT in the last 168 hours.  Liver Function Tests: No results for input(s): AST, ALT, ALKPHOS, BILITOT, PROT, ALBUMIN in the last 168 hours. No results for input(s): LIPASE, AMYLASE in the last 168 hours. No results for input(s): AMMONIA in the last 168 hours.  CBC: Recent Labs  Lab 08/14/20 0704  WBC 8.6  HGB 9.7*  HCT 30.3*  MCV 91.5  PLT 185    Cardiac Enzymes: No results for input(s): CKTOTAL, CKMB, CKMBINDEX, TROPONINI in the last 168 hours.  BNP (last 3 results) No results for input(s): BNP in the last 8760 hours.  ProBNP (last 3 results) No results  for input(s): PROBNP in the last 8760 hours.  Radiological Exams: No results found.  Assessment/Plan Active Problems:   Acute on chronic respiratory failure with hypoxia (HCC)   Acute inflammatory demyelinating polyneuropathy (HCC)   Dysautonomia (HCC)   Atrial fibrillation with RVR (HCC)   Healthcare associated bacterial pneumonia   1. Acute on chronic respiratory failure hypoxia patient is on full support on the ventilator right now.  We will continue to assess the RSB and mechanics.  Cardiac issues have been the main issue. 2. Acute inflammatory demyelinating polyneuropathy no change we will continue with supportive care. 3. Dysautonomia patient is at baseline 4. Atrial fibrillation RVR rate controlled 5. Healthcare associated pneumonia has been treated we will continue to follow along.   I have personally seen and evaluated the patient, evaluated laboratory and imaging results, formulated the assessment and plan and placed orders. The Patient requires high complexity decision making with multiple systems involvement.  Rounds were done with the Respiratory Therapy Director and Staff therapists and discussed with nursing staff also.  Yevonne Pax, MD [facility] Pulmonary Critical Care Medicine Sleep Medicine

## 2020-08-19 ENCOUNTER — Other Ambulatory Visit (HOSPITAL_COMMUNITY): Payer: Medicare Other

## 2020-08-19 DIAGNOSIS — J9621 Acute and chronic respiratory failure with hypoxia: Secondary | ICD-10-CM | POA: Diagnosis not present

## 2020-08-19 DIAGNOSIS — G610 Guillain-Barre syndrome: Secondary | ICD-10-CM | POA: Diagnosis not present

## 2020-08-19 DIAGNOSIS — I4891 Unspecified atrial fibrillation: Secondary | ICD-10-CM | POA: Diagnosis not present

## 2020-08-19 DIAGNOSIS — G901 Familial dysautonomia [Riley-Day]: Secondary | ICD-10-CM | POA: Diagnosis not present

## 2020-08-19 LAB — CBC
HCT: 31.1 % — ABNORMAL LOW (ref 39.0–52.0)
Hemoglobin: 9.4 g/dL — ABNORMAL LOW (ref 13.0–17.0)
MCH: 28.7 pg (ref 26.0–34.0)
MCHC: 30.2 g/dL (ref 30.0–36.0)
MCV: 94.8 fL (ref 80.0–100.0)
Platelets: 166 10*3/uL (ref 150–400)
RBC: 3.28 MIL/uL — ABNORMAL LOW (ref 4.22–5.81)
RDW: 18.2 % — ABNORMAL HIGH (ref 11.5–15.5)
WBC: 7.2 10*3/uL (ref 4.0–10.5)
nRBC: 0.3 % — ABNORMAL HIGH (ref 0.0–0.2)

## 2020-08-19 LAB — MAGNESIUM: Magnesium: 2 mg/dL (ref 1.7–2.4)

## 2020-08-19 LAB — BASIC METABOLIC PANEL
Anion gap: 8 (ref 5–15)
BUN: 25 mg/dL — ABNORMAL HIGH (ref 8–23)
CO2: 32 mmol/L (ref 22–32)
Calcium: 9.2 mg/dL (ref 8.9–10.3)
Chloride: 103 mmol/L (ref 98–111)
Creatinine, Ser: <0.3 mg/dL — ABNORMAL LOW (ref 0.61–1.24)
Glucose, Bld: 143 mg/dL — ABNORMAL HIGH (ref 70–99)
Potassium: 3.7 mmol/L (ref 3.5–5.1)
Sodium: 143 mmol/L (ref 135–145)

## 2020-08-19 NOTE — Progress Notes (Signed)
Pulmonary Critical Care Medicine [facility] [place]   PULMONARY CRITICAL CARE SERVICE  PROGRESS NOTE     Johnavon Mcclafferty  YQM:[id number]  DOB: 10-Oct-1955   DOA: 06/05/2020  Referring Physician: Carron Curie, MD  HPI: Raeford Brandenburg is a 65 y.o. male seen for follow up of Acute on Chronic Respiratory Failure.  Patient currently is on pressure support will be doing 8 hours  Medications: Reviewed on Rounds  Physical Exam:  Vitals: Temperature is 96.7 pulse 83 respiratory 23 blood pressure 95/52 saturations 100%  Ventilator Settings on pressure support 12/5 FiO2 35%  . General: Comfortable at this time . Eyes: Grossly normal lids, irises & conjunctiva . ENT: grossly tongue is normal . Neck: no obvious mass . Cardiovascular: S1 S2 normal no gallop . Respiratory: No rhonchi no rales are noted at this time . Abdomen: soft . Skin: no rash seen on limited exam . Musculoskeletal: not rigid . Psychiatric:unable to assess . Neurologic: no seizure no involuntary movements         Lab Data:   Basic Metabolic Panel: Recent Labs  Lab 08/14/20 0704 08/15/20 0924 08/19/20 0239  NA 138  --  143  K 3.4* 3.9 3.7  CL 97*  --  103  CO2 36*  --  32  GLUCOSE 156*  --  143*  BUN 24*  --  25*  CREATININE <0.30*  --  <0.30*  CALCIUM 9.3  --  9.2  MG 2.0  --  2.0    ABG: No results for input(s): PHART, PCO2ART, PO2ART, HCO3, O2SAT in the last 168 hours.  Liver Function Tests: No results for input(s): AST, ALT, ALKPHOS, BILITOT, PROT, ALBUMIN in the last 168 hours. No results for input(s): LIPASE, AMYLASE in the last 168 hours. No results for input(s): AMMONIA in the last 168 hours.  CBC: Recent Labs  Lab 08/14/20 0704 08/19/20 0239  WBC 8.6 7.2  HGB 9.7* 9.4*  HCT 30.3* 31.1*  MCV 91.5 94.8  PLT 185 166    Cardiac Enzymes: No results for input(s): CKTOTAL, CKMB, CKMBINDEX, TROPONINI in the last 168 hours.  BNP (last 3 results) No results for input(s): BNP in the  last 8760 hours.  ProBNP (last 3 results) No results for input(s): PROBNP in the last 8760 hours.  Radiological Exams: DG CHEST PORT 1 VIEW  Result Date: 08/19/2020 CLINICAL DATA:  Respiratory failure EXAM: PORTABLE CHEST 1 VIEW COMPARISON:  08/14/2020 FINDINGS: Tracheostomy in satisfactory position. Mild bibasilar atelectasis. Increased interstitial markings without frank interstitial edema. No pneumothorax. The heart is top-normal in size. IMPRESSION: Mild bibasilar atelectasis.  Tracheostomy in satisfactory position. Electronically Signed   By: Charline Bills M.D.   On: 08/19/2020 06:52    Assessment/Plan Active Problems:   Acute on chronic respiratory failure with hypoxia (HCC)   Acute inflammatory demyelinating polyneuropathy (HCC)   Dysautonomia (HCC)   Atrial fibrillation with RVR (HCC)   Healthcare associated bacterial pneumonia   1. Acute on chronic respiratory failure hypoxia we will continue with pressure support patient not able to tolerate further weaning.  We will continue to assess however 2. Dysautonomia cardiology continues to follow along 3. Atrial fibrillation rate has been waxing waning 4. Healthcare associated pneumonia treated with antibiotics. 5. Acute inflammatory demyelinating polyneuropathy no change we will continue to monitor closely.   I have personally seen and evaluated the patient, evaluated laboratory and imaging results, formulated the assessment and plan and placed orders. The Patient requires high complexity decision making with multiple  systems involvement.  Rounds were done with the Respiratory Therapy Director and Staff therapists and discussed with nursing staff also.  Allyne Gee, MD [facility] Pulmonary Critical Care Medicine Sleep Medicine

## 2020-08-20 DIAGNOSIS — G901 Familial dysautonomia [Riley-Day]: Secondary | ICD-10-CM | POA: Diagnosis not present

## 2020-08-20 DIAGNOSIS — J9621 Acute and chronic respiratory failure with hypoxia: Secondary | ICD-10-CM | POA: Diagnosis not present

## 2020-08-20 DIAGNOSIS — I4891 Unspecified atrial fibrillation: Secondary | ICD-10-CM | POA: Diagnosis not present

## 2020-08-20 DIAGNOSIS — G610 Guillain-Barre syndrome: Secondary | ICD-10-CM | POA: Diagnosis not present

## 2020-08-20 NOTE — Progress Notes (Signed)
Pulmonary Critical Care Medicine [facility] [place]   PULMONARY CRITICAL CARE SERVICE  PROGRESS NOTE     Brian Espinoza  [id number]:[id number]  DOB: [birth year]   DOA: 06/05/2020  Referring Physician: Carron Curie, MD  HPI: Brian Espinoza is a 65 y.o. male seen for follow up of Acute on Chronic Respiratory Failure.  Patient is comfortable right now without distress has been on pressure support 35%  Medications: Reviewed on Rounds  Physical Exam:  Vitals: Temperature is 97.0 pulse 75 respiratory 36 blood pressure 108/67 saturations 100%  Ventilator Settings on the ventilator on pressure support goal of 12 hours  . General: Comfortable at this time . Eyes: Grossly normal lids, irises & conjunctiva . ENT: grossly tongue is normal . Neck: no obvious mass . Cardiovascular: S1 S2 normal no gallop . Respiratory: No rhonchi very coarse breath sounds . Abdomen: soft . Skin: no rash seen on limited exam . Musculoskeletal: not rigid . Psychiatric:unable to assess . Neurologic: no seizure no involuntary movements         Lab Data:   Basic Metabolic Panel: Recent Labs  Lab 08/14/20 0704 08/15/20 0924 08/19/20 0239  NA 138  --  143  K 3.4* 3.9 3.7  CL 97*  --  103  CO2 36*  --  32  GLUCOSE 156*  --  143*  BUN 24*  --  25*  CREATININE <0.30*  --  <0.30*  CALCIUM 9.3  --  9.2  MG 2.0  --  2.0    ABG: No results for input(s): PHART, PCO2ART, PO2ART, HCO3, O2SAT in the last 168 hours.  Liver Function Tests: No results for input(s): AST, ALT, ALKPHOS, BILITOT, PROT, ALBUMIN in the last 168 hours. No results for input(s): LIPASE, AMYLASE in the last 168 hours. No results for input(s): AMMONIA in the last 168 hours.  CBC: Recent Labs  Lab 08/14/20 0704 08/19/20 0239  WBC 8.6 7.2  HGB 9.7* 9.4*  HCT 30.3* 31.1*  MCV 91.5 94.8  PLT 185 166    Cardiac Enzymes: No results for input(s): CKTOTAL, CKMB, CKMBINDEX, TROPONINI in the last 168 hours.  BNP (last 3  results) No results for input(s): BNP in the last 8760 hours.  ProBNP (last 3 results) No results for input(s): PROBNP in the last 8760 hours.  Radiological Exams: DG CHEST PORT 1 VIEW  Result Date: 08/19/2020 CLINICAL DATA:  Respiratory failure EXAM: PORTABLE CHEST 1 VIEW COMPARISON:  08/14/2020 FINDINGS: Tracheostomy in satisfactory position. Mild bibasilar atelectasis. Increased interstitial markings without frank interstitial edema. No pneumothorax. The heart is top-normal in size. IMPRESSION: Mild bibasilar atelectasis.  Tracheostomy in satisfactory position. Electronically Signed   By: Charline Bills M.D.   On: 08/19/2020 06:52    Assessment/Plan Active Problems:   Acute on chronic respiratory failure with hypoxia (HCC)   Acute inflammatory demyelinating polyneuropathy (HCC)   Dysautonomia (HCC)   Atrial fibrillation with RVR (HCC)   Healthcare associated bacterial pneumonia   1. Acute on chronic respiratory failure hypoxia plan is to wean on pressure support goal of 12 hours on a pressure of 12/5 2. Acute inflammatory demyelinating polyneuropathy no change we will continue with supportive care. 3. Dysautonomia no change continue present management 4. Atrial fibrillation rate is controlled 5. Healthcare associated pneumonia treated we will continue to monitor closely.   I have personally seen and evaluated the patient, evaluated laboratory and imaging results, formulated the assessment and plan and placed orders. The Patient requires high complexity decision making with  multiple systems involvement.  Rounds were done with the Respiratory Therapy Director and Staff therapists and discussed with nursing staff also.  Allyne Gee, MD [facility] Pulmonary Critical Care Medicine Sleep Medicine

## 2020-08-21 DIAGNOSIS — G610 Guillain-Barre syndrome: Secondary | ICD-10-CM | POA: Diagnosis not present

## 2020-08-21 DIAGNOSIS — J9621 Acute and chronic respiratory failure with hypoxia: Secondary | ICD-10-CM | POA: Diagnosis not present

## 2020-08-21 DIAGNOSIS — I4891 Unspecified atrial fibrillation: Secondary | ICD-10-CM | POA: Diagnosis not present

## 2020-08-21 DIAGNOSIS — G901 Familial dysautonomia [Riley-Day]: Secondary | ICD-10-CM | POA: Diagnosis not present

## 2020-08-21 LAB — CULTURE, RESPIRATORY W GRAM STAIN

## 2020-08-21 NOTE — Progress Notes (Signed)
Pulmonary Critical Care Medicine [facility] [place]   PULMONARY CRITICAL CARE SERVICE  PROGRESS NOTE     Brian Espinoza  OIN:[id number]  DOB: 03/25/1956   DOA: 06/05/2020  Referring Physician: Carron Curie, MD  HPI: Brian Espinoza is a 65 y.o. male seen for follow up of Acute on Chronic Respiratory Failure.  Patient is comfortable right now without distress has been on assist control mode on 35% FiO2 good saturations are noted  Medications: Reviewed on Rounds  Physical Exam:  Vitals: Temperature is 97.0 pulse 100 respiratory 18 blood pressure is 127/85 saturations 100%  Ventilator Settings on assist control FiO2 35% tidal volume 400 PEEP 5  . General: Comfortable at this time . Eyes: Grossly normal lids, irises & conjunctiva . ENT: grossly tongue is normal . Neck: no obvious mass . Cardiovascular: S1 S2 normal no gallop . Respiratory: Scattered rhonchi expansion is equal at this time . Abdomen: soft . Skin: no rash seen on limited exam . Musculoskeletal: not rigid . Psychiatric:unable to assess . Neurologic: no seizure no involuntary movements         Lab Data:   Basic Metabolic Panel: Recent Labs  Lab 08/15/20 0924 08/19/20 0239  NA  --  143  K 3.9 3.7  CL  --  103  CO2  --  32  GLUCOSE  --  143*  BUN  --  25*  CREATININE  --  <0.30*  CALCIUM  --  9.2  MG  --  2.0    ABG: No results for input(s): PHART, PCO2ART, PO2ART, HCO3, O2SAT in the last 168 hours.  Liver Function Tests: No results for input(s): AST, ALT, ALKPHOS, BILITOT, PROT, ALBUMIN in the last 168 hours. No results for input(s): LIPASE, AMYLASE in the last 168 hours. No results for input(s): AMMONIA in the last 168 hours.  CBC: Recent Labs  Lab 08/19/20 0239  WBC 7.2  HGB 9.4*  HCT 31.1*  MCV 94.8  PLT 166    Cardiac Enzymes: No results for input(s): CKTOTAL, CKMB, CKMBINDEX, TROPONINI in the last 168 hours.  BNP (last 3 results) No results for input(s): BNP in the last 8760  hours.  ProBNP (last 3 results) No results for input(s): PROBNP in the last 8760 hours.  Radiological Exams: No results found.  Assessment/Plan Active Problems:   Acute on chronic respiratory failure with hypoxia (HCC)   Acute inflammatory demyelinating polyneuropathy (HCC)   Dysautonomia (HCC)   Atrial fibrillation with RVR (HCC)   Healthcare associated bacterial pneumonia   1. Acute on chronic respiratory failure hypoxia plan is to continue with assist control mode on 35% FiO2.  Patient's mechanics have been poor not weaning right now 2. Acute inflammatory demyelinating polyneuropathy we will continue to follow 3. Atrial fibrillation patient's heart rate waxing waning being seen by cardiology 4. Dysautonomia also waxing and waning 5. Healthcare associated pneumonia supportive care   I have personally seen and evaluated the patient, evaluated laboratory and imaging results, formulated the assessment and plan and placed orders. The Patient requires high complexity decision making with multiple systems involvement.  Rounds were done with the Respiratory Therapy Director and Staff therapists and discussed with nursing staff also.  Yevonne Pax, MD [facility] Pulmonary Critical Care Medicine Sleep Medicine

## 2020-08-22 DIAGNOSIS — G610 Guillain-Barre syndrome: Secondary | ICD-10-CM | POA: Diagnosis not present

## 2020-08-22 DIAGNOSIS — J9621 Acute and chronic respiratory failure with hypoxia: Secondary | ICD-10-CM | POA: Diagnosis not present

## 2020-08-22 DIAGNOSIS — G901 Familial dysautonomia [Riley-Day]: Secondary | ICD-10-CM | POA: Diagnosis not present

## 2020-08-22 DIAGNOSIS — I4891 Unspecified atrial fibrillation: Secondary | ICD-10-CM | POA: Diagnosis not present

## 2020-08-22 NOTE — Progress Notes (Signed)
Pulmonary Critical Care Medicine [facility] [place]   PULMONARY CRITICAL CARE SERVICE  PROGRESS NOTE     Brian Espinoza  YHC:[id number]  DOB: 1955-07-14   DOA: 06/05/2020  Referring Physician: Carron Curie, MD  HPI: Brian Espinoza is a 65 y.o. male seen for follow up of Acute on Chronic Respiratory Failure.  Patient remains on pressure support right now with goal of up to 16 hours  Medications: Reviewed on Rounds  Physical Exam:  Vitals: Temperature is 98.0 pulse 82 respiratory 24 blood pressure is 131/70 saturations 100%  Ventilator Settings on pressure support FiO2 45% pressure 12/5  . General: Comfortable at this time . Eyes: Grossly normal lids, irises & conjunctiva . ENT: grossly tongue is normal . Neck: no obvious mass . Cardiovascular: S1 S2 normal no gallop . Respiratory: Scattered rhonchi expansion is equal at this time . Abdomen: soft . Skin: no rash seen on limited exam . Musculoskeletal: not rigid . Psychiatric:unable to assess . Neurologic: no seizure no involuntary movements         Lab Data:   Basic Metabolic Panel: Recent Labs  Lab 08/19/20 0239  NA 143  K 3.7  CL 103  CO2 32  GLUCOSE 143*  BUN 25*  CREATININE <0.30*  CALCIUM 9.2  MG 2.0    ABG: No results for input(s): PHART, PCO2ART, PO2ART, HCO3, O2SAT in the last 168 hours.  Liver Function Tests: No results for input(s): AST, ALT, ALKPHOS, BILITOT, PROT, ALBUMIN in the last 168 hours. No results for input(s): LIPASE, AMYLASE in the last 168 hours. No results for input(s): AMMONIA in the last 168 hours.  CBC: Recent Labs  Lab 08/19/20 0239  WBC 7.2  HGB 9.4*  HCT 31.1*  MCV 94.8  PLT 166    Cardiac Enzymes: No results for input(s): CKTOTAL, CKMB, CKMBINDEX, TROPONINI in the last 168 hours.  BNP (last 3 results) No results for input(s): BNP in the last 8760 hours.  ProBNP (last 3 results) No results for input(s): PROBNP in the last 8760 hours.  Radiological  Exams: No results found.  Assessment/Plan Active Problems:   Acute on chronic respiratory failure with hypoxia (HCC)   Acute inflammatory demyelinating polyneuropathy (HCC)   Dysautonomia (HCC)   Atrial fibrillation with RVR (HCC)   Healthcare associated bacterial pneumonia   1. Acute on chronic respiratory failure with hypoxia we will continue with the pressure support goal of 16 hours. 2. Dysautonomia we will continue to monitor the patient's blood pressure and heart rhythm 3. Atrial fibrillation rate is controlled 4. Acute inflammatory demyelinating polyneuropathy no change we will continue to monitor 5. Healthcare associated pneumonia treated slowly improving.   I have personally seen and evaluated the patient, evaluated laboratory and imaging results, formulated the assessment and plan and placed orders. The Patient requires high complexity decision making with multiple systems involvement.  Rounds were done with the Respiratory Therapy Director and Staff therapists and discussed with nursing staff also.  Yevonne Pax, MD [facility] Pulmonary Critical Care Medicine Sleep Medicine

## 2020-08-23 DIAGNOSIS — I4891 Unspecified atrial fibrillation: Secondary | ICD-10-CM | POA: Diagnosis not present

## 2020-08-23 DIAGNOSIS — J9621 Acute and chronic respiratory failure with hypoxia: Secondary | ICD-10-CM | POA: Diagnosis not present

## 2020-08-23 DIAGNOSIS — G610 Guillain-Barre syndrome: Secondary | ICD-10-CM | POA: Diagnosis not present

## 2020-08-23 DIAGNOSIS — G901 Familial dysautonomia [Riley-Day]: Secondary | ICD-10-CM | POA: Diagnosis not present

## 2020-08-23 NOTE — Progress Notes (Signed)
Pulmonary Critical Care Medicine [facility] [place]   PULMONARY CRITICAL CARE SERVICE  PROGRESS NOTE     Brian Espinoza  UDJ:[id number]  DOB: 1955/10/10   DOA: 06/05/2020  Referring Physician: Carron Curie, MD  HPI: Brian Espinoza is a 65 y.o. male seen for follow up of Acute on Chronic Respiratory Failure.  Patient has no fevers noted appears to be comfortable remains on the ventilator and full support  Medications: Reviewed on Rounds  Physical Exam:  Vitals: Temperature is 96.3 pulse 88 respiratory 19 blood pressure is 117/86 saturations 96%  Ventilator Settings on assist control FiO2 is 45% PEEP 5 tidal volume 400  . General: Comfortable at this time . Eyes: Grossly normal lids, irises & conjunctiva . ENT: grossly tongue is normal . Neck: no obvious mass . Cardiovascular: S1 S2 normal no gallop . Respiratory: Scattered rhonchi expansion is equal . Abdomen: soft . Skin: no rash seen on limited exam . Musculoskeletal: not rigid . Psychiatric:unable to assess . Neurologic: no seizure no involuntary movements         Lab Data:   Basic Metabolic Panel: Recent Labs  Lab 08/19/20 0239  NA 143  K 3.7  CL 103  CO2 32  GLUCOSE 143*  BUN 25*  CREATININE <0.30*  CALCIUM 9.2  MG 2.0    ABG: No results for input(s): PHART, PCO2ART, PO2ART, HCO3, O2SAT in the last 168 hours.  Liver Function Tests: No results for input(s): AST, ALT, ALKPHOS, BILITOT, PROT, ALBUMIN in the last 168 hours. No results for input(s): LIPASE, AMYLASE in the last 168 hours. No results for input(s): AMMONIA in the last 168 hours.  CBC: Recent Labs  Lab 08/19/20 0239  WBC 7.2  HGB 9.4*  HCT 31.1*  MCV 94.8  PLT 166    Cardiac Enzymes: No results for input(s): CKTOTAL, CKMB, CKMBINDEX, TROPONINI in the last 168 hours.  BNP (last 3 results) No results for input(s): BNP in the last 8760 hours.  ProBNP (last 3 results) No results for input(s): PROBNP in the last 8760  hours.  Radiological Exams: No results found.  Assessment/Plan Active Problems:   Acute on chronic respiratory failure with hypoxia (HCC)   Acute inflammatory demyelinating polyneuropathy (HCC)   Dysautonomia (HCC)   Atrial fibrillation with RVR (HCC)   Healthcare associated bacterial pneumonia   1. Acute on chronic respiratory failure hypoxia plan is to continue with assist control mode on 45% FiO2 PEEP 5 tidal volume 400. 2. Acute inflammatory demyelinating polyneuropathy no change continue to monitor closely. 3. Dysautonomia no change supportive care 4. Atrial fibrillation rate is controlled at this time we will continue to follow 5. Healthcare associated pneumonia secondary to bacterial has been treated with antibiotic   I have personally seen and evaluated the patient, evaluated laboratory and imaging results, formulated the assessment and plan and placed orders. The Patient requires high complexity decision making with multiple systems involvement.  Rounds were done with the Respiratory Therapy Director and Staff therapists and discussed with nursing staff also.  Yevonne Pax, MD [facility] Pulmonary Critical Care Medicine Sleep Medicine

## 2020-08-24 DIAGNOSIS — I4891 Unspecified atrial fibrillation: Secondary | ICD-10-CM | POA: Diagnosis not present

## 2020-08-24 DIAGNOSIS — G901 Familial dysautonomia [Riley-Day]: Secondary | ICD-10-CM | POA: Diagnosis not present

## 2020-08-24 DIAGNOSIS — J9621 Acute and chronic respiratory failure with hypoxia: Secondary | ICD-10-CM | POA: Diagnosis not present

## 2020-08-24 DIAGNOSIS — G610 Guillain-Barre syndrome: Secondary | ICD-10-CM | POA: Diagnosis not present

## 2020-08-24 LAB — CBC
HCT: 32.6 % — ABNORMAL LOW (ref 39.0–52.0)
Hemoglobin: 10.1 g/dL — ABNORMAL LOW (ref 13.0–17.0)
MCH: 29.1 pg (ref 26.0–34.0)
MCHC: 31 g/dL (ref 30.0–36.0)
MCV: 93.9 fL (ref 80.0–100.0)
Platelets: 152 10*3/uL (ref 150–400)
RBC: 3.47 MIL/uL — ABNORMAL LOW (ref 4.22–5.81)
RDW: 17.9 % — ABNORMAL HIGH (ref 11.5–15.5)
WBC: 8.9 10*3/uL (ref 4.0–10.5)
nRBC: 0.2 % (ref 0.0–0.2)

## 2020-08-24 LAB — BASIC METABOLIC PANEL
Anion gap: 5 (ref 5–15)
BUN: 13 mg/dL (ref 8–23)
CO2: 35 mmol/L — ABNORMAL HIGH (ref 22–32)
Calcium: 9 mg/dL (ref 8.9–10.3)
Chloride: 100 mmol/L (ref 98–111)
Creatinine, Ser: <0.3 mg/dL — ABNORMAL LOW (ref 0.61–1.24)
Glucose, Bld: 135 mg/dL — ABNORMAL HIGH (ref 70–99)
Potassium: 3.4 mmol/L — ABNORMAL LOW (ref 3.5–5.1)
Sodium: 140 mmol/L (ref 135–145)

## 2020-08-24 LAB — LEVETIRACETAM LEVEL: Levetiracetam Lvl: <1 ug/mL — ABNORMAL LOW (ref 10.0–40.0)

## 2020-08-24 LAB — MAGNESIUM: Magnesium: 2.1 mg/dL (ref 1.7–2.4)

## 2020-08-24 NOTE — Consult Note (Signed)
Ref: Elana Alm, MD   Subjective:  Converted to sinus rhythm.  Awake and sitting up in chair with 30 degree recline.  Objective:  Vital Signs in the last 24 hours: BP: 105/59, P: 60, R: 30, O2 sat 100 %, T: 98.6 F, 40 % FiO2 and spontaneous breathing. Monitor: Sinus rhythm with APCs.  Physical Exam: BP Readings from Last 1 Encounters:  05/06/19 124/79     Wt Readings from Last 1 Encounters:  04/04/14 101.2 kg    Weight change:  There is no height or weight on file to calculate BMI. HEENT: [place]/AT, Eyes-Blue, PERL, EOMI, Conjunctiva-Pale pink, Sclera-Non-icteric Neck: No JVD, No bruit, Tracheostomy present. Lungs:  Clearing, Bilateral. Cardiac:  Regular rhythm, normal S1 and S2, no S3. II/VI systolic murmur. Abdomen:  Soft, non-tender. BS present. Extremities:  No edema present. No cyanosis. No clubbing. CNS: AxOx1.  Skin: Warm and dry.   Intake/Output from previous day: No intake/output data recorded.    Lab Results: BMET    Component Value Date/Time   NA 140 08/24/2020 0449   NA 143 08/19/2020 0239   NA 138 08/14/2020 0704   K 3.4 (L) 08/24/2020 0449   K 3.7 08/19/2020 0239   K 3.9 08/15/2020 0924   CL 100 08/24/2020 0449   CL 103 08/19/2020 0239   CL 97 (L) 08/14/2020 0704   CO2 35 (H) 08/24/2020 0449   CO2 32 08/19/2020 0239   CO2 36 (H) 08/14/2020 0704   GLUCOSE 135 (H) 08/24/2020 0449   GLUCOSE 143 (H) 08/19/2020 0239   GLUCOSE 156 (H) 08/14/2020 0704   BUN 13 08/24/2020 0449   BUN 25 (H) 08/19/2020 0239   BUN 24 (H) 08/14/2020 0704   CREATININE <0.30 (L) 08/24/2020 0449   CREATININE <0.30 (L) 08/19/2020 0239   CREATININE <0.30 (L) 08/14/2020 0704   CALCIUM 9.0 08/24/2020 0449   CALCIUM 9.2 08/19/2020 0239   CALCIUM 9.3 08/14/2020 0704   GFRNONAA NOT CALCULATED 08/24/2020 0449   GFRNONAA NOT CALCULATED 08/19/2020 0239   GFRNONAA NOT CALCULATED 08/14/2020 0704   CBC    Component Value Date/Time   WBC 8.9 08/24/2020 0449   RBC 3.47 (L)  08/24/2020 0449   HGB 10.1 (L) 08/24/2020 0449   HCT 32.6 (L) 08/24/2020 0449   PLT 152 08/24/2020 0449   MCV 93.9 08/24/2020 0449   MCH 29.1 08/24/2020 0449   MCHC 31.0 08/24/2020 0449   RDW 17.9 (H) 08/24/2020 0449   LYMPHSABS 2.3 06/06/2020 0418   MONOABS 0.9 06/06/2020 0418   EOSABS 0.0 06/06/2020 0418   BASOSABS 0.1 06/06/2020 0418   HEPATIC Function Panel Recent Labs    06/06/20 0418 07/19/20 0511 07/27/20 0433  PROT 6.8 4.8* 4.9*   HEMOGLOBIN A1C No components found for: HGA1C,  MPG CARDIAC ENZYMES Lab Results  Component Value Date   CKTOTAL 44 (L) 07/02/2020   BNP No results for input(s): PROBNP in the last 8760 hours. TSH Recent Labs    06/06/20 0418  TSH 3.257   CHOLESTEROL No results for input(s): CHOL in the last 8760 hours.  Scheduled Meds: Continuous Infusions: PRN Meds:.  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 Decrease amiodarone to 200 mg. Daily.   LOS: 0 days   Time spent including chart review, lab review, examination, discussion with patient/Wife/Doctor/Nurse : 35 min   Orpah Cobb  MD  08/24/2020,  2:34 PM

## 2020-08-24 NOTE — Progress Notes (Signed)
Pulmonary Critical Care Medicine [facility] [place]   PULMONARY CRITICAL CARE SERVICE  PROGRESS NOTE     Brian Espinoza  WCH:[id number]  DOB: 06-20-1955   DOA: 06/05/2020  Referring Physician: Carron Curie, MD  HPI: Brian Espinoza is a 65 y.o. male seen for follow up of Acute on Chronic Respiratory Failure.  Patient is resting comfortably right now without distress has been afebrile  Medications: Reviewed on Rounds  Physical Exam:  Vitals: Temperature is 98.6 pulse 95 respiratory rate is 30 blood pressure 105/59 saturations 100%  Ventilator Settings on assist control FiO2 45% tidal volume 400 PEEP 5  . General: Comfortable at this time . Eyes: Grossly normal lids, irises & conjunctiva . ENT: grossly tongue is normal . Neck: no obvious mass . Cardiovascular: S1 S2 normal no gallop . Respiratory: Scattered rhonchi noted bilaterally . Abdomen: soft . Skin: no rash seen on limited exam . Musculoskeletal: not rigid . Psychiatric:unable to assess . Neurologic: no seizure no involuntary movements         Lab Data:   Basic Metabolic Panel: Recent Labs  Lab 08/19/20 0239 08/24/20 0449  NA 143 140  K 3.7 3.4*  CL 103 100  CO2 32 35*  GLUCOSE 143* 135*  BUN 25* 13  CREATININE <0.30* <0.30*  CALCIUM 9.2 9.0  MG 2.0 2.1    ABG: No results for input(s): PHART, PCO2ART, PO2ART, HCO3, O2SAT in the last 168 hours.  Liver Function Tests: No results for input(s): AST, ALT, ALKPHOS, BILITOT, PROT, ALBUMIN in the last 168 hours. No results for input(s): LIPASE, AMYLASE in the last 168 hours. No results for input(s): AMMONIA in the last 168 hours.  CBC: Recent Labs  Lab 08/19/20 0239 08/24/20 0449  WBC 7.2 8.9  HGB 9.4* 10.1*  HCT 31.1* 32.6*  MCV 94.8 93.9  PLT 166 152    Cardiac Enzymes: No results for input(s): CKTOTAL, CKMB, CKMBINDEX, TROPONINI in the last 168 hours.  BNP (last 3 results) No results for input(s): BNP in the last 8760 hours.  ProBNP  (last 3 results) No results for input(s): PROBNP in the last 8760 hours.  Radiological Exams: No results found.  Assessment/Plan Active Problems:   Acute on chronic respiratory failure with hypoxia (HCC)   Acute inflammatory demyelinating polyneuropathy (HCC)   Dysautonomia (HCC)   Atrial fibrillation with RVR (HCC)   Healthcare associated bacterial pneumonia   1. Acute on chronic respiratory failure with hypoxia we will continue with attempt at weaning right now is resting on assist control on 45% FiO2. 2. Acute inflammatory demyelinating polyneuropathy has had no improvement 3. Dysautonomia continues to have a regular heart rhythm 4. Atrial fibrillation rate now rate controlled 5. Healthcare associated pneumonia has been treated   I have personally seen and evaluated the patient, evaluated laboratory and imaging results, formulated the assessment and plan and placed orders. The Patient requires high complexity decision making with multiple systems involvement.  Rounds were done with the Respiratory Therapy Director and Staff therapists and discussed with nursing staff also.  Brian Pax, MD [facility] Pulmonary Critical Care Medicine Sleep Medicine

## 2020-08-25 LAB — POTASSIUM: Potassium: 3.7 mmol/L (ref 3.5–5.1)

## 2020-08-25 NOTE — Consult Note (Signed)
Ref: Elana Alm, MD   Subjective:  Awake. Restless. Back in atrial fibrillation.  Objective:  Vital Signs in the last 24 hours: P: 80-130, R: 28, BP: 100/60, O2 sat 100 % on 35 % FiO2.  Physical Exam: BP Readings from Last 1 Encounters:  05/06/19 124/79     Wt Readings from Last 1 Encounters:  04/04/14 101.2 kg    Weight change:  There is no height or weight on file to calculate BMI. HEENT: [place]/AT, Eyes-Blue, Conjunctiva-Pink, Sclera-Non-icteric Neck: No JVD, No bruit, Tracheostomy present. Lungs:  Clearing, Bilateral. Cardiac:  Regular rhythm, normal S1 and S2, no S3. II/VI systolic murmur. Abdomen:  Soft, non-tender. BS present. Extremities:  No edema present. No cyanosis. No clubbing. CNS: AxOx1.  Skin: Warm and dry.   Intake/Output from previous day: No intake/output data recorded.    Lab Results: BMET    Component Value Date/Time   NA 140 08/24/2020 0449   NA 143 08/19/2020 0239   NA 138 08/14/2020 0704   K 3.7 08/25/2020 1033   K 3.4 (L) 08/24/2020 0449   K 3.7 08/19/2020 0239   CL 100 08/24/2020 0449   CL 103 08/19/2020 0239   CL 97 (L) 08/14/2020 0704   CO2 35 (H) 08/24/2020 0449   CO2 32 08/19/2020 0239   CO2 36 (H) 08/14/2020 0704   GLUCOSE 135 (H) 08/24/2020 0449   GLUCOSE 143 (H) 08/19/2020 0239   GLUCOSE 156 (H) 08/14/2020 0704   BUN 13 08/24/2020 0449   BUN 25 (H) 08/19/2020 0239   BUN 24 (H) 08/14/2020 0704   CREATININE <0.30 (L) 08/24/2020 0449   CREATININE <0.30 (L) 08/19/2020 0239   CREATININE <0.30 (L) 08/14/2020 0704   CALCIUM 9.0 08/24/2020 0449   CALCIUM 9.2 08/19/2020 0239   CALCIUM 9.3 08/14/2020 0704   GFRNONAA NOT CALCULATED 08/24/2020 0449   GFRNONAA NOT CALCULATED 08/19/2020 0239   GFRNONAA NOT CALCULATED 08/14/2020 0704   CBC    Component Value Date/Time   WBC 8.9 08/24/2020 0449   RBC 3.47 (L) 08/24/2020 0449   HGB 10.1 (L) 08/24/2020 0449   HCT 32.6 (L) 08/24/2020 0449   PLT 152 08/24/2020 0449   MCV 93.9  08/24/2020 0449   MCH 29.1 08/24/2020 0449   MCHC 31.0 08/24/2020 0449   RDW 17.9 (H) 08/24/2020 0449   LYMPHSABS 2.3 06/06/2020 0418   MONOABS 0.9 06/06/2020 0418   EOSABS 0.0 06/06/2020 0418   BASOSABS 0.1 06/06/2020 0418   HEPATIC Function Panel Recent Labs    06/06/20 0418 07/19/20 0511 07/27/20 0433  PROT 6.8 4.8* 4.9*   HEMOGLOBIN A1C No components found for: HGA1C,  MPG CARDIAC ENZYMES Lab Results  Component Value Date   CKTOTAL 44 (L) 07/02/2020   BNP No results for input(s): PROBNP in the last 8760 hours. TSH Recent Labs    06/06/20 0418  TSH 3.257   CHOLESTEROL No results for input(s): CHOL in the last 8760 hours.  Scheduled Meds: Continuous Infusions: PRN Meds:.  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   LOS: 0 days   Time spent including chart review, lab review, examination, discussion with patient :  min   Orpah Cobb  MD  08/25/2020, 11:24 PM

## 2020-08-26 NOTE — Consult Note (Signed)
Ref: Elana Alm, MD   Subjective:  Awake. Decreased restlessness. Monitor: Sinus rhythm  Objective:  Vital Signs in the last 24 hours: P: 74, R: 26, BP: 108/60, O2 sat  95 % on 35 % FiO2.   Physical Exam: BP Readings from Last 1 Encounters:  05/06/19 124/79     Wt Readings from Last 1 Encounters:  04/04/14 101.2 kg    Weight change:  There is no height or weight on file to calculate BMI. HEENT: [place]/AT, Eyes-Blue, Conjunctiva-Pink, Sclera-Non-icteric Neck: No JVD, No bruit, Trachea midline. Lungs:  Clearing, Bilateral. Cardiac:  Regular rhythm, normal S1 and S2, no S3. II/VI systolic murmur. Abdomen:  Soft, non-tender. BS present. Extremities:  No edema present. No cyanosis. No clubbing. CNS: AxOx1, Cranial nerves grossly intact, moves all 4 extremities.  Skin: Warm and dry.   Intake/Output from previous day: No intake/output data recorded.    Lab Results: BMET    Component Value Date/Time   NA 140 08/24/2020 0449   NA 143 08/19/2020 0239   NA 138 08/14/2020 0704   K 3.7 08/25/2020 1033   K 3.4 (L) 08/24/2020 0449   K 3.7 08/19/2020 0239   CL 100 08/24/2020 0449   CL 103 08/19/2020 0239   CL 97 (L) 08/14/2020 0704   CO2 35 (H) 08/24/2020 0449   CO2 32 08/19/2020 0239   CO2 36 (H) 08/14/2020 0704   GLUCOSE 135 (H) 08/24/2020 0449   GLUCOSE 143 (H) 08/19/2020 0239   GLUCOSE 156 (H) 08/14/2020 0704   BUN 13 08/24/2020 0449   BUN 25 (H) 08/19/2020 0239   BUN 24 (H) 08/14/2020 0704   CREATININE <0.30 (L) 08/24/2020 0449   CREATININE <0.30 (L) 08/19/2020 0239   CREATININE <0.30 (L) 08/14/2020 0704   CALCIUM 9.0 08/24/2020 0449   CALCIUM 9.2 08/19/2020 0239   CALCIUM 9.3 08/14/2020 0704   GFRNONAA NOT CALCULATED 08/24/2020 0449   GFRNONAA NOT CALCULATED 08/19/2020 0239   GFRNONAA NOT CALCULATED 08/14/2020 0704   CBC    Component Value Date/Time   WBC 8.9 08/24/2020 0449   RBC 3.47 (L) 08/24/2020 0449   HGB 10.1 (L) 08/24/2020 0449   HCT 32.6 (L)  08/24/2020 0449   PLT 152 08/24/2020 0449   MCV 93.9 08/24/2020 0449   MCH 29.1 08/24/2020 0449   MCHC 31.0 08/24/2020 0449   RDW 17.9 (H) 08/24/2020 0449   LYMPHSABS 2.3 06/06/2020 0418   MONOABS 0.9 06/06/2020 0418   EOSABS 0.0 06/06/2020 0418   BASOSABS 0.1 06/06/2020 0418   HEPATIC Function Panel Recent Labs    06/06/20 0418 07/19/20 0511 07/27/20 0433  PROT 6.8 4.8* 4.9*   HEMOGLOBIN A1C No components found for: HGA1C,  MPG CARDIAC ENZYMES Lab Results  Component Value Date   CKTOTAL 44 (L) 07/02/2020   BNP No results for input(s): PROBNP in the last 8760 hours. TSH Recent Labs    06/06/20 0418  TSH 3.257   CHOLESTEROL No results for input(s): CHOL in the last 8760 hours.  Scheduled Meds: Continuous Infusions: PRN Meds:.  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   LOS: 0 days   Time spent including chart review, lab review, examination, discussion with patient/Wife/Nurse : 30 min   Orpah Cobb  MD  08/26/2020, 11:26 AM

## 2020-08-30 ENCOUNTER — Other Ambulatory Visit (HOSPITAL_COMMUNITY): Payer: Medicare Other

## 2020-08-30 LAB — CBC
HCT: 33.9 % — ABNORMAL LOW (ref 39.0–52.0)
Hemoglobin: 10.3 g/dL — ABNORMAL LOW (ref 13.0–17.0)
MCH: 28.2 pg (ref 26.0–34.0)
MCHC: 30.4 g/dL (ref 30.0–36.0)
MCV: 92.9 fL (ref 80.0–100.0)
Platelets: 126 10*3/uL — ABNORMAL LOW (ref 150–400)
RBC: 3.65 MIL/uL — ABNORMAL LOW (ref 4.22–5.81)
RDW: 16.9 % — ABNORMAL HIGH (ref 11.5–15.5)
WBC: 7.2 10*3/uL (ref 4.0–10.5)
nRBC: 0 % (ref 0.0–0.2)

## 2020-08-30 LAB — BASIC METABOLIC PANEL
Anion gap: 6 (ref 5–15)
BUN: 15 mg/dL (ref 8–23)
CO2: 33 mmol/L — ABNORMAL HIGH (ref 22–32)
Calcium: 9 mg/dL (ref 8.9–10.3)
Chloride: 104 mmol/L (ref 98–111)
Creatinine, Ser: <0.3 mg/dL — ABNORMAL LOW (ref 0.61–1.24)
Glucose, Bld: 116 mg/dL — ABNORMAL HIGH (ref 70–99)
Potassium: 3.4 mmol/L — ABNORMAL LOW (ref 3.5–5.1)
Sodium: 143 mmol/L (ref 135–145)

## 2020-08-30 LAB — MAGNESIUM: Magnesium: 2.1 mg/dL (ref 1.7–2.4)

## 2020-08-31 LAB — POTASSIUM: Potassium: 3.5 mmol/L (ref 3.5–5.1)

## 2021-01-15 ENCOUNTER — Other Ambulatory Visit: Payer: Self-pay

## 2021-01-15 ENCOUNTER — Emergency Department (HOSPITAL_COMMUNITY)
Admission: EM | Admit: 2021-01-15 | Discharge: 2021-01-16 | Disposition: A | Discharge status: Home / Self Care | Payer: Medicare Other | Attending: Emergency Medicine | Admitting: Emergency Medicine

## 2021-01-15 DIAGNOSIS — N3091 Cystitis, unspecified with hematuria: Secondary | ICD-10-CM | POA: Insufficient documentation

## 2021-01-15 DIAGNOSIS — Z7901 Long term (current) use of anticoagulants: Secondary | ICD-10-CM | POA: Diagnosis not present

## 2021-01-15 DIAGNOSIS — R319 Hematuria, unspecified: Secondary | ICD-10-CM | POA: Diagnosis present

## 2021-01-15 DIAGNOSIS — I4891 Unspecified atrial fibrillation: Secondary | ICD-10-CM | POA: Diagnosis not present

## 2021-01-15 LAB — COMPREHENSIVE METABOLIC PANEL
ALT: 8 U/L (ref 0–44)
AST: 14 U/L — ABNORMAL LOW (ref 15–41)
Albumin: 3 g/dL — ABNORMAL LOW (ref 3.5–5.0)
Alkaline Phosphatase: 61 U/L (ref 38–126)
Anion gap: 5 (ref 5–15)
BUN: 9 mg/dL (ref 8–23)
CO2: 38 mmol/L — ABNORMAL HIGH (ref 22–32)
Calcium: 9.1 mg/dL (ref 8.9–10.3)
Chloride: 97 mmol/L — ABNORMAL LOW (ref 98–111)
Creatinine, Ser: 0.42 mg/dL — ABNORMAL LOW (ref 0.61–1.24)
GFR, Estimated: >60 mL/min (ref >60)
Glucose, Bld: 95 mg/dL (ref 70–99)
Potassium: 3.9 mmol/L (ref 3.5–5.1)
Sodium: 140 mmol/L (ref 135–145)
Total Bilirubin: 0.5 mg/dL (ref 0.3–1.2)
Total Protein: 6 g/dL — ABNORMAL LOW (ref 6.5–8.1)

## 2021-01-15 LAB — URINALYSIS, MICROSCOPIC (REFLEX): RBC / HPF: >50 RBC/hpf (ref 0–5)

## 2021-01-15 LAB — CBC WITH DIFFERENTIAL/PLATELET
Abs Immature Granulocytes: 0.04 10*3/uL (ref 0.00–0.07)
Basophils Absolute: 0.1 10*3/uL (ref 0.0–0.1)
Basophils Relative: 1 %
Eosinophils Absolute: 0.3 10*3/uL (ref 0.0–0.5)
Eosinophils Relative: 6 %
HCT: 41.8 % (ref 39.0–52.0)
Hemoglobin: 12.2 g/dL — ABNORMAL LOW (ref 13.0–17.0)
Immature Granulocytes: 1 %
Lymphocytes Relative: 20 %
Lymphs Abs: 1.2 10*3/uL (ref 0.7–4.0)
MCH: 25.2 pg — ABNORMAL LOW (ref 26.0–34.0)
MCHC: 29.2 g/dL — ABNORMAL LOW (ref 30.0–36.0)
MCV: 86.4 fL (ref 80.0–100.0)
Monocytes Absolute: 0.5 10*3/uL (ref 0.1–1.0)
Monocytes Relative: 8 %
Neutro Abs: 3.9 10*3/uL (ref 1.7–7.7)
Neutrophils Relative %: 64 %
Platelets: 209 10*3/uL (ref 150–400)
RBC: 4.84 MIL/uL (ref 4.22–5.81)
RDW: 15.9 % — ABNORMAL HIGH (ref 11.5–15.5)
WBC: 6 10*3/uL (ref 4.0–10.5)
nRBC: 0 % (ref 0.0–0.2)

## 2021-01-15 LAB — URINALYSIS, ROUTINE W REFLEX MICROSCOPIC
Bacteria, UA: NONE SEEN
Bilirubin Urine: NEGATIVE
Glucose, UA: NEGATIVE mg/dL
Ketones, ur: NEGATIVE mg/dL
Nitrite: NEGATIVE
Protein, ur: 100 mg/dL — AB
RBC / HPF: >50 RBC/hpf — ABNORMAL HIGH (ref 0–5)
Specific Gravity, Urine: 1.016 (ref 1.005–1.030)
pH: 6 (ref 5.0–8.0)

## 2021-01-15 MED ORDER — SODIUM CHLORIDE 0.9 % IV SOLN
2.0000 g | Freq: Two times a day (BID) | INTRAVENOUS | Status: DC
  Administered 2021-01-15: 2 g via INTRAVENOUS
  Filled 2021-01-15: qty 2

## 2021-01-15 NOTE — ED Notes (Signed)
Attempted to call report to [facility], no answer-rang until busy

## 2021-01-15 NOTE — Discharge Instructions (Addendum)
Mr. Brian Espinoza was sent to the emergency room for bloody urine.  We have discontinued his Foley catheter.  We suspect that the source of his infection is the Foley catheter itself.  Clean-catch urine after that shows gross hematuria with bacteriuria and pyuria -in with him having symptoms of burning with urination and urgency, we suspect he has hemorrhagic cystitis.  The patient and family also wanted to try condom catheter, therefore new Foley catheter was not introduced here.  They were made aware that he might need reintroduction of Foley catheter if he is unable to void.  After the Foley catheter was removed, Mr. Brian Espinoza was able to urinate and empty his bladder on ultrasound.  At this time, we recommend that he gets cefepime for likely pseudomonal UTI.  We recommend that he gets a condom catheter given his disability. We also recommend that be discontinue Xarelto for 3 to 5 days.

## 2021-01-15 NOTE — ED Notes (Signed)
[facility]

## 2021-01-15 NOTE — ED Notes (Signed)
[facility] called patient is number 10 on the list  

## 2021-01-15 NOTE — ED Notes (Addendum)
Clamped pt's foley and pulled sample from port at top of tubing

## 2021-01-15 NOTE — ED Notes (Signed)
Condom catheter placed on pt.

## 2021-01-15 NOTE — ED Provider Notes (Signed)
[facility]MENT Provider Note   CSN: [id number] Arrival date & time: 01/15/21  1400     History Chief Complaint  Patient presents with   Hematuria    Brian Espinoza is a 65 y.o. male.  HPI     64 year old comes in with chief complaint of bloody urine.  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  Patient has a chronic indwelling Foley catheter.  He also has history of A. Fib on Xarelto.  Patient states that he has gone through 2 rounds of antibiotics -cefepime and vancomycin for suspected UTI.  Antibiotics were discontinued just for 5 days ago.  Subsequently, he started having some burning with urination and also urinary urgency.  It is unclear why he was sent to the emergency room from [facility].  Patient denies any new back pain, fevers, chills, nausea, vomiting.  He denies any history of kidney stone.  At the moment he has no pain, unless he is urinating.  Family has not seen large clots in the urine.  Past Medical History:  Diagnosis Date   Acute inflammatory demyelinating polyneuropathy (HCC)    Acute on chronic respiratory failure with hypoxia (HCC)    Atrial fibrillation with RVR (HCC)    Dysautonomia (HCC)    Healthcare associated bacterial pneumonia     Patient Active Problem List   Diagnosis Date Noted   Acute on chronic respiratory failure with hypoxia (HCC)    Acute inflammatory demyelinating polyneuropathy (HCC)    Dysautonomia (HCC)    Atrial fibrillation with RVR (HCC)    Healthcare associated bacterial pneumonia       No family history on file.     Home Medications Prior to Admission medications   Medication Sig Start Date End Date Taking? Authorizing Provider  acetaminophen (TYLENOL) 500 MG tablet Place 1,000 mg into feeding tube every 8 (eight) hours as needed (pain).   Yes [provider]  albuterol (PROVENTIL) (2.5 MG/3ML) 0.083% nebulizer solution Take 2.5 mg by nebulization every 4  (four) hours as needed for wheezing or shortness of breath.   Yes [provider]  ALPRAZolam Prudy Feeler) 0.5 MG tablet Place 0.5 mg into feeding tube See admin instructions. Take one tablet (0.5 mg) per tube daily at bedtime, may also take one tablet (0.5 mg) every 6 hours prn anxiety   Yes [provider]  Amino Acids-Protein Hydrolys (FEEDING SUPPLEMENT, PRO-STAT 64,) LIQD Place 30 mLs into feeding tube every morning.   Yes [provider]  amiodarone (PACERONE) 200 MG tablet Place 200 mg into feeding tube every morning.   Yes [provider]  chlorhexidine (PERIDEX) 0.12 % solution Use as directed 15 mLs in the mouth or throat 2 (two) times daily.   Yes [provider]  cyclobenzaprine (FLEXERIL) 10 MG tablet Place 10 mg into feeding tube 3 (three) times daily as needed for muscle spasms.   Yes [provider]  gabapentin (NEURONTIN) 100 MG capsule 200 mg 3 (three) times daily. Per tube   Yes [provider]  lamoTRIgine (LAMICTAL) 25 MG tablet Place 25 mg into feeding tube 2 (two) times daily.   Yes [provider]  lansoprazole (PREVACID SOLUTAB) 30 MG disintegrating tablet Place 30 mg into feeding tube every morning.   Yes [provider]  latanoprost (XALATAN) 0.005 % ophthalmic solution Place 1 drop into both eyes at bedtime.   Yes [provider]  mirtazapine (REMERON) 7.5 MG tablet Place  7.5 mg into feeding tube at bedtime.   Yes [provider]  nutrition supplement, JUVEN, (JUVEN) PACK Place 1 packet into feeding tube 2 (two) times daily between meals.   Yes [provider]  OLANZapine (ZYPREXA) 2.5 MG tablet Place 2.5 mg into feeding tube every 6 (six) hours as needed (restlessness and agitation).   Yes [provider]  OLANZapine zydis (ZYPREXA) 5 MG disintegrating tablet 5 mg every morning. Per tube   Yes [provider]  polyethylene glycol (MIRALAX / GLYCOLAX) 17 g  packet Place 17 g into feeding tube every 12 (twelve) hours as needed (constipation).   Yes [provider]  rivaroxaban (XARELTO) 20 MG TABS tablet 20 mg every morning. Per tube   Yes [provider]  sertraline (ZOLOFT) 50 MG tablet Place 50 mg into feeding tube at bedtime.   Yes [provider]  Sodium Chloride Flush (NORMAL SALINE FLUSH) 0.9 % SOLN Inject 10 mLs into the vein 2 (two) times daily.   Yes [provider]  traZODone (DESYREL) 50 MG tablet Place 50 mg into feeding tube at bedtime.   Yes [provider]  ceFEPIme 1 g in sodium chloride 0.9 % 100 mL Inject 1 g into the vein every 8 (eight) hours.    [provider]  Vancomycin 1,500 mg in sodium chloride 0.9 % 500 mL Inject 1,500 mg into the vein every 12 (twelve) hours.    [provider]    Allergies    Patient has no known allergies.  Review of Systems   Review of Systems  Constitutional:  Negative for fever.  Gastrointestinal:  Negative for nausea and vomiting.  Genitourinary:  Positive for dysuria.  Hematological:  Bruises/bleeds easily.  All other systems reviewed and are negative.  Physical Exam Updated Vital Signs BP 118/73   Pulse 67   Temp 98.8 F (37.1 C) (Oral)   Resp 20   SpO2 97%   Physical Exam Vitals and nursing note reviewed.  Constitutional:      Appearance: He is well-developed.  HENT:     Head: Atraumatic.  Cardiovascular:     Rate and Rhythm: Normal rate.  Pulmonary:     Effort: Pulmonary effort is normal.  Genitourinary:    Comments: Foley catheter with gross hematuria. Musculoskeletal:     Cervical back: Neck supple.  Skin:    General: Skin is warm.  Neurological:     Mental Status: He is alert and oriented to person, place, and time.    ED Results / Procedures / Treatments   Labs (all labs ordered are listed, but only abnormal results are displayed) Labs Reviewed  CBC WITH DIFFERENTIAL/PLATELET - Abnormal; Notable  for the following components:      Result Value   Hemoglobin 12.2 (*)    MCH 25.2 (*)    MCHC 29.2 (*)    RDW 15.9 (*)    All other components within normal limits  COMPREHENSIVE METABOLIC PANEL - Abnormal; Notable for the following components:   Chloride 97 (*)    CO2 38 (*)    Creatinine, Ser 0.42 (*)    Total Protein 6.0 (*)    Albumin 3.0 (*)    AST 14 (*)    All other components within normal limits  URINALYSIS, ROUTINE W REFLEX MICROSCOPIC - Abnormal; Notable for the following components:   APPearance CLOUDY (*)    Hgb urine dipstick LARGE (*)    Protein, ur 100 (*)  Leukocytes,Ua SMALL (*)    RBC / HPF >50 (*)    All other components within normal limits  URINALYSIS, ROUTINE W REFLEX MICROSCOPIC - Abnormal; Notable for the following components:   Color, Urine RED (*)    APPearance TURBID (*)    Glucose, UA   (*)    Value: TEST NOT REPORTED DUE TO COLOR INTERFERENCE OF URINE PIGMENT   Hgb urine dipstick   (*)    Value: TEST NOT REPORTED DUE TO COLOR INTERFERENCE OF URINE PIGMENT   Bilirubin Urine   (*)    Value: TEST NOT REPORTED DUE TO COLOR INTERFERENCE OF URINE PIGMENT   Ketones, ur   (*)    Value: TEST NOT REPORTED DUE TO COLOR INTERFERENCE OF URINE PIGMENT   Protein, ur   (*)    Value: TEST NOT REPORTED DUE TO COLOR INTERFERENCE OF URINE PIGMENT   Nitrite   (*)    Value: TEST NOT REPORTED DUE TO COLOR INTERFERENCE OF URINE PIGMENT   Leukocytes,Ua   (*)    Value: TEST NOT REPORTED DUE TO COLOR INTERFERENCE OF URINE PIGMENT   All other components within normal limits  URINALYSIS, MICROSCOPIC (REFLEX) - Abnormal; Notable for the following components:   Bacteria, UA MANY (*)    All other components within normal limits  URINE CULTURE    EKG None  Radiology No results found.  Procedures Procedures   Medications Ordered in ED Medications  ceFEPIme (MAXIPIME) 2 g in sodium chloride 0.9 % 100 mL IVPB (0 g Intravenous Stopped 01/15/21 2053)    ED Course   I have reviewed the triage vital signs and the nursing notes.  Pertinent labs & imaging results that were available during my care of the patient were reviewed by me and considered in my medical decision making (see chart for details).    MDM Rules/Calculators/A&P                           65 year old male comes in with chief complaint of bloody urine.  He is on Xarelto for A. fib.  Patient has history of neurogenic bladder and chronic indwelling Foley catheter.  He has suffered 3 UTI 3 or 4 times since the diagnosis of GBS per patient's wife.  More recently he was on vancomycin and cefepime, that was completed just a week ago.  Since then he started having some burning with urination and now gross hematuria.  Systemic symptoms are overall reassuring.  No nausea, vomiting, fevers, chills, flank pain.  No history of kidney stones.  Wife reports that she does not think the Foley catheter was ever changed in the last 2 months that he has had Foley catheter at the [facility].  My suspicion is that the nidus for infection is the Foley catheter itself.  We will replace the Foley catheter here.   Reassessment: Family is wondering if we can remove the Foley catheter and allow patient to void by himself.  They have requested condom catheter many times in the past, but have been instructed that patient likely needs Foley catheter permanently.  Nursing staff did allow for the patient to void by himself.  He successfully voided, and the bladder scan showed empty bladder at that time.  Subsequently patient has urinated little bit more since then.  The family wants to try and see if patient succeeds with condom catheter.    Final Clinical Impression(s) / ED Diagnoses Final diagnoses:  Hemorrhagic cystitis    Rx / DC Orders ED Discharge Orders     None        Derwood KaplanNanavati, Afshin Chrystal, MD 01/15/21 2131

## 2021-01-15 NOTE — ED Triage Notes (Signed)
Pt BIB EMS from [facility] to evaluate for hematuria. 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 Pt has chronic indwelling foley that was noted to have red bloody urine in tubing. No recent trauma or injury per pt.

## 2021-01-15 NOTE — ED Notes (Signed)
Order to d/c pt's old foley catheter and insert new. Extensive discussion with pt and wife indicated that they did NOT want a foley re-inserted if possible, and they had been trying to get foley d/c'd at [facility] but MD not agreeable at that facility. RN did d/c foley that was present on admission, purulent mucus was noted on the tubing on discontinuation. Due to this blatant infection risk and the pt and family's desires, this RN did not replace foley.   Pt was given several cups of water per his request, and subsequently called RN with urge to void. Pt was able to void approx into urinal, very bloody urine output. Pt voided an additional approx 15 minutes later with similar bloody appearance but color was lighter pink than bright red. Post residual bladder scan was performed and pt was noted to have 65mL in bladder. This was reported to ED MD who stated to forgo replacing indwelling catheter at this time.  This RN called [facility] at request of pt's wife and confirmed they do have supplies for condom catheters, as this was a more agreeable approach for the pt and his wife in the future.

## 2021-01-16 NOTE — ED Notes (Signed)
Pt was alert and oriented at discharge x 4. Was in no distress

## 2021-01-17 LAB — URINE CULTURE

## 2021-03-19 ENCOUNTER — Other Ambulatory Visit: Payer: Self-pay

## 2021-03-19 ENCOUNTER — Ambulatory Visit: Payer: Medicare Other | Admitting: Speech Pathology

## 2021-03-19 ENCOUNTER — Ambulatory Visit: Payer: Medicare Other | Attending: Cardiovascular Disease | Admitting: Physical Therapy

## 2021-03-19 DIAGNOSIS — R2681 Unsteadiness on feet: Secondary | ICD-10-CM | POA: Insufficient documentation

## 2021-03-19 DIAGNOSIS — R278 Other lack of coordination: Secondary | ICD-10-CM | POA: Insufficient documentation

## 2021-03-19 DIAGNOSIS — M25642 Stiffness of left hand, not elsewhere classified: Secondary | ICD-10-CM | POA: Insufficient documentation

## 2021-03-19 DIAGNOSIS — R293 Abnormal posture: Secondary | ICD-10-CM | POA: Diagnosis present

## 2021-03-19 DIAGNOSIS — R2689 Other abnormalities of gait and mobility: Secondary | ICD-10-CM | POA: Insufficient documentation

## 2021-03-19 DIAGNOSIS — R471 Dysarthria and anarthria: Secondary | ICD-10-CM | POA: Diagnosis present

## 2021-03-19 DIAGNOSIS — M25641 Stiffness of right hand, not elsewhere classified: Secondary | ICD-10-CM | POA: Diagnosis present

## 2021-03-19 DIAGNOSIS — R208 Other disturbances of skin sensation: Secondary | ICD-10-CM | POA: Diagnosis present

## 2021-03-19 DIAGNOSIS — R295 Transient paralysis: Secondary | ICD-10-CM | POA: Insufficient documentation

## 2021-03-19 DIAGNOSIS — M6281 Muscle weakness (generalized): Secondary | ICD-10-CM | POA: Insufficient documentation

## 2021-03-19 DIAGNOSIS — R1312 Dysphagia, oropharyngeal phase: Secondary | ICD-10-CM | POA: Insufficient documentation

## 2021-03-19 NOTE — Therapy (Signed)
[facility] [address] [place], [place], [address] Phone: [phone]   Fax:  [phone]  Physical Therapy Evaluation  Patient Details  Name: Brian Espinoza MRN: [id number] Date of Birth: 01-22-1956 Referring Provider (PT): Alison Murray, MD   Encounter Date: 03/19/2021   PT End of Session - 03/19/21 2143     Visit Number 1    Number of Visits 17    Date for PT Re-Evaluation 05/18/21    Authorization Type Medicare A&B; BCBS - 10th visit PN    Progress Note Due on Visit 10    PT Start Time 1230    PT Stop Time 1320    PT Time Calculation (min) 50 min    Equipment Utilized During Treatment Other (comment)   hoyer lift   Activity Tolerance Patient limited by pain    Behavior During Therapy WFL for tasks assessed/performed             Past Medical History:  Diagnosis Date   Acute inflammatory demyelinating polyneuropathy (HCC)    Acute on chronic respiratory failure with hypoxia (HCC)    Atrial fibrillation with RVR (HCC)    Dysautonomia (HCC)    Healthcare associated bacterial pneumonia     There were no vitals filed for this visit.    Subjective Assessment - 03/19/21 1241     Subjective One month after flu and COVID vaccines: Pt admitted to hospital in 04/2020 for GBS - spent months in ICU then transferred to [facility] and did worse so transitioned back to acute and then to SNF to gain strength until he could tolerate 3 hours of therapy.  Then spent 6 weeks at [facility] where the trach was D/C and feeding tube was D/C.  Has been home for 12 days with hospital bed, hoyer lift, tilt in space wheelchair; also sent home with [facility] and shower bench but not able to use currently.  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  Has a significant wound on sacrum - did have wound vac but is healing.  Also has aortic aneurysm they are monitoring: systolic BP must remain  between 120-130.    Patient is accompained by: Family member    Pertinent History Chest pain, dissection of descending thoracic aorta, aneurysm of artery, a-fib, HTN, systolic CHF, oral phase dysphagia, sacral wound, PNA    Patient Stated Goals Improve hand function and walk to be more independent with daily activities.  Gain strength in legs to be able to lift hips and reduce shear on sacrum.    Currently in Pain? Yes    Pain Score 10-Worst pain ever    Pain Location Buttocks    Pain Descriptors / Indicators Discomfort                [facility] PT Assessment - 03/19/21 1253       Assessment   Medical Diagnosis GBS    Referring Provider (PT) Alison Murray, MD    Onset Date/Surgical Date 03/14/21    Prior Therapy Acute care, [facility], [facility], SNF      Precautions   Precautions Other (comment)    Precaution Comments Monitor systolic BP: keep between 120-130; Chest pain, dissection of descending thoracic aorta, aneurysm of artery, a-fib, HTN, systolic CHF, oral phase dysphagia, sacral wound, PNA    Required Braces or Orthoses Other Brace/Splint    Other Brace/Splint has multiple resting hand splints  Restrictions   Weight Bearing Restrictions No      Balance Screen   Has the patient fallen in the past 6 months Yes    How many times? 1   rolled out of the bed     Home Environment   Living Environment Private residence    Living Arrangements Spouse/significant other    Type of Home House    Home Access Ramped entrance    Home Layout One level    Home Equipment Bedside commode;Tub bench;Wheelchair - manual;Hospital bed;Other (comment)   hoyer lift   Additional Comments in loaner wheelchair, working with [employer]      Prior Function   Level of Independence Independent    Vocation Retired      Animator Impaired Details Impaired RLE;Impaired LLE    Additional Comments Reports tingling      Coordination   Gross Motor  Movements are Fluid and Coordinated No      Posture/Postural Control   Posture/Postural Control Postural limitations    Posture Comments Stays tilted back in wheelchair to remove pressure off sacrum      Tone   Assessment Location Right Lower Extremity;Left Lower Extremity      ROM / Strength   AROM / PROM / Strength Strength      Strength   Overall Strength Deficits    Overall Strength Comments R: hip flexion 3+/5; knee extension 4-/5; knee flexion 3+/5, ankle PF/inversion/eversion 3/5; 0/5 ankle DF.  L: hip flexion 3+/5, knee extension 4/5, knee flexion 3+/5, ankle PF 2/5, ankle inversion/eversion/DF 0/5      Transfers   Transfer via Biochemist, clinical from w/c <> mat; when transitioning to sitting on mat pt unable to keep trunk forward due to pain in sacrum and experienced sudden trunk and hip extension causing pt to fall back on mat; required total A to return to sitting edge of mat      Ambulation/Gait   Ambulation/Gait No      Balance   Balance Assessed Yes      Static Sitting Balance   Static Sitting - Balance Support No upper extremity supported;Feet supported    Static Sitting - Level of Assistance 4: Min assist;5: Stand by assistance    Static Sitting - Comment/# of Minutes Sitting edge of mat with very light back support.  Pt able to perform head turns in different directions, lateral leans and weight shifting within limits of stability; PT able to release support around patient's back intermittently.  Also able to perform anterior leans to WB through bilat feet and begin to activate LE extensor muscles.      RLE Tone   RLE Tone Moderate;Other (comment)   hyporeflexic but experiences spasms     LLE Tone   LLE Tone Moderate;Other (comment)   hyporeflexic but experiences spasms                       Objective measurements completed on examination: See above findings.                PT Education - 03/19/21 2142      Education Details Clinical findings, PT POC and goals    Person(s) Educated Patient;Spouse    Methods Explanation    Comprehension Verbalized understanding              PT Short Term Goals - 03/19/21  2153       PT SHORT TERM GOAL #1   Title Pt and wife will demonstrate ability to perform initial supine and seated HEP    Time 4    Period Weeks    Status New    Target Date 04/18/21      PT SHORT TERM GOAL #2   Title Pt will demonstrate ability to perform rolling and supine <> sit with moderate assistance    Time 4    Period Weeks    Status New    Target Date 04/18/21      PT SHORT TERM GOAL #3   Title Pt will demonstrate ability to perform slideboard on level surfaces w/c <> mat with 50% assistance from PT    Baseline total A in hoyer    Time 4    Period Weeks    Status New    Target Date 04/18/21      PT SHORT TERM GOAL #4   Title Pt will tolerate 5 minutes sustained, supported standing (standing frame or SARA) while performing weight shifting, LE activation training, etc    Time 4    Period Weeks    Status New    Target Date 04/18/21               PT Long Term Goals - 03/19/21 2200       PT LONG TERM GOAL #1   Title Pt and wife will demonstrate independence with progressed HEP    Time 8    Period Weeks    Status New    Target Date 05/18/21      PT LONG TERM GOAL #2   Title Pt will demonstrate ability to perform rolling and supine <> sit with minimal assistance    Time 8    Period Weeks    Status New    Target Date 05/18/21      PT LONG TERM GOAL #3   Title Pt will perform slideboard transfers w/c <> mat on level surface Min A and will begin to demonstrate ability to perform squat-scooting across board    Time 8    Period Weeks    Status New    Target Date 05/18/21      PT LONG TERM GOAL #4   Title Pt will demonstrate ability to perform sit > stand from elevated surface with +2 assistance and UE support    Time 8    Period Weeks    Status  New    Target Date 05/18/21                    Plan - 03/19/21 2144     Clinical Impression Statement Pt is a 65 year old male referred to Neuro OPPT for evaluation of GBS.  Pt's PMH is significant for the following: Chest pain, dissection of descending thoracic aorta, aneurysm of artery, a-fib, HTN, systolic CHF, oral phase dysphagia, sacral wound, PNA.  The following deficits were noted during pt's exam: impaired sensation/tingling/decreased to light touch in bilat LE, impaired coordination/motor planning, impaired LE tone, impaired LE strength, impaired sitting balance, and postural control.  Pt is currently unable to stand or ambulate and is dependent upon wife for transfers with hoyer and mobility in tilt in space manual wheelchair.  Pt would benefit from skilled PT to address these impairments and functional limitations to maximize functional mobility independence and reduce falls risk.    Personal Factors and Comorbidities Comorbidity 3+;Past/Current Experience;Time since  onset of injury/illness/exacerbation    Comorbidities Chest pain, dissection of descending thoracic aorta, aneurysm of artery, a-fib, HTN, systolic CHF, oral phase dysphagia, sacral wound, PNA    Examination-Activity Limitations Bed Mobility;Bend;Locomotion Level;Sit;Stairs;Stand;Transfers;Lift;Hygiene/Grooming;Dressing;Toileting;Self Feeding;Reach Overhead;Bathing    Examination-Participation Restrictions Community Activity;Driving;Yard Work    Conservation officer, historic buildings Evolving/Moderate complexity    Clinical Decision Making Moderate    Rehab Potential Good    PT Frequency 2x / week    PT Duration 8 weeks    PT Treatment/Interventions ADLs/Self Care Home Management;DME Instruction;Gait training;Stair training;Functional mobility training;Therapeutic activities;Therapeutic exercise;Balance training;Neuromuscular re-education;Wheelchair mobility training;Orthotic Fit/Training;Patient/family  education;Energy conservation    PT Next Visit Plan MONITOR BP: Keep SBP between 120-130!   Hoyer to mat - supine - look at rolling and supine <> sit; sitting balance edge of mat; HEP to work on LE strengthening.  Sit > stand training.  Slideboard training as appropriate.  SCI Fit in wheelchair with LE only?    Consulted and Agree with Plan of Care Patient;Family member/caregiver    Family Member Consulted wife             Patient will benefit from skilled therapeutic intervention in order to improve the following deficits and impairments:  Cardiopulmonary status limiting activity, Decreased activity tolerance, Decreased balance, Decreased coordination, Decreased endurance, Decreased mobility, Decreased strength, Difficulty walking, Impaired sensation, Impaired tone, Impaired UE functional use, Postural dysfunction, Pain, Abnormal gait  Visit Diagnosis: Transient paralysis  Muscle weakness (generalized)  Other disturbances of skin sensation  Unsteadiness on feet  Other abnormalities of gait and mobility     Problem List Patient Active Problem List   Diagnosis Date Noted   Acute on chronic respiratory failure with hypoxia (HCC)    Acute inflammatory demyelinating polyneuropathy (HCC)    Dysautonomia (HCC)    Atrial fibrillation with RVR (HCC)    Healthcare associated bacterial pneumonia     Dierdre Highman, PT, DPT 03/19/21    10:07 PM    [facility] [address] [place], [place], [address] Phone: [phone]   Fax:  [phone]  Name: Selmer Adduci MRN: [id number] Date of Birth: Nov 11, 1955

## 2021-03-20 ENCOUNTER — Encounter: Payer: Self-pay | Admitting: Speech Pathology

## 2021-03-20 ENCOUNTER — Encounter: Payer: Medicare Other | Admitting: Occupational Therapy

## 2021-03-20 NOTE — Therapy (Signed)
[facility] [address] [place], [place], [address] Phone: [phone]   Fax:  [phone]  Speech Language Pathology Treatment  Patient Details  Name: Brian Espinoza MRN: [id number] Date of Birth: 27-Mar-1956 Referring Provider (SLP): Alison Murray MD   Encounter Date: 03/19/2021   End of Session - 03/20/21 1459     Visit Number 1    Number of Visits 17    Date for SLP Re-Evaluation 05/19/21    SLP Start Time 1330   Physical therapist was 15 minutes late; SLP unable to complete full evaluation.   SLP Stop Time  1400    SLP Time Calculation (min) 30 min    Activity Tolerance Patient tolerated treatment well             Past Medical History:  Diagnosis Date   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 No pertinent surgical history.  There were no vitals filed for this visit.   Subjective Assessment - 03/20/21 1454     Subjective Pt was pleasant and cooperative throughout assessment.    Currently in Pain? No/denies    Pain Score 4     Pain Location Buttocks    Pain Descriptors / Indicators Discomfort               SLP Evaluation [facility] - 03/20/21 1501       SLP Visit Information   SLP Received On 03/19/21    Referring Provider (SLP) Alison Murray MD    Onset Date 04/2020    Medical Diagnosis AIDP/GBS      Pain Assessment   Currently in Pain? 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      Balance Screen   Has the patient fallen in the past 6 months No    Has the patient had a decrease in activity level because of a fear of falling?  No      Prior Functional Status   Cognitive/Linguistic Baseline Within functional limits    Type of Home House     Lives With Spouse      Cognition   Overall Cognitive Status Impaired/Different from baseline   Unable to assess this session; however, previous therapist seeing for higher-level cognitive goals.     Auditory Comprehension   Overall Auditory Comprehension Appears within functional limits for tasks assessed      Verbal Expression   Overall Verbal Expression Appears within functional limits for tasks assessed      Oral Motor/Sensory Function   Overall Oral Motor/Sensory Function Impaired    Labial ROM Within Functional Limits    Labial Symmetry Within Functional Limits    Labial Strength Reduced    Lingual Strength Reduced    Velum Impaired right  Motor Speech   Overall Motor Speech Impaired    Respiration Impaired    Level of Impairment Sentence    Phonation Breathy;Low vocal intensity;Other (comment)   Stridor   Articulation Impaired    Level of Impairment Sentence    Intelligibility Intelligibility reduced    Word 75-100% accurate    Phrase 75-100% accurate    Sentence 75-100% accurate    Conversation 75-100% accurate    Motor Planning Witnin functional limits    Effective Techniques Over-articulate;Pause;Pacing;Slow rate    Phonation Impaired    Volume Soft            Liquid trials   Thin   [facility] Swallow Protocol completed 3 oz water by cup and straw observed No overt s/sx of aspiration noted with consecutive drinks.    Solid  trials  Regular Throat clear in 1/3 trials No other overt s/sx of aspiration noted.    Recommendations:  After completion of CSE, SLP rec continuation of current diet, regular and thin liquids. Rec safe swallow strategies and f/u with primary SLP if any changes note. **Unable to locate pt's previous MBS on the EMR. SLP reached out to previous therapist for update.       SLP Education - 03/20/21 1458     Education Details SLP role with GBS in speech, swallowing, cognition    Person(s) Educated Patient;Spouse    Methods Explanation;Demonstration    Comprehension Verbalized understanding;Need further instruction              SLP Short Term Goals - 03/20/21 1533       SLP SHORT TERM GOAL #1   Title Pt will imitate appropriate phrasing and breath coordination in a 18/20 sentences given <2 verbal cues across 3 session.    Time 4    Period Weeks    Status New    Target Date 04/03/21      SLP SHORT TERM GOAL #2   Title Patient will use the (over articulation, slowed rate) strategies to improve speech intelligibility in 18/20 sentence given <2 verbal cues across 3 sessions.    Time 4    Period Weeks    Status New    Target Date 04/17/21      SLP SHORT TERM GOAL #3   Title Assess cognition    Time 1    Period Weeks    Status New    Target Date 03/27/21              SLP Long Term Goals - 03/20/21 1528       SLP LONG TERM GOAL #1   Title Pt will imitate appropriate phrasing and breath coordination in a 20 minute conversation across 3 sessions given <1 verbal cue.    Time 8    Period Weeks    Status New    Target Date 05/15/21      SLP LONG TERM GOAL #2   Title Patient will use the (over articulation, slowed rate) strategies to improve speech intelligibility in a 20 min conversation across 3 session given <1 verbal cue.    Time 8    Period Weeks    Status New    Target Date 05/15/21              Plan - 03/20/21 1501     Clinical Impression Statement  Pt is a 65 yo male who presents for OP ST evaluation post GBS diagnosis. Pt has a notably complex medical hx with several hospital  stays (more info in H&P). Pt was referred from [facility] in [place]; trach was removed at [facility] and pt was discharged on regular and thin diet. Pt and wife do not report any overt s/sx of aspiration (cough,throat clears, gurgly vocal quality) at home. SLP completed clinical swallow evaluation (CSE) (see note for PO trials). SLP rec continuation of current diet with safe swallow precautions. Intelligibility during conversation was decreased due to poor respiratory support and oral weakness. SLP to provide strategies for mild dysarthria (over articulation, slow rate, pausing, pacing, etc) to decrease communicative breakdowns. Pt demonstrated impaired vocal quality with stridor noted. SLP contacted doctor Alison Murray) for ENT referral. Due to time constraints, SLP was unable to assess cognition. Rec f/u next session. SLP rec skilled speech services to address articulation and respiration. *Currently, pt is not a candidate for RMST due to cardiac precautions. Pt and wife in agreement.    Speech Therapy Frequency 2x / week    Duration 8 weeks    Treatment/Interventions Aspiration precaution training;Environmental controls;SLP instruction and feedback;Compensatory techniques;Functional tasks;Compensatory strategies;Internal/external aids;Multimodal communcation approach;Patient/family education    Potential to Achieve Goals Good    Consulted and Agree with Plan of Care Patient;Family member/caregiver    Family Member Consulted Wife             Patient will benefit from skilled therapeutic intervention in order to improve the following deficits and impairments:   Dysarthria and anarthria  Dysphagia, oropharyngeal phase    Problem List Patient Active Problem List   Diagnosis Date Noted   Acute on chronic respiratory failure with hypoxia (HCC)     Acute inflammatory demyelinating polyneuropathy (HCC)    Dysautonomia (HCC)    Atrial fibrillation with RVR [facility])    Healthcare associated bacterial pneumonia     Dorena Bodo MS, [place], CBIS  03/20/2021, 3:56 PM  [facility] [address] [place], [place], [address] Phone: [phone]   Fax:  [phone]   Name: Shellie Goettl MRN: [id number] Date of Birth: [birth year]

## 2021-03-22 ENCOUNTER — Ambulatory Visit: Payer: Medicare Other | Admitting: Occupational Therapy

## 2021-03-22 ENCOUNTER — Encounter: Payer: Self-pay | Admitting: Occupational Therapy

## 2021-03-22 ENCOUNTER — Other Ambulatory Visit: Payer: Self-pay

## 2021-03-22 DIAGNOSIS — M6281 Muscle weakness (generalized): Secondary | ICD-10-CM

## 2021-03-22 DIAGNOSIS — R208 Other disturbances of skin sensation: Secondary | ICD-10-CM

## 2021-03-22 DIAGNOSIS — R293 Abnormal posture: Secondary | ICD-10-CM

## 2021-03-22 DIAGNOSIS — M25641 Stiffness of right hand, not elsewhere classified: Secondary | ICD-10-CM

## 2021-03-22 DIAGNOSIS — R278 Other lack of coordination: Secondary | ICD-10-CM

## 2021-03-22 DIAGNOSIS — R2681 Unsteadiness on feet: Secondary | ICD-10-CM

## 2021-03-22 DIAGNOSIS — R295 Transient paralysis: Secondary | ICD-10-CM | POA: Diagnosis not present

## 2021-03-22 DIAGNOSIS — M25642 Stiffness of left hand, not elsewhere classified: Secondary | ICD-10-CM

## 2021-03-22 NOTE — Therapy (Signed)
[facility] [address] [place], [place], [address] Phone: [phone]   Fax:  [phone]  Occupational Therapy Evaluation  Patient Details  Name: Brian Espinoza MRN: [id number] Date of Birth: 02/19/1956 Referring Provider (OT): Shirlee Latch (Will send certification to PCP Venia Minks [facility])   Encounter Date: 03/22/2021   OT End of Session - 03/22/21 1817     Visit Number 1    Number of Visits 37    Date for OT Re-Evaluation 06/20/21    Authorization Type Medicare and Federal BCBS    Progress Note Due on Visit 10    OT Start Time 1230    OT Stop Time 1315    OT Time Calculation (min) 45 min    Behavior During Therapy WFL for tasks assessed/performed             Past Medical History:  Diagnosis Date   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 No pertinent surgical history.  There were no vitals filed for this visit.   Subjective Assessment - 03/22/21 1806     Subjective  I would like to be able to scratch an itch    Patient is accompanied by: Family member   wife Brian Espinoza   Currently in Pain? No/denies    Pain Score 0-No pain               [facility] OT Assessment - 03/22/21 0001       Assessment   Medical Diagnosis Guillain Barre    Referring Provider (OT) Shirlee Latch   Will send certification to PCP Venia Minks - [facility]   Onset Date/Surgical Date 03/14/21    Hand Dominance Right    Next MD Visit 03/29/21    Prior Therapy Acute care, [facility], [facility], SNF      Precautions   Precaution Comments Monitor systolic BP: keep between 120-130; Chest pain, dissection of descending thoracic aorta, aneurysm of artery, a-fib, HTN, systolic CHF, oral phase dysphagia, sacral  wound, PNA    Other Brace/Splint has multiple resting hand splints      Restrictions   Weight Bearing Restrictions No      Prior Function   Level of Independence Independent with basic ADLs;Independent with community mobility without device    Vocation Retired    Designer, multimedia, playing with granchildren      ADL   Eating/Feeding + 1  Total assistance    Grooming + 1 Total assistance    Upper Body Bathing + 1 Total asssestance    Lower Body Bathing + 1 Total assistance    Upper Body Dressing + 1 Total assistance    Lower Body Dressing +1 Total aassistance    Toilet Transfer + 1 Total assistance    Toileting - Clothing Manipulation + 1 Total assistance    Toileting -  Hygiene + 1 Total assistance    Tub/Shower Transfer --   Has not attempted   ADL comments Patient is completely dependent for all aspects of BADL due to decreased postural control and limited functional use of BUE/Hands      Written Expression   Dominant Hand Right    Handwriting --   unable     Vision - History   Baseline Vision  Wears glasses all the time    Additional Comments cataract surgery both eyes      Vision Assessment   Eye Alignment Within Functional Limits      Activity Tolerance   Activity Tolerance Comments Unable to tolerate upright in wheelchair for long periods due to sacral wound/pain      Cognition   Overall Cognitive Status Within Functional Limits for tasks assessed    Cognition Comments No deficits noted during initial interview      Observation/Other Assessments   Focus on Therapeutic Outcomes (FOTO)  NA      Posture/Postural Control   Posture/Postural Control Postural limitations    Posture Comments Tilt in space wheelchair.  Self reports lacking sufficient control to sit on tub bench, or assist with slide board transfers      Sensation   Light Touch Impaired by gross assessment   reports some numbness, can identify light touch.  Decreased sensation in thumbs     Coordination    Gross Motor Movements are Fluid and Coordinated No    Fine Motor Movements are Fluid and Coordinated No    Coordination and Movement Description Has controlled shoulder flexion, uses momentum for elbow flex/ext, forearm movement.    Box and Blocks unable      Perception   Perception Within Functional Limits      Tone   Assessment Location Right Upper Extremity;Left Upper Extremity      ROM / Strength   AROM / PROM / Strength AROM;Strength      AROM   Overall AROM  Deficits    AROM Assessment Site Shoulder    Right/Left Shoulder Right;Left    Right Shoulder Flexion 120 Degrees    Right Shoulder ABduction 125 Degrees    Left Shoulder Flexion 125 Degrees    Left Shoulder ABduction 130 Degrees      Strength   Overall Strength Deficits    Overall Strength Comments elbow flex 3+/5, ext 3/5      RUE Tone   RUE Tone Hypotonic      LUE Tone   LUE Tone Hypotonic                              OT Education - 03/22/21 1817     Education Details potential OT plan of care    Person(s) Educated Patient;Spouse    Methods Explanation    Comprehension Verbalized understanding              OT Short Term Goals - 03/22/21 1828       OT SHORT TERM GOAL #1   Title Patient and caregiver will complete a home exercise program designed to improve strength in proximal UE's - shoulders and elbows    Time 4    Period Weeks    Status New    Target Date 04/21/21      OT SHORT TERM GOAL #2   Title Following set up patient will hold a cup with lid and long straw in midline to allow him to take a sip on his own    Time 4    Period Weeks    Status New      OT SHORT TERM GOAL #3   Title While seated in upright supported position, patient will reach forward to make contact with upright target on table top (chest height) x 5 each arm    Time 4    Period Weeks  Status New      OT SHORT TERM GOAL #4   Title Patient will be able to reach toward face with either  right or left hand in preparation to scratch chin/cheek    Time 4    Period Weeks    Status New      OT SHORT TERM GOAL #5   Title Patient will utilize cylindrical grip to hold small water bottle in one hand.    Time 4    Period Weeks    Status New               OT Long Term Goals - 03/22/21 1840       OT LONG TERM GOAL #1   Target Date 06/20/21                   Plan - 03/22/21 1818     Clinical Impression Statement Pt is a 65 year old male referred to Neuro OPOT for evaluation of GBS.  Pt's PMH is significant for the following: AIDP, Chest pain, dissection of descending thoracic aorta, aneurysm of artery, a-fib, HTN, systolic CHF, oral phase dysphagia, sacral wound, PNA.  The following deficits were noted during pt's exam: impaired sensation/tingling in bilat hands/thumbs, impaired coordination/motor planning, hypotonicity BUE, hypertonicity BLE, significantly impaired UE/LE strength, impaired postural control/strength, sitting balance, and limited range of motion in bilateral hands - specifically PIP/DIP all of which impede his ability to participate in any aspect of Basic ADL.  Pt would benefit from skilled OT to address these impairments  and to decrease level of assistance needed for, and ability to particiapte in basic self care skills.    OT Occupational Profile and History Comprehensive Assessment- Review of records and extensive additional review of physical, cognitive, psychosocial history related to current functional performance    Occupational performance deficits (Please refer to evaluation for details): ADL's;IADL's;Rest and Sleep;Leisure    Body Structure / Function / Physical Skills ADL;Decreased knowledge of use of DME;Strength;Balance;Dexterity;GMC;Pain;Tone;Body mechanics;Proprioception;UE functional use;Cardiopulmonary status limiting activity;Endurance;IADL;ROM;Fascial restriction;Improper spinal/pelvic  alignment;Coordination;Flexibility;Mobility;Sensation;Wound;FMC;Muscle spasms;Skin integrity    Clinical Decision Making Multiple treatment options, significant modification of task necessary    Comorbidities Affecting Occupational Performance: Presence of comorbidities impacting occupational performance    Comorbidities impacting occupational performance description: AIDP, Chronic back pain, Descending thoracic aortic anneurysm    Modification or Assistance to Complete Evaluation  Min-Moderate modification of tasks or assist with assess necessary to complete eval    OT Frequency 3x / week    OT Duration 12 weeks    OT Treatment/Interventions Self-care/ADL training;Moist Heat;Fluidtherapy;DME and/or AE instruction;Splinting;Balance training;Aquatic Therapy;Contrast Bath;Therapeutic activities;Ultrasound;Therapeutic exercise;Passive range of motion;Functional Mobility Training;Neuromuscular education;Electrical Stimulation;Manual Therapy;Patient/family education;Energy conservation    Plan Need to consider splint options for BUE - has good range wrists/MCP - PIP tight.  Would love to see him out of the chair to address sitting balance - hoyer lift vs slide board due to sacral wound.  Review prior UE stretches/ exercise programs    Consulted and Agree with Plan of Care Patient;Family member/caregiver    Family Member Consulted wife Brian Espinoza             Patient will benefit from skilled therapeutic intervention in order to improve the following deficits and impairments:   Body Structure / Function / Physical Skills: ADL, Decreased knowledge of use of DME, Strength, Balance, Dexterity, GMC, Pain, Tone, Body mechanics, Proprioception, UE functional use, Cardiopulmonary status limiting activity, Endurance, IADL, ROM, Fascial restriction, Improper spinal/pelvic alignment, Coordination,  Flexibility, Mobility, Sensation, Wound, FMC, Muscle spasms, Skin integrity       Visit Diagnosis: Abnormal posture -  Plan: Ot plan of care cert/re-cert  Muscle weakness (generalized) - Plan: Ot plan of care cert/re-cert  Other disturbances of skin sensation - Plan: Ot plan of care cert/re-cert  Unsteadiness on feet - Plan: Ot plan of care cert/re-cert  Stiffness of left hand, not elsewhere classified - Plan: Ot plan of care cert/re-cert  Stiffness of right hand, not elsewhere classified - Plan: Ot plan of care cert/re-cert  Other lack of coordination - Plan: Ot plan of care cert/re-cert    Problem List Patient Active Problem List   Diagnosis Date Noted   Acute on chronic respiratory failure with hypoxia (HCC)    Acute inflammatory demyelinating polyneuropathy (HCC)    Dysautonomia (HCC)    Atrial fibrillation with RVR (HCC)    Healthcare associated bacterial pneumonia     Collier Salina, OT/L 03/22/2021, 6:41 PM  [facility] [address] [place], [place], [address] Phone: [phone]   Fax:  [phone]  Name: Emersen Carroll MRN: [id number] Date of Birth: May 06, 1956

## 2021-04-02 ENCOUNTER — Encounter: Payer: Medicare Other | Admitting: Occupational Therapy

## 2021-04-02 ENCOUNTER — Other Ambulatory Visit: Payer: Self-pay

## 2021-04-02 ENCOUNTER — Ambulatory Visit: Payer: Medicare Other | Attending: Cardiovascular Disease | Admitting: Physical Therapy

## 2021-04-02 VITALS — BP 124/53 | HR 54

## 2021-04-02 DIAGNOSIS — R278 Other lack of coordination: Secondary | ICD-10-CM | POA: Insufficient documentation

## 2021-04-02 DIAGNOSIS — M25642 Stiffness of left hand, not elsewhere classified: Secondary | ICD-10-CM | POA: Insufficient documentation

## 2021-04-02 DIAGNOSIS — R295 Transient paralysis: Secondary | ICD-10-CM | POA: Diagnosis present

## 2021-04-02 DIAGNOSIS — R208 Other disturbances of skin sensation: Secondary | ICD-10-CM | POA: Diagnosis present

## 2021-04-02 DIAGNOSIS — M6281 Muscle weakness (generalized): Secondary | ICD-10-CM | POA: Insufficient documentation

## 2021-04-02 DIAGNOSIS — M25641 Stiffness of right hand, not elsewhere classified: Secondary | ICD-10-CM | POA: Diagnosis present

## 2021-04-02 DIAGNOSIS — R2681 Unsteadiness on feet: Secondary | ICD-10-CM | POA: Insufficient documentation

## 2021-04-02 DIAGNOSIS — R293 Abnormal posture: Secondary | ICD-10-CM | POA: Insufficient documentation

## 2021-04-02 DIAGNOSIS — R2689 Other abnormalities of gait and mobility: Secondary | ICD-10-CM | POA: Insufficient documentation

## 2021-04-02 NOTE — Therapy (Signed)
[facility] [address] [place], [place], [address] Phone: [phone]   Fax:  [phone]  Physical Therapy Treatment  Patient Details  Name: Brian Espinoza MRN: [id number] Date of Birth: [birth year] Referring Provider (PT): Alison Murray, MD   Encounter Date: 04/02/2021   PT End of Session - 04/02/21 2050     Visit Number 2    Number of Visits 17    Date for PT Re-Evaluation 05/18/21    Authorization Type Medicare A&B; BCBS - 10th visit PN    Progress Note Due on Visit 10    PT Start Time 1530    PT Stop Time 1615    PT Time Calculation (min) 45 min    Equipment Utilized During Treatment Other (comment)   hoyer lift, slideboard   Activity Tolerance Patient tolerated treatment well    Behavior During Therapy WFL for tasks assessed/performed             Past Medical History:  Diagnosis Date   Acute inflammatory demyelinating polyneuropathy (HCC)    Acute on chronic respiratory failure with hypoxia (HCC)    Atrial fibrillation with RVR (HCC)    Dysautonomia (HCC)    Healthcare associated bacterial pneumonia     No past surgical history on file.  Vitals:   04/02/21 1536  BP: (!) 124/53  Pulse: (!) 54     Subjective Assessment - 04/02/21 1535     Subjective Wound is getting better but is hurting due to sitting in chair for prolonged period of time.  Is anxious to do PT and OT; doesn't feel he needs ST at this time.    Patient is accompained by: Family member    Pertinent History Chest pain, dissection of descending thoracic aorta, aneurysm of artery, a-fib, HTN, systolic CHF, oral phase dysphagia, sacral wound, PNA    Patient Stated Goals Improve hand function and walk to be more independent with daily activities.  Gain strength in legs to be able to lift hips and reduce shear on sacrum.    Currently in Pain? Yes               [facility] Adult PT Treatment/Exercise - 04/02/21 1547       Bed Mobility    Bed Mobility Rolling Left;Rolling Right    Rolling Right Set up assist;Supervision/verbal cueing   Pt demonstrates some extensor spasm in BLE and trunk while trying to roll.   Rolling Left Set up assist;Supervision/Verbal cueing      Transfers   Transfers Supine to Sit;Sit to Stand;Stand to Sit;Lateral/Scoot Transfers    Sit to Stand 1: +2 Total assist;With upper extremity assist;From elevated surface    Sit to Stand Details (indicate cue type and reason) With UE around PT and SPT; performed from elevated mat x 2-3 reps with cues for full anterior lean to transition COG over BOS and cues to push into floor with feet due to absent sensation in feet.  Once standing pt noted to have intermittent episodes of knee buckling and required therapist's assistance to maintain knee extension.    Stand to Sit 1: +2 Total assist    Lateral/Scoot Transfers 1: +2 Total assist    Lateral/Scoot Transfer Details (indicate cue type and reason) Attempted to perform squat scoots mat > w/c with slideboard as a bridge; bilat UE over therapist and SPT, cues for anterior lean and pushing through LE to lift hips and pivot to R.  As pt fatigued he required complete  assistance to lift hips and advance into chair.  Once in w/c, +2 to position hips and use of tilt for positioning and pressure relief.    Supine to Sit 3: Mod assist   PT assisted w/ set up to push up once pt was in sidelying. PT provided verbal cues to push up and let feet fall at the same time. PT continued to check on pt's sacral wound symptoms.   Supine to Sit Details (indicate cue type and reason) Cues for sequencing and to keep weight shifting forwards during side > sit; pt unable to fully push upright through UE    Transfer via Programmer, multimedia Allowed pt time to suspend in hoyer to relieve pressure on sacrum before transitioning to mat    Comments PT worked w/ pt on being able to shift his weight from sitting <> lateral leans on  forearm to facilitate supine to sit transfers. PT provided min assist to maintain pt upright. Pt performed x5 each side.  Provided cues to place wrist/palm down with fingers flexed instead of pushing through dorsal surface of wrist; pt reported some stretching of muscles on flexor side of wrist.  Pt also reported increased tightness in hips with lateral leans to one elbow.                       PT Short Term Goals - 03/19/21 2153       PT SHORT TERM GOAL #1   Title Pt and wife will demonstrate ability to perform initial supine and seated HEP    Time 4    Period Weeks    Status New    Target Date 04/18/21      PT SHORT TERM GOAL #2   Title Pt will demonstrate ability to perform rolling and supine <> sit with moderate assistance    Time 4    Period Weeks    Status New    Target Date 04/18/21      PT SHORT TERM GOAL #3   Title Pt will demonstrate ability to perform slideboard on level surfaces w/c <> mat with 50% assistance from PT    Baseline total A in hoyer    Time 4    Period Weeks    Status New    Target Date 04/18/21      PT SHORT TERM GOAL #4   Title Pt will tolerate 5 minutes sustained, supported standing (standing frame or SARA) while performing weight shifting, LE activation training, etc    Time 4    Period Weeks    Status New    Target Date 04/18/21               PT Long Term Goals - 03/19/21 2200       PT LONG TERM GOAL #1   Title Pt and wife will demonstrate independence with progressed HEP    Time 8    Period Weeks    Status New    Target Date 05/18/21      PT LONG TERM GOAL #2   Title Pt will demonstrate ability to perform rolling and supine <> sit with minimal assistance    Time 8    Period Weeks    Status New    Target Date 05/18/21      PT LONG TERM GOAL #3   Title Pt will perform slideboard transfers w/c <> mat on level surface Min A and will  begin to demonstrate ability to perform squat-scooting across board    Time 8     Period Weeks    Status New    Target Date 05/18/21      PT LONG TERM GOAL #4   Title Pt will demonstrate ability to perform sit > stand from elevated surface with +2 assistance and UE support    Time 8    Period Weeks    Status New    Target Date 05/18/21                   Plan - 04/02/21 2051     Clinical Impression Statement Pt demonstrated improved tolerance to activity today with decreased sacral pain; pt able to perform rolling, supine > sit, and sitting balance activities in more upright position without back support.  Pt able to perform sit > stand from mat with +2 A and stand for 10 seconds before LE fatigued.  Attempted to perform squat scooting mat > w/c with slideboard as a bridge but due to LE fatigue, pt unable to fully lift hips from mat during scooting and required total A +2 to transfer back into w/c without hoyer.    Personal Factors and Comorbidities Comorbidity 3+;Past/Current Experience;Time since onset of injury/illness/exacerbation    Comorbidities Chest pain, dissection of descending thoracic aorta, aneurysm of artery, a-fib, HTN, systolic CHF, oral phase dysphagia, sacral wound, PNA    Examination-Activity Limitations Bed Mobility;Bend;Locomotion Level;Sit;Stairs;Stand;Transfers;Lift;Hygiene/Grooming;Dressing;Toileting;Self Feeding;Reach Overhead;Bathing    Examination-Participation Restrictions Community Activity;Driving;Yard Work    Conservation officer, historic buildings Evolving/Moderate complexity    Rehab Potential Good    PT Frequency 2x / week    PT Duration 8 weeks    PT Treatment/Interventions ADLs/Self Care Home Management;DME Instruction;Gait training;Stair training;Functional mobility training;Therapeutic activities;Therapeutic exercise;Balance training;Neuromuscular re-education;Wheelchair mobility training;Orthotic Fit/Training;Patient/family education;Energy conservation    PT Next Visit Plan MONITOR BP: Keep SBP between 120-130!   Hoyer w/c <>  mat; initiate supine/seated HEP to work on LE strengthening.  Sit > stand training if +2.  Slideboard training as appropriate.  SCI Fit in wheelchair with LE only?    Consulted and Agree with Plan of Care Patient;Family member/caregiver    Family Member Consulted wife             Patient will benefit from skilled therapeutic intervention in order to improve the following deficits and impairments:  Cardiopulmonary status limiting activity, Decreased activity tolerance, Decreased balance, Decreased coordination, Decreased endurance, Decreased mobility, Decreased strength, Difficulty walking, Impaired sensation, Impaired tone, Impaired UE functional use, Postural dysfunction, Pain, Abnormal gait  Visit Diagnosis: Muscle weakness (generalized)  Other disturbances of skin sensation  Unsteadiness on feet  Other abnormalities of gait and mobility  Transient paralysis     Problem List Patient Active Problem List   Diagnosis Date Noted   Acute on chronic respiratory failure with hypoxia (HCC)    Acute inflammatory demyelinating polyneuropathy (HCC)    Dysautonomia (HCC)    Atrial fibrillation with RVR (HCC)    Healthcare associated bacterial pneumonia    Dierdre Highman, PT, DPT 04/02/21    9:17 PM    [facility] [address] [place], [place], [address] Phone: [phone]   Fax:  [phone]  Name: Jovon Winterhalter MRN: [id number] Date of Birth: 13-Sep-1955

## 2021-04-04 ENCOUNTER — Encounter: Payer: Medicare Other | Admitting: Speech Pathology

## 2021-04-04 ENCOUNTER — Ambulatory Visit: Payer: Medicare Other

## 2021-04-04 ENCOUNTER — Other Ambulatory Visit: Payer: Self-pay

## 2021-04-04 ENCOUNTER — Encounter: Payer: Self-pay | Admitting: Occupational Therapy

## 2021-04-04 ENCOUNTER — Ambulatory Visit: Payer: Medicare Other | Admitting: Occupational Therapy

## 2021-04-04 DIAGNOSIS — M25642 Stiffness of left hand, not elsewhere classified: Secondary | ICD-10-CM

## 2021-04-04 DIAGNOSIS — M6281 Muscle weakness (generalized): Secondary | ICD-10-CM

## 2021-04-04 DIAGNOSIS — R293 Abnormal posture: Secondary | ICD-10-CM

## 2021-04-04 DIAGNOSIS — R2681 Unsteadiness on feet: Secondary | ICD-10-CM

## 2021-04-04 DIAGNOSIS — M25641 Stiffness of right hand, not elsewhere classified: Secondary | ICD-10-CM

## 2021-04-04 DIAGNOSIS — R208 Other disturbances of skin sensation: Secondary | ICD-10-CM

## 2021-04-04 DIAGNOSIS — R2689 Other abnormalities of gait and mobility: Secondary | ICD-10-CM

## 2021-04-04 DIAGNOSIS — R278 Other lack of coordination: Secondary | ICD-10-CM

## 2021-04-04 NOTE — Therapy (Signed)
[facility] [address] [place], [place], [address] Phone: [phone]   Fax:  [phone]  Physical Therapy Treatment  Patient Details  Name: Brian Espinoza MRN: [id number] Date of Birth: 02-26-1956 Referring Provider (PT): Alison Murray, MD   Encounter Date: 04/04/2021   PT End of Session - 04/04/21 1020     Visit Number 3    Number of Visits 17    Date for PT Re-Evaluation 05/18/21    Authorization Type Medicare A&B; BCBS - 10th visit PN    Progress Note Due on Visit 10    PT Start Time 1018    PT Stop Time 1105    PT Time Calculation (min) 47 min    Equipment Utilized During Treatment Other (comment)   hoyer lift   Activity Tolerance Patient tolerated treatment well    Behavior During Therapy WFL for tasks assessed/performed             Past Medical History:  Diagnosis Date   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 No pertinent surgical history.  There were no vitals filed for this visit.   Subjective Assessment - 04/04/21 1020     Subjective Pt denies any new issues. Bottom is bothering him. It feels numb.    Patient is accompained by: Family member    Pertinent History Chest pain, dissection of descending thoracic aorta, aneurysm of artery, a-fib, HTN, systolic CHF, oral phase dysphagia, sacral wound, PNA    Patient Stated Goals Improve hand function and walk to be more independent with daily activities.  Gain strength in legs to be able to lift hips and reduce shear on sacrum.    Currently in Pain? Yes    Pain Score 5     Pain Location Buttocks    Pain Descriptors / Indicators Numbness    Pain Type Chronic pain    Pain Onset More than a month ago    Pain Frequency Constant                                [facility] Adult PT Treatment/Exercise - 04/04/21 1021       Bed Mobility   Bed Mobility Rolling Right;Rolling Left;Sit to Supine;Left Sidelying to Sit    Rolling Right Set up assist;Contact Guard/Touching assist    Rolling Left Set up assist;Contact Guard/Touching assist    Left Sidelying to Sit Moderate Assistance - Patient 50-74%   Pt needed some help to position feet correctly off edge of mat then mod assist to get left elbow under him to push up. Cued to push with right hand on mat.   Sit to Supine Maximal Assistance - Patient 25-49%   at legs     Transfers   Transfers Sit to Stand;Stand to Sit    Sit to Stand 1: +2 Total assist    Sit to Stand Details Verbal cues for technique    Sit to Stand Details (indicate cue type and reason) Performed with PT and PT student on each side to block legs from elevated mat. Pt needed mod assist to rise and then extensor tone kicked in some with pt able to maintain full erect posture with legs braced against mat. PT and  student were blocking knees but no buckling during standing bout of about 30 sec. With return to sit pt's knees did buckle with therapists blocking them to help control.    Stand to Sit 1: +2 Total assist    Transfer via Counsellor Cueing Pt reported less pain in bottom when suspended in hoyer.    Comments Pt performed partial bridge with PT stabilizing legs to scoot over on mat min assist. To scoot edge of mat pt required mod assist from therapist to help bring hips forward when he leaned slightly to side to unweight some.      Neuro Re-ed    Neuro Re-ed Details  Pt performed sitting edge of mat with feet on floor: reaching for targets in front x 2 min CGA, coming down on forearm to each side x 2 then added in reaching across body with other arm for a cone to tap x 3 each side. Pt more challenged with coming up on right side needing min assist. BP=130/62 after activities.                        PT Short Term Goals - 03/19/21 2153       PT SHORT TERM GOAL #1   Title Pt and wife will demonstrate ability to perform initial supine and seated HEP    Time 4    Period Weeks    Status New    Target Date 04/18/21      PT SHORT TERM GOAL #2   Title Pt will demonstrate ability to perform rolling and supine <> sit with moderate assistance    Time 4    Period Weeks    Status New    Target Date 04/18/21      PT SHORT TERM GOAL #3   Title Pt will demonstrate ability to perform slideboard on level surfaces w/c <> mat with 50% assistance from PT    Baseline total A in hoyer    Time 4    Period Weeks    Status New    Target Date 04/18/21      PT SHORT TERM GOAL #4   Title Pt will tolerate 5 minutes sustained, supported standing (standing frame or SARA) while performing weight shifting, LE activation training, etc    Time 4    Period Weeks    Status New    Target Date 04/18/21               PT Long Term Goals - 03/19/21 2200       PT LONG TERM GOAL #1   Title Pt and wife will demonstrate independence with progressed HEP    Time 8    Period Weeks    Status New    Target Date 05/18/21      PT LONG TERM GOAL #2   Title Pt will demonstrate ability to perform rolling and supine <> sit with minimal assistance    Time 8    Period Weeks    Status New    Target Date 05/18/21      PT LONG TERM GOAL #3   Title Pt will perform slideboard transfers w/c <> mat on level surface Min A and will begin to demonstrate ability to perform squat-scooting across board    Time 8    Period Weeks    Status New    Target Date 05/18/21      PT LONG  TERM GOAL #4   Title Pt will demonstrate ability to perform sit > stand from elevated surface with +2 assistance and UE support    Time 8    Period Weeks    Status New    Target Date 05/18/21                   Plan - 04/04/21 1921     Clinical Impression Statement Pt did well in sitting today  with no increased sacral pain. Pt demonstrated improved ability to rise from mat today with upright posture and less knee buckling once upright locking knees out.    Personal Factors and Comorbidities Comorbidity 3+;Past/Current Experience;Time since onset of injury/illness/exacerbation    Comorbidities Chest pain, dissection of descending thoracic aorta, aneurysm of artery, a-fib, HTN, systolic CHF, oral phase dysphagia, sacral wound, PNA    Examination-Activity Limitations Bed Mobility;Bend;Locomotion Level;Sit;Stairs;Stand;Transfers;Lift;Hygiene/Grooming;Dressing;Toileting;Self Feeding;Reach Overhead;Bathing    Examination-Participation Restrictions Community Activity;Driving;Yard Work    Conservation officer, historic buildings Evolving/Moderate complexity    Rehab Potential Good    PT Frequency 2x / week    PT Duration 8 weeks    PT Treatment/Interventions ADLs/Self Care Home Management;DME Instruction;Gait training;Stair training;Functional mobility training;Therapeutic activities;Therapeutic exercise;Balance training;Neuromuscular re-education;Wheelchair mobility training;Orthotic Fit/Training;Patient/family education;Energy conservation    PT Next Visit Plan Bufford Lope- he is scheduled 3x/week but plan is 2x/week. Does this need to be updated? MONITOR BP: Keep SBP between 120-130!   Hoyer w/c <> mat; initiate supine/seated HEP to work on LE strengthening.  Sit > stand training if +2. Would it be possible to try more standing in // bars or in [place]? Not sure hands would be able to help much though.  Slideboard training as appropriate.  SCI Fit in wheelchair with LE only?    Consulted and Agree with Plan of Care Patient;Family member/caregiver    Family Member Consulted wife             Patient will benefit from skilled therapeutic intervention in order to improve the following deficits and impairments:  Cardiopulmonary status limiting activity, Decreased activity tolerance, Decreased balance,  Decreased coordination, Decreased endurance, Decreased mobility, Decreased strength, Difficulty walking, Impaired sensation, Impaired tone, Impaired UE functional use, Postural dysfunction, Pain, Abnormal gait  Visit Diagnosis: Other abnormalities of gait and mobility  Muscle weakness (generalized)     Problem List Patient Active Problem List   Diagnosis Date Noted   Acute on chronic respiratory failure with hypoxia (HCC)    Acute inflammatory demyelinating polyneuropathy (HCC)    Dysautonomia (HCC)    Atrial fibrillation with RVR (HCC)    Healthcare associated bacterial pneumonia     Ronn Melena, PT, DPT, NCS 04/04/2021, 7:29 PM  [facility] [address] [place], [place], [address] Phone: [phone]   Fax:  [phone]  Name: Brian Espinoza MRN: [id number] Date of Birth: 12-23-1955

## 2021-04-04 NOTE — Therapy (Signed)
[facility] [address] [place], [place], [address] Phone: [phone]   Fax:  [phone]  Occupational Therapy Treatment  Patient Details  Name: Brian Espinoza MRN: [id number] Date of Birth: 1955-08-29 Referring Provider (OT): Shirlee Latch (Will send certification to PCP Venia Minks [facility])   Encounter Date: 04/04/2021   OT End of Session - 04/04/21 1152     Visit Number 2    Number of Visits 37    Date for OT Re-Evaluation 06/20/21    Authorization Type Medicare and Federal BCBS    Progress Note Due on Visit 10    OT Start Time 1150    OT Stop Time 1230    OT Time Calculation (min) 40 min    Activity Tolerance Patient tolerated treatment well    Behavior During Therapy WFL for tasks assessed/performed             Past Medical History:  Diagnosis Date   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 No pertinent surgical history.  There were no vitals filed for this visit.   Subjective Assessment - 04/04/21 1152     Subjective  "my butt is killing me"    Currently in Pain? Yes    Pain Score 7     Pain Location Buttocks    Pain Descriptors / Indicators Numbness    Pain Type Chronic pain    Pain Onset More than a month ago    Pain Frequency Constant                          OT Treatments/Exercises (OP) - 04/04/21 1415       Transfers   Comments Hoyer transfer x 2 people      ADLs   Bathing pt simulated bathing upper body while seated edge of mat with SBA - no LOB while completing task. Pt was also able to reach to top of head with BUE and simulate washing and brushing hair without LOB.    Functional Mobility worked on sitting balance at edge of mat for progression  towards sitting for bathing and dressing and increasing independence with ADLs and IADLs.      Neurological Re-education Exercises   Other Exercises 1 Pt engaged in AAROM and isometric holds for BUE in supine with mod resistance at time and able to hold position at shoulder flexion with shoulder stabilized on mat. Pt transitioned to sitting edge of mat with max-total assistance x 2. Pt worked on maintaining sitting balance at edge of mat with bilateral UE support, unilateral UE support and no UE support. Pt was able to maintain static sitting balance without hands on assistance for short periods of time. Pt worked on reaching across midline for challenging balance and for slight weight shifts into BUE (respectively) for increasing BUE hand extension and increased stretch. Pt able to maintain sitting balance with anterior and posterior trunk lean without LOB. Pt worked on development of tenodesis with BUE for grasp and release of 1 inch blocks. This was novel for patient and patient was able to pick up 1 inch block and drop into bowl and moderate difficulty and drops.  OT Short Term Goals - 03/22/21 1828       OT SHORT TERM GOAL #1   Title Patient and caregiver will complete a home exercise program designed to improve strength in proximal UE's - shoulders and elbows    Time 4    Period Weeks    Status New    Target Date 04/21/21      OT SHORT TERM GOAL #2   Title Following set up patient will hold a cup with lid and long straw in midline to allow him to take a sip on his own    Time 4    Period Weeks    Status New      OT SHORT TERM GOAL #3   Title While seated in upright supported position, patient will reach forward to make contact with upright target on table top (chest height) x 5 each arm    Time 4    Period Weeks    Status New      OT SHORT TERM GOAL #4   Title Patient will be able to reach toward face with either right or left hand in preparation to  scratch chin/cheek    Time 4    Period Weeks    Status New      OT SHORT TERM GOAL #5   Title Patient will utilize cylindrical grip to hold small water bottle in one hand.    Time 4    Period Weeks    Status New               OT Long Term Goals - 04/04/21 1414       OT LONG TERM GOAL #1   Title Patient will complete an updated stretching program to prevent contracture in digits    Time 12    Period Weeks    Status New      OT LONG TERM GOAL #2   Title Patient and caregiver will demonstrate awareness of splint wear and care for BUE    Time 12    Period Weeks    Status New      OT LONG TERM GOAL #3   Title Patient will wash his face and upper body and upper legs with min assist    Time 12    Period Weeks    Status New      OT LONG TERM GOAL #4   Title Patient will actively bridge in bed to assist with pericare as needed    Time 12    Period Weeks    Status New      OT LONG TERM GOAL #5   Title Patient will transfer to adapted commode with max assist in prep for toileting    Time 12    Period Weeks    Status New      OT LONG TERM GOAL #6   Title Patient will transfer into/ out of shower with mod assist, and min assist for seated control during shower    Time 12    Period Weeks    Status New                   Plan - 04/04/21 1414     Clinical Impression Statement Pt returns from evaluaion for first therapy session. Pt demonstrating good sitting balance and trunk control on edge of mat with and without UE support.    OT Occupational Profile and History Comprehensive Assessment- Review of records and extensive additional review  of physical, cognitive, psychosocial history related to current functional performance    Occupational performance deficits (Please refer to evaluation for details): ADL's;IADL's;Rest and Sleep;Leisure    Body Structure / Function / Physical Skills ADL;Decreased knowledge of use of  DME;Strength;Balance;Dexterity;GMC;Pain;Tone;Body mechanics;Proprioception;UE functional use;Cardiopulmonary status limiting activity;Endurance;IADL;ROM;Fascial restriction;Improper spinal/pelvic alignment;Coordination;Flexibility;Mobility;Sensation;Wound;FMC;Muscle spasms;Skin integrity    Clinical Decision Making Multiple treatment options, significant modification of task necessary    Comorbidities Affecting Occupational Performance: Presence of comorbidities impacting occupational performance    Comorbidities impacting occupational performance description: AIDP, Chronic back pain, Descending thoracic aortic anneurysm    Modification or Assistance to Complete Evaluation  Min-Moderate modification of tasks or assist with assess necessary to complete eval    OT Frequency 3x / week    OT Duration 12 weeks    OT Treatment/Interventions Self-care/ADL training;Moist Heat;Fluidtherapy;DME and/or AE instruction;Splinting;Balance training;Aquatic Therapy;Contrast Bath;Therapeutic activities;Ultrasound;Therapeutic exercise;Passive range of motion;Functional Mobility Training;Neuromuscular education;Electrical Stimulation;Manual Therapy;Patient/family education;Energy conservation    Plan Need to consider splint options for BUE - has good range wrists/MCP - PIP tight.  Would love to see him out of the chair to address sitting balance - hoyer lift vs slide board due to sacral wound.  Review prior UE stretches/ exercise programs    Consulted and Agree with Plan of Care Patient;Family member/caregiver    Family Member Consulted wife [name]             Patient will benefit from skilled therapeutic intervention in order to improve the following deficits and impairments:   Body Structure / Function / Physical Skills: ADL, Decreased knowledge of use of DME, Strength, Balance, Dexterity, GMC, Pain, Tone, Body mechanics, Proprioception, UE functional use, Cardiopulmonary status limiting activity, Endurance, IADL,  ROM, Fascial restriction, Improper spinal/pelvic alignment, Coordination, Flexibility, Mobility, Sensation, Wound, FMC, Muscle spasms, Skin integrity       Visit Diagnosis: Stiffness of right hand, not elsewhere classified  Muscle weakness (generalized)  Other lack of coordination  Abnormal posture  Stiffness of left hand, not elsewhere classified  Unsteadiness on feet  Other disturbances of skin sensation    Problem List Patient Active Problem List   Diagnosis Date Noted   Acute on chronic respiratory failure with hypoxia (HCC)    Acute inflammatory demyelinating polyneuropathy (HCC)    Dysautonomia (HCC)    Atrial fibrillation with RVR (HCC)    Healthcare associated bacterial pneumonia     Junious Dresser, OT/L 04/04/2021, 2:26 PM  [facility] [address] [place], [place], [address] Phone: [phone]   Fax:  [phone]  Name: Brian Espinoza MRN: [id number] Date of Birth: 07-04-1955

## 2021-04-06 ENCOUNTER — Ambulatory Visit: Payer: Medicare Other | Admitting: Physical Therapy

## 2021-04-06 ENCOUNTER — Ambulatory Visit: Payer: Medicare Other | Admitting: Occupational Therapy

## 2021-04-06 ENCOUNTER — Other Ambulatory Visit: Payer: Self-pay

## 2021-04-06 DIAGNOSIS — M6281 Muscle weakness (generalized): Secondary | ICD-10-CM | POA: Diagnosis not present

## 2021-04-06 DIAGNOSIS — R278 Other lack of coordination: Secondary | ICD-10-CM

## 2021-04-06 DIAGNOSIS — R208 Other disturbances of skin sensation: Secondary | ICD-10-CM

## 2021-04-06 DIAGNOSIS — R2689 Other abnormalities of gait and mobility: Secondary | ICD-10-CM

## 2021-04-06 DIAGNOSIS — R2681 Unsteadiness on feet: Secondary | ICD-10-CM

## 2021-04-06 DIAGNOSIS — M25641 Stiffness of right hand, not elsewhere classified: Secondary | ICD-10-CM

## 2021-04-06 DIAGNOSIS — R295 Transient paralysis: Secondary | ICD-10-CM

## 2021-04-06 DIAGNOSIS — R293 Abnormal posture: Secondary | ICD-10-CM

## 2021-04-08 NOTE — Therapy (Signed)
[facility] [address] [place], [place], [address] Phone: [phone]   Fax:  [phone]  Physical Therapy Treatment  Patient Details  Name: Brian Espinoza MRN: [id number] Date of Birth: [birth year] Referring Provider (PT): Alison Murray, MD   Encounter Date: 04/06/2021   PT End of Session - 04/08/21 1741     Visit Number 4    Number of Visits 25    Date for PT Re-Evaluation 05/18/21    Authorization Type Medicare A&B; BCBS - 10th visit PN    Progress Note Due on Visit 10    PT Start Time 1445    PT Stop Time 1530    PT Time Calculation (min) 45 min    Equipment Utilized During Treatment Other (comment)   Steady, Hoyer lift   Activity Tolerance Patient tolerated treatment well    Behavior During Therapy WFL for tasks assessed/performed             Past Medical History:  Diagnosis Date   Acute inflammatory demyelinating polyneuropathy (HCC)    Acute on chronic respiratory failure with hypoxia (HCC)    Atrial fibrillation with RVR (HCC)    Dysautonomia (HCC)    Healthcare associated bacterial pneumonia     No past surgical history on file.  There were no vitals filed for this visit.    04/06/21 1500  Symptoms/Limitations  Subjective Bottom is hurting a lot, asking to tilt back.  Felt good after last session.  Nothing new to report.  Has OT after PT today  Patient is accompained by: Family member  Pertinent History Chest pain, dissection of descending thoracic aorta, aneurysm of artery, a-fib, HTN, systolic CHF, oral phase dysphagia, sacral wound, PNA  Patient Stated Goals Improve hand function and walk to be more independent with daily activities.  Gain strength in legs to be able to lift hips and reduce shear on sacrum.  Pain Assessment  Currently in Pain? Yes  Pain Onset More than a month ago    Tilted patient's wheelchair to provide pressure relief while setting up treatment area.  Performed  forward scooting in wheelchair with verbal cues for lateral leans to off weight hips and to decrease friction and shear.  Pt set up in [place] with grip assist added to front bar to prevent hands from slipping.  Also set up mirror for visual feedback of LE placement and to facilitate more upright posture when performing sit > stand from w/c.  Performed multiple attempts to stand from wheelchair with +2 A but pt unable to maintain grip or positioning of LLE.  Required +2 total A to prevent LLE ER/ABD during sit > stand, assistance to maintain grip on Stedy and to initiate sit >stand.  Once seated on Stedy performed total A transfer to in front of mat.  Even from elevated seat and multiple attempts with +2 total A, pt unable to initiate sit > stand from [place].  Placed patient's UE around each therapist and performed sit > stand with increased assistance from PTs.  Returned to sitting and then supine on mat with +2 total A.    Once in supine performed the following LE exercises for ROM and strengthening: active assisted hip and knee flexion x 8 reps each LE and then added in clock-wise and counter-clockwise circles of R and L hip joint to increase functional ROM.  Performed prolonged hip IR/ADD stretch in supine to bilat LE.  Had pt perform R and LLE single leg presses (hip  and knee extension) with foot on therapist's shoulders and therapist providing manual resistance x 10 reps each LE.  Performed mini bridges with pillow squeeze x 5 reps; therapist cued pt to count out loud to 5 while holding bridge to promote exhalation and to prevent valsalva and increased pressure in chest cavity.   Pt performed rolling with supervision-min A to place hoyer lift.  Performed hoyer back to tilted w/c with +2 assistance.  Pt handed off to OT.  BP at end of session 107/63, HR: 57 as recorded by OT.     PT Short Term Goals - 03/19/21 2153       PT SHORT TERM GOAL #1   Title Pt and wife will demonstrate ability to perform  initial supine and seated HEP    Time 4    Period Weeks    Status New    Target Date 04/18/21      PT SHORT TERM GOAL #2   Title Pt will demonstrate ability to perform rolling and supine <> sit with moderate assistance    Time 4    Period Weeks    Status New    Target Date 04/18/21      PT SHORT TERM GOAL #3   Title Pt will demonstrate ability to perform slideboard on level surfaces w/c <> mat with 50% assistance from PT    Baseline total A in hoyer    Time 4    Period Weeks    Status New    Target Date 04/18/21      PT SHORT TERM GOAL #4   Title Pt will tolerate 5 minutes sustained, supported standing (standing frame or SARA) while performing weight shifting, LE activation training, etc    Time 4    Period Weeks    Status New    Target Date 04/18/21               PT Long Term Goals - 03/19/21 2200       PT LONG TERM GOAL #1   Title Pt and wife will demonstrate independence with progressed HEP    Time 8    Period Weeks    Status New    Target Date 05/18/21      PT LONG TERM GOAL #2   Title Pt will demonstrate ability to perform rolling and supine <> sit with minimal assistance    Time 8    Period Weeks    Status New    Target Date 05/18/21      PT LONG TERM GOAL #3   Title Pt will perform slideboard transfers w/c <> mat on level surface Min A and will begin to demonstrate ability to perform squat-scooting across board    Time 8    Period Weeks    Status New    Target Date 05/18/21      PT LONG TERM GOAL #4   Title Pt will demonstrate ability to perform sit > stand from elevated surface with +2 assistance and UE support    Time 8    Period Weeks    Status New    Target Date 05/18/21               Plan - 04/08/21 1742     Clinical Impression Statement Attempted to utilize Steady to perform sit > stand transfers with UE support but continue to provide total A for transfers.  Due to decreased grip strength and LE weakness, pt required +2 assist  to  stand from wheelchair using Stedy.  Even when seated on elevated seat pt continued to require +2 assistance to transition to standing to sit on mat.  Following supine LE strengthening exercises, utilized hoyer to return to wheelchair.   Pt handed off to OT.    Personal Factors and Comorbidities Comorbidity 3+;Past/Current Experience;Time since onset of injury/illness/exacerbation    Comorbidities Chest pain, dissection of descending thoracic aorta, aneurysm of artery, a-fib, HTN, systolic CHF, oral phase dysphagia, sacral wound, PNA    Examination-Activity Limitations Bed Mobility;Bend;Locomotion Level;Sit;Stairs;Stand;Transfers;Lift;Hygiene/Grooming;Dressing;Toileting;Self Feeding;Reach Overhead;Bathing    Examination-Participation Restrictions Community Activity;Driving;Yard Work    Conservation officer, historic buildings Evolving/Moderate complexity    Rehab Potential Good    PT Frequency 2x / week    PT Duration 8 weeks    PT Treatment/Interventions ADLs/Self Care Home Management;DME Instruction;Gait training;Stair training;Functional mobility training;Therapeutic activities;Therapeutic exercise;Balance training;Neuromuscular re-education;Wheelchair mobility training;Orthotic Fit/Training;Patient/family education;Energy conservation    PT Next Visit Plan MONITOR BP: Keep SBP between 120-130!   Hoyer w/c <> mat; initiate supine/seated HEP to work on LE strengthening.  Sit > stand training if +2 - SARA?  Slideboard training as appropriate.  SCI Fit in wheelchair with LE only.    Consulted and Agree with Plan of Care Patient             Patient will benefit from skilled therapeutic intervention in order to improve the following deficits and impairments:  Cardiopulmonary status limiting activity, Decreased activity tolerance, Decreased balance, Decreased coordination, Decreased endurance, Decreased mobility, Decreased strength, Difficulty walking, Impaired sensation, Impaired tone, Impaired UE  functional use, Postural dysfunction, Pain, Abnormal gait  Visit Diagnosis: Other abnormalities of gait and mobility  Muscle weakness (generalized)  Unsteadiness on feet  Other disturbances of skin sensation  Transient paralysis     Problem List Patient Active Problem List   Diagnosis Date Noted   Acute on chronic respiratory failure with hypoxia (HCC)    Acute inflammatory demyelinating polyneuropathy (HCC)    Dysautonomia (HCC)    Atrial fibrillation with RVR (HCC)    Healthcare associated bacterial pneumonia     Dierdre Highman, PT, DPT 04/08/21    6:13 PM    [facility] [address] [place], [place], [address] Phone: [phone]   Fax:  [phone]  Name: Brian Espinoza MRN: [id number] Date of Birth: 07-03-1955

## 2021-04-09 ENCOUNTER — Other Ambulatory Visit: Payer: Self-pay

## 2021-04-09 ENCOUNTER — Ambulatory Visit: Payer: Medicare Other | Admitting: Occupational Therapy

## 2021-04-09 DIAGNOSIS — M25641 Stiffness of right hand, not elsewhere classified: Secondary | ICD-10-CM

## 2021-04-09 DIAGNOSIS — M25642 Stiffness of left hand, not elsewhere classified: Secondary | ICD-10-CM

## 2021-04-09 DIAGNOSIS — M6281 Muscle weakness (generalized): Secondary | ICD-10-CM | POA: Diagnosis not present

## 2021-04-09 DIAGNOSIS — R2689 Other abnormalities of gait and mobility: Secondary | ICD-10-CM

## 2021-04-09 DIAGNOSIS — R208 Other disturbances of skin sensation: Secondary | ICD-10-CM

## 2021-04-09 DIAGNOSIS — R2681 Unsteadiness on feet: Secondary | ICD-10-CM

## 2021-04-09 DIAGNOSIS — R278 Other lack of coordination: Secondary | ICD-10-CM

## 2021-04-09 NOTE — Therapy (Signed)
[facility] [address] [place], [place], [address] Phone: [phone]   Fax:  [phone]  Occupational Therapy Treatment  Patient Details  Name: Brian Espinoza MRN: [id number] Date of Birth: March 25, 1956 Referring Provider (OT): Shirlee Latch (Will send certification to PCP Venia Minks [facility])   Encounter Date: 04/09/2021   OT End of Session - 04/09/21 1249     Visit Number 4    Number of Visits 37    Date for OT Re-Evaluation 06/20/21    Authorization Type Medicare and Federal BCBS    Progress Note Due on Visit 10    OT Start Time 1235    OT Stop Time 1315    OT Time Calculation (min) 40 min    Activity Tolerance Patient tolerated treatment well    Behavior During Therapy WFL for tasks assessed/performed             Past Medical History:  Diagnosis Date   Acute inflammatory demyelinating polyneuropathy (HCC)    Acute on chronic respiratory failure with hypoxia (HCC)    Atrial fibrillation with RVR (HCC)    Dysautonomia (HCC)    Healthcare associated bacterial pneumonia     No past surgical history on file.  There were no vitals filed for this visit.   Subjective Assessment - 04/09/21 1239     Subjective  "my butt is hurting today"    Currently in Pain? Yes    Pain Score 5     Pain Location Buttocks    Pain Descriptors / Indicators Numbness    Pain Type Chronic pain    Pain Onset More than a month ago    Pain Frequency Constant                          OT Treatments/Exercises (OP) - 04/09/21 0001       ADLs   ADL Comments adjusted patient in tilt in space wheelchair as patient was sitting with significant posterior anterior pelvic tilt and req'd sliding back in chair for more upright sitting position      Neurological Re-education Exercises   Other Grasp and Release Exercises  grasp and release of 10 blocks (1 inch) with RUE with emphasis on mintaining irist  extension while placing into bowl. Pt would have significant wrist drop when reaching up but was able to tap in to wrist extension and maintain until over bowl for release.      Splinting   Splinting assess need for splints - patient would benefit from possible custom resting hand splints bilaterally for nighttime wear and possible wrist cock ups for functional wear during day? - continue to determine need and most beneficial splint for patient.      Manual Therapy   Manual Therapy Passive ROM    Passive ROM to BUE hands and digits.            Assisted patient outside to wait for transportation at end of session.          OT Short Term Goals - 03/22/21 1828       OT SHORT TERM GOAL #1   Title Patient and caregiver will complete a home exercise program designed to improve strength in proximal UE's - shoulders and elbows    Time 4    Period Weeks    Status New    Target Date 04/21/21      OT SHORT TERM GOAL #2  Title Following set up patient will hold a cup with lid and long straw in midline to allow him to take a sip on his own    Time 4    Period Weeks    Status New      OT SHORT TERM GOAL #3   Title While seated in upright supported position, patient will reach forward to make contact with upright target on table top (chest height) x 5 each arm    Time 4    Period Weeks    Status New      OT SHORT TERM GOAL #4   Title Patient will be able to reach toward face with either right or left hand in preparation to scratch chin/cheek    Time 4    Period Weeks    Status New      OT SHORT TERM GOAL #5   Title Patient will utilize cylindrical grip to hold small water bottle in one hand.    Time 4    Period Weeks    Status New               OT Long Term Goals - 04/04/21 1414       OT LONG TERM GOAL #1   Title Patient will complete an updated stretching program to prevent contracture in digits    Time 12    Period Weeks    Status New      OT LONG TERM  GOAL #2   Title Patient and caregiver will demonstrate awareness of splint wear and care for BUE    Time 12    Period Weeks    Status New      OT LONG TERM GOAL #3   Title Patient will wash his face and upper body and upper legs with min assist    Time 12    Period Weeks    Status New      OT LONG TERM GOAL #4   Title Patient will actively bridge in bed to assist with pericare as needed    Time 12    Period Weeks    Status New      OT LONG TERM GOAL #5   Title Patient will transfer to adapted commode with max assist in prep for toileting    Time 12    Period Weeks    Status New      OT LONG TERM GOAL #6   Title Patient will transfer into/ out of shower with mod assist, and min assist for seated control during shower    Time 12    Period Weeks    Status New                   Plan - 04/09/21 1341     Clinical Impression Statement Pt with improved grasp/release today and receptive to cues. Responded well to PROM to BUE digits. Assess appropriate splint needs next session. Pt would possible benefit from custom resting hand splints, bilaterally, for nighttime wear and other splinting (maybe wrist cock up) for daytime wear and functional assist.    OT Occupational Profile and History Comprehensive Assessment- Review of records and extensive additional review of physical, cognitive, psychosocial history related to current functional performance    Occupational performance deficits (Please refer to evaluation for details): ADL's;IADL's;Rest and Sleep;Leisure    Body Structure / Function / Physical Skills ADL;Decreased knowledge of use of DME;Strength;Balance;Dexterity;GMC;Pain;Tone;Body mechanics;Proprioception;UE functional use;Cardiopulmonary status limiting activity;Endurance;IADL;ROM;Fascial restriction;Improper spinal/pelvic alignment;Coordination;Flexibility;Mobility;Sensation;Wound;FMC;Muscle spasms;Skin  integrity    Clinical Decision Making Multiple treatment options,  significant modification of task necessary    Comorbidities Affecting Occupational Performance: Presence of comorbidities impacting occupational performance    Comorbidities impacting occupational performance description: AIDP, Chronic back pain, Descending thoracic aortic anneurysm    Modification or Assistance to Complete Evaluation  Min-Moderate modification of tasks or assist with assess necessary to complete eval    OT Frequency 3x / week    OT Duration 12 weeks    OT Treatment/Interventions Self-care/ADL training;Moist Heat;Fluidtherapy;DME and/or AE instruction;Splinting;Balance training;Aquatic Therapy;Contrast Bath;Therapeutic activities;Ultrasound;Therapeutic exercise;Passive range of motion;Functional Mobility Training;Neuromuscular education;Electrical Stimulation;Manual Therapy;Patient/family education;Energy conservation    Plan Need to consider splint options for BUE - has good range wrists/MCP - PIP tight.  Would love to see him out of the chair to address sitting balance - hoyer lift vs slide board due to sacral wound.  Review prior UE stretches/ exercise programs    Consulted and Agree with Plan of Care Patient;Family member/caregiver    Family Member Consulted wife [name]             Patient will benefit from skilled therapeutic intervention in order to improve the following deficits and impairments:   Body Structure / Function / Physical Skills: ADL, Decreased knowledge of use of DME, Strength, Balance, Dexterity, GMC, Pain, Tone, Body mechanics, Proprioception, UE functional use, Cardiopulmonary status limiting activity, Endurance, IADL, ROM, Fascial restriction, Improper spinal/pelvic alignment, Coordination, Flexibility, Mobility, Sensation, Wound, FMC, Muscle spasms, Skin integrity       Visit Diagnosis: Other abnormalities of gait and mobility  Muscle weakness (generalized)  Unsteadiness on feet  Other disturbances of skin sensation  Stiffness of right hand, not  elsewhere classified  Other lack of coordination  Stiffness of left hand, not elsewhere classified    Problem List Patient Active Problem List   Diagnosis Date Noted   Acute on chronic respiratory failure with hypoxia (HCC)    Acute inflammatory demyelinating polyneuropathy (HCC)    Dysautonomia (HCC)    Atrial fibrillation with RVR (HCC)    Healthcare associated bacterial pneumonia     Junious Dresser, OT/L 04/09/2021, 1:42 PM  [facility] [address] [place], [place], [address] Phone: [phone]   Fax:  [phone]  Name: Ramello Cordial MRN: [id number] Date of Birth: 1956-05-05

## 2021-04-09 NOTE — Therapy (Signed)
[facility] [address] [place], [place], [address] Phone: [phone]   Fax:  [phone]  Occupational Therapy Treatment  Patient Details  Name: Brian Espinoza MRN: [id number] Date of Birth: [birth year] Referring Provider (OT): Shirlee Latch (Will send certification to PCP Venia Minks [facility])   Encounter Date: 04/06/2021   OT End of Session - 04/09/21 0950     Visit Number 3    Number of Visits 37    Date for OT Re-Evaluation 06/20/21    Authorization Type Medicare and Federal BCBS    Progress Note Due on Visit 10    OT Start Time 1536    OT Stop Time 1616    OT Time Calculation (min) 40 min    Activity Tolerance Patient tolerated treatment well    Behavior During Therapy WFL for tasks assessed/performed             Past Medical History:  Diagnosis Date   Acute inflammatory demyelinating polyneuropathy (HCC)    Acute on chronic respiratory failure with hypoxia (HCC)    Atrial fibrillation with RVR (HCC)    Dysautonomia (HCC)    Healthcare associated bacterial pneumonia     No past surgical history on file.  Vitals:   04/06/21 1538  BP: (P) 107/63  Pulse: (!) (P) 57     Subjective Assessment - 04/09/21 0950     Subjective  "just butt pain"    Currently in Pain? Yes    Pain Score 1     Pain Location Buttocks    Pain Descriptors / Indicators Numbness    Pain Type Chronic pain   wound pain/pressure   Pain Onset More than a month ago    Pain Frequency Constant    Aggravating Factors  sitting/pressure             ADL/Adapted Strategies pt reports some exposure to Universal cuff (has one at home and used in rehab) but has not mastered skill and does not use often. At this time, patient is total assist for feeding. Worked on simulated feeding with beans and spoon with spills frequently. Pt trialed Ucuff with wrist support but sample was too small and pulling open therefore not  supporting wrist appropriately. Pt is able to reach to top of head against gravity but coordination and weakness impede overall independence with ADLs. Pt reports concern with long term expectations, fall risks with showering, transfers, etc and spouse not being able to get him back up. OT had long discussion with patient about taking it in chunks of short term goals and continuing to build on skills. Pt was very discouraged today with lack of progress and where he is a year post diagnosis and beginning of decline. OT used therapeutic use of self and reassured patient of progress he has already made and continues to make.   Pt was assisted to transportation and handed off to [employer] at end of session.                        OT Short Term Goals - 03/22/21 1828       OT SHORT TERM GOAL #1   Title Patient and caregiver will complete a home exercise program designed to improve strength in proximal UE's - shoulders and elbows    Time 4    Period Weeks    Status New    Target Date 04/21/21  OT SHORT TERM GOAL #2   Title Following set up patient will hold a cup with lid and long straw in midline to allow him to take a sip on his own    Time 4    Period Weeks    Status New      OT SHORT TERM GOAL #3   Title While seated in upright supported position, patient will reach forward to make contact with upright target on table top (chest height) x 5 each arm    Time 4    Period Weeks    Status New      OT SHORT TERM GOAL #4   Title Patient will be able to reach toward face with either right or left hand in preparation to scratch chin/cheek    Time 4    Period Weeks    Status New      OT SHORT TERM GOAL #5   Title Patient will utilize cylindrical grip to hold small water bottle in one hand.    Time 4    Period Weeks    Status New               OT Long Term Goals - 04/04/21 1414       OT LONG TERM GOAL #1   Title Patient will complete an updated  stretching program to prevent contracture in digits    Time 12    Period Weeks    Status New      OT LONG TERM GOAL #2   Title Patient and caregiver will demonstrate awareness of splint wear and care for BUE    Time 12    Period Weeks    Status New      OT LONG TERM GOAL #3   Title Patient will wash his face and upper body and upper legs with min assist    Time 12    Period Weeks    Status New      OT LONG TERM GOAL #4   Title Patient will actively bridge in bed to assist with pericare as needed    Time 12    Period Weeks    Status New      OT LONG TERM GOAL #5   Title Patient will transfer to adapted commode with max assist in prep for toileting    Time 12    Period Weeks    Status New      OT LONG TERM GOAL #6   Title Patient will transfer into/ out of shower with mod assist, and min assist for seated control during shower    Time 12    Period Weeks    Status New                   Plan - 04/09/21 3299     Clinical Impression Statement Pt discouraged today with current state and level of dependence. Continue to work towards goals and increasing BUE functional use and trunk balance/support for increasing independence.    OT Occupational Profile and History Comprehensive Assessment- Review of records and extensive additional review of physical, cognitive, psychosocial history related to current functional performance    Occupational performance deficits (Please refer to evaluation for details): ADL's;IADL's;Rest and Sleep;Leisure    Body Structure / Function / Physical Skills ADL;Decreased knowledge of use of DME;Strength;Balance;Dexterity;GMC;Pain;Tone;Body mechanics;Proprioception;UE functional use;Cardiopulmonary status limiting activity;Endurance;IADL;ROM;Fascial restriction;Improper spinal/pelvic alignment;Coordination;Flexibility;Mobility;Sensation;Wound;FMC;Muscle spasms;Skin integrity    Clinical Decision Making Multiple treatment options, significant  modification of task  necessary    Comorbidities Affecting Occupational Performance: Presence of comorbidities impacting occupational performance    Comorbidities impacting occupational performance description: AIDP, Chronic back pain, Descending thoracic aortic anneurysm    Modification or Assistance to Complete Evaluation  Min-Moderate modification of tasks or assist with assess necessary to complete eval    OT Frequency 3x / week    OT Duration 12 weeks    OT Treatment/Interventions Self-care/ADL training;Moist Heat;Fluidtherapy;DME and/or AE instruction;Splinting;Balance training;Aquatic Therapy;Contrast Bath;Therapeutic activities;Ultrasound;Therapeutic exercise;Passive range of motion;Functional Mobility Training;Neuromuscular education;Electrical Stimulation;Manual Therapy;Patient/family education;Energy conservation    Plan Need to consider splint options for BUE - has good range wrists/MCP - PIP tight.  Would love to see him out of the chair to address sitting balance - hoyer lift vs slide board due to sacral wound.  Review prior UE stretches/ exercise programs    Consulted and Agree with Plan of Care Patient;Family member/caregiver    Family Member Consulted wife [name]             Patient will benefit from skilled therapeutic intervention in order to improve the following deficits and impairments:   Body Structure / Function / Physical Skills: ADL, Decreased knowledge of use of DME, Strength, Balance, Dexterity, GMC, Pain, Tone, Body mechanics, Proprioception, UE functional use, Cardiopulmonary status limiting activity, Endurance, IADL, ROM, Fascial restriction, Improper spinal/pelvic alignment, Coordination, Flexibility, Mobility, Sensation, Wound, FMC, Muscle spasms, Skin integrity       Visit Diagnosis: Other abnormalities of gait and mobility  Muscle weakness (generalized)  Stiffness of right hand, not elsewhere classified  Other lack of coordination  Unsteadiness on  feet  Abnormal posture  Other disturbances of skin sensation    Problem List Patient Active Problem List   Diagnosis Date Noted   Acute on chronic respiratory failure with hypoxia (HCC)    Acute inflammatory demyelinating polyneuropathy (HCC)    Dysautonomia (HCC)    Atrial fibrillation with RVR (HCC)    Healthcare associated bacterial pneumonia     Junious Dresser, OT/L 04/09/2021, 9:59 AM  [facility] [address] [place], [place], [address] Phone: [phone]   Fax:  [phone]  Name: Brian Espinoza MRN: [id number] Date of Birth: 08-Dec-1955

## 2021-04-11 ENCOUNTER — Other Ambulatory Visit: Payer: Self-pay

## 2021-04-11 ENCOUNTER — Encounter: Payer: Medicare Other | Admitting: Speech Pathology

## 2021-04-11 ENCOUNTER — Ambulatory Visit: Payer: Medicare Other | Admitting: Occupational Therapy

## 2021-04-11 ENCOUNTER — Ambulatory Visit: Payer: Medicare Other

## 2021-04-11 VITALS — BP 128/72

## 2021-04-11 DIAGNOSIS — R293 Abnormal posture: Secondary | ICD-10-CM

## 2021-04-11 DIAGNOSIS — M6281 Muscle weakness (generalized): Secondary | ICD-10-CM | POA: Diagnosis not present

## 2021-04-11 DIAGNOSIS — R2681 Unsteadiness on feet: Secondary | ICD-10-CM

## 2021-04-11 NOTE — Therapy (Addendum)
[facility] [address] [place], [place], [address] Phone: [phone]   Fax:  [phone]  Physical Therapy Treatment  Patient Details  Name: Brian Espinoza MRN: [id number] Date of Birth: [birth year] Referring Provider (PT): Alison Murray, MD   Encounter Date: 04/11/2021   PT End of Session - 04/11/21 1353     Visit Number 5    Number of Visits 25    Date for PT Re-Evaluation 05/18/21    Authorization Type Medicare A&B; BCBS - 10th visit PN    Progress Note Due on Visit 10    PT Start Time 1353    PT Stop Time 1445    PT Time Calculation (min) 52 min    Equipment Utilized During Treatment Other (comment)   [place]man Nip, Hoyer lift   Activity Tolerance Patient limited by pain;Patient limited by fatigue    Behavior During Therapy [facility] for tasks assessed/performed             Past Medical History:  Diagnosis Date   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 No pertinent surgical history.  Vitals:   04/11/21 1354 04/11/21 1522  BP: (!) 148/72 128/72     Subjective Assessment - 04/11/21 1354     Subjective Pt reports that his bottom is really bothering him.    Patient is accompained by: Family member    Pertinent History Chest pain, dissection of descending thoracic aorta, aneurysm of artery, a-fib, HTN, systolic CHF, oral phase dysphagia, sacral wound, PNA    Patient Stated Goals Improve hand function and walk to be more independent with daily activities.  Gain strength in legs to be able to lift hips and reduce shear on sacrum.    Currently in Pain? Yes    Pain Score 6     Pain Location Buttocks    Pain Descriptors / Indicators Numbness   and painful   Pain Type Chronic pain    Pain Onset More than a month ago                                [facility] Adult PT Treatment/Exercise - 04/11/21 1354       Bed Mobility   Bed Mobility Rolling Right;Rolling Left;Right Sidelying to Sit    Rolling Right Set up assist;Supervision/verbal cueing    Rolling Left Minimal Assistance - Patient > 75%    Right Sidelying to Sit Maximal Assistance - Patient 25-49%   Pt able to get legs off but then needed max assist to come up from sidelying with difficulty pushing with right elbow and left hand.     Transfers   Transfers Stand to Sit    Sit to Stand 1: +2 Total assist   with use of Sara Plus x 2   Sit to Stand Details (indicate cue type and reason) PT had to block legs from abducting and ER while coming up in [place] lift to stand. Cued to try to depress through shoulders on arm rests of Huntley Dec to stay up tall when rising. PT student blocked left arm to keep on rest.    Stand to Sit 1: +2 Total assist   with Huntley Dec Plus lift.   Stand to Sit Details  Pt's knees buckle quickly and PT blocked to be sure they stayed within leg guards as pt reporting some pain with first descent from presure on right shin. Pt reported less discomfort the second time but legs still buckle quickly startling pt.    Comments Standing in [place] lift with PT trying to keep slight bend in knees as pt locks out as soon as he stands. Pt was cued to squeeze his gluts and had him perform some quad sets with knees bent and then trying to straighten up x 5. Pt stook about 30-45 sec for 2 bouts in [place]. Did report some discomfort in biceps with prolong standing. To scoot back on mat in sitting pt needed total assist. Utilized tilt on his w/c to get back in chair all the way after using Michiel Sites to get to/from mat total assist of +2.      Neuro Re-ed    Neuro Re-ed Details  Sitting edge of mat with feet on floor: Pt initially leaning posterior and to right min assist to maintain. Had him work on trying to correct instructing to lean forward. Pt reporting  he always seems to lean to the right and right hip bothers him. Had pt try leaning forward on large blue physioball which helped bottom pain but not right hip. Pt performed reaching across body for targets x 5 each side with min assist when reaching across to the right with left arm. Towards end pt was able to maintain upright with CGA only. Once Huntley Dec in front had pt lean forward on Sara and he reported that helped with bottom and right hip pain.      Exercises   Exercises Other Exercises    Other Exercises  Seated edge of mat trying to step foot forward and then back picking up with PT blocking leg to prevent ER and adducting foot in x 3 each side. Seated hip adduction x 10 then controlling with coming back out.                       PT Short Term Goals - 03/19/21 2153       PT SHORT TERM GOAL #1   Title Pt and wife will demonstrate ability to perform initial supine and seated HEP    Time 4    Period Weeks    Status New    Target Date 04/18/21      PT SHORT TERM GOAL #2   Title Pt will demonstrate ability to perform rolling and supine <> sit with moderate assistance    Time 4    Period Weeks    Status New    Target Date 04/18/21      PT SHORT TERM GOAL #3   Title Pt will demonstrate ability to perform slideboard on level surfaces w/c <> mat with 50% assistance from PT    Baseline total A in hoyer    Time 4    Period Weeks    Status New    Target Date 04/18/21      PT SHORT TERM GOAL #4   Title Pt will tolerate 5 minutes sustained, supported standing (standing frame or SARA) while performing weight shifting, LE activation training, etc    Time 4    Period Weeks    Status New    Target Date 04/18/21               PT Long Term Goals - 03/19/21 2200  PT LONG TERM GOAL #1   Title Pt and wife will demonstrate independence with progressed HEP    Time 8    Period Weeks    Status New    Target Date 05/18/21      PT LONG TERM GOAL #2   Title Pt will  demonstrate ability to perform rolling and supine <> sit with minimal assistance    Time 8    Period Weeks    Status New    Target Date 05/18/21      PT LONG TERM GOAL #3   Title Pt will perform slideboard transfers w/c <> mat on level surface Min A and will begin to demonstrate ability to perform squat-scooting across board    Time 8    Period Weeks    Status New    Target Date 05/18/21      PT LONG TERM GOAL #4   Title Pt will demonstrate ability to perform sit > stand from elevated surface with +2 assistance and UE support    Time 8    Period Weeks    Status New    Target Date 05/18/21                   Plan - 04/11/21 1519     Clinical Impression Statement PT trialed standing in [place] Plus lift today with total assist transfer sit to/from stand. Pt able to maintain more upright posture once in standing but does lock out knees with no eccentric control with lowering with knees buckling quickly in to the lift leg guard. PT had to assist to keep legs in proper position as they externally rotate and abduct. BP was a little higher when first arrived but improved after rested a few minutes to safe range to participate. Pt is limited by pain in bottom and right hip.    Personal Factors and Comorbidities Comorbidity 3+;Past/Current Experience;Time since onset of injury/illness/exacerbation    Comorbidities Chest pain, dissection of descending thoracic aorta, aneurysm of artery, a-fib, HTN, systolic CHF, oral phase dysphagia, sacral wound, PNA    Examination-Activity Limitations Bed Mobility;Bend;Locomotion Level;Sit;Stairs;Stand;Transfers;Lift;Hygiene/Grooming;Dressing;Toileting;Self Feeding;Reach Overhead;Bathing    Examination-Participation Restrictions Community Activity;Driving;Yard Work    Conservation officer, historic buildings Evolving/Moderate complexity    Rehab Potential Good    PT Frequency 2x / week    PT Duration 8 weeks    PT Treatment/Interventions ADLs/Self Care Home  Management;DME Instruction;Gait training;Stair training;Functional mobility training;Therapeutic activities;Therapeutic exercise;Balance training;Neuromuscular re-education;Wheelchair mobility training;Orthotic Fit/Training;Patient/family education;Energy conservation    PT Next Visit Plan MONITOR BP: Keep SBP between 120-130!   Hoyer w/c <> mat; initiate supine/seated HEP to work on LE strengthening.  Sit > stand training if +2 with [place] Dec or with +2 assist from elevated mat.  Slideboard training as appropriate.  SCI Fit in wheelchair with LE only.    Consulted and Agree with Plan of Care Patient             Patient will benefit from skilled therapeutic intervention in order to improve the following deficits and impairments:  Cardiopulmonary status limiting activity, Decreased activity tolerance, Decreased balance, Decreased coordination, Decreased endurance, Decreased mobility, Decreased strength, Difficulty walking, Impaired sensation, Impaired tone, Impaired UE functional use, Postural dysfunction, Pain, Abnormal gait  Visit Diagnosis: Muscle weakness (generalized)  Abnormal posture  Unsteadiness on feet     Problem List Patient Active Problem List   Diagnosis Date Noted   Acute on chronic respiratory failure with hypoxia (HCC)    Acute inflammatory demyelinating polyneuropathy (  HCC)    Dysautonomia (HCC)    Atrial fibrillation with RVR (HCC)    Healthcare associated bacterial pneumonia     Brian Espinoza, PT, DPT, NCS 04/11/2021, 4:09 PM  [facility] [address] [place], [place], [address] Phone: [phone]   Fax:  [phone]  Name: Brian Espinoza MRN: [id number] Date of Birth: [birth year]

## 2021-04-13 ENCOUNTER — Other Ambulatory Visit: Payer: Self-pay

## 2021-04-13 ENCOUNTER — Encounter: Payer: Self-pay | Admitting: Occupational Therapy

## 2021-04-13 ENCOUNTER — Ambulatory Visit: Payer: Medicare Other | Admitting: Occupational Therapy

## 2021-04-13 ENCOUNTER — Ambulatory Visit: Payer: Medicare Other | Admitting: Physical Therapy

## 2021-04-13 DIAGNOSIS — R208 Other disturbances of skin sensation: Secondary | ICD-10-CM

## 2021-04-13 DIAGNOSIS — M6281 Muscle weakness (generalized): Secondary | ICD-10-CM

## 2021-04-13 DIAGNOSIS — M25642 Stiffness of left hand, not elsewhere classified: Secondary | ICD-10-CM

## 2021-04-13 DIAGNOSIS — R2689 Other abnormalities of gait and mobility: Secondary | ICD-10-CM

## 2021-04-13 DIAGNOSIS — R278 Other lack of coordination: Secondary | ICD-10-CM

## 2021-04-13 DIAGNOSIS — M25641 Stiffness of right hand, not elsewhere classified: Secondary | ICD-10-CM

## 2021-04-13 DIAGNOSIS — R2681 Unsteadiness on feet: Secondary | ICD-10-CM

## 2021-04-13 DIAGNOSIS — R295 Transient paralysis: Secondary | ICD-10-CM

## 2021-04-13 NOTE — Therapy (Signed)
[facility] [address] [place], [place], [address] Phone: [phone]   Fax:  [phone]  Occupational Therapy Treatment  Patient Details  Name: Brian Espinoza MRN: [id number] Date of Birth: Aug 28, 1955 Referring Provider (OT): Shirlee Latch (Will send certification to PCP Venia Minks [facility])   Encounter Date: 04/13/2021   OT End of Session - 04/13/21 1322     Visit Number 5    Number of Visits 37    Date for OT Re-Evaluation 06/20/21    Authorization Type Medicare and Federal BCBS    Progress Note Due on Visit 10    OT Start Time 1318    OT Stop Time 1400    OT Time Calculation (min) 42 min    Activity Tolerance Patient tolerated treatment well    Behavior During Therapy WFL for tasks assessed/performed             Past Medical History:  Diagnosis Date   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 No pertinent surgical history.  There were no vitals filed for this visit.   Subjective Assessment - 04/13/21 1321     Subjective  pt doesn't report any new pain or changes.    Patient is accompanied by: Family member   daughter   Currently in Pain? Yes    Pain Score 3     Pain Location Buttocks    Pain Descriptors / Indicators Numbness    Pain Type Chronic pain    Pain Onset More than a month ago    Pain Frequency Constant    Aggravating Factors  sitting/pressure                          OT Treatments/Exercises (OP) - 04/13/21 0001       Splinting   Splinting fabricated custom resting hand splint for RUE. Pt and daughter educated on gradual splint wear time and checking for redness, indentions or marks and discontinuing wear if any appear and to bring in next time for  adjustments.  Pt and daughter verbalized understanding.                      OT Short Term Goals - 03/22/21 1828       OT SHORT TERM GOAL #1   Title Patient and caregiver will complete a home exercise program designed to improve strength in proximal UE's - shoulders and elbows    Time 4    Period Weeks    Status New    Target Date 04/21/21      OT SHORT TERM GOAL #2   Title Following set up patient will hold a cup with lid and long straw in midline to allow him to take a sip on his own    Time 4    Period Weeks    Status New      OT SHORT TERM GOAL #3   Title While seated in upright supported position, patient will reach forward to make contact with upright target on table top (chest height) x 5 each arm    Time 4    Period Weeks    Status New      OT SHORT TERM GOAL #4  Title Patient will be able to reach toward face with either right or left hand in preparation to scratch chin/cheek    Time 4    Period Weeks    Status New      OT SHORT TERM GOAL #5   Title Patient will utilize cylindrical grip to hold small water bottle in one hand.    Time 4    Period Weeks    Status New               OT Long Term Goals - 04/04/21 1414       OT LONG TERM GOAL #1   Title Patient will complete an updated stretching program to prevent contracture in digits    Time 12    Period Weeks    Status New      OT LONG TERM GOAL #2   Title Patient and caregiver will demonstrate awareness of splint wear and care for BUE    Time 12    Period Weeks    Status New      OT LONG TERM GOAL #3   Title Patient will wash his face and upper body and upper legs with min assist    Time 12    Period Weeks    Status New      OT LONG TERM GOAL #4   Title Patient will actively bridge in bed to assist with pericare as needed    Time 12    Period Weeks    Status New      OT LONG TERM GOAL #5   Title Patient will transfer to adapted commode with max assist in prep for toileting     Time 12    Period Weeks    Status New      OT LONG TERM GOAL #6   Title Patient will transfer into/ out of shower with mod assist, and min assist for seated control during shower    Time 12    Period Weeks    Status New                   Plan - 04/13/21 1418     Clinical Impression Statement abricated resting hand splint for RUE. Pt still needs functional splints, bilaterally, and resting hand splint for LUE.    OT Occupational Profile and History Comprehensive Assessment- Review of records and extensive additional review of physical, cognitive, psychosocial history related to current functional performance    Occupational performance deficits (Please refer to evaluation for details): ADL's;IADL's;Rest and Sleep;Leisure    Body Structure / Function / Physical Skills ADL;Decreased knowledge of use of DME;Strength;Balance;Dexterity;GMC;Pain;Tone;Body mechanics;Proprioception;UE functional use;Cardiopulmonary status limiting activity;Endurance;IADL;ROM;Fascial restriction;Improper spinal/pelvic alignment;Coordination;Flexibility;Mobility;Sensation;Wound;FMC;Muscle spasms;Skin integrity    Clinical Decision Making Multiple treatment options, significant modification of task necessary    Comorbidities Affecting Occupational Performance: Presence of comorbidities impacting occupational performance    Comorbidities impacting occupational performance description: AIDP, Chronic back pain, Descending thoracic aortic anneurysm    Modification or Assistance to Complete Evaluation  Min-Moderate modification of tasks or assist with assess necessary to complete eval    OT Frequency 3x / week    OT Duration 12 weeks    OT Treatment/Interventions Self-care/ADL training;Moist Heat;Fluidtherapy;DME and/or AE instruction;Splinting;Balance training;Aquatic Therapy;Contrast Bath;Therapeutic activities;Ultrasound;Therapeutic exercise;Passive range of motion;Functional Mobility Training;Neuromuscular  education;Electrical Stimulation;Manual Therapy;Patient/family education;Energy conservation    Plan adjustments to splint PRN, make LUE resting hand splint, maybe 2 wrist cock up for functional use.    Consulted and Agree with  Plan of Care Patient;Family member/caregiver    Family Member Consulted wife [name]             Patient will benefit from skilled therapeutic intervention in order to improve the following deficits and impairments:   Body Structure / Function / Physical Skills: ADL, Decreased knowledge of use of DME, Strength, Balance, Dexterity, GMC, Pain, Tone, Body mechanics, Proprioception, UE functional use, Cardiopulmonary status limiting activity, Endurance, IADL, ROM, Fascial restriction, Improper spinal/pelvic alignment, Coordination, Flexibility, Mobility, Sensation, Wound, FMC, Muscle spasms, Skin integrity       Visit Diagnosis: Stiffness of left hand, not elsewhere classified  Other abnormalities of gait and mobility  Stiffness of right hand, not elsewhere classified  Other lack of coordination    Problem List Patient Active Problem List   Diagnosis Date Noted   Acute on chronic respiratory failure with hypoxia (HCC)    Acute inflammatory demyelinating polyneuropathy (HCC)    Dysautonomia (HCC)    Atrial fibrillation with RVR (HCC)    Healthcare associated bacterial pneumonia     Junious Dresser, OT/L 04/13/2021, 2:19 PM  [facility] [address] [place], [place], [address] Phone: [phone]   Fax:  [phone]  Name: Ramell Wacha MRN: [id number] Date of Birth: [birth year]

## 2021-04-15 NOTE — Therapy (Signed)
[facility] [address] [place], [place], [address] Phone: [phone]   Fax:  [phone]  Physical Therapy Treatment  Patient Details  Name: Brian Espinoza MRN: [id number] Date of Birth: 1955-11-26 Referring Provider (PT): Alison Murray, MD   Encounter Date: 04/13/2021   PT End of Session - 04/15/21 1728     Visit Number 6    Number of Visits 25    Date for PT Re-Evaluation 05/18/21    Authorization Type Medicare A&B; [employer] - 10th visit PN    Progress Note Due on Visit 10    PT Start Time 1403    PT Stop Time 1450    PT Time Calculation (min) 47 min    Equipment Utilized During Treatment Other (comment)   [place]man Nip, Hoyer lift   Activity Tolerance Patient limited by fatigue    Behavior During Therapy WFL for tasks assessed/performed             Past Medical History:  Diagnosis Date   Acute inflammatory demyelinating polyneuropathy (HCC)    Acute on chronic respiratory failure with hypoxia (HCC)    Atrial fibrillation with RVR (HCC)    Dysautonomia (HCC)    Healthcare associated bacterial pneumonia     No past surgical history on file.  There were no vitals filed for this visit.    04/13/21 1406  Symptoms/Limitations  Subjective Pt accompanied by his daughter today.  Would be open to trying the [place] Dec again today for standing.  Patient is accompained by: Family member  Pertinent History Chest pain, dissection of descending thoracic aorta, aneurysm of artery, a-fib, HTN, systolic CHF, oral phase dysphagia, sacral wound, PNA  Patient Stated Goals Improve hand function and walk to be more independent with daily activities.  Gain strength in legs to be able to lift hips and reduce shear on sacrum.  Pain Assessment  Currently in Pain? Yes  Pain Onset More than a month ago    Performed transfer from tilt in space w/c > elevated mat with hoyer and +2 assistance.  Pt transferred into sitting on edge of mat.   Performed sitting balance training on mat by performing alternating lateral leans with LE Lifts to assist with placement of Hunt[place] Dec walking sling.  Pt set up in [place] Plus without foot plate but with gait belt around thighs to maintain neutral hip rotation during sit > stand.  Pt demonstrated improved grip of handles.    With mat elevated, performed multiple sit <> stand from mat with feet in different positions and therapist and SPT blocking feet to prevent knee hyperextension pushing pelvis and COG posteriorly.  Able to come to standing after multiple attempts with BOS under COG and fully upright trunk.  Performed prolonged standing and WB through UE and LE practicing lateral weight shifting, attempting to advance each LE forwards or backwards, and performing 5 reps mini squats.  When weight shifting to RLE pt experiences sudden hip and knee flexion and required sitting rest break to reposition LE and feet.  When performing mini squats pt demonstrates uncontrolled hip and knee flexion (lack of eccentric control).  Unable to advance either foot.  During seated rest breaks assessed vitals: Systolic BP rose to 140 during activity but returned to <130 with seated rest break.  Diastolic remained below 80 during full session.   Unable to use hoyer to return to w/c - hydraulic lift broke when pumping to prepare for transfer.  Pt was in harness but not  lifted off mat; no injury to patient.  Removed hoyer and harness and performed total A +2 squat pivot from mat > w/c and multiple lifts back in wheelchair for positioning.      PT Short Term Goals - 03/19/21 2153       PT SHORT TERM GOAL #1   Title Pt and wife will demonstrate ability to perform initial supine and seated HEP    Time 4    Period Weeks    Status New    Target Date 04/18/21      PT SHORT TERM GOAL #2   Title Pt will demonstrate ability to perform rolling and supine <> sit with moderate assistance    Time 4    Period Weeks    Status New     Target Date 04/18/21      PT SHORT TERM GOAL #3   Title Pt will demonstrate ability to perform slideboard on level surfaces w/c <> mat with 50% assistance from PT    Baseline total A in hoyer    Time 4    Period Weeks    Status New    Target Date 04/18/21      PT SHORT TERM GOAL #4   Title Pt will tolerate 5 minutes sustained, supported standing (standing frame or SARA) while performing weight shifting, LE activation training, etc    Time 4    Period Weeks    Status New    Target Date 04/18/21               PT Long Term Goals - 03/19/21 2200       PT LONG TERM GOAL #1   Title Pt and wife will demonstrate independence with progressed HEP    Time 8    Period Weeks    Status New    Target Date 05/18/21      PT LONG TERM GOAL #2   Title Pt will demonstrate ability to perform rolling and supine <> sit with minimal assistance    Time 8    Period Weeks    Status New    Target Date 05/18/21      PT LONG TERM GOAL #3   Title Pt will perform slideboard transfers w/c <> mat on level surface Min A and will begin to demonstrate ability to perform squat-scooting across board    Time 8    Period Weeks    Status New    Target Date 05/18/21      PT LONG TERM GOAL #4   Title Pt will demonstrate ability to perform sit > stand from elevated surface with +2 assistance and UE support    Time 8    Period Weeks    Status New    Target Date 05/18/21                   Plan - 04/15/21 1730     Clinical Impression Statement Returned to use of [place] Dec plus for sit > stand and prolonged standing with addition of gait belt around thighs to maintain neutral hip rotation and use of elevated mat to decrease demand on LE extensors.  Pt better able to grip handles and able to perform more repetitions of sit > stand and maintain standing for longer period of time without increased pressure on knees and without significant increase in BP.  Continues to demonstrate uncontrolled LE flexion  during stand > sit and unable to advance either LE forwards or back.  Required +2 total A to squat pivot back to w/c at end of session due to hoyer lift hydraulic malfunction.    Personal Factors and Comorbidities Comorbidity 3+;Past/Current Experience;Time since onset of injury/illness/exacerbation    Comorbidities Chest pain, dissection of descending thoracic aorta, aneurysm of artery, a-fib, HTN, systolic CHF, oral phase dysphagia, sacral wound, PNA    Examination-Activity Limitations Bed Mobility;Bend;Locomotion Level;Sit;Stairs;Stand;Transfers;Lift;Hygiene/Grooming;Dressing;Toileting;Self Feeding;Reach Overhead;Bathing    Examination-Participation Restrictions Community Activity;Driving;Yard Work    Conservation officer, historic buildings Evolving/Moderate complexity    Rehab Potential Good    PT Frequency 2x / week    PT Duration 8 weeks    PT Treatment/Interventions ADLs/Self Care Home Management;DME Instruction;Gait training;Stair training;Functional mobility training;Therapeutic activities;Therapeutic exercise;Balance training;Neuromuscular re-education;Wheelchair mobility training;Orthotic Fit/Training;Patient/family education;Energy conservation    PT Next Visit Plan Ask what type of wheelchair he will be getting from [employer] (manual tilt is a loaner) is he getting power??  MONITOR BP: Keep SBP between 120-130! SCI Fit in wheelchair with LE only.  Hoyer lift is broken - will have to use slideboard for squat scooting.  Sit <> stand from elevated mat with SARA - gait belt around knees to prevent hip ABD/ER - remove foot plate and knee plate to work on LE advancement?    Consulted and Agree with Plan of Care Patient             Patient will benefit from skilled therapeutic intervention in order to improve the following deficits and impairments:  Cardiopulmonary status limiting activity, Decreased activity tolerance, Decreased balance, Decreased coordination, Decreased endurance, Decreased  mobility, Decreased strength, Difficulty walking, Impaired sensation, Impaired tone, Impaired UE functional use, Postural dysfunction, Pain, Abnormal gait  Visit Diagnosis: Other abnormalities of gait and mobility  Muscle weakness (generalized)  Unsteadiness on feet  Other disturbances of skin sensation  Transient paralysis     Problem List Patient Active Problem List   Diagnosis Date Noted   Acute on chronic respiratory failure with hypoxia (HCC)    Acute inflammatory demyelinating polyneuropathy (HCC)    Dysautonomia (HCC)    Atrial fibrillation with RVR (HCC)    Healthcare associated bacterial pneumonia    Dierdre Highman, PT, DPT 04/15/21    5:46 PM    [place] [address] [place], [place], [address] Phone: [phone]   Fax:  [phone]  Name: Masami Plata MRN: [id number] Date of Birth: 15-Sep-1955

## 2021-04-16 ENCOUNTER — Ambulatory Visit: Payer: Medicare Other | Admitting: Physical Therapy

## 2021-04-16 ENCOUNTER — Ambulatory Visit: Payer: Medicare Other | Admitting: Occupational Therapy

## 2021-04-18 ENCOUNTER — Encounter: Payer: Self-pay | Admitting: Occupational Therapy

## 2021-04-18 ENCOUNTER — Ambulatory Visit: Payer: Medicare Other | Admitting: Occupational Therapy

## 2021-04-18 ENCOUNTER — Encounter: Payer: Medicare Other | Admitting: Speech Pathology

## 2021-04-18 ENCOUNTER — Other Ambulatory Visit: Payer: Self-pay

## 2021-04-18 ENCOUNTER — Ambulatory Visit: Payer: Medicare Other

## 2021-04-18 DIAGNOSIS — R293 Abnormal posture: Secondary | ICD-10-CM

## 2021-04-18 DIAGNOSIS — M25642 Stiffness of left hand, not elsewhere classified: Secondary | ICD-10-CM

## 2021-04-18 DIAGNOSIS — M25641 Stiffness of right hand, not elsewhere classified: Secondary | ICD-10-CM

## 2021-04-18 DIAGNOSIS — R2681 Unsteadiness on feet: Secondary | ICD-10-CM

## 2021-04-18 DIAGNOSIS — R208 Other disturbances of skin sensation: Secondary | ICD-10-CM

## 2021-04-18 DIAGNOSIS — M6281 Muscle weakness (generalized): Secondary | ICD-10-CM

## 2021-04-18 DIAGNOSIS — R2689 Other abnormalities of gait and mobility: Secondary | ICD-10-CM

## 2021-04-18 DIAGNOSIS — R278 Other lack of coordination: Secondary | ICD-10-CM

## 2021-04-18 NOTE — Patient Instructions (Signed)
Access Code: [email or web address] URL: [email or web address] Date: 04/18/2021 Prepared by: Elmer Bales  Exercises Supine Bridge - 1 x daily - 5 x weekly - 2 sets - 10 reps Hip flexion/extension with resistance - 1 x daily - 5 x weekly - 2 sets - 10 reps Supine Hip Internal Rotation PROM with Caregiver - 2 x daily - 5 x weekly - 1 sets - 4 reps - 1 min hold Sitting away from back of chair upright. - 2 x daily - 7 x weekly - 1 sets - 3 reps - 3-5 min hold

## 2021-04-18 NOTE — Therapy (Signed)
[place] [address] [place], [place], [address] Phone: [phone]   Fax:  [phone]  Physical Therapy Treatment  Patient Details  Name: Brian Espinoza MRN: [id number] Date of Birth: [birth year] Referring Provider (PT): Leandro Reasoner, MD   Encounter Date: 04/18/2021   PT End of Session - 04/18/21 1707     Visit Number 7    Number of Visits 25    Date for PT Re-Evaluation 05/18/21    Authorization Type Medicare A&B; [place] - 10th visit PN    Progress Note Due on Visit 10    PT Start Time 1530    PT Stop Time 1615    PT Time Calculation (min) 45 min    Equipment Utilized During Treatment Other (comment)   slideboard   Activity Tolerance Patient limited by fatigue    Behavior During Therapy [facility] for tasks assessed/performed             Past Medical History:  Diagnosis Date   Acute inflammatory demyelinating polyneuropathy ([place])    Acute on chronic respiratory failure with hypoxia (HCC)    Atrial fibrillation with RVR ([place])    Dysautonomia ([place])    Healthcare associated bacterial pneumonia     History reviewed. No pertinent surgical history.  There were no vitals filed for this visit.   Subjective Assessment - 04/18/21 1531     Subjective Pt states he's been doing good since the last appointment. Pt denies any changes.    Patient is accompained by: Family member    Pertinent History Chest pain, dissection of descending thoracic aorta, aneurysm of artery, a-fib, HTN, systolic CHF, oral phase dysphagia, sacral wound, PNA    Patient Stated Goals Improve hand function and walk to be more independent with daily activities.  Gain strength in legs to be able to lift hips and reduce shear on sacrum.    Currently in Pain? Yes    Pain Score 3     Pain Location Hip    Pain Orientation Left    Pain Descriptors / Indicators Aching    Pain Type Chronic pain    Pain Onset More than a month ago                                [facility] Adult PT Treatment/Exercise - 04/18/21 1533       Bed Mobility   Bed Mobility Rolling Right;Rolling Left;Right Sidelying to Sit    Rolling Right Supervision/verbal cueing    Rolling Left Supervision/Verbal cueing    Right Sidelying to Sit Minimal Assistance - Patient > 75%      Transfers   Transfers Lateral/Scoot Transfers    Lateral/Scoot Transfers 2: Max assist   Mod assist +1 Min guard from behind   Lateral/Scoot Transfer Details (indicate cue type and reason) PT in front blocking pt's LLE to avoid it kicking out to the left. Pt instructed to lean over PT's right shoulder. PT moved feet and scooted pt to the left by sliding hips. Pt transfered from mat to w/c    Supine to Sit 4: Min assist    Supine to Sit Details (indicate cue type and reason) Cues for reaching towards me to allow shoulder to come up. PT assisted with putting elbow under him and assisting legs off. Min assist required to bring pt up from side lying to sitting.      Neuro Re-ed  Neuro Re-ed Details  Sitting on edge of mat with feet on floor. Pt performed x10 weight shift forward, laterally, each side x10 to facilitate slide board transfer. PT provided min guard and verbal cues to lean weight forward and lift hips.      Exercises   Exercises Other Exercises    Other Exercises  On mat pt performed resisted hip flexion/extension x10 each BLE. PT supported pts leg at ankle and knee to avoid pt's knee falling into extension. PT cued pt to move leg in a controlled manner. Pt perfromed mini bridges on mat x10. PT supported pt/s legs to avoid legs sliding down as pt has tendency to push straight instead of down into the mat. PT provided verbal cues to squeeze glutes and lift. PT performed BLE hip external rotation stretch 4x30 sec each. Pt instructed to tell us if stretch caused pain.                     PT Education - 04/18/21 1711     Education Details PT provided and  educated pt on initial HEP focused on bed mobility and BLE stretching.    Person(s) Educated Patient    Methods Explanation;Handout    Comprehension Verbalized understanding;Returned demonstration              PT Short Term Goals - 04/18/21 1708       PT SHORT TERM GOAL #1   Title Pt and wife will demonstrate ability to perform initial supine and seated HEP    Baseline 04/18/21 HEP provided    Time 4    Period Weeks    Status On-going    Target Date 04/18/21      PT SHORT TERM GOAL #2   Title Pt will demonstrate ability to perform rolling and supine <> sit with moderate assistance    Baseline 04/18/21 Pt required min assist    Time 4    Period Weeks    Status Achieved    Target Date 04/18/21      PT SHORT TERM GOAL #3   Title Pt will demonstrate ability to perform slideboard on level surfaces w/c <> mat with 50% assistance from PT    Baseline total A in hoyer 04/18/21 Pt required 75% assistance    Time 4    Period Weeks    Status Not Met    Target Date 04/18/21      PT SHORT TERM GOAL #4   Title Pt will tolerate 5 minutes sustained, supported standing (standing frame or SARA) while performing weight shifting, LE activation training, etc    Time 4    Period Weeks    Status Not Met    Target Date 04/18/21               PT Long Term Goals - 03/19/21 2200       PT LONG TERM GOAL #1   Title Pt and wife will demonstrate independence with progressed HEP    Time 8    Period Weeks    Status New    Target Date 05/18/21      PT LONG TERM GOAL #2   Title Pt will demonstrate ability to perform rolling and supine <> sit with minimal assistance    Time 8    Period Weeks    Status New    Target Date 05/18/21      PT LONG TERM GOAL #3   Title Pt will perform slideboard transfers  w/c <> mat on level surface Min A and will begin to demonstrate ability to perform squat-scooting across board    Time 8    Period Weeks    Status New    Target Date 05/18/21      PT  LONG TERM GOAL #4   Title Pt will demonstrate ability to perform sit > stand from elevated surface with +2 assistance and UE support    Time 8    Period Weeks    Status New    Target Date 05/18/21                   Plan - 04/18/21 1713     Clinical Impression Statement Today's session focused on checking pt's STG. Pt demonstrated improved bed mobility being able to roll to each side independently and go from supine to sitting w/ min assist to get elbow under him and then guide legs off the edge of table. PT provided and educated pt on intial HEP focused on activities to facilitate bed mobility and BLE stretching. Pt still demonstrates deficits in transfers and functional mobility as he is still a max assist on a slide board transfer and is unable to maintain standing position for 5 minutes. PT will continue to progress pt as tolerated w/ activities towards achieving LTG.    Personal Factors and Comorbidities Comorbidity 3+;Past/Current Experience;Time since onset of injury/illness/exacerbation    Comorbidities Chest pain, dissection of descending thoracic aorta, aneurysm of artery, a-fib, HTN, systolic CHF, oral phase dysphagia, sacral wound, PNA    Examination-Activity Limitations Bed Mobility;Bend;Locomotion Level;Sit;Stairs;Stand;Transfers;Lift;Hygiene/Grooming;Dressing;Toileting;Self Feeding;Reach Overhead;Bathing    Examination-Participation Restrictions Community Activity;Driving;Yard Work    Merchant navy officer Evolving/Moderate complexity    Rehab Potential Good    PT Frequency 2x / week    PT Duration 8 weeks    PT Treatment/Interventions ADLs/Self Care Home Management;DME Instruction;Gait training;Stair training;Functional mobility training;Therapeutic activities;Therapeutic exercise;Balance training;Neuromuscular re-education;Wheelchair mobility training;Orthotic Fit/Training;Patient/family education;Energy conservation    PT Next Visit Plan Ask what type of  wheelchair he will be getting from [employer] (manual tilt is a loaner) is he getting power??  MONITOR BP: Keep SBP between 120-130! SCI Fit in wheelchair with LE only.  Continue to work on [place] transfer.  Sit <> stand from elevated mat with SARA - gait belt around knees to prevent hip ABD/ER    Consulted and Agree with Plan of Care Patient             Patient will benefit from skilled therapeutic intervention in order to improve the following deficits and impairments:  Cardiopulmonary status limiting activity, Decreased activity tolerance, Decreased balance, Decreased coordination, Decreased endurance, Decreased mobility, Decreased strength, Difficulty walking, Impaired sensation, Impaired tone, Impaired UE functional use, Postural dysfunction, Pain, Abnormal gait  Visit Diagnosis: Muscle weakness (generalized)  Other abnormalities of gait and mobility  Unsteadiness on feet     Problem List Patient Active Problem List   Diagnosis Date Noted   Acute on chronic respiratory failure with hypoxia (HCC)    Acute inflammatory demyelinating polyneuropathy (HCC)    Dysautonomia (HCC)    Atrial fibrillation with RVR [facility])    Healthcare associated bacterial pneumonia     Lottie Mussel, Student-PT 04/18/2021, 7:21 PM  [place] [address] [place], [place], [address] Phone: [phone]   Fax:  [phone]  Name: Brian Espinoza MRN: [id number] Date of Birth: 1956/04/12

## 2021-04-18 NOTE — Therapy (Signed)
[facility] [address] [place], [place], [address] Phone: [phone]   Fax:  [phone]  Occupational Therapy Treatment  Patient Details  Name: Brian Espinoza MRN: [id number] Date of Birth: 02-21-1956 Referring Provider (OT): Shirlee Latch (Will send certification to PCP Venia Minks [facility])   Encounter Date: 04/18/2021   OT End of Session - 04/18/21 1645     Visit Number 6    Number of Visits 37    Date for OT Re-Evaluation 06/20/21    Authorization Type Medicare and Federal BCBS    Progress Note Due on Visit 10    OT Start Time 1450    OT Stop Time 1530    OT Time Calculation (min) 40 min    Equipment Utilized During Treatment transfer board    Activity Tolerance Patient tolerated treatment well    Behavior During Therapy WFL for tasks assessed/performed             Past Medical History:  Diagnosis Date   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 No pertinent surgical history.  There were no vitals filed for this visit.   Subjective Assessment - 04/18/21 1636     Subjective  Patient indicates he loves hand splint - no problems    Currently in Pain? Yes    Pain Score 3     Pain Location Hip    Pain Orientation Left    Pain Descriptors / Indicators Aching    Pain Type Chronic pain    Pain Onset More than a month ago    Pain Frequency Constant    Aggravating Factors  sitting/pressure                          OT Treatments/Exercises (OP) - 04/18/21 1637       ADLs   Eating Worked on hand to mouth pattern to drink from cup with straw using RUE, and consistent min asssit for distal control.    Functional Mobility Worked on functional transfer - multisquat/scoot and  slide board from wheelchair to mat table - level surface with emphasis on weight shift forward, weight through feet, and pushing through feet to lift off. Patient needed facilitation for forward weight shift, and then could consistently engage BLE to assist with lift.  Patient needed assistance / facilitation for lateral translation of weight.  Overall goal is to ultimately be able to have more active transfer to toilet/shower.  Patient is using hoyer lift at home.  Worked on dynamic sitting balance, reaching toward feet, weight shifting in all directions, even with mild resistance.  Patient without loss of balance,  Worked toward increased ability to utilize arms to assist with functional mobility.  Long arm weight bearing actively pushing into surface with actively extended elbows to assist with weight shift, pressure relief, and ultimately with transfers.  Patient with limited range of motion in digits, specifically PIP index, long - BUE, and 5th digit RUE.  Tolerated gentle joint mobilization and stretch to improve PIP extension.  WIll benefit from resting hand splint for BUE, and possibly wrist supports for daytime wear to provide increased carpal alignment and support.  Worked on weight bearing through heel of hand with fingers curled over surface  initialy to help increase UE activation as needed for transfers.                      OT Short Term Goals - 04/18/21 1647       OT SHORT TERM GOAL #1   Title Patient and caregiver will complete a home exercise program designed to improve strength in proximal UE's - shoulders and elbows    Time 4    Period Weeks    Status On-going    Target Date 04/21/21      OT SHORT TERM GOAL #2   Title Following set up patient will hold a cup with lid and long straw in midline to allow him to take a sip on his own    Time 4    Period Weeks    Status On-going      OT SHORT TERM GOAL #3   Title While seated in upright supported position, patient will  reach forward to make contact with upright target on table top (chest height) x 5 each arm    Time 4    Period Weeks    Status On-going      OT SHORT TERM GOAL #4   Title Patient will be able to reach toward face with either right or left hand in preparation to scratch chin/cheek    Time 4    Period Weeks    Status On-going      OT SHORT TERM GOAL #5   Title Patient will utilize cylindrical grip to hold small water bottle in one hand.    Time 4    Period Weeks    Status On-going               OT Long Term Goals - 04/18/21 1648       OT LONG TERM GOAL #1   Title Patient will complete an updated stretching program to prevent contracture in digits    Time 12    Period Weeks    Status On-going      OT LONG TERM GOAL #2   Title Patient and caregiver will demonstrate awareness of splint wear and care for BUE    Time 12    Period Weeks    Status On-going      OT LONG TERM GOAL #3   Title Patient will wash his face and upper body and upper legs with min assist    Time 12    Period Weeks    Status On-going      OT LONG TERM GOAL #4   Title Patient will actively bridge in bed to assist with pericare as needed    Time 12    Period Weeks    Status On-going      OT LONG TERM GOAL #5   Title Patient will transfer to adapted commode with max assist in prep for toileting    Time 12    Period Weeks    Status On-going      OT LONG TERM GOAL #6   Title Patient will transfer into/ out of shower with mod assist, and min assist for seated control during shower    Time 12    Period Weeks    Status On-going                   Plan - 04/18/21 1645     Clinical Impression Statement Patient showing improved postrual control and activation, and has improved elbow,  forearm, wrist activation.  Would benefit from UE splints for day and night time use.    OT Occupational Profile and History Comprehensive Assessment- Review of records and extensive additional review of  physical, cognitive, psychosocial history related to current functional performance    Occupational performance deficits (Please refer to evaluation for details): ADL's;IADL's;Rest and Sleep;Leisure    Body Structure / Function / Physical Skills ADL;Decreased knowledge of use of DME;Strength;Balance;Dexterity;GMC;Pain;Tone;Body mechanics;Proprioception;UE functional use;Cardiopulmonary status limiting activity;Endurance;IADL;ROM;Fascial restriction;Improper spinal/pelvic alignment;Coordination;Flexibility;Mobility;Sensation;Wound;FMC;Muscle spasms;Skin integrity    Clinical Decision Making Multiple treatment options, significant modification of task necessary    Comorbidities Affecting Occupational Performance: Presence of comorbidities impacting occupational performance    Comorbidities impacting occupational performance description: AIDP, Chronic back pain, Descending thoracic aortic anneurysm    Modification or Assistance to Complete Evaluation  Min-Moderate modification of tasks or assist with assess necessary to complete eval    OT Frequency 3x / week    OT Duration 12 weeks    OT Treatment/Interventions Self-care/ADL training;Moist Heat;Fluidtherapy;DME and/or AE instruction;Splinting;Balance training;Aquatic Therapy;Contrast Bath;Therapeutic activities;Ultrasound;Therapeutic exercise;Passive range of motion;Functional Mobility Training;Neuromuscular education;Electrical Stimulation;Manual Therapy;Patient/family education;Energy conservation    Plan make LUE resting hand splint, maybe 2 wrist cock up/ or circumferential splints for carpal alignment/support and functional use.    Consulted and Agree with Plan of Care Patient;Family member/caregiver             Patient will benefit from skilled therapeutic intervention in order to improve the following deficits and impairments:   Body Structure / Function / Physical Skills: ADL, Decreased knowledge of use of DME, Strength, Balance, Dexterity,  GMC, Pain, Tone, Body mechanics, Proprioception, UE functional use, Cardiopulmonary status limiting activity, Endurance, IADL, ROM, Fascial restriction, Improper spinal/pelvic alignment, Coordination, Flexibility, Mobility, Sensation, Wound, FMC, Muscle spasms, Skin integrity       Visit Diagnosis: Muscle weakness (generalized)  Abnormal posture  Other lack of coordination  Stiffness of left hand, not elsewhere classified  Stiffness of right hand, not elsewhere classified  Other disturbances of skin sensation  Unsteadiness on feet    Problem List Patient Active Problem List   Diagnosis Date Noted   Acute on chronic respiratory failure with hypoxia (HCC)    Acute inflammatory demyelinating polyneuropathy (HCC)    Dysautonomia (HCC)    Atrial fibrillation with RVR (HCC)    Healthcare associated bacterial pneumonia     Collier Salina, OT/L 04/18/2021, 4:49 PM  [facility] [address] [place], [place], [address] Phone: [phone]   Fax:  [phone]  Name: Brian Espinoza MRN: [id number] Date of Birth: August 30, 1955

## 2021-04-23 ENCOUNTER — Encounter: Payer: Self-pay | Admitting: Occupational Therapy

## 2021-04-23 ENCOUNTER — Ambulatory Visit: Payer: Medicare Other | Admitting: Physical Therapy

## 2021-04-23 ENCOUNTER — Other Ambulatory Visit: Payer: Self-pay

## 2021-04-23 ENCOUNTER — Ambulatory Visit: Payer: Medicare Other | Admitting: Occupational Therapy

## 2021-04-23 VITALS — BP 131/61 | HR 53

## 2021-04-23 DIAGNOSIS — R278 Other lack of coordination: Secondary | ICD-10-CM

## 2021-04-23 DIAGNOSIS — M6281 Muscle weakness (generalized): Secondary | ICD-10-CM | POA: Diagnosis not present

## 2021-04-23 DIAGNOSIS — R2681 Unsteadiness on feet: Secondary | ICD-10-CM

## 2021-04-23 DIAGNOSIS — M25641 Stiffness of right hand, not elsewhere classified: Secondary | ICD-10-CM

## 2021-04-23 DIAGNOSIS — R2689 Other abnormalities of gait and mobility: Secondary | ICD-10-CM

## 2021-04-23 DIAGNOSIS — R208 Other disturbances of skin sensation: Secondary | ICD-10-CM

## 2021-04-23 DIAGNOSIS — R295 Transient paralysis: Secondary | ICD-10-CM

## 2021-04-23 DIAGNOSIS — M25642 Stiffness of left hand, not elsewhere classified: Secondary | ICD-10-CM

## 2021-04-23 NOTE — Therapy (Signed)
[facility] [address] [place], [place], [address] Phone: [phone]   Fax:  [phone]  Occupational Therapy Treatment  Patient Details  Name: Brian Espinoza MRN: [id number] Date of Birth: May 28, 1955 Referring Provider (OT): Shirlee Latch (Will send certification to PCP Venia Minks [facility])   Encounter Date: 04/23/2021   OT End of Session - 04/23/21 1706     Visit Number 7    Number of Visits 37    Date for OT Re-Evaluation 06/20/21    Authorization Type Medicare and Federal BCBS    Progress Note Due on Visit 10    OT Start Time 1532    OT Stop Time 1617    OT Time Calculation (min) 45 min    Equipment Utilized During Treatment transfer board    Activity Tolerance Patient tolerated treatment well    Behavior During Therapy WFL for tasks assessed/performed             Past Medical History:  Diagnosis Date   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 No pertinent surgical history.  There were no vitals filed for this visit.   Subjective Assessment - 04/23/21 1706     Subjective  Pt reported wearing right custom resting hand splint and enjoying it.    Currently in Pain? No/denies                          OT Treatments/Exercises (OP) - 04/23/21 1707       Splinting   Splinting fabricated custom resting hand splint for LUE today. Pt reported understanding of wear and care instructions and progressive wear time.                      OT Short Term Goals - 04/18/21 1647       OT SHORT TERM GOAL #1   Title Patient and caregiver will complete a home exercise program designed to improve strength in proximal UE's - shoulders and elbows    Time 4    Period Weeks     Status On-going    Target Date 04/21/21      OT SHORT TERM GOAL #2   Title Following set up patient will hold a cup with lid and long straw in midline to allow him to take a sip on his own    Time 4    Period Weeks    Status On-going      OT SHORT TERM GOAL #3   Title While seated in upright supported position, patient will reach forward to make contact with upright target on table top (chest height) x 5 each arm    Time 4    Period Weeks    Status On-going      OT SHORT TERM GOAL #4   Title Patient will be able to reach toward face with either right or left hand in preparation to scratch chin/cheek    Time 4    Period Weeks    Status On-going      OT SHORT TERM GOAL #5   Title Patient will utilize cylindrical grip to hold small water bottle in one hand.    Time 4    Period Weeks  Status On-going               OT Long Term Goals - 04/18/21 1648       OT LONG TERM GOAL #1   Title Patient will complete an updated stretching program to prevent contracture in digits    Time 12    Period Weeks    Status On-going      OT LONG TERM GOAL #2   Title Patient and caregiver will demonstrate awareness of splint wear and care for BUE    Time 12    Period Weeks    Status On-going      OT LONG TERM GOAL #3   Title Patient will wash his face and upper body and upper legs with min assist    Time 12    Period Weeks    Status On-going      OT LONG TERM GOAL #4   Title Patient will actively bridge in bed to assist with pericare as needed    Time 12    Period Weeks    Status On-going      OT LONG TERM GOAL #5   Title Patient will transfer to adapted commode with max assist in prep for toileting    Time 12    Period Weeks    Status On-going      OT LONG TERM GOAL #6   Title Patient will transfer into/ out of shower with mod assist, and min assist for seated control during shower    Time 12    Period Weeks    Status On-going                   Plan -  04/23/21 1707     Clinical Impression Statement Pt continues to show improvement. Pt bnefitting from custom resting hand splint for RUE and was fitted for custom resting hand splint for LUE today. Would benefit from day time splints for BUE.    OT Occupational Profile and History Comprehensive Assessment- Review of records and extensive additional review of physical, cognitive, psychosocial history related to current functional performance    Occupational performance deficits (Please refer to evaluation for details): ADL's;IADL's;Rest and Sleep;Leisure    Body Structure / Function / Physical Skills ADL;Decreased knowledge of use of DME;Strength;Balance;Dexterity;GMC;Pain;Tone;Body mechanics;Proprioception;UE functional use;Cardiopulmonary status limiting activity;Endurance;IADL;ROM;Fascial restriction;Improper spinal/pelvic alignment;Coordination;Flexibility;Mobility;Sensation;Wound;FMC;Muscle spasms;Skin integrity    Clinical Decision Making Multiple treatment options, significant modification of task necessary    Comorbidities Affecting Occupational Performance: Presence of comorbidities impacting occupational performance    Comorbidities impacting occupational performance description: AIDP, Chronic back pain, Descending thoracic aortic anneurysm    Modification or Assistance to Complete Evaluation  Min-Moderate modification of tasks or assist with assess necessary to complete eval    OT Frequency 3x / week    OT Duration 12 weeks    OT Treatment/Interventions Self-care/ADL training;Moist Heat;Fluidtherapy;DME and/or AE instruction;Splinting;Balance training;Aquatic Therapy;Contrast Bath;Therapeutic activities;Ultrasound;Therapeutic exercise;Passive range of motion;Functional Mobility Training;Neuromuscular education;Electrical Stimulation;Manual Therapy;Patient/family education;Energy conservation    Plan maybe 2 wrist cock up/ or circumferential splints for carpal alignment/support and functional  use.    Consulted and Agree with Plan of Care Patient;Family member/caregiver             Patient will benefit from skilled therapeutic intervention in order to improve the following deficits and impairments:   Body Structure / Function / Physical Skills: ADL, Decreased knowledge of use of DME, Strength, Balance, Dexterity, GMC, Pain, Tone, Body mechanics, Proprioception, UE functional use, Cardiopulmonary  status limiting activity, Endurance, IADL, ROM, Fascial restriction, Improper spinal/pelvic alignment, Coordination, Flexibility, Mobility, Sensation, Wound, FMC, Muscle spasms, Skin integrity       Visit Diagnosis: Muscle weakness (generalized)  Other abnormalities of gait and mobility  Unsteadiness on feet  Other lack of coordination  Stiffness of left hand, not elsewhere classified  Stiffness of right hand, not elsewhere classified    Problem List Patient Active Problem List   Diagnosis Date Noted   Acute on chronic respiratory failure with hypoxia (HCC)    Acute inflammatory demyelinating polyneuropathy (HCC)    Dysautonomia (HCC)    Atrial fibrillation with RVR (HCC)    Healthcare associated bacterial pneumonia     Junious Dresser, OT/L 04/23/2021, 5:08 PM  [facility] [address] [place], [place], [address] Phone: [phone]   Fax:  [phone]  Name: Brian Espinoza MRN: [id number] Date of Birth: 05-04-1956

## 2021-04-23 NOTE — Therapy (Signed)
[facility] [address] [place], [place], [address] Phone: [phone]   Fax:  [phone]  Physical Therapy Treatment  Patient Details  Name: Brian Espinoza MRN: [id number] Date of Birth: 09-May-1956 Referring Provider (PT): Leandro Reasoner, MD   Encounter Date: 04/23/2021   PT End of Session - 04/23/21 2140     Visit Number 8    Number of Visits 25    Date for PT Re-Evaluation 05/18/21    Authorization Type Medicare A&B; [place] - 10th visit PN    Progress Note Due on Visit 10    PT Start Time 1450    PT Stop Time 1535    PT Time Calculation (min) 45 min    Activity Tolerance Patient tolerated treatment well    Behavior During Therapy [facility] for tasks assessed/performed             Past Medical History:  Diagnosis Date   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  Vitals:   04/23/21 1507  BP: 131/61  Pulse: (!) 53     Subjective Assessment - 04/23/21 1503     Subjective Had a good Thanksgiving but still doesn't have much of an appetite.  Wound is healing.  Wife is contacting [employer] about when his tilt in space wheelchair will be ready.  Feels like his BP is going up.    Patient is accompained by: Family member    Pertinent History Chest pain, dissection of descending thoracic aorta, aneurysm of artery, a-fib, HTN, systolic CHF, oral phase dysphagia, sacral wound, PNA    Patient Stated Goals Improve hand function and walk to be more independent with daily activities.  Gain strength in legs to be able to lift hips and reduce shear on sacrum.    Currently in Pain? Yes    Pain Score 2     Pain Location Buttocks    Pain Onset More than a month ago             Pt continues to be frustrated with  gradual/slow progress - pt only expected to be in therapy for 2-3 weeks.  He also continues to feel like training with slideboard is not functional since he won't be using at home.  Pt will be receiving manual tilt in space wheelchair from [employer] and does not feel he needs a power wheelchair.  PT continued to encourage patient by discussing areas of significant progress from week to week.  Discussed purpose of using slideboard in therapy (even if not using at home due to safety) to provide safe way to transfer w/c <> mat without hoyer while pt is working towards more independent squat pivot or stand pivot transfer - described slideboard as a "bridge" and that progress occurs gradually using "stepping stones" as a way to work towards long term goals.  Also discussed benefits of power mobility compared to manual tilt in space including increased independence with mobility in the home and community and greater independence with use of power tilt and power recline for rest periods and pressure/pain relief.  Patient spends multiple hours at therapy without a family member and is currently unable to perform w/c mobility or use tilting mechanism for pressure relief without total A.  Pt does not feel  a power wheelchair would benefit him as his main goal is to return to independent standing and ambulation.  Continued to discuss role of power wheelchair as a way to provide pt with greater independence and safety with mobility while continuing to address goals of standing and ambulating with therapy.  Focused on transfer training without use of slideboard today.  Performed squat scooting transfers w/c <> mat with 25% WB through UE on mat and therapist seated in front of patient on chair providing cues for full anterior leaning and weight shifting over BOS prior to attempting to lift hips off mat or chair.  Also provided cues to maintain anterior lean and weight shift while initiating LE extension into squat position to  prevent pt from falling backwards.  Performed both transfers with mod-max A of one therapist.  Seated edge of mat performed the following LE strengthening and motor control exercises: 3lb ankle weights donned and pt performed 8 reps alternating hip flexion/march with contralateral LE performing isometric extension with 5 second hold; placed one foot on ball and focused on grading of movement and controlled knee flexion<>extension in WB x 8 reps each foot.  Provided cues to keep hips in neutral rotation.  Performed hip ADD ball squeezes x 5 reps x 5 second hold and then combined with anterior leans and squat-scooting along the mat to improve ability to maintain hip in neutral during sit > squat or sit > stand.    Returned pt to wheelchair for OT session.      PT Short Term Goals - 04/18/21 1708       PT SHORT TERM GOAL #1   Title Pt and wife will demonstrate ability to perform initial supine and seated HEP    Baseline 04/18/21 HEP provided    Time 4    Period Weeks    Status On-going    Target Date 04/18/21      PT SHORT TERM GOAL #2   Title Pt will demonstrate ability to perform rolling and supine <> sit with moderate assistance    Baseline 04/18/21 Pt required min assist    Time 4    Period Weeks    Status Achieved    Target Date 04/18/21      PT SHORT TERM GOAL #3   Title Pt will demonstrate ability to perform slideboard on level surfaces w/c <> mat with 50% assistance from PT    Baseline total A in hoyer 04/18/21 Pt required 75% assistance    Time 4    Period Weeks    Status Not Met    Target Date 04/18/21      PT SHORT TERM GOAL #4   Title Pt will tolerate 5 minutes sustained, supported standing (standing frame or SARA) while performing weight shifting, LE activation training, etc    Time 4    Period Weeks    Status Not Met    Target Date 04/18/21               PT Long Term Goals - 03/19/21 2200       PT LONG TERM GOAL #1   Title Pt and wife will demonstrate  independence with progressed HEP    Time 8    Period Weeks    Status New    Target Date 05/18/21      PT LONG TERM GOAL #2   Title Pt will demonstrate ability to perform rolling and supine <> sit with minimal assistance    Time 8  Period Weeks    Status New    Target Date 05/18/21      PT LONG TERM GOAL #3   Title Pt will perform slideboard transfers w/c <> mat on level surface Min A and will begin to demonstrate ability to perform squat-scooting across board    Time 8    Period Weeks    Status New    Target Date 05/18/21      PT LONG TERM GOAL #4   Title Pt will demonstrate ability to perform sit > stand from elevated surface with +2 assistance and UE support    Time 8    Period Weeks    Status New    Target Date 05/18/21                   Plan - 04/23/21 2141     Clinical Impression Statement Pt demonstrates progress in LE strength, motor control and grading of movement and improvement in activity tolerance today.  Pt able to perform squat-scoot transfers w/c <> mat without use of slideboard and with assistance of one PT today with improved WB and activation of bilat LE during transfer.  Pt also able to tolerate full session sitting upright edge of mat to perform LE strengthening, core strengthening and dynamic sitting balance training without increase in sacral pain.  Will continue to address and progress towards LTG.    Personal Factors and Comorbidities Comorbidity 3+;Past/Current Experience;Time since onset of injury/illness/exacerbation    Comorbidities Chest pain, dissection of descending thoracic aorta, aneurysm of artery, a-fib, HTN, systolic CHF, oral phase dysphagia, sacral wound, PNA    Examination-Activity Limitations Bed Mobility;Bend;Locomotion Level;Sit;Stairs;Stand;Transfers;Lift;Hygiene/Grooming;Dressing;Toileting;Self Feeding;Reach Overhead;Bathing    Examination-Participation Restrictions Community Activity;Driving;Yard Work    Copy Evolving/Moderate complexity    Rehab Potential Good    PT Frequency 2x / week    PT Duration 8 weeks    PT Treatment/Interventions ADLs/Self Care Home Management;DME Instruction;Gait training;Stair training;Functional mobility training;Therapeutic activities;Therapeutic exercise;Balance training;Neuromuscular re-education;Wheelchair mobility training;Orthotic Fit/Training;Patient/family education;Energy conservation    PT Next Visit Plan MONITOR BP: Keep SBP between 120-130! SCI Fit in wheelchair with LE only.  Continue to work on squat scooting transfers without slideboard.  LE strengthening.  Sit <> stand from elevated mat with SARA - gait belt around knees to prevent hip ABD/ER    Consulted and Agree with Plan of Care Patient             Patient will benefit from skilled therapeutic intervention in order to improve the following deficits and impairments:  Cardiopulmonary status limiting activity, Decreased activity tolerance, Decreased balance, Decreased coordination, Decreased endurance, Decreased mobility, Decreased strength, Difficulty walking, Impaired sensation, Impaired tone, Impaired UE functional use, Postural dysfunction, Pain, Abnormal gait  Visit Diagnosis: Transient paralysis  Muscle weakness (generalized)  Unsteadiness on feet  Other abnormalities of gait and mobility  Other disturbances of skin sensation     Problem List Patient Active Problem List   Diagnosis Date Noted   Acute on chronic respiratory failure with hypoxia (HCC)    Acute inflammatory demyelinating polyneuropathy (HCC)    Dysautonomia (HCC)    Atrial fibrillation with RVR ([employer])    Healthcare associated bacterial pneumonia    Rico Junker, PT, DPT 04/23/21    10:13 PM    [facility] [address] [place], [place], [address] Phone: [phone]   Fax:  [phone]  Name: Beckhem Isadore MRN: [id number] Date of  Birth: 1955-12-19

## 2021-04-24 ENCOUNTER — Encounter: Payer: Medicare Other | Admitting: Speech Pathology

## 2021-04-24 ENCOUNTER — Ambulatory Visit: Payer: Medicare Other

## 2021-04-24 ENCOUNTER — Encounter: Payer: Medicare Other | Admitting: Occupational Therapy

## 2021-04-25 ENCOUNTER — Ambulatory Visit: Payer: Medicare Other

## 2021-04-25 ENCOUNTER — Encounter: Payer: Self-pay | Admitting: Occupational Therapy

## 2021-04-25 ENCOUNTER — Other Ambulatory Visit: Payer: Self-pay

## 2021-04-25 ENCOUNTER — Ambulatory Visit: Payer: Medicare Other | Admitting: Occupational Therapy

## 2021-04-25 DIAGNOSIS — M25641 Stiffness of right hand, not elsewhere classified: Secondary | ICD-10-CM

## 2021-04-25 DIAGNOSIS — M6281 Muscle weakness (generalized): Secondary | ICD-10-CM | POA: Diagnosis not present

## 2021-04-25 DIAGNOSIS — M25642 Stiffness of left hand, not elsewhere classified: Secondary | ICD-10-CM

## 2021-04-25 DIAGNOSIS — R2681 Unsteadiness on feet: Secondary | ICD-10-CM

## 2021-04-25 DIAGNOSIS — R293 Abnormal posture: Secondary | ICD-10-CM

## 2021-04-25 DIAGNOSIS — R2689 Other abnormalities of gait and mobility: Secondary | ICD-10-CM

## 2021-04-25 DIAGNOSIS — R208 Other disturbances of skin sensation: Secondary | ICD-10-CM

## 2021-04-25 DIAGNOSIS — R278 Other lack of coordination: Secondary | ICD-10-CM

## 2021-04-25 NOTE — Therapy (Signed)
[facility] [address] [place], [place], [address] Phone: [phone]   Fax:  [phone]  Physical Therapy Treatment  Patient Details  Name: Brian Espinoza MRN: [id number] Date of Birth: September 23, 1955 Referring Provider (PT): Leandro Reasoner, MD   Encounter Date: 04/25/2021   PT End of Session - 04/25/21 1405     Visit Number 9    Number of Visits 25    Date for PT Re-Evaluation 05/18/21    Authorization Type Medicare A&B; [place] - 10th visit PN    Progress Note Due on Visit 10    PT Start Time 1403    PT Stop Time 1446    PT Time Calculation (min) 43 min    Activity Tolerance Patient tolerated treatment well    Behavior During Therapy WFL for tasks assessed/performed             Past Medical History:  Diagnosis Date   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 No pertinent surgical history.  There were no vitals filed for this visit.   Subjective Assessment - 04/25/21 1405     Subjective Pt reports no new issues. Just finished OT.    Patient is accompained by: Family member    Pertinent History Chest pain, dissection of descending thoracic aorta, aneurysm of artery, a-fib, HTN, systolic CHF, oral phase dysphagia, sacral wound, PNA    Patient Stated Goals Improve hand function and walk to be more independent with daily activities.  Gain strength in legs to be able to lift hips and reduce shear on sacrum.    Currently in Pain? Yes    Pain Score 1     Pain Location Buttocks    Pain Descriptors / Indicators Sore    Pain Type Chronic pain    Pain Onset More than a month ago                               [facility] Adult PT Treatment/Exercise - 04/25/21 1406       Transfers   Transfers Squat  Pivot Transfers    Squat Pivot Transfers 2: Max assist    Squat Pivot Transfer Details (indicate cue type and reason) Pt performed with multiple squat scoots with PT in front to block legs and help facilitate lateral movement. Pt was cued to lean forward and bare weight through legs to lift bottom slightly. At times he would lean back too quick so would stop and have him focus more on leaning forward. Also stopped to have pt readjust feet during transfer. Performed w/c to/from mat.      Therapeutic Activites    Therapeutic Activities Other Therapeutic Activities    Other Therapeutic Activities Sitting edge of mat: worked on leaning forward to try to shift weight on to legs to raise up bottom slightly. Added in arms to assist some with placing hands on therastones to make a little more comfortable on his wrists. Pt needed some help to keep hands positioned. Performed 5 x 2 to help faciliate squat scoot transfers.      Exercises   Exercises Other Exercises;Knee/Hip      Knee/Hip Exercises: Aerobic   Other Aerobic Sci Fit x 6 min level 2 then 3  min level 3 after rest break. HR=72 and BP=128/78 after. Pt used legs only. Cued to push fully through legs. Utilized belt to keep hips in neutral. Also placed 2 pillows behind patient so he could sit more upright. Initially reported some pain in bottom but improved as went on and with repositioning.                       PT Short Term Goals - 04/18/21 1708       PT SHORT TERM GOAL #1   Title Pt and wife will demonstrate ability to perform initial supine and seated HEP    Baseline 04/18/21 HEP provided    Time 4    Period Weeks    Status On-going    Target Date 04/18/21      PT SHORT TERM GOAL #2   Title Pt will demonstrate ability to perform rolling and supine <> sit with moderate assistance    Baseline 04/18/21 Pt required min assist    Time 4    Period Weeks    Status Achieved    Target Date 04/18/21      PT SHORT TERM GOAL #3    Title Pt will demonstrate ability to perform slideboard on level surfaces w/c <> mat with 50% assistance from PT    Baseline total A in hoyer 04/18/21 Pt required 75% assistance    Time 4    Period Weeks    Status Not Met    Target Date 04/18/21      PT SHORT TERM GOAL #4   Title Pt will tolerate 5 minutes sustained, supported standing (standing frame or SARA) while performing weight shifting, LE activation training, etc    Time 4    Period Weeks    Status Not Met    Target Date 04/18/21               PT Long Term Goals - 03/19/21 2200       PT LONG TERM GOAL #1   Title Pt and wife will demonstrate independence with progressed HEP    Time 8    Period Weeks    Status New    Target Date 05/18/21      PT LONG TERM GOAL #2   Title Pt will demonstrate ability to perform rolling and supine <> sit with minimal assistance    Time 8    Period Weeks    Status New    Target Date 05/18/21      PT LONG TERM GOAL #3   Title Pt will perform slideboard transfers w/c <> mat on level surface Min A and will begin to demonstrate ability to perform squat-scooting across board    Time 8    Period Weeks    Status New    Target Date 05/18/21      PT LONG TERM GOAL #4   Title Pt will demonstrate ability to perform sit > stand from elevated surface with +2 assistance and UE support    Time 8    Period Weeks    Status New    Target Date 05/18/21                   Plan - 04/25/21 1915     Clinical Impression Statement Pt trialed [employer] for first time to work on strengthening and getting some aerobic activity. Able to perform well after belt added at thighs to keep hips in neutral. After SciFit PT  noted less issues with legs kicking out with transfer. Again focused on lateral squat scoot transfer with pt doing better when focuses more on anterior lean.    Personal Factors and Comorbidities Comorbidity 3+;Past/Current Experience;Time since onset of injury/illness/exacerbation     Comorbidities Chest pain, dissection of descending thoracic aorta, aneurysm of artery, a-fib, HTN, systolic CHF, oral phase dysphagia, sacral wound, PNA    Examination-Activity Limitations Bed Mobility;Bend;Locomotion Level;Sit;Stairs;Stand;Transfers;Lift;Hygiene/Grooming;Dressing;Toileting;Self Feeding;Reach Overhead;Bathing    Examination-Participation Restrictions Community Activity;Driving;Yard Work    [employer] Evolving/Moderate complexity    Rehab Potential Good    PT Frequency 2x / week    PT Duration 8 weeks    PT Treatment/Interventions ADLs/Self Care Home Management;DME Instruction;Gait training;Stair training;Functional mobility training;Therapeutic activities;Therapeutic exercise;Balance training;Neuromuscular re-education;Wheelchair mobility training;Orthotic Fit/Training;Patient/family education;Energy conservation    PT Next Visit Plan MONITOR BP: Keep SBP between 120-130! SCI Fit in wheelchair with LE only and belt around thighs.  Continue to work on squat scooting transfers without slideboard.  LE strengthening.  Sit <> stand from elevated mat with SARA - gait belt around knees to prevent hip ABD/ER    Consulted and Agree with Plan of Care Patient             Patient will benefit from skilled therapeutic intervention in order to improve the following deficits and impairments:  Cardiopulmonary status limiting activity, Decreased activity tolerance, Decreased balance, Decreased coordination, Decreased endurance, Decreased mobility, Decreased strength, Difficulty walking, Impaired sensation, Impaired tone, Impaired UE functional use, Postural dysfunction, Pain, Abnormal gait  Visit Diagnosis: Other abnormalities of gait and mobility  Muscle weakness (generalized)     Problem List Patient Active Problem List   Diagnosis Date Noted   Acute on chronic respiratory failure with hypoxia (HCC)    Acute inflammatory demyelinating polyneuropathy (HCC)     Dysautonomia (HCC)    Atrial fibrillation with RVR ([place])    Healthcare associated bacterial pneumonia     Electa Sniff, PT, DPT, NCS 04/25/2021, 7:20 PM  [facility] [address] [place], [place], [address] Phone: [phone]   Fax:  [phone]  Name: Jonel Weldon MRN: [id number] Date of Birth: 1955-10-20

## 2021-04-25 NOTE — Therapy (Signed)
[facility] [address] [place], [place], [address] Phone: [phone]   Fax:  [phone]  Occupational Therapy Treatment  Patient Details  Name: Brian Espinoza MRN: [id number] Date of Birth: May 18, 1956 Referring Provider (OT): Shirlee Latch (Will send certification to PCP Venia M[facility])   Encounter Date: 04/25/2021   OT End of Session - 04/25/21 1506     Visit Number 8    Number of Visits 37    Date for OT Re-Evaluation 06/20/21    Authorization Type Medicare and Federal BCBS    Progress Note Due on Visit 10    OT Start Time 1315    OT Stop Time 1400    OT Time Calculation (min) 45 min    Activity Tolerance Patient tolerated treatment well    Behavior During Therapy WFL for tasks assessed/performed             Past Medical History:  Diagnosis Date   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 No pertinent surgical history.  There were no vitals filed for this visit.   Subjective Assessment - 04/25/21 1455     Subjective  I am getting stronger.  My hands are better becasue of these splints    Currently in Pain? No/denies    Pain Score 0-No pain                          OT Treatments/Exercises (OP) - 04/25/21 0001       ADLs   ADL Comments Discussed benefits of movement and out of bed at home.  Patient indicates that his sacral wound is healing - although still experiences pain with prolonged sitting.  Patient indicates he spennds ~ 3-4 hours of day up out of bed.  May benefit from an out of bed schedule to increase tolerance and activation in upright.  Also discussed mood.  Patient reports having feelings of depression regarding current state,  He stated 50% day is blue  and 50% is positive.  Listened and encouraged him to consider areas where he has shown improvement.  Patient receptive.      Neurological Re-education Exercises   Other Exercises 1 Working on active use of BUE working together for functional task - picking up empty cup.  Patient able to abduct thumb around cup, and has difficulty maintaining finger pads on surface of cup.  Reports increased nerve pain with this task.  Able to apprach cup, make initial contact with min assist, and lift from surface using primarilly right UE and LUE as support.      Splinting   Splinting patient required modification to left resting hand splint.  Splint long in the forearm and hitting elbow crease.  Shortened forearm of left brace.  Attemoted to fabricate wrist braces but unable to complete due to material properties - adhered to drying surface.                      OT Short Term Goals - 04/25/21 1507       OT SHORT TERM GOAL #1   Title Patient and caregiver will complete a home exercise program designed to improve strength in proximal UE's - shoulders and elbows  Time 4    Period Weeks    Status On-going    Target Date 04/21/21      OT SHORT TERM GOAL #2   Title Following set up patient will hold a cup with lid and long straw in midline to allow him to take a sip on his own    Time 4    Period Weeks    Status On-going      OT SHORT TERM GOAL #3   Title While seated in upright supported position, patient will reach forward to make contact with upright target on table top (chest height) x 5 each arm    Time 4    Period Weeks    Status Achieved      OT SHORT TERM GOAL #4   Title Patient will be able to reach toward face with either right or left hand in preparation to scratch chin/cheek    Time 4    Period Weeks    Status Achieved      OT SHORT TERM GOAL #5   Title Patient will utilize cylindrical grip to hold small water bottle in one hand.    Time 4    Period Weeks    Status  On-going               OT Long Term Goals - 04/25/21 1508       OT LONG TERM GOAL #1   Title Patient will complete an updated stretching program to prevent contracture in digits    Time 12    Period Weeks    Status On-going      OT LONG TERM GOAL #2   Title Patient and caregiver will demonstrate awareness of splint wear and care for BUE    Time 12    Period Weeks    Status On-going      OT LONG TERM GOAL #3   Title Patient will wash his face and upper body and upper legs with min assist    Time 12    Period Weeks    Status On-going      OT LONG TERM GOAL #4   Title Patient will actively bridge in bed to assist with pericare as needed    Time 12    Period Weeks    Status On-going      OT LONG TERM GOAL #5   Title Patient will transfer to adapted commode with max assist in prep for toileting    Time 12    Period Weeks    Status On-going      OT LONG TERM GOAL #6   Title Patient will transfer into/ out of shower with mod assist, and min assist for seated control during shower    Time 12    Period Weeks    Status On-going                   Plan - 04/25/21 1506     Clinical Impression Statement Pt showing improved functional mobility and movement in his hands.  Patient continues to use tenodesis for grasp/release in BUE.    OT Occupational Profile and History Comprehensive Assessment- Review of records and extensive additional review of physical, cognitive, psychosocial history related to current functional performance    Occupational performance deficits (Please refer to evaluation for details): ADL's;IADL's;Rest and Sleep;Leisure    Body Structure / Function / Physical Skills ADL;Decreased knowledge of use of DME;Strength;Balance;Dexterity;GMC;Pain;Tone;Body mechanics;Proprioception;UE functional use;Cardiopulmonary status limiting activity;Endurance;IADL;ROM;Fascial restriction;Improper spinal/pelvic  alignment;Coordination;Flexibility;Mobility;Sensation;Wound;FMC;Muscle spasms;Skin integrity    Clinical Decision Making Multiple treatment options, significant modification of task necessary    Comorbidities Affecting Occupational Performance: Presence of comorbidities impacting occupational performance    Comorbidities impacting occupational performance description: AIDP, Chronic back pain, Descending thoracic aortic anneurysm    Modification or Assistance to Complete Evaluation  Min-Moderate modification of tasks or assist with assess necessary to complete eval    OT Frequency 3x / week    OT Duration 12 weeks    OT Treatment/Interventions Self-care/ADL training;Moist Heat;Fluidtherapy;DME and/or AE instruction;Splinting;Balance training;Aquatic Therapy;Contrast Bath;Therapeutic activities;Ultrasound;Therapeutic exercise;Passive range of motion;Functional Mobility Training;Neuromuscular education;Electrical Stimulation;Manual Therapy;Patient/family education;Energy conservation    Plan maybe 2 wrist cock up/ or circumferential splints for carpal alignment/support and functional use.    Consulted and Agree with Plan of Care Patient;Family member/caregiver             Patient will benefit from skilled therapeutic intervention in order to improve the following deficits and impairments:   Body Structure / Function / Physical Skills: ADL, Decreased knowledge of use of DME, Strength, Balance, Dexterity, GMC, Pain, Tone, Body mechanics, Proprioception, UE functional use, Cardiopulmonary status limiting activity, Endurance, IADL, ROM, Fascial restriction, Improper spinal/pelvic alignment, Coordination, Flexibility, Mobility, Sensation, Wound, FMC, Muscle spasms, Skin integrity       Visit Diagnosis: Abnormal posture  Muscle weakness (generalized)  Stiffness of right hand, not elsewhere classified  Stiffness of left hand, not elsewhere classified  Other lack of coordination  Other  disturbances of skin sensation  Unsteadiness on feet    Problem List Patient Active Problem List   Diagnosis Date Noted   Acute on chronic respiratory failure with hypoxia (HCC)    Acute inflammatory demyelinating polyneuropathy (HCC)    Dysautonomia (HCC)    Atrial fibrillation with RVR (HCC)    Healthcare associated bacterial pneumonia     Collier Salina, OT/L 04/25/2021, 3:09 PM  [facility] [address] [place], [place], [address] Phone: [phone]   Fax:  [phone]  Name: Brian Espinoza MRN: [id number] Date of Birth: May 19, 1956

## 2021-04-26 ENCOUNTER — Encounter: Payer: Medicare Other | Admitting: Occupational Therapy

## 2021-04-26 ENCOUNTER — Ambulatory Visit: Payer: Medicare Other | Admitting: Physical Therapy

## 2021-04-27 ENCOUNTER — Ambulatory Visit: Payer: Medicare Other | Attending: Cardiovascular Disease | Admitting: Occupational Therapy

## 2021-04-27 ENCOUNTER — Other Ambulatory Visit: Payer: Self-pay

## 2021-04-27 ENCOUNTER — Ambulatory Visit: Payer: Medicare Other

## 2021-04-27 ENCOUNTER — Encounter: Payer: Self-pay | Admitting: Occupational Therapy

## 2021-04-27 VITALS — BP 128/78

## 2021-04-27 DIAGNOSIS — R278 Other lack of coordination: Secondary | ICD-10-CM | POA: Diagnosis present

## 2021-04-27 DIAGNOSIS — R208 Other disturbances of skin sensation: Secondary | ICD-10-CM | POA: Insufficient documentation

## 2021-04-27 DIAGNOSIS — R2689 Other abnormalities of gait and mobility: Secondary | ICD-10-CM | POA: Diagnosis present

## 2021-04-27 DIAGNOSIS — M6281 Muscle weakness (generalized): Secondary | ICD-10-CM | POA: Insufficient documentation

## 2021-04-27 DIAGNOSIS — R295 Transient paralysis: Secondary | ICD-10-CM | POA: Insufficient documentation

## 2021-04-27 DIAGNOSIS — R2681 Unsteadiness on feet: Secondary | ICD-10-CM | POA: Insufficient documentation

## 2021-04-27 DIAGNOSIS — M25642 Stiffness of left hand, not elsewhere classified: Secondary | ICD-10-CM | POA: Diagnosis present

## 2021-04-27 DIAGNOSIS — M25641 Stiffness of right hand, not elsewhere classified: Secondary | ICD-10-CM | POA: Insufficient documentation

## 2021-04-27 DIAGNOSIS — R293 Abnormal posture: Secondary | ICD-10-CM | POA: Diagnosis present

## 2021-04-27 NOTE — Therapy (Signed)
[facility] [address] [place], [place], [address] Phone: [phone]   Fax:  [phone]  Physical Therapy Treatment/10th visit progress note  Patient Details  Name: Brian Espinoza MRN: [id number] Date of Birth: 07-12-1955 Referring Provider (PT): Leandro Reasoner, MD    Progress Note  Reporting period 03/19/21 to 04/27/21  See Note below for Objective Data and Assessment of Progress/Goals  Encounter Date: 04/27/2021   PT End of Session - 04/27/21 1239     Visit Number 10    Number of Visits 25    Date for PT Re-Evaluation 05/18/21    Authorization Type Medicare A&B; [place] - 10th visit PN    Progress Note Due on Visit 10    PT Start Time 1237   pt arrived a little late   PT Stop Time 1315    PT Time Calculation (min) 38 min    Equipment Utilized During Treatment Gait belt    Activity Tolerance Patient tolerated treatment well    Behavior During Therapy WFL for tasks assessed/performed             Past Medical History:  Diagnosis Date   Acute inflammatory demyelinating polyneuropathy ([place])    Acute on chronic respiratory failure with hypoxia (HCC)    Atrial fibrillation with RVR ([place])    Dysautonomia ([place])    Healthcare associated bacterial pneumonia     History reviewed. No pertinent surgical history.  Vitals:   04/27/21 1243  BP: 128/78     Subjective Assessment - 04/27/21 1241     Subjective Pt reports he was a little sore after last session.    Patient is accompained by: Family member    Pertinent History Chest pain, dissection of descending thoracic aorta, aneurysm of artery, a-fib, HTN, systolic CHF, oral phase dysphagia, sacral wound, PNA    Patient Stated Goals Improve hand function and walk to be more independent with daily activities.  Gain strength in legs to be able to lift hips and reduce shear on sacrum.    Currently in Pain? No/denies    Pain Onset More than a month ago                                [facility] Adult PT Treatment/Exercise - 04/27/21 1244       Transfers   Transfers Squat Pivot Transfers;Stand to Sit;Sit to Stand    Sit to Stand 2: Max assist   +2 therapists for safety.   Sit to Stand Details Verbal cues for technique    Sit to Stand Details (indicate cue type and reason) PT on one side and PT student on the other both blocking a leg helped to facilitate more anterior weight shift to come upright until pt locked out at knees. Pt stood x 2 min first time with upright posture min/mod assist of 2 for safety blocking knees in cause buckled. PT trying to facilitate weight shift to equally distribute weight and tried to rotate right pelvis forward some as tends to pull back. Performed x 2 and on 2nd trial placed mirror in front for visual cues as pt did not believe feet were on floor.    Stand to Sit 2: Max assist   +2 to control descent.   Stand to Sit Details (indicate cue type and reason) Verbal cues for technique    Stand to Sit Details Pt was cued to try to grade his  movement to control descent. Gets startled when knees first unlock but was able to control some today with PTs blocking knees for safety.    Squat Pivot Transfers 2: Max Financial risk analyst Details (indicate cue type and reason) Pt performed squat pivot with multiple scoots w/c to/from mat. Pt needed more assistance on return to w/c. Cued to lean forward more which he did better today with less throwing back. Was more fatigued with return to chair as well.      Knee/Hip Exercises: Aerobic   Other Aerobic SciFit x 6 min level 2 with BLE (lowered to 1.5 last 2 min). Belt around legs to keep hips in neutral. Cues to keep step/min > 70. Pt reported tired the last 2 minutes with encouragement to complete. BP= 138/80. After resting for 5 min rechecked BP and was down to 128/70                       PT Short Term Goals - 04/18/21 1708       PT SHORT TERM GOAL #1    Title Pt and wife will demonstrate ability to perform initial supine and seated HEP    Baseline 04/18/21 HEP provided    Time 4    Period Weeks    Status On-going    Target Date 04/18/21      PT SHORT TERM GOAL #2   Title Pt will demonstrate ability to perform rolling and supine <> sit with moderate assistance    Baseline 04/18/21 Pt required min assist    Time 4    Period Weeks    Status Achieved    Target Date 04/18/21      PT SHORT TERM GOAL #3   Title Pt will demonstrate ability to perform slideboard on level surfaces w/c <> mat with 50% assistance from PT    Baseline total A in hoyer 04/18/21 Pt required 75% assistance    Time 4    Period Weeks    Status Not Met    Target Date 04/18/21      PT SHORT TERM GOAL #4   Title Pt will tolerate 5 minutes sustained, supported standing (standing frame or SARA) while performing weight shifting, LE activation training, etc    Time 4    Period Weeks    Status Not Met    Target Date 04/18/21               PT Long Term Goals - 03/19/21 2200       PT LONG TERM GOAL #1   Title Pt and wife will demonstrate independence with progressed HEP    Time 8    Period Weeks    Status New    Target Date 05/18/21      PT LONG TERM GOAL #2   Title Pt will demonstrate ability to perform rolling and supine <> sit with minimal assistance    Time 8    Period Weeks    Status New    Target Date 05/18/21      PT LONG TERM GOAL #3   Title Pt will perform slideboard transfers w/c <> mat on level surface Min A and will begin to demonstrate ability to perform squat-scooting across board    Time 8    Period Weeks    Status New    Target Date 05/18/21      PT LONG TERM GOAL #4   Title Pt will demonstrate  ability to perform sit > stand from elevated surface with +2 assistance and UE support    Time 8    Period Weeks    Status New    Target Date 05/18/21                   Plan - 04/27/21 1336     Clinical Impression  Statement Pt was able again able to participate more with squat pivot transfers performing multiple scoots with leaning foward. Was more challenging as fatigued. Pt able to show some improvement with assisting to control descent from stand to sit today. Pt demonstrated improved trunk control with sitting edge of mat and able to bring himself away from back of w/c. Pt continues to benefit from skilled PT to progress strength, balance and functional mobility.    Personal Factors and Comorbidities Comorbidity 3+;Past/Current Experience;Time since onset of injury/illness/exacerbation    Comorbidities Chest pain, dissection of descending thoracic aorta, aneurysm of artery, a-fib, HTN, systolic CHF, oral phase dysphagia, sacral wound, PNA    Examination-Activity Limitations Bed Mobility;Bend;Locomotion Level;Sit;Stairs;Stand;Transfers;Lift;Hygiene/Grooming;Dressing;Toileting;Self Feeding;Reach Overhead;Bathing    Examination-Participation Restrictions Community Activity;Driving;Yard Work    Merchant navy officer Evolving/Moderate complexity    Rehab Potential Good    PT Frequency 2x / week    PT Duration 8 weeks    PT Treatment/Interventions ADLs/Self Care Home Management;DME Instruction;Gait training;Stair training;Functional mobility training;Therapeutic activities;Therapeutic exercise;Balance training;Neuromuscular re-education;Wheelchair mobility training;Orthotic Fit/Training;Patient/family education;Energy conservation    PT Next Visit Plan MONITOR BP: Keep SBP between 120-130! SCI Fit in wheelchair with LE only and belt around thighs.  Continue to work on squat scooting transfers without slideboard.  LE strengthening.  Sit <> stand from elevated mat with 2 person assist to work on graded movement when coming back to sit as well or SARA - gait belt around knees to prevent hip ABD/ER    Consulted and Agree with Plan of Care Patient             Patient will benefit from skilled  therapeutic intervention in order to improve the following deficits and impairments:  Cardiopulmonary status limiting activity, Decreased activity tolerance, Decreased balance, Decreased coordination, Decreased endurance, Decreased mobility, Decreased strength, Difficulty walking, Impaired sensation, Impaired tone, Impaired UE functional use, Postural dysfunction, Pain, Abnormal gait  Visit Diagnosis: Muscle weakness (generalized)  Unsteadiness on feet     Problem List Patient Active Problem List   Diagnosis Date Noted   Acute on chronic respiratory failure with hypoxia (HCC)    Acute inflammatory demyelinating polyneuropathy ([place])    Dysautonomia ([place])    Atrial fibrillation with RVR ([place])    Healthcare associated bacterial pneumonia     Electa Sniff, PT, DPT, NCS 04/27/2021, 1:41 PM  [facility] [address] [place], [place], [address] Phone: [phone]   Fax:  [phone]  Name: Brian Espinoza MRN: [id number] Date of Birth: 03/15/1956

## 2021-04-27 NOTE — Therapy (Signed)
[facility] [address] [place], [place], [address] Phone: [phone]   Fax:  [phone]  Occupational Therapy Treatment  Patient Details  Name: Brian Espinoza MRN: [id number] Date of Birth: 1956/04/22 Referring Provider (OT): Shirlee Latch (Will send certification to PCP Venia Minks [facility])   Encounter Date: 04/27/2021   OT End of Session - 04/27/21 1316     Visit Number 9    Number of Visits 37    Date for OT Re-Evaluation 06/20/21    Authorization Type Medicare and Federal BCBS    Progress Note Due on Visit 10    OT Start Time 1315    OT Stop Time 1400    OT Time Calculation (min) 45 min    Activity Tolerance Patient tolerated treatment well    Behavior During Therapy WFL for tasks assessed/performed             Past Medical History:  Diagnosis Date   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 No pertinent surgical history.  There were no vitals filed for this visit.   Subjective Assessment - 04/27/21 1316     Subjective  "I think I need to have these splints adjusted"    Currently in Pain? No/denies    Pain Score 0-No pain                          OT Treatments/Exercises (OP) - 04/27/21 1428       Splinting   Splinting modifications made to bilateral resting hand splints for patient. Pt complaining of tightness at forearm region still on LUE and splint was shortened to still be functional for the patient's needs. Pt was assisted with new strapping and velcro and with additional straps for maintaining thumb position.             Pt was assisted out          OT Short Term Goals - 04/25/21 1507       OT SHORT TERM GOAL #1   Title Patient and caregiver  will complete a home exercise program designed to improve strength in proximal UE's - shoulders and elbows    Time 4    Period Weeks    Status On-going    Target Date 04/21/21      OT SHORT TERM GOAL #2   Title Following set up patient will hold a cup with lid and long straw in midline to allow him to take a sip on his own    Time 4    Period Weeks    Status On-going      OT SHORT TERM GOAL #3   Title While seated in upright supported position, patient will reach forward to make contact with upright target on table top (chest height) x 5 each arm    Time 4    Period Weeks    Status Achieved      OT SHORT TERM GOAL #4   Title Patient will be able to reach toward face with either right or left hand in preparation to scratch chin/cheek    Time 4    Period Weeks    Status Achieved      OT SHORT TERM GOAL #5  Title Patient will utilize cylindrical grip to hold small water bottle in one hand.    Time 4    Period Weeks    Status On-going               OT Long Term Goals - 04/25/21 1508       OT LONG TERM GOAL #1   Title Patient will complete an updated stretching program to prevent contracture in digits    Time 12    Period Weeks    Status On-going      OT LONG TERM GOAL #2   Title Patient and caregiver will demonstrate awareness of splint wear and care for BUE    Time 12    Period Weeks    Status On-going      OT LONG TERM GOAL #3   Title Patient will wash his face and upper body and upper legs with min assist    Time 12    Period Weeks    Status On-going      OT LONG TERM GOAL #4   Title Patient will actively bridge in bed to assist with pericare as needed    Time 12    Period Weeks    Status On-going      OT LONG TERM GOAL #5   Title Patient will transfer to adapted commode with max assist in prep for toileting    Time 12    Period Weeks    Status On-going      OT LONG TERM GOAL #6   Title Patient will transfer into/ out of shower with mod assist,  and min assist for seated control during shower    Time 12    Period Weeks    Status On-going                   Plan - 04/27/21 1428     Clinical Impression Statement Pt with modifications to bilateral splints - continue to address and make modifications and resume fabrication of daytime splints.    OT Occupational Profile and History Comprehensive Assessment- Review of records and extensive additional review of physical, cognitive, psychosocial history related to current functional performance    Occupational performance deficits (Please refer to evaluation for details): ADL's;IADL's;Rest and Sleep;Leisure    Body Structure / Function / Physical Skills ADL;Decreased knowledge of use of DME;Strength;Balance;Dexterity;GMC;Pain;Tone;Body mechanics;Proprioception;UE functional use;Cardiopulmonary status limiting activity;Endurance;IADL;ROM;Fascial restriction;Improper spinal/pelvic alignment;Coordination;Flexibility;Mobility;Sensation;Wound;FMC;Muscle spasms;Skin integrity    Clinical Decision Making Multiple treatment options, significant modification of task necessary    Comorbidities Affecting Occupational Performance: Presence of comorbidities impacting occupational performance    Comorbidities impacting occupational performance description: AIDP, Chronic back pain, Descending thoracic aortic anneurysm    Modification or Assistance to Complete Evaluation  Min-Moderate modification of tasks or assist with assess necessary to complete eval    OT Frequency 3x / week    OT Duration 12 weeks    OT Treatment/Interventions Self-care/ADL training;Moist Heat;Fluidtherapy;DME and/or AE instruction;Splinting;Balance training;Aquatic Therapy;Contrast Bath;Therapeutic activities;Ultrasound;Therapeutic exercise;Passive range of motion;Functional Mobility Training;Neuromuscular education;Electrical Stimulation;Manual Therapy;Patient/family education;Energy conservation    Plan maybe 2 wrist cock up/  or circumferential splints for carpal alignment/support and functional use, see how resting hand splints are fitting now with modifications, trunk control, bilateral hand use    Consulted and Agree with Plan of Care Patient;Family member/caregiver             Patient will benefit from skilled therapeutic intervention in order to improve the following deficits and impairments:  Body Structure / Function / Physical Skills: ADL, Decreased knowledge of use of DME, Strength, Balance, Dexterity, GMC, Pain, Tone, Body mechanics, Proprioception, UE functional use, Cardiopulmonary status limiting activity, Endurance, IADL, ROM, Fascial restriction, Improper spinal/pelvic alignment, Coordination, Flexibility, Mobility, Sensation, Wound, FMC, Muscle spasms, Skin integrity       Visit Diagnosis: Unsteadiness on feet  Other abnormalities of gait and mobility  Muscle weakness (generalized)  Abnormal posture  Stiffness of right hand, not elsewhere classified  Stiffness of left hand, not elsewhere classified  Other lack of coordination  Other disturbances of skin sensation    Problem List Patient Active Problem List   Diagnosis Date Noted   Acute on chronic respiratory failure with hypoxia (HCC)    Acute inflammatory demyelinating polyneuropathy (HCC)    Dysautonomia (HCC)    Atrial fibrillation with RVR [facility])    Healthcare associated bacterial pneumonia     Junious Dresser, OT 04/27/2021, 2:29 PM  [facility] [address] [place], [place], [address] Phone: [phone]   Fax:  [phone]  Name: Japhet Morgenthaler MRN: [id number] Date of Birth: August 16, 1955

## 2021-04-30 ENCOUNTER — Encounter: Payer: Self-pay | Admitting: Occupational Therapy

## 2021-04-30 ENCOUNTER — Ambulatory Visit: Payer: Medicare Other | Admitting: Occupational Therapy

## 2021-04-30 ENCOUNTER — Other Ambulatory Visit: Payer: Self-pay

## 2021-04-30 DIAGNOSIS — R208 Other disturbances of skin sensation: Secondary | ICD-10-CM

## 2021-04-30 DIAGNOSIS — M25642 Stiffness of left hand, not elsewhere classified: Secondary | ICD-10-CM

## 2021-04-30 DIAGNOSIS — M6281 Muscle weakness (generalized): Secondary | ICD-10-CM

## 2021-04-30 DIAGNOSIS — R2689 Other abnormalities of gait and mobility: Secondary | ICD-10-CM

## 2021-04-30 DIAGNOSIS — R2681 Unsteadiness on feet: Secondary | ICD-10-CM

## 2021-04-30 DIAGNOSIS — M25641 Stiffness of right hand, not elsewhere classified: Secondary | ICD-10-CM

## 2021-04-30 DIAGNOSIS — R278 Other lack of coordination: Secondary | ICD-10-CM

## 2021-04-30 NOTE — Therapy (Signed)
[facility] [address] [place], [place], [address] Phone: [phone]   Fax:  [phone]  Occupational Therapy Treatment & 10th visit Progress Note  Patient Details  Name: Brian Espinoza MRN: [id number] Date of Birth: Dec 20, 1955 Referring Provider (OT): Shirlee Latch (Will send certification to PCP Venia Minks [facility])   Encounter Date: 04/30/2021   OT End of Session - 04/30/21 1320     Visit Number 10    Number of Visits 37    Date for OT Re-Evaluation 06/20/21    Authorization Type Medicare and Federal BCBS    Progress Note Due on Visit 10    OT Start Time 1317    OT Stop Time 1400    OT Time Calculation (min) 43 min    Activity Tolerance Patient tolerated treatment well    Behavior During Therapy WFL for tasks assessed/performed             Past Medical History:  Diagnosis Date   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 No pertinent surgical history.  There were no vitals filed for this visit.   Subjective Assessment - 04/30/21 1319     Subjective  Pt reports some discomfort from sitting in the chair.    Currently in Pain? Yes    Pain Score 1     Pain Location Buttocks    Pain Orientation Left    Pain Descriptors / Indicators Sore    Pain Type Chronic pain    Pain Onset More than a month ago    Pain Frequency Constant                          OT Treatments/Exercises (OP) - 04/30/21 1404       ADLs   Eating worked on holding a bottle (water bottle) and bringing to mouth with RUE and LUE. Pt able to complete with RUE with mod difficulty and minimal spills/drops. Pt with much improvement with this task. Pt also simulated eating with 1 inch blocks but with max drops  on the way back down to table. Pt almost always was able to reach mouth before dropping block. Encouraged patient to try these tasks with finger foods.    Grooming pt reported shaving his face some with an electric shaver last night and working towards brushing his teeth      Neurological Re-education Exercises   Elbow Extension Theraband;Strengthening;Seated   x 10 reps with yellow theraband with therapist behind patient. sent for HEP - see pt instructions   Other Exercises 1 worked on seated exercises for BUE strengthening. Pt did unweighted dowel shoulder flexion x 10 but req'd min/mod A for maintaining grasp on dowel and for support with material.    Other Exercises 2 x 10 reps with 1 lb wrist weights BUE bicel curls, chest press, shoulder flexion                    OT Education - 04/30/21 1355     Education Details AROM shoulder flexion, bicep curls, chest press in chair with and without weight. Yellow theraband tricep dips    Person(s) Educated Patient    Methods Explanation;Demonstration    Comprehension Verbalized understanding;Returned demonstration;Need further instruction  OT Short Term Goals - 04/30/21 1415       OT SHORT TERM GOAL #1   Title Patient and caregiver will complete a home exercise program designed to improve strength in proximal UE's - shoulders and elbows    Time 4    Period Weeks    Status On-going   issued AROM exercises and gentle theraband tricep strengthening 04/30/21   Target Date 04/21/21      OT SHORT TERM GOAL #2   Title Following set up patient will hold a cup with lid and long straw in midline to allow him to take a sip on his own    Time 4    Period Weeks    Status On-going   completed with simulation in clinic on 04/30/21 - continue to assess for consistency     OT SHORT TERM GOAL #3   Title While seated in upright supported position, patient will reach forward to make contact with upright target on table top (chest  height) x 5 each arm    Time 4    Period Weeks    Status Achieved      OT SHORT TERM GOAL #4   Title Patient will be able to reach toward face with either right or left hand in preparation to scratch chin/cheek    Time 4    Period Weeks    Status Achieved      OT SHORT TERM GOAL #5   Title Patient will utilize cylindrical grip to hold small water bottle in one hand.    Time 4    Period Weeks    Status On-going   demonstrated holding bottle in RUE however not consistently 04/30/21              OT Long Term Goals - 04/25/21 1508       OT LONG TERM GOAL #1   Title Patient will complete an updated stretching program to prevent contracture in digits    Time 12    Period Weeks    Status On-going      OT LONG TERM GOAL #2   Title Patient and caregiver will demonstrate awareness of splint wear and care for BUE    Time 12    Period Weeks    Status On-going      OT LONG TERM GOAL #3   Title Patient will wash his face and upper body and upper legs with min assist    Time 12    Period Weeks    Status On-going      OT LONG TERM GOAL #4   Title Patient will actively bridge in bed to assist with pericare as needed    Time 12    Period Weeks    Status On-going      OT LONG TERM GOAL #5   Title Patient will transfer to adapted commode with max assist in prep for toileting    Time 12    Period Weeks    Status On-going      OT LONG TERM GOAL #6   Title Patient will transfer into/ out of shower with mod assist, and min assist for seated control during shower    Time 12    Period Weeks    Status On-going                   Plan - 04/30/21 1421     Clinical Impression Statement This note serves as 10th visits progress note  for period 03/22/21 - 04/30/21. Pt has made progress in all aspects with meeting 2/5 STGs and is progressing significantly towards remaining goals. Pt has demonstrated improved trunk strength and control with increased BUE strength and  coordination increasing independence with ADLs. Pt has benefitted from bilateral custom resting hand splints and has been compliant with wear and care instructions. Skilled occupational therapy is recommended to continue to address goals and work towards daytime splints for increasing functional use of BUE, education of adapted strategies and equipment and increasing strength and coordination for increasing independence.    OT Occupational Profile and History Comprehensive Assessment- Review of records and extensive additional review of physical, cognitive, psychosocial history related to current functional performance    Occupational performance deficits (Please refer to evaluation for details): ADL's;IADL's;Rest and Sleep;Leisure    Body Structure / Function / Physical Skills ADL;Decreased knowledge of use of DME;Strength;Balance;Dexterity;GMC;Pain;Tone;Body mechanics;Proprioception;UE functional use;Cardiopulmonary status limiting activity;Endurance;IADL;ROM;Fascial restriction;Improper spinal/pelvic alignment;Coordination;Flexibility;Mobility;Sensation;Wound;FMC;Muscle spasms;Skin integrity    Clinical Decision Making Multiple treatment options, significant modification of task necessary    Comorbidities Affecting Occupational Performance: Presence of comorbidities impacting occupational performance    Comorbidities impacting occupational performance description: AIDP, Chronic back pain, Descending thoracic aortic anneurysm    Modification or Assistance to Complete Evaluation  Min-Moderate modification of tasks or assist with assess necessary to complete eval    OT Frequency 3x / week    OT Duration 12 weeks    OT Treatment/Interventions Self-care/ADL training;Moist Heat;Fluidtherapy;DME and/or AE instruction;Splinting;Balance training;Aquatic Therapy;Contrast Bath;Therapeutic activities;Ultrasound;Therapeutic exercise;Passive range of motion;Functional Mobility Training;Neuromuscular  education;Electrical Stimulation;Manual Therapy;Patient/family education;Energy conservation    Plan maybe 2 wrist cock up/ or circumferential splints for carpal alignment/support and functional use, trunk control, bilateral hand use    Consulted and Agree with Plan of Care Patient;Family member/caregiver             Patient will benefit from skilled therapeutic intervention in order to improve the following deficits and impairments:   Body Structure / Function / Physical Skills: ADL, Decreased knowledge of use of DME, Strength, Balance, Dexterity, GMC, Pain, Tone, Body mechanics, Proprioception, UE functional use, Cardiopulmonary status limiting activity, Endurance, IADL, ROM, Fascial restriction, Improper spinal/pelvic alignment, Coordination, Flexibility, Mobility, Sensation, Wound, FMC, Muscle spasms, Skin integrity       Visit Diagnosis: Muscle weakness (generalized)  Unsteadiness on feet  Other abnormalities of gait and mobility  Stiffness of right hand, not elsewhere classified  Stiffness of left hand, not elsewhere classified  Other lack of coordination  Other disturbances of skin sensation    Problem List Patient Active Problem List   Diagnosis Date Noted   Acute on chronic respiratory failure with hypoxia (HCC)    Acute inflammatory demyelinating polyneuropathy (HCC)    Dysautonomia (HCC)    Atrial fibrillation with RVR [facility])    Healthcare associated bacterial pneumonia     Junious Dresser, OT 04/30/2021, 2:23 PM  [facility] [address] [place], [place], [address] Phone: [phone]   Fax:  [phone]  Name: Elmar Antigua MRN: [id number] Date of Birth: 1956/03/28

## 2021-04-30 NOTE — Patient Instructions (Addendum)
Without weight  bend both arms up at elbow towards shoulders. Repeat 10 times.  Hold arms out straight and raise up (like frankinstein) and back down. Repeat 10 times Bring hands into chest and push out. Repeat 10 times.   Tie look in band and wrap around your hand. Have assistant hold other end behind you while you're seated in chair. Extend arm out and push forward. Repeat 10 times.  Do again with other side.

## 2021-05-01 ENCOUNTER — Ambulatory Visit: Payer: Medicare Other

## 2021-05-01 ENCOUNTER — Encounter: Payer: Medicare Other | Admitting: Speech Pathology

## 2021-05-01 ENCOUNTER — Encounter: Payer: Medicare Other | Admitting: Occupational Therapy

## 2021-05-02 ENCOUNTER — Encounter: Payer: Self-pay | Admitting: Occupational Therapy

## 2021-05-02 ENCOUNTER — Ambulatory Visit: Payer: Medicare Other | Admitting: Occupational Therapy

## 2021-05-02 ENCOUNTER — Ambulatory Visit: Payer: Medicare Other

## 2021-05-02 ENCOUNTER — Other Ambulatory Visit: Payer: Self-pay

## 2021-05-02 VITALS — BP 130/70

## 2021-05-02 DIAGNOSIS — R2681 Unsteadiness on feet: Secondary | ICD-10-CM

## 2021-05-02 DIAGNOSIS — M6281 Muscle weakness (generalized): Secondary | ICD-10-CM

## 2021-05-02 DIAGNOSIS — M25641 Stiffness of right hand, not elsewhere classified: Secondary | ICD-10-CM

## 2021-05-02 DIAGNOSIS — R278 Other lack of coordination: Secondary | ICD-10-CM

## 2021-05-02 DIAGNOSIS — M25642 Stiffness of left hand, not elsewhere classified: Secondary | ICD-10-CM

## 2021-05-02 DIAGNOSIS — R208 Other disturbances of skin sensation: Secondary | ICD-10-CM

## 2021-05-02 DIAGNOSIS — R293 Abnormal posture: Secondary | ICD-10-CM

## 2021-05-02 NOTE — Therapy (Addendum)
[facility] [address] [place], [place], [address] Phone: [phone]   Fax:  [phone]  Physical Therapy Treatment  Patient Details  Name: Brian Espinoza MRN: [id number] Date of Birth: [birth year] Referring Provider (PT): Leandro Reasoner, MD   Encounter Date: 05/02/2021   PT End of Session - 05/02/21 1409     Visit Number 11    Number of Visits 25    Date for PT Re-Evaluation 05/18/21    Authorization Type Medicare A&B; BCBS - 10th visit PN    Progress Note Due on Visit 10    PT Start Time 1318    PT Stop Time 1403    PT Time Calculation (min) 45 min    Equipment Utilized During Treatment Gait belt    Activity Tolerance Patient tolerated treatment well    Behavior During Therapy WFL for tasks assessed/performed             Past Medical History:  Diagnosis Date   Acute inflammatory demyelinating polyneuropathy ([place])    Acute on chronic respiratory failure with hypoxia (HCC)    Atrial fibrillation with RVR ([place])    Dysautonomia ([place])    Healthcare associated bacterial pneumonia     History reviewed. No pertinent surgical history.  Vitals:   05/02/21 1424  BP: 130/70     Subjective Assessment - 05/02/21 1319     Subjective Pt reports he was sore after last visit and pt reports his upper body was sore after OT Monday.    Patient is accompained by: Family member    Pertinent History Chest pain, dissection of descending thoracic aorta, aneurysm of artery, a-fib, HTN, systolic CHF, oral phase dysphagia, sacral wound, PNA    Patient Stated Goals Improve hand function and walk to be more independent with daily activities.  Gain strength in legs to be able to lift hips and reduce shear on sacrum.    Currently in Pain? No/denies    Pain Onset More than a month ago                               [facility] Adult PT Treatment/Exercise - 05/02/21 1411       Transfers   Transfers Squat Pivot  Transfers;Sit to Stand    Sit to Stand --    Sit to Stand Details --    Squat Pivot Transfers 2: Max assist    Squat Pivot Transfer Details (indicate cue type and reason) Pt performed squat pivot with multiple scoots w/c to/from mat. Pt needed more assistance on return to w/c. Cued to lean forward and over therapist shoulder to put some weight through his legs. Therapist helped more with lateral movement. 2nd PT standing behind just for safety.    Lateral/Scoot Transfers --    Lateral/Scoot Transfer Details (indicate cue type and reason) --      Therapeutic Activites    Therapeutic Activities Other Therapeutic Activities    Other Therapeutic Activities Sitting on edge of mat w/ mirror in front pt perfromed x5 each BLE of marches w/ focus on going slow and controlled. Pt provided tactile cue to foot to assist pt w/ control of legs. Pt had increased difficutly w/ moving RLE vs LLE. Pt performed x5 each BLE stepping out to the side while sitting on edge of mat. Pt had increased difficutly w/ moving RLE. PT provided tactile cues to pt's foot to facilitate slow and controlled movement.  PT provided verbal cues for technique with breaking down the movement to lift first them move out. Pt performed stepping to targets (one in front and one diagonal) x5 each BLE. Pt required more min assist from PT to help guide R foot compared to L. Pt has drop foot bilateral. PT provided cues to pt to move slow and controlled. While sitting on edge of mat pt performed 2x5 each BLE LAQ w/ 3lb ankle weight and 5 second holds. PT provided min guard and provided verbal cues to maintian upright posture and to lower leg slowly especially with descent. Visual cues from mirror in front as well. Pt demonstrated increased difficulty w/ controlling RLE. At edge of mat pt performed x5 lateral weight shifts to forearm to blue wedge. PT cued pt to maintain weight forward and provided min guard to at front to avoid pt extensor tone. Pt  progressed to performing x5each side of lateral weight shift to forearm on to mat. PT verbally cued pt to maintain weight forward and provided min guard at front to block knees and prevent extensor tone.      Knee/Hip Exercises: Aerobic   Other Aerobic SciFit x 5 min level 2 with BLE (lowered to 1.5 last 2 min). Belt around legs to keep hips in neutral. Cues to keep step/min > 70.                       PT Short Term Goals - 04/18/21 1708       PT SHORT TERM GOAL #1   Title Pt and wife will demonstrate ability to perform initial supine and seated HEP    Baseline 04/18/21 HEP provided    Time 4    Period Weeks    Status On-going    Target Date 04/18/21      PT SHORT TERM GOAL #2   Title Pt will demonstrate ability to perform rolling and supine <> sit with moderate assistance    Baseline 04/18/21 Pt required min assist    Time 4    Period Weeks    Status Achieved    Target Date 04/18/21      PT SHORT TERM GOAL #3   Title Pt will demonstrate ability to perform slideboard on level surfaces w/c <> mat with 50% assistance from PT    Baseline total A in hoyer 04/18/21 Pt required 75% assistance    Time 4    Period Weeks    Status Not Met    Target Date 04/18/21      PT SHORT TERM GOAL #4   Title Pt will tolerate 5 minutes sustained, supported standing (standing frame or SARA) while performing weight shifting, LE activation training, etc    Time 4    Period Weeks    Status Not Met    Target Date 04/18/21               PT Long Term Goals - 03/19/21 2200       PT LONG TERM GOAL #1   Title Pt and wife will demonstrate independence with progressed HEP    Time 8    Period Weeks    Status New    Target Date 05/18/21      PT LONG TERM GOAL #2   Title Pt will demonstrate ability to perform rolling and supine <> sit with minimal assistance    Time 8    Period Weeks    Status New    Target  Date 05/18/21      PT LONG TERM GOAL #3   Title Pt will perform  slideboard transfers w/c <> mat on level surface Min A and will begin to demonstrate ability to perform squat-scooting across board    Time 8    Period Weeks    Status New    Target Date 05/18/21      PT LONG TERM GOAL #4   Title Pt will demonstrate ability to perform sit > stand from elevated surface with +2 assistance and UE support    Time 8    Period Weeks    Status New    Target Date 05/18/21                   Plan - 05/02/21 1425     Clinical Impression Statement Today's physical therapy session focused on improved BLE control and lateral weight shift while sitting on edge of mat. Pt presents w/ increased difficutly in moving RLE compared to the LLE needing increased PT assistance to facilitate controlled, graded movement. Breaking down the movement and using visual cues did help. PT will continue to progress pt as tolerated w/ POC towards achieving pt LTG.    Personal Factors and Comorbidities Comorbidity 3+;Past/Current Experience;Time since onset of injury/illness/exacerbation    Comorbidities Chest pain, dissection of descending thoracic aorta, aneurysm of artery, a-fib, HTN, systolic CHF, oral phase dysphagia, sacral wound, PNA    Examination-Activity Limitations Bed Mobility;Bend;Locomotion Level;Sit;Stairs;Stand;Transfers;Lift;Hygiene/Grooming;Dressing;Toileting;Self Feeding;Reach Overhead;Bathing    Examination-Participation Restrictions Community Activity;Driving;Yard Work    [employer] Evolving/Moderate complexity    Rehab Potential Good    PT Frequency 2x / week    PT Duration 8 weeks    PT Treatment/Interventions ADLs/Self Care Home Management;DME Instruction;Gait training;Stair training;Functional mobility training;Therapeutic activities;Therapeutic exercise;Balance training;Neuromuscular re-education;Wheelchair mobility training;Orthotic Fit/Training;Patient/family education;Energy conservation    PT Next Visit Plan Pt needs to be  scheduled out further as visits run out next week!! MONITOR BP: Keep SBP between 120-130! SCI Fit in wheelchair with LE only and belt around thighs.  Continue to work on squat scooting transfers without slideboard.  LE strengthening.  Sit <> stand from elevated mat with 2 person assist to work on graded movement when coming back to sit as well or SARA - gait belt around knees to prevent hip ABD/ER    Consulted and Agree with Plan of Care Patient             Patient will benefit from skilled therapeutic intervention in order to improve the following deficits and impairments:  Cardiopulmonary status limiting activity, Decreased activity tolerance, Decreased balance, Decreased coordination, Decreased endurance, Decreased mobility, Decreased strength, Difficulty walking, Impaired sensation, Impaired tone, Impaired UE functional use, Postural dysfunction, Pain, Abnormal gait  Visit Diagnosis: Unsteadiness on feet  Muscle weakness (generalized)     Problem List Patient Active Problem List   Diagnosis Date Noted   Acute on chronic respiratory failure with hypoxia (HCC)    Acute inflammatory demyelinating polyneuropathy ([place])    Dysautonomia ([place])    Atrial fibrillation with RVR [facility])    Healthcare associated bacterial pneumonia     Lottie Mussel, Student-PT 05/03/2021, 2:35 PM  [facility] [address] [place], [place], [address] Phone: [phone]   Fax:  [phone]  Name: Brian Espinoza MRN: [id number] Date of Birth: 08-20-1955

## 2021-05-02 NOTE — Therapy (Signed)
[facility] [address] [place], [place], [address] Phone: [phone]   Fax:  [phone]  Occupational Therapy Treatment  Patient Details  Name: Brian Espinoza MRN: [id number] Date of Birth: 1955/06/25 Referring Provider (OT): Shirlee Latch (Will send certification to PCP Venia Minks [facility])   Encounter Date: 05/02/2021   OT End of Session - 05/02/21 1639     Visit Number 11    Number of Visits 37    Date for OT Re-Evaluation 06/20/21    Authorization Type Medicare and Federal BCBS    Progress Note Due on Visit 20    OT Start Time 1230    OT Stop Time 1315    OT Time Calculation (min) 45 min    Activity Tolerance Patient tolerated treatment well    Behavior During Therapy WFL for tasks assessed/performed             Past Medical History:  Diagnosis Date   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 No pertinent surgical history.  There were no vitals filed for this visit.   Subjective Assessment - 05/02/21 1635     Subjective  Patient reports he felt like he accomplished something last week in PT when he stood with assistance    Currently in Pain? No/denies    Pain Score 0-No pain                          OT Treatments/Exercises (OP) - 05/02/21 1636       Splinting   Splinting Fabricated bilateral wrist splints to help promote more functional position of thumb, and improved arch to hand, and to support wrist in slightly extended position.  Splint not yet completed, and in final fitting prior to edging, splint edge punctured patient's forearm, patient bleeding.  Wound cleaned with water and bandage applied.  Splints may be finished next session and will be helpful to train  finger extension without wrist flexion.                      OT Short Term Goals - 05/02/21 1641       OT SHORT TERM GOAL #1   Title Patient and caregiver will complete a home exercise program designed to improve strength in proximal UE's - shoulders and elbows    Time 4    Period Weeks    Status On-going   issued AROM exercises and gentle theraband tricep strengthening 04/30/21   Target Date 04/21/21      OT SHORT TERM GOAL #2   Title Following set up patient will hold a cup with lid and long straw in midline to allow him to take a sip on his own    Time 4    Period Weeks    Status On-going   completed with simulation in clinic on 04/30/21 - continue to assess for consistency     OT SHORT TERM GOAL #3   Title While seated in upright supported position, patient will reach forward to make contact with upright target on table top (chest height) x 5 each arm    Time 4    Period Weeks    Status Achieved      OT  SHORT TERM GOAL #4   Title Patient will be able to reach toward face with either right or left hand in preparation to scratch chin/cheek    Time 4    Period Weeks    Status Achieved      OT SHORT TERM GOAL #5   Title Patient will utilize cylindrical grip to hold small water bottle in one hand.    Time 4    Period Weeks    Status On-going   demonstrated holding bottle in RUE however not consistently 04/30/21              OT Long Term Goals - 05/02/21 1641       OT LONG TERM GOAL #1   Title Patient will complete an updated stretching program to prevent contracture in digits    Time 12    Period Weeks    Status On-going      OT LONG TERM GOAL #2   Title Patient and caregiver will demonstrate awareness of splint wear and care for BUE    Time 12    Period Weeks    Status On-going      OT LONG TERM GOAL #3   Title Patient will wash his face and upper body and upper legs with min assist    Time 12    Period Weeks    Status On-going      OT LONG  TERM GOAL #4   Title Patient will actively bridge in bed to assist with pericare as needed    Time 12    Period Weeks    Status On-going      OT LONG TERM GOAL #5   Title Patient will transfer to adapted commode with max assist in prep for toileting    Time 12    Period Weeks    Status On-going      OT LONG TERM GOAL #6   Title Patient will transfer into/ out of shower with mod assist, and min assist for seated control during shower    Time 12    Period Weeks    Status On-going                   Plan - 05/02/21 1640     Clinical Impression Statement Patient reports improved hand mobility since start of resting hand splints.  Patient reports that he has started attempting to feed himself finger foods.    OT Occupational Profile and History Comprehensive Assessment- Review of records and extensive additional review of physical, cognitive, psychosocial history related to current functional performance    Occupational performance deficits (Please refer to evaluation for details): ADL's;IADL's;Rest and Sleep;Leisure    Body Structure / Function / Physical Skills ADL;Decreased knowledge of use of DME;Strength;Balance;Dexterity;GMC;Pain;Tone;Body mechanics;Proprioception;UE functional use;Cardiopulmonary status limiting activity;Endurance;IADL;ROM;Fascial restriction;Improper spinal/pelvic alignment;Coordination;Flexibility;Mobility;Sensation;Wound;FMC;Muscle spasms;Skin integrity    Clinical Decision Making Multiple treatment options, significant modification of task necessary    Comorbidities Affecting Occupational Performance: Presence of comorbidities impacting occupational performance    Comorbidities impacting occupational performance description: AIDP, Chronic back pain, Descending thoracic aortic anneurysm    Modification or Assistance to Complete Evaluation  Min-Moderate modification of tasks or assist with assess necessary to complete eval    OT Frequency 3x / week    OT  Duration 12 weeks    OT Treatment/Interventions Self-care/ADL training;Moist Heat;Fluidtherapy;DME and/or AE instruction;Splinting;Balance training;Aquatic Therapy;Contrast Bath;Therapeutic activities;Ultrasound;Therapeutic exercise;Passive range of motion;Functional Mobility Training;Neuromuscular education;Electrical Stimulation;Manual Therapy;Patient/family education;Energy conservation    Plan finish splints  for carpal alignment/support and work on finger extension in splints - functional use, trunk control, bilateral hand use    Consulted and Agree with Plan of Care Patient;Family member/caregiver             Patient will benefit from skilled therapeutic intervention in order to improve the following deficits and impairments:   Body Structure / Function / Physical Skills: ADL, Decreased knowledge of use of DME, Strength, Balance, Dexterity, GMC, Pain, Tone, Body mechanics, Proprioception, UE functional use, Cardiopulmonary status limiting activity, Endurance, IADL, ROM, Fascial restriction, Improper spinal/pelvic alignment, Coordination, Flexibility, Mobility, Sensation, Wound, FMC, Muscle spasms, Skin integrity       Visit Diagnosis: Stiffness of right hand, not elsewhere classified  Stiffness of left hand, not elsewhere classified  Other lack of coordination  Other disturbances of skin sensation  Abnormal posture  Muscle weakness (generalized)  Unsteadiness on feet    Problem List Patient Active Problem List   Diagnosis Date Noted   Acute on chronic respiratory failure with hypoxia (HCC)    Acute inflammatory demyelinating polyneuropathy ([place])    Dysautonomia ([place])    Atrial fibrillation with RVR [facility])    Healthcare associated bacterial pneumonia     Mariah Milling, OT 05/02/2021, 4:42 PM  [facility] [address] [place], [place], [address] Phone: [phone]   Fax:  [phone]  Name: Brian Espinoza MRN: [id number] Date of Birth: 02-13-1956

## 2021-05-03 ENCOUNTER — Ambulatory Visit: Payer: Medicare Other | Admitting: Physical Therapy

## 2021-05-03 ENCOUNTER — Encounter: Payer: Medicare Other | Admitting: Occupational Therapy

## 2021-05-04 ENCOUNTER — Ambulatory Visit: Payer: Medicare Other | Admitting: Physical Therapy

## 2021-05-04 ENCOUNTER — Ambulatory Visit: Payer: Medicare Other | Admitting: Occupational Therapy

## 2021-05-04 ENCOUNTER — Other Ambulatory Visit: Payer: Self-pay

## 2021-05-04 VITALS — BP 122/66 | HR 59

## 2021-05-04 DIAGNOSIS — R2681 Unsteadiness on feet: Secondary | ICD-10-CM

## 2021-05-04 DIAGNOSIS — M6281 Muscle weakness (generalized): Secondary | ICD-10-CM

## 2021-05-04 DIAGNOSIS — M25641 Stiffness of right hand, not elsewhere classified: Secondary | ICD-10-CM

## 2021-05-04 DIAGNOSIS — R208 Other disturbances of skin sensation: Secondary | ICD-10-CM

## 2021-05-04 DIAGNOSIS — R278 Other lack of coordination: Secondary | ICD-10-CM

## 2021-05-04 DIAGNOSIS — M25642 Stiffness of left hand, not elsewhere classified: Secondary | ICD-10-CM

## 2021-05-04 DIAGNOSIS — R2689 Other abnormalities of gait and mobility: Secondary | ICD-10-CM

## 2021-05-04 DIAGNOSIS — R295 Transient paralysis: Secondary | ICD-10-CM

## 2021-05-04 DIAGNOSIS — R293 Abnormal posture: Secondary | ICD-10-CM

## 2021-05-04 NOTE — Therapy (Signed)
[facility] [address] [place], [place], [address] Phone: [phone]   Fax:  [phone]  Occupational Therapy Treatment  Patient Details  Name: Brian Espinoza MRN: [id number] Date of Birth: May 24, 1956 Referring Provider (OT): Shirlee Latch (Will send certification to PCP Venia Minks [facility])   Encounter Date: 05/04/2021   OT End of Session - 05/04/21 1340     Visit Number 12    Number of Visits 37    Date for OT Re-Evaluation 06/20/21    Authorization Type Medicare and Federal BCBS    Progress Note Due on Visit 20    OT Start Time 1231    OT Stop Time 1315    OT Time Calculation (min) 44 min    Activity Tolerance Patient tolerated treatment well    Behavior During Therapy WFL for tasks assessed/performed             Past Medical History:  Diagnosis Date   Acute inflammatory demyelinating polyneuropathy (HCC)    Acute on chronic respiratory failure with hypoxia (HCC)    Atrial fibrillation with RVR (HCC)    Dysautonomia (HCC)    Healthcare associated bacterial pneumonia     No past surgical history on file.  There were no vitals filed for this visit.   Subjective Assessment - 05/04/21 1325     Subjective  Pt arrived with [facility] transportation and needed assistance for getting safely in chair as he had slid forward some on van. Assisted to therapy with OT.    Currently in Pain? No/denies    Pain Score 0-No pain    Pain Location Buttocks               Modifications made to circumferential splints from previous session.   Made adjustments to seating for hip and trunk alignment and adapted with lateral support (velcro strip) at thighs to assist with more upright sitting position and control  Grasp and release of 1 inch blocks with good wrist alignment and with ability to use lateral pinch and whole hand grasp with mod difficulty BUE.                      OT  Short Term Goals - 05/02/21 1641       OT SHORT TERM GOAL #1   Title Patient and caregiver will complete a home exercise program designed to improve strength in proximal UE's - shoulders and elbows    Time 4    Period Weeks    Status On-going   issued AROM exercises and gentle theraband tricep strengthening 04/30/21   Target Date 04/21/21      OT SHORT TERM GOAL #2   Title Following set up patient will hold a cup with lid and long straw in midline to allow him to take a sip on his own    Time 4    Period Weeks    Status On-going   completed with simulation in clinic on 04/30/21 - continue to assess for consistency     OT SHORT TERM GOAL #3   Title While seated in upright supported position, patient will reach forward to make contact with upright target on table top (chest height) x 5 each arm    Time 4    Period Weeks    Status Achieved      OT SHORT TERM GOAL #4   Title Patient will be able to reach toward face with either right  or left hand in preparation to scratch chin/cheek    Time 4    Period Weeks    Status Achieved      OT SHORT TERM GOAL #5   Title Patient will utilize cylindrical grip to hold small water bottle in one hand.    Time 4    Period Weeks    Status On-going   demonstrated holding bottle in RUE however not consistently 04/30/21              OT Long Term Goals - 05/02/21 1641       OT LONG TERM GOAL #1   Title Patient will complete an updated stretching program to prevent contracture in digits    Time 12    Period Weeks    Status On-going      OT LONG TERM GOAL #2   Title Patient and caregiver will demonstrate awareness of splint wear and care for BUE    Time 12    Period Weeks    Status On-going      OT LONG TERM GOAL #3   Title Patient will wash his face and upper body and upper legs with min assist    Time 12    Period Weeks    Status On-going      OT LONG TERM GOAL #4   Title Patient will actively bridge in bed to assist with pericare  as needed    Time 12    Period Weeks    Status On-going      OT LONG TERM GOAL #5   Title Patient will transfer to adapted commode with max assist in prep for toileting    Time 12    Period Weeks    Status On-going      OT LONG TERM GOAL #6   Title Patient will transfer into/ out of shower with mod assist, and min assist for seated control during shower    Time 12    Period Weeks    Status On-going                   Plan - 05/04/21 1425     Clinical Impression Statement Pt req'd assistance for seating and positioning d/t safety concerns today. Continue to assess hip alignment and leg alignment. modifications made to circumferential splints and kept in clinic for use for coordination with BUE.    OT Occupational Profile and History Comprehensive Assessment- Review of records and extensive additional review of physical, cognitive, psychosocial history related to current functional performance    Occupational performance deficits (Please refer to evaluation for details): ADL's;IADL's;Rest and Sleep;Leisure    Body Structure / Function / Physical Skills ADL;Decreased knowledge of use of DME;Strength;Balance;Dexterity;GMC;Pain;Tone;Body mechanics;Proprioception;UE functional use;Cardiopulmonary status limiting activity;Endurance;IADL;ROM;Fascial restriction;Improper spinal/pelvic alignment;Coordination;Flexibility;Mobility;Sensation;Wound;FMC;Muscle spasms;Skin integrity    Clinical Decision Making Multiple treatment options, significant modification of task necessary    Comorbidities Affecting Occupational Performance: Presence of comorbidities impacting occupational performance    Comorbidities impacting occupational performance description: AIDP, Chronic back pain, Descending thoracic aortic anneurysm    Modification or Assistance to Complete Evaluation  Min-Moderate modification of tasks or assist with assess necessary to complete eval    OT Frequency 3x / week    OT Duration 12  weeks    OT Treatment/Interventions Self-care/ADL training;Moist Heat;Fluidtherapy;DME and/or AE instruction;Splinting;Balance training;Aquatic Therapy;Contrast Bath;Therapeutic activities;Ultrasound;Therapeutic exercise;Passive range of motion;Functional Mobility Training;Neuromuscular education;Electrical Stimulation;Manual Therapy;Patient/family education;Energy conservation    Plan make adjustments to splint PRN for carpal alignment/support and work  on finger extension in splints - functional use, trunk control, bilateral hand use, check positioning in tilt in space for safety    Consulted and Agree with Plan of Care Patient;Family member/caregiver             Patient will benefit from skilled therapeutic intervention in order to improve the following deficits and impairments:   Body Structure / Function / Physical Skills: ADL, Decreased knowledge of use of DME, Strength, Balance, Dexterity, GMC, Pain, Tone, Body mechanics, Proprioception, UE functional use, Cardiopulmonary status limiting activity, Endurance, IADL, ROM, Fascial restriction, Improper spinal/pelvic alignment, Coordination, Flexibility, Mobility, Sensation, Wound, FMC, Muscle spasms, Skin integrity       Visit Diagnosis: Unsteadiness on feet  Stiffness of right hand, not elsewhere classified  Muscle weakness (generalized)  Stiffness of left hand, not elsewhere classified  Other lack of coordination  Other disturbances of skin sensation  Abnormal posture    Problem List Patient Active Problem List   Diagnosis Date Noted   Acute on chronic respiratory failure with hypoxia (HCC)    Acute inflammatory demyelinating polyneuropathy (HCC)    Dysautonomia (HCC)    Atrial fibrillation with RVR [facility])    Healthcare associated bacterial pneumonia     Junious Dresser, OT 05/04/2021, 2:29 PM  [facility] [address] [place], [place],  [address] Phone: [phone]   Fax:  [phone]  Name: Werner Labella MRN: [id number] Date of Birth: [birth year]

## 2021-05-06 NOTE — Therapy (Signed)
[facility] [address] [place], [place], [address] Phone: [phone]   Fax:  [phone]  Physical Therapy Treatment  Patient Details  Name: Brian Espinoza MRN: [id number] Date of Birth: 1955-11-22 Referring Provider (PT): Leandro Reasoner, MD   Encounter Date: 05/04/2021   PT End of Session - 05/06/21 1452     Visit Number 12    Number of Visits 25    Date for PT Re-Evaluation 05/18/21    Authorization Type Medicare A&B; BCBS - 10th visit PN    Progress Note Due on Visit 20    PT Start Time 1315    PT Stop Time 1400    PT Time Calculation (min) 45 min    Activity Tolerance Patient tolerated treatment well    Behavior During Therapy [facility] for tasks assessed/performed             Past Medical History:  Diagnosis Date   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  Vitals:   05/04/21 1325  BP: 122/66  Pulse: (!) 59      05/04/21 1321  Symptoms/Limitations  Subjective LE kicking off foot plates and pt's hips sliding forwards in the chair today.  OT placed velcro strap around LE to maintain position and belt to prevent sliding.  "I can look around and tell I'm in last place."  Patient is accompained by: Family member  Pertinent History Chest pain, dissection of descending thoracic aorta, aneurysm of artery, a-fib, HTN, systolic CHF, oral phase dysphagia, sacral wound, PNA  Patient Stated Goals Improve hand function and walk to be more independent with daily activities.  Gain strength in legs to be able to lift hips and reduce shear on sacrum.  Pain Assessment  Currently in Pain? No/denies  Pain Onset More than a month ago    First part of session spent discussing patient's feelings of  discouragement.  Patient comparing his progress and condition to other patient's in the treatment gym.  Patient stating again that he had no idea recovering would take this long.  Patient stating, "this is no life."  Pt tearful today.  Pt asking, "honestly, do you think I will fully recover?"  PT acknowledged patient's feelings of sadness and frustration and educated pt that these feelings can be normal after illness and prolonged hospitalization.  Asked pt if he would like to speak with a mental health professional.  Pt stated, "No, that's not important right now."  Educated pt on why mental health is just as important as physical health and how it supports physical health.  Pt continued to deny need for mental health professional.  PT offered to provide pt with resources if he wished to in the future.  Discussed complexity of his medical situation and prolonged amount of time in the hospital and why progress is slow/gradual.  PT unable to give definitive answer about "full recovery"; due to multiple factors unable to determine final outcome of patient's rehabilitation.  Discussed multiple factors patient has in his favor for good outcome including: physical progress each week, support system, ability to tolerate multiple sessions of therapy each week, etc.  Discussed why he can't compare himself to anyone else but can focus on comparing his own progress and functional abilities week to  week.  Pt verbalized understanding but wished to start with mobility portion of session.   [facility] Adult PT Treatment/Exercise - 05/06/21 1441       Transfers   Transfers Sit to Stand;Stand to Sit;Lateral/Scoot Transfers    Sit to Stand 2: Max assist    Sit to Stand Details (indicate cue type and reason) multiple attempts from elevated mat without UE support but with max A of therapist in front of patient assisting with forward weight shift and maintaining forward weight shift to allow pt to initiate sit > stand; also  provided assistance to maintain balance once standing and for controlled lowering back down to mat    Stand to Sit 2: Max assist    Stand to Sit Details for controlled lowering down to mat    Lateral/Scoot Transfers 2: Max assist    Lateral/Scoot Transfer Details (indicate cue type and reason) tilt in space wheelchair <> mat with therapist in front of patient assisting with anterior lean/weight shift forwards and maintaining weight shift over BOS while pt initiated lifting of hips to perform squat and then pivot    Transfer Cueing Once on mat performed multiple sit > squat with therapist in front assisting with foot placement and providing cues for anterior lean and weight shift forwards and to the R to pivot hips to L, or lean to the L to pivot hips to the R.  Performed multiple times on mat      Exercises   Exercises Knee/Hip      Knee/Hip Exercises: Aerobic   Other Aerobic SciFit x 5 min level 2 with BLE only.  Belt around legs to keep hips in neutral.      Knee/Hip Exercises: Seated   Long Arc Quad Strengthening;Right;Left;1 set;10 reps    [employer] Limitations focusing on controlled extension in open chain followed by hamstring curls against resistance    Hamstring Curl Strengthening;Right;Left;1 set;10 reps;Limitations    Hamstring Limitations Use of green theraband for resistance when curling and to facilitate grading of movement and provided pt with visual cue for controlled placement of foot on floor.    Abduction/Adduction  Strengthening;Right;Left;1 set;10 reps;Limitations    Abd/Adduction Limitations Green theraband around LE performed LE lift with ABD out to the side against resistance and then returning to midline with control again focusing on grading of movement and controlled foot placement              PT Short Term Goals - 04/18/21 1708       PT SHORT TERM GOAL #1   Title Pt and wife will demonstrate ability to perform initial supine and seated HEP    Baseline  04/18/21 HEP provided    Time 4    Period Weeks    Status On-going    Target Date 04/18/21      PT SHORT TERM GOAL #2   Title Pt will demonstrate ability to perform rolling and supine <> sit with moderate assistance    Baseline 04/18/21 Pt required min assist    Time 4    Period Weeks    Status Achieved    Target Date 04/18/21      PT SHORT TERM GOAL #3   Title Pt will demonstrate ability to perform slideboard on level surfaces w/c <> mat with 50% assistance from PT    Baseline total A in hoyer 04/18/21 Pt required 75% assistance    Time 4    Period Weeks    Status Not  Met    Target Date 04/18/21      PT SHORT TERM GOAL #4   Title Pt will tolerate 5 minutes sustained, supported standing (standing frame or SARA) while performing weight shifting, LE activation training, etc    Time 4    Period Weeks    Status Not Met    Target Date 04/18/21               PT Long Term Goals - 03/19/21 2200       PT LONG TERM GOAL #1   Title Pt and wife will demonstrate independence with progressed HEP    Time 8    Period Weeks    Status New    Target Date 05/18/21      PT LONG TERM GOAL #2   Title Pt will demonstrate ability to perform rolling and supine <> sit with minimal assistance    Time 8    Period Weeks    Status New    Target Date 05/18/21      PT LONG TERM GOAL #3   Title Pt will perform slideboard transfers w/c <> mat on level surface Min A and will begin to demonstrate ability to perform squat-scooting across board    Time 8    Period Weeks    Status New    Target Date 05/18/21      PT LONG TERM GOAL #4   Title Pt will demonstrate ability to perform sit > stand from elevated surface with +2 assistance and UE support    Time 8    Period Weeks    Status New    Target Date 05/18/21              Plan - 05/06/21 1453     Clinical Impression Statement Continued to focus on progression of transfers with pt performing squat scooting w/c <> mat with one person  assist and was able to perform multiple controlled sit > squats and sit > stands with one person assist today.  Utilized resistance to continue to work on grading of LE movement due to impaired sensation.  Pt more discouraged today about slow progress but denies need for referral to mental health professional; PT to continue to monitor and provide resources and encouragement.    Personal Factors and Comorbidities Comorbidity 3+;Past/Current Experience;Time since onset of injury/illness/exacerbation    Comorbidities Chest pain, dissection of descending thoracic aorta, aneurysm of artery, a-fib, HTN, systolic CHF, oral phase dysphagia, sacral wound, PNA    Examination-Activity Limitations Bed Mobility;Bend;Locomotion Level;Sit;Stairs;Stand;Transfers;Lift;Hygiene/Grooming;Dressing;Toileting;Self Feeding;Reach Overhead;Bathing    Examination-Participation Restrictions Community Activity;Driving;Yard Work    Stability/Clinical Decision Making Evolving/Moderate complexity    Rehab Potential Good    PT Frequency 3x / week   updated in November; new cert sent with frequency updated   PT Duration 8 weeks    PT Treatment/Interventions ADLs/Self Care Home Management;DME Instruction;Gait training;Stair training;Functional mobility training;Therapeutic activities;Therapeutic exercise;Balance training;Neuromuscular re-education;Wheelchair mobility training;Orthotic Fit/Training;Patient/family education;Energy conservation    PT Next Visit Plan Pt needs to be scheduled out further as visits run out next week!! MONITOR BP: Keep SBP between 120-130! SCI Fit in wheelchair with LE only and belt around thighs.  Continue to work on squat scooting transfers without slideboard.  LE strengthening.  Sit <> stand from elevated mat with 2 person assist to work on graded movement when coming back to sit as well or SARA - gait belt around knees to prevent hip ABD/ER    Consulted and Agree with  Plan of Care Patient              Patient will benefit from skilled therapeutic intervention in order to improve the following deficits and impairments:  Cardiopulmonary status limiting activity, Decreased activity tolerance, Decreased balance, Decreased coordination, Decreased endurance, Decreased mobility, Decreased strength, Difficulty walking, Impaired sensation, Impaired tone, Impaired UE functional use, Postural dysfunction, Pain, Abnormal gait  Visit Diagnosis: Unsteadiness on feet  Muscle weakness (generalized)  Other disturbances of skin sensation  Other abnormalities of gait and mobility  Transient paralysis     Problem List Patient Active Problem List   Diagnosis Date Noted   Acute on chronic respiratory failure with hypoxia (HCC)    Acute inflammatory demyelinating polyneuropathy (HCC)    Dysautonomia (HCC)    Atrial fibrillation with RVR ([place])    Healthcare associated bacterial pneumonia     Rico Junker, PT, DPT 05/06/21    3:03 PM    [facility] [address] [place], [place], [address] Phone: [phone]   Fax:  [phone]  Name: Joy Haegele MRN: [id number] Date of Birth: 03/20/1956

## 2021-05-07 ENCOUNTER — Ambulatory Visit: Payer: Medicare Other | Admitting: Physical Therapy

## 2021-05-07 ENCOUNTER — Other Ambulatory Visit: Payer: Self-pay

## 2021-05-07 ENCOUNTER — Encounter: Payer: Self-pay | Admitting: Occupational Therapy

## 2021-05-07 ENCOUNTER — Ambulatory Visit: Payer: Medicare Other | Admitting: Occupational Therapy

## 2021-05-07 VITALS — BP 104/61 | HR 59

## 2021-05-07 DIAGNOSIS — R2681 Unsteadiness on feet: Secondary | ICD-10-CM | POA: Diagnosis not present

## 2021-05-07 DIAGNOSIS — M6281 Muscle weakness (generalized): Secondary | ICD-10-CM

## 2021-05-07 DIAGNOSIS — R2689 Other abnormalities of gait and mobility: Secondary | ICD-10-CM

## 2021-05-07 DIAGNOSIS — R208 Other disturbances of skin sensation: Secondary | ICD-10-CM

## 2021-05-07 DIAGNOSIS — R278 Other lack of coordination: Secondary | ICD-10-CM

## 2021-05-07 DIAGNOSIS — M25641 Stiffness of right hand, not elsewhere classified: Secondary | ICD-10-CM

## 2021-05-07 DIAGNOSIS — R295 Transient paralysis: Secondary | ICD-10-CM

## 2021-05-07 NOTE — Therapy (Signed)
[facility] [address] [place], [place], [address] Phone: [phone]   Fax:  [phone]  Physical Therapy Treatment  Patient Details  Name: Brian Espinoza MRN: [id number] Date of Birth: 1955/10/26 Referring Provider (PT): Leandro Reasoner, MD   Encounter Date: 05/07/2021   PT End of Session - 05/07/21 1256     Visit Number 13    Number of Visits 25    Date for PT Re-Evaluation 05/18/21    Authorization Type Medicare A&B; BCBS - 10th visit PN    Progress Note Due on Visit 20    PT Start Time 1150    PT Stop Time 1235    PT Time Calculation (min) 45 min    Activity Tolerance Patient tolerated treatment well    Behavior During Therapy WFL for tasks assessed/performed             Past Medical History:  Diagnosis Date   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  Vitals:   05/07/21 1201  BP: 104/61  Pulse: (!) 59     Subjective Assessment - 05/07/21 1156     Subjective Needs more visits for PT and OT.  Would like to continue with 3x/week.  Abdomen is sore after therapy.  Still having constant numbness in LE but moving them constantly.    Patient is accompained by: Family member    Pertinent History Chest pain, dissection of descending thoracic aorta, aneurysm of artery, a-fib, HTN, systolic CHF, oral phase dysphagia, sacral wound, PNA    Patient Stated Goals Improve hand function and walk to be more independent with daily activities.  Gain strength in legs to be able to lift hips and reduce shear on sacrum.    Currently in Pain? No/denies    Pain Onset More than a month ago               [facility] Adult PT Treatment/Exercise - 05/07/21 1248       Transfers   Transfers Sit to Stand;Stand to  Sit;Lateral/Scoot Transfers    Sit to Stand 1: +2 Total assist;1: +1 Total assist    Sit to Stand Details (indicate cue type and reason) from elevated mat attempted to perform multiple sit > stand with UE supported on UP Walker but pt unable to maintain placement of UE. Each time pt attempted to stand UE would fall off the side of the platform and then pt unable to complete the sit >stand even with +2 assistance.  Completed final sit > stand with total A of standing frame and harness with UE supported on table.    Stand to Sit 1: +1 Total assist    Stand to Sit Details more uncontrolled descent today when lowering from standing frame.    Lateral/Scoot Transfers 2: [employer] transport manager Details (indicate cue type and reason) w/c <> mat with mod A for scooting to R to mat with pt demonstrating improved initiation of sit > squat; continued to require assistance to maintain weight shift forwards while pivoting to R.  Increased assistance required when returning to w/c to L due to LE fatigue.    Comments Once in standing in standing frame with UE support on table and harness under pelvis performed prolonged  standing with able to fully extend LE and maintain.  Performed lateral weight shifting with mod facilitation to initiate weight shift through pelvis.  Attempted to perform controlled standing > supported squat in harness; multiple attempts but pt unable to activate eccentric contraction to control knee flexion and hip flexion.               PT Education - 05/07/21 1256     Education Details adding more PT visits    Person(s) Educated Patient    Methods Explanation    Comprehension Verbalized understanding              PT Short Term Goals - 04/18/21 1708       PT SHORT TERM GOAL #1   Title Pt and wife will demonstrate ability to perform initial supine and seated HEP    Baseline 04/18/21 HEP provided    Time 4    Period Weeks    Status On-going    Target Date 04/18/21       PT SHORT TERM GOAL #2   Title Pt will demonstrate ability to perform rolling and supine <> sit with moderate assistance    Baseline 04/18/21 Pt required min assist    Time 4    Period Weeks    Status Achieved    Target Date 04/18/21      PT SHORT TERM GOAL #3   Title Pt will demonstrate ability to perform slideboard on level surfaces w/c <> mat with 50% assistance from PT    Baseline total A in hoyer 04/18/21 Pt required 75% assistance    Time 4    Period Weeks    Status Not Met    Target Date 04/18/21      PT SHORT TERM GOAL #4   Title Pt will tolerate 5 minutes sustained, supported standing (standing frame or SARA) while performing weight shifting, LE activation training, etc    Time 4    Period Weeks    Status Not Met    Target Date 04/18/21               PT Long Term Goals - 03/19/21 2200       PT LONG TERM GOAL #1   Title Pt and wife will demonstrate independence with progressed HEP    Time 8    Period Weeks    Status New    Target Date 05/18/21      PT LONG TERM GOAL #2   Title Pt will demonstrate ability to perform rolling and supine <> sit with minimal assistance    Time 8    Period Weeks    Status New    Target Date 05/18/21      PT LONG TERM GOAL #3   Title Pt will perform slideboard transfers w/c <> mat on level surface Min A and will begin to demonstrate ability to perform squat-scooting across board    Time 8    Period Weeks    Status New    Target Date 05/18/21      PT LONG TERM GOAL #4   Title Pt will demonstrate ability to perform sit > stand from elevated surface with +2 assistance and UE support    Time 8    Period Weeks    Status New    Target Date 05/18/21                   Plan - 05/07/21 1300     Clinical  Impression Statement Pt demonstrated improved initiation and activation of LE extensors for squat scooting and demonstrated improved control of LE when lifting and repositioning feet.  Trialed sit > stand with  platform UP Walker but still unable to control or maintain placement of UE.  Will continue to address and progress towards LTG.    Personal Factors and Comorbidities Comorbidity 3+;Past/Current Experience;Time since onset of injury/illness/exacerbation    Comorbidities Chest pain, dissection of descending thoracic aorta, aneurysm of artery, a-fib, HTN, systolic CHF, oral phase dysphagia, sacral wound, PNA    Examination-Activity Limitations Bed Mobility;Bend;Locomotion Level;Sit;Stairs;Stand;Transfers;Lift;Hygiene/Grooming;Dressing;Toileting;Self Feeding;Reach Overhead;Bathing    Examination-Participation Restrictions Community Activity;Driving;Yard Work    Stability/Clinical Decision Making Evolving/Moderate complexity    Rehab Potential Good    PT Frequency 3x / week   updated in November; new cert sent with frequency updated   PT Duration 8 weeks    PT Treatment/Interventions ADLs/Self Care Home Management;DME Instruction;Gait training;Stair training;Functional mobility training;Therapeutic activities;Therapeutic exercise;Balance training;Neuromuscular re-education;Wheelchair mobility training;Orthotic Fit/Training;Patient/family education;Energy conservation    PT Next Visit Plan MONITOR BP: Keep SBP between 120-130! SCI Fit in wheelchair with LE only and belt around thighs.  Continue to work on squat scooting transfers without slideboard.  Working towards sit > stand with UE support on platform walker/UpWalker.  Closed chain LE strengthening/WB.  Sit <> stand from elevated mat with 2 person assist to work on graded movement when coming back to sit as well or SARA - gait belt around knees to prevent hip ABD/ER    Consulted and Agree with Plan of Care Patient             Patient will benefit from skilled therapeutic intervention in order to improve the following deficits and impairments:  Cardiopulmonary status limiting activity, Decreased activity tolerance, Decreased balance, Decreased  coordination, Decreased endurance, Decreased mobility, Decreased strength, Difficulty walking, Impaired sensation, Impaired tone, Impaired UE functional use, Postural dysfunction, Pain, Abnormal gait  Visit Diagnosis: Unsteadiness on feet  Muscle weakness (generalized)  Other disturbances of skin sensation  Other abnormalities of gait and mobility  Transient paralysis     Problem List Patient Active Problem List   Diagnosis Date Noted   Acute on chronic respiratory failure with hypoxia (HCC)    Acute inflammatory demyelinating polyneuropathy (HCC)    Dysautonomia ([place])    Atrial fibrillation with RVR ([place])    Healthcare associated bacterial pneumonia     Rico Junker, PT, DPT 05/07/21    1:12 PM    [facility] [address] [place], [place], [address] Phone: [phone]   Fax:  [phone]  Name: Willey Due MRN: [id number] Date of Birth: 08-Jan-1956

## 2021-05-07 NOTE — Therapy (Signed)
[facility] [address] [place], [place], [address] Phone: [phone]   Fax:  [phone]  Occupational Therapy Treatment  Patient Details  Name: Brian Espinoza MRN: [id number] Date of Birth: [birth year] Referring Provider (OT): Shirlee Latch (Will send certification to PCP Venia Minks [facility])   Encounter Date: 05/07/2021   OT End of Session - 05/07/21 1108     Visit Number 13    Number of Visits 37    Date for OT Re-Evaluation 06/20/21    Authorization Type Medicare and Federal BCBS    Progress Note Due on Visit 20    OT Start Time 1102    OT Stop Time 1145    OT Time Calculation (min) 43 min    Activity Tolerance Patient tolerated treatment well    Behavior During Therapy WFL for tasks assessed/performed             Past Medical History:  Diagnosis Date   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 No pertinent surgical history.  There were no vitals filed for this visit.   Subjective Assessment - 05/07/21 1107     Subjective  I don't have anymore appts scheduled after this week.    Currently in Pain? No/denies    Pain Score 0-No pain                          OT Treatments/Exercises (OP) - 05/07/21 1120       ADLs   Eating simulated feeding with circumferential splints on BUE - max difficulty with manipulating spoon even with red foam grip on spoon and unable to sustain grasp.      Exercises   Exercises Hand      Neurological Re-education Exercises   Other Grasp and Release Exercises  completed with circumferential splints on BUE. picking up easy grip pegs with BUE - pt with stronger lateral pinch with LUE than RUE. Sensory deficits limiting grasp and sustained grasp  of materials.      Fine Motor Coordination (Hand/Wrist)   Fine Motor Coordination Flipping cards    Flipping cards circumferential splints on BUE - Jumbo cards with BUE, indifvidually, with mod/max difficulty                      OT Short Term Goals - 05/02/21 1641       OT SHORT TERM GOAL #1   Title Patient and caregiver will complete a home exercise program designed to improve strength in proximal UE's - shoulders and elbows    Time 4    Period Weeks    Status On-going   issued AROM exercises and gentle theraband tricep strengthening 04/30/21   Target Date 04/21/21      OT SHORT TERM GOAL #2   Title Following set up patient will hold a cup with lid and long straw in midline to allow him to take a sip on his own    Time 4    Period Weeks    Status On-going   completed with simulation in clinic on 04/30/21 - continue to assess for consistency     OT SHORT TERM GOAL #3   Title While seated in upright supported position, patient  will reach forward to make contact with upright target on table top (chest height) x 5 each arm    Time 4    Period Weeks    Status Achieved      OT SHORT TERM GOAL #4   Title Patient will be able to reach toward face with either right or left hand in preparation to scratch chin/cheek    Time 4    Period Weeks    Status Achieved      OT SHORT TERM GOAL #5   Title Patient will utilize cylindrical grip to hold small water bottle in one hand.    Time 4    Period Weeks    Status On-going   demonstrated holding bottle in RUE however not consistently 04/30/21              OT Long Term Goals - 05/02/21 1641       OT LONG TERM GOAL #1   Title Patient will complete an updated stretching program to prevent contracture in digits    Time 12    Period Weeks    Status On-going      OT LONG TERM GOAL #2   Title Patient and caregiver will demonstrate awareness of splint wear and care for BUE    Time 12    Period Weeks    Status On-going       OT LONG TERM GOAL #3   Title Patient will wash his face and upper body and upper legs with min assist    Time 12    Period Weeks    Status On-going      OT LONG TERM GOAL #4   Title Patient will actively bridge in bed to assist with pericare as needed    Time 12    Period Weeks    Status On-going      OT LONG TERM GOAL #5   Title Patient will transfer to adapted commode with max assist in prep for toileting    Time 12    Period Weeks    Status On-going      OT LONG TERM GOAL #6   Title Patient will transfer into/ out of shower with mod assist, and min assist for seated control during shower    Time 12    Period Weeks    Status On-going                   Plan - 05/07/21 1144     Clinical Impression Statement Continue to address seating and positioning for patient in loaner tilt in space. Pt continues to have significant lean and came in without seat belt again. Educated on importance of wearing the seatbelt as it holds his hips back for staying in chair. Verbalize understanding.    OT Occupational Profile and History Comprehensive Assessment- Review of records and extensive additional review of physical, cognitive, psychosocial history related to current functional performance    Occupational performance deficits (Please refer to evaluation for details): ADL's;IADL's;Rest and Sleep;Leisure    Body Structure / Function / Physical Skills ADL;Decreased knowledge of use of DME;Strength;Balance;Dexterity;GMC;Pain;Tone;Body mechanics;Proprioception;UE functional use;Cardiopulmonary status limiting activity;Endurance;IADL;ROM;Fascial restriction;Improper spinal/pelvic alignment;Coordination;Flexibility;Mobility;Sensation;Wound;FMC;Muscle spasms;Skin integrity    Clinical Decision Making Multiple treatment options, significant modification of task necessary    Comorbidities Affecting Occupational Performance: Presence of comorbidities impacting occupational performance     Comorbidities impacting occupational performance description: AIDP, Chronic back pain, Descending thoracic aortic anneurysm    Modification or Assistance to Complete Evaluation  Min-Moderate modification of tasks or assist with assess necessary to complete eval    OT Frequency 3x / week    OT Duration 12 weeks    OT Treatment/Interventions Self-care/ADL training;Moist Heat;Fluidtherapy;DME and/or AE instruction;Splinting;Balance training;Aquatic Therapy;Contrast Bath;Therapeutic activities;Ultrasound;Therapeutic exercise;Passive range of motion;Functional Mobility Training;Neuromuscular education;Electrical Stimulation;Manual Therapy;Patient/family education;Energy conservation    Plan make adjustments to splint PRN for carpal alignment/support and work on finger extension in splints - functional use, trunk control, bilateral hand use, check positioning in tilt in space for safety    Consulted and Agree with Plan of Care Patient;Family member/caregiver             Patient will benefit from skilled therapeutic intervention in order to improve the following deficits and impairments:   Body Structure / Function / Physical Skills: ADL, Decreased knowledge of use of DME, Strength, Balance, Dexterity, GMC, Pain, Tone, Body mechanics, Proprioception, UE functional use, Cardiopulmonary status limiting activity, Endurance, IADL, ROM, Fascial restriction, Improper spinal/pelvic alignment, Coordination, Flexibility, Mobility, Sensation, Wound, FMC, Muscle spasms, Skin integrity       Visit Diagnosis: Unsteadiness on feet  Other lack of coordination  Muscle weakness (generalized)  Other disturbances of skin sensation  Other abnormalities of gait and mobility  Stiffness of right hand, not elsewhere classified    Problem List Patient Active Problem List   Diagnosis Date Noted   Acute on chronic respiratory failure with hypoxia (HCC)    Acute inflammatory demyelinating polyneuropathy (HCC)     Dysautonomia (HCC)    Atrial fibrillation with RVR [facility])    Healthcare associated bacterial pneumonia     Junious Dresser, OT 05/07/2021, 11:45 AM  [facility] [address] [place], [place], [address] Phone: [phone]   Fax:  [phone]  Name: Orvan Papadakis MRN: [id number] Date of Birth: [birth year]

## 2021-05-09 ENCOUNTER — Other Ambulatory Visit: Payer: Self-pay

## 2021-05-09 ENCOUNTER — Ambulatory Visit: Payer: Medicare Other

## 2021-05-09 ENCOUNTER — Ambulatory Visit: Payer: Medicare Other | Admitting: Occupational Therapy

## 2021-05-09 ENCOUNTER — Encounter: Payer: Self-pay | Admitting: Occupational Therapy

## 2021-05-09 VITALS — BP 119/55

## 2021-05-09 DIAGNOSIS — R2689 Other abnormalities of gait and mobility: Secondary | ICD-10-CM

## 2021-05-09 DIAGNOSIS — M25642 Stiffness of left hand, not elsewhere classified: Secondary | ICD-10-CM

## 2021-05-09 DIAGNOSIS — R2681 Unsteadiness on feet: Secondary | ICD-10-CM

## 2021-05-09 DIAGNOSIS — R208 Other disturbances of skin sensation: Secondary | ICD-10-CM

## 2021-05-09 DIAGNOSIS — M6281 Muscle weakness (generalized): Secondary | ICD-10-CM

## 2021-05-09 DIAGNOSIS — R278 Other lack of coordination: Secondary | ICD-10-CM

## 2021-05-09 DIAGNOSIS — M25641 Stiffness of right hand, not elsewhere classified: Secondary | ICD-10-CM

## 2021-05-09 NOTE — Therapy (Signed)
[facility] [address] [place], [place], [address] Phone: [phone]   Fax:  [phone]  Occupational Therapy Treatment  Patient Details  Name: Brian Espinoza MRN: [id number] Date of Birth: September 09, 1955 Referring Provider (OT): Shirlee Latch (Will send certification to PCP Venia Minks [facility])   Encounter Date: 05/09/2021   OT End of Session - 05/09/21 1336     Visit Number 14    Number of Visits 37    Date for OT Re-Evaluation 06/20/21    Authorization Type Medicare and Federal BCBS    Progress Note Due on Visit 20    OT Start Time 1321    OT Stop Time 1400    OT Time Calculation (min) 39 min    Activity Tolerance Patient tolerated treatment well    Behavior During Therapy WFL for tasks assessed/performed             Past Medical History:  Diagnosis Date   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 No pertinent surgical history.  There were no vitals filed for this visit.   Subjective Assessment - 05/09/21 1335     Subjective  "I brought my splints with me today"    Currently in Pain? No/denies    Pain Score 0-No pain              Splint modifications to custom resting hand splints to accommodate increased range of motion for finger extension.   Manipulation of writing utensils with wearing of bilateral circumferential splints with using various styles of adapted utensils for increasing ability to sign name. Pt had most success with universal cuff (elastic) and encouraged to look into purchasing some for use for writing, feeding, grooming, etc. Pt able to write a "J" with approx 50% accuracy today.                      OT Short Term Goals - 05/02/21 1641       OT  SHORT TERM GOAL #1   Title Patient and caregiver will complete a home exercise program designed to improve strength in proximal UE's - shoulders and elbows    Time 4    Period Weeks    Status On-going   issued AROM exercises and gentle theraband tricep strengthening 04/30/21   Target Date 04/21/21      OT SHORT TERM GOAL #2   Title Following set up patient will hold a cup with lid and long straw in midline to allow him to take a sip on his own    Time 4    Period Weeks    Status On-going   completed with simulation in clinic on 04/30/21 - continue to assess for consistency     OT SHORT TERM GOAL #3   Title While seated in upright supported position, patient will reach forward to make contact with upright target on table top (chest height) x 5 each arm    Time 4    Period Weeks    Status Achieved      OT SHORT TERM GOAL #4   Title Patient will be able to reach toward face with either right or left hand in preparation to scratch chin/cheek    Time 4  Period Weeks    Status Achieved      OT SHORT TERM GOAL #5   Title Patient will utilize cylindrical grip to hold small water bottle in one hand.    Time 4    Period Weeks    Status On-going   demonstrated holding bottle in RUE however not consistently 04/30/21              OT Long Term Goals - 05/02/21 1641       OT LONG TERM GOAL #1   Title Patient will complete an updated stretching program to prevent contracture in digits    Time 12    Period Weeks    Status On-going      OT LONG TERM GOAL #2   Title Patient and caregiver will demonstrate awareness of splint wear and care for BUE    Time 12    Period Weeks    Status On-going      OT LONG TERM GOAL #3   Title Patient will wash his face and upper body and upper legs with min assist    Time 12    Period Weeks    Status On-going      OT LONG TERM GOAL #4   Title Patient will actively bridge in bed to assist with pericare as needed    Time 12    Period Weeks     Status On-going      OT LONG TERM GOAL #5   Title Patient will transfer to adapted commode with max assist in prep for toileting    Time 12    Period Weeks    Status On-going      OT LONG TERM GOAL #6   Title Patient will transfer into/ out of shower with mod assist, and min assist for seated control during shower    Time 12    Period Weeks    Status On-going                   Plan - 05/09/21 1409     Clinical Impression Statement Pt progressing with BUE functional use and education with equipment to assist with increasing independence.    OT Occupational Profile and History Comprehensive Assessment- Review of records and extensive additional review of physical, cognitive, psychosocial history related to current functional performance    Occupational performance deficits (Please refer to evaluation for details): ADL's;IADL's;Rest and Sleep;Leisure    Body Structure / Function / Physical Skills ADL;Decreased knowledge of use of DME;Strength;Balance;Dexterity;GMC;Pain;Tone;Body mechanics;Proprioception;UE functional use;Cardiopulmonary status limiting activity;Endurance;IADL;ROM;Fascial restriction;Improper spinal/pelvic alignment;Coordination;Flexibility;Mobility;Sensation;Wound;FMC;Muscle spasms;Skin integrity    Clinical Decision Making Multiple treatment options, significant modification of task necessary    Comorbidities Affecting Occupational Performance: Presence of comorbidities impacting occupational performance    Comorbidities impacting occupational performance description: AIDP, Chronic back pain, Descending thoracic aortic anneurysm    Modification or Assistance to Complete Evaluation  Min-Moderate modification of tasks or assist with assess necessary to complete eval    OT Frequency 3x / week    OT Duration 12 weeks    OT Treatment/Interventions Self-care/ADL training;Moist Heat;Fluidtherapy;DME and/or AE instruction;Splinting;Balance training;Aquatic Therapy;Contrast  Bath;Therapeutic activities;Ultrasound;Therapeutic exercise;Passive range of motion;Functional Mobility Training;Neuromuscular education;Electrical Stimulation;Manual Therapy;Patient/family education;Energy conservation    Plan make adjustments to splint PRN for carpal alignment/support and work on finger extension in splints - functional use, trunk control, bilateral hand use, check positioning in tilt in space for safety    Consulted and Agree with Plan of Care Patient;Family member/caregiver  Patient will benefit from skilled therapeutic intervention in order to improve the following deficits and impairments:   Body Structure / Function / Physical Skills: ADL, Decreased knowledge of use of DME, Strength, Balance, Dexterity, GMC, Pain, Tone, Body mechanics, Proprioception, UE functional use, Cardiopulmonary status limiting activity, Endurance, IADL, ROM, Fascial restriction, Improper spinal/pelvic alignment, Coordination, Flexibility, Mobility, Sensation, Wound, FMC, Muscle spasms, Skin integrity       Visit Diagnosis: Unsteadiness on feet  Stiffness of left hand, not elsewhere classified  Muscle weakness (generalized)  Other disturbances of skin sensation  Other abnormalities of gait and mobility  Stiffness of right hand, not elsewhere classified  Other lack of coordination    Problem List Patient Active Problem List   Diagnosis Date Noted   Acute on chronic respiratory failure with hypoxia (HCC)    Acute inflammatory demyelinating polyneuropathy (HCC)    Dysautonomia (HCC)    Atrial fibrillation with RVR [facility])    Healthcare associated bacterial pneumonia     Junious Dresser, OT 05/09/2021, 2:16 PM  [facility] [address] [place], [place], [address] Phone: [phone]   Fax:  [phone]  Name: Brian Espinoza MRN: [id number] Date of Birth: 1955/08/28

## 2021-05-09 NOTE — Therapy (Signed)
[facility] [address] [place], [place], [address] Phone: [phone]   Fax:  [phone]  Physical Therapy Treatment  Patient Details  Name: Brian Espinoza MRN: [id number] Date of Birth: 22-Dec-1955 Referring Provider (PT): Leandro Reasoner, MD   Encounter Date: 05/09/2021   PT End of Session - 05/09/21 1450     Visit Number 14    Number of Visits 25    Date for PT Re-Evaluation 05/18/21    Authorization Type Medicare A&B; BCBS - 10th visit PN    Progress Note Due on Visit 20    PT Start Time 1400    PT Stop Time 1445    PT Time Calculation (min) 45 min    Activity Tolerance Patient tolerated treatment well    Behavior During Therapy [facility] for tasks assessed/performed             Past Medical History:  Diagnosis Date   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 No pertinent surgical history.  Vitals:   05/09/21 1407  BP: (!) 119/55     Subjective Assessment - 05/09/21 1401     Subjective Pt reports his upper abdomen is still sore. Pt states his abdomen is sore until he moves around some. Pt states his right calf has felt more tight than usual espeically after trying to stand last session. Pt states calf pain is not present now but comes and goes depending on activity.    Patient is accompained by: Family member    Pertinent History Chest pain, dissection of descending thoracic aorta, aneurysm of artery, a-fib, HTN, systolic CHF, oral phase dysphagia, sacral wound, PNA    Patient Stated Goals Improve hand function and walk to be more independent with daily activities.  Gain strength in legs to be able to lift hips and reduce shear on sacrum.    Currently in Pain? No/denies    Pain Onset More than a month ago                                [facility] Adult PT Treatment/Exercise - 05/09/21 1454       Transfers   Transfers Sit to Stand;Stand to Sit;Lateral/Scoot Transfers    Sit to Stand 1: +2 Total assist    Sit to Stand Details (indicate cue type and reason) pt required 2 person assit to come up from elevated surface. PT cued pt to lean forward to assist w/ coming up from standing w/ legs.    Stand to Sit 1: +2 Total assist    Stand to Sit Details pt did a better job of being able to control descent today w/ legs. Still requires two person max assist.    Lateral/Scoot Transfers 2: Max assist    Lateral/Scoot Transfer Details (indicate cue type and reason) w/c <> mat with mod A for scooting to R to mat with pt demonstrating improved initiation of sit > squat; continued to require assistance to maintain weight shift forwards while pivoting to R.  Increased assistance required when returning to w/c to L due to LE fatigue.    Comments Once in standing pt verbally cued to weight shift over to RLE using mirror as a guide to see that  he is upright. Pt peformed reaching x5 BUE each to targets by SPT and PT to increase weight shift. Pt then performed x5 mini squats in standing focusing on improving BLE eccentric control. PT verbally cued pt to lean forward to assist w/ motion.      Therapeutic Activites    Therapeutic Activities Other Therapeutic Activities    Other Therapeutic Activities Sitting on edge of mat w/ mirror in front pt performed stepping to target in front and side w/ 1lb ankle weight 2x5 BLE each. Pt demonstrated increased difficutly w/ controlling movement in LLE espeically as pt fatigued on later sets. PT verbally cued pt to focus on moving slowly and picking legs up.      Knee/Hip Exercises: Aerobic   Other Aerobic SciFit x 5 min level 2 with BLE nad intermittent BUE.  Belt around legs to keep hips in neutral.                       PT Short Term Goals - 04/18/21  1708       PT SHORT TERM GOAL #1   Title Pt and wife will demonstrate ability to perform initial supine and seated HEP    Baseline 04/18/21 HEP provided    Time 4    Period Weeks    Status On-going    Target Date 04/18/21      PT SHORT TERM GOAL #2   Title Pt will demonstrate ability to perform rolling and supine <> sit with moderate assistance    Baseline 04/18/21 Pt required min assist    Time 4    Period Weeks    Status Achieved    Target Date 04/18/21      PT SHORT TERM GOAL #3   Title Pt will demonstrate ability to perform slideboard on level surfaces w/c <> mat with 50% assistance from PT    Baseline total A in hoyer 04/18/21 Pt required 75% assistance    Time 4    Period Weeks    Status Not Met    Target Date 04/18/21      PT SHORT TERM GOAL #4   Title Pt will tolerate 5 minutes sustained, supported standing (standing frame or SARA) while performing weight shifting, LE activation training, etc    Time 4    Period Weeks    Status Not Met    Target Date 04/18/21               PT Long Term Goals - 03/19/21 2200       PT LONG TERM GOAL #1   Title Pt and wife will demonstrate independence with progressed HEP    Time 8    Period Weeks    Status New    Target Date 05/18/21      PT LONG TERM GOAL #2   Title Pt will demonstrate ability to perform rolling and supine <> sit with minimal assistance    Time 8    Period Weeks    Status New    Target Date 05/18/21      PT LONG TERM GOAL #3   Title Pt will perform slideboard transfers w/c <> mat on level surface Min A and will begin to demonstrate ability to perform squat-scooting across board    Time 8    Period Weeks    Status New    Target Date 05/18/21      PT LONG TERM GOAL #4   Title Pt will  demonstrate ability to perform sit > stand from elevated surface with +2 assistance and UE support    Time 8    Period Weeks    Status New    Target Date 05/18/21                   Plan - 05/09/21  1513     Clinical Impression Statement Today's PT session focused on continued practice w/ transfers, initiating controlled movements in lower extremities, and weight shifting in standing. Pt continues to demonstrate deficits in intiating movement in BLE. Pt is still a 1 person max assist w/ transfers and a 2 person max assist to go from sitting and standing. PT will continue to progress pt as tolerated w/ POC toward pt achievement of LTG.    Personal Factors and Comorbidities Comorbidity 3+;Past/Current Experience;Time since onset of injury/illness/exacerbation    Comorbidities Chest pain, dissection of descending thoracic aorta, aneurysm of artery, a-fib, HTN, systolic CHF, oral phase dysphagia, sacral wound, PNA    Examination-Activity Limitations Bed Mobility;Bend;Locomotion Level;Sit;Stairs;Stand;Transfers;Lift;Hygiene/Grooming;Dressing;Toileting;Self Feeding;Reach Overhead;Bathing    Examination-Participation Restrictions Community Activity;Driving;Yard Work    Stability/Clinical Decision Making Evolving/Moderate complexity    Rehab Potential Good    PT Frequency 3x / week   updated in November; new cert sent with frequency updated   PT Duration 8 weeks    PT Treatment/Interventions ADLs/Self Care Home Management;DME Instruction;Gait training;Stair training;Functional mobility training;Therapeutic activities;Therapeutic exercise;Balance training;Neuromuscular re-education;Wheelchair mobility training;Orthotic Fit/Training;Patient/family education;Energy conservation    PT Next Visit Plan LTG DUE NEXT WEEK, MONITOR BP: Keep SBP between 120-130! SCI Fit in wheelchair with LE only and belt around thighs.  Continue to work on squat scooting transfers without slideboard.  Working towards sit > stand with UE support on platform walker/UpWalker.  Closed chain LE strengthening/WB.  Sit <> stand from elevated mat with 2 person assist to work on graded movement when coming back to sit as well or SARA - gait  belt around knees to prevent hip ABD/ER    Consulted and Agree with Plan of Care Patient             Patient will benefit from skilled therapeutic intervention in order to improve the following deficits and impairments:  Cardiopulmonary status limiting activity, Decreased activity tolerance, Decreased balance, Decreased coordination, Decreased endurance, Decreased mobility, Decreased strength, Difficulty walking, Impaired sensation, Impaired tone, Impaired UE functional use, Postural dysfunction, Pain, Abnormal gait  Visit Diagnosis: Muscle weakness (generalized)  Other abnormalities of gait and mobility  Unsteadiness on feet     Problem List Patient Active Problem List   Diagnosis Date Noted   Acute on chronic respiratory failure with hypoxia (HCC)    Acute inflammatory demyelinating polyneuropathy (HCC)    Dysautonomia ([place])    Atrial fibrillation with RVR [facility])    Healthcare associated bacterial pneumonia     Lottie Mussel, Student-PT 05/09/2021, 3:16 PM  [facility] [address] [place], [place], [address] Phone: [phone]   Fax:  [phone]  Name: Mikell Kazlauskas MRN: [id number] Date of Birth: [birth year]

## 2021-05-11 ENCOUNTER — Ambulatory Visit: Payer: Medicare Other | Admitting: Physical Therapy

## 2021-05-11 ENCOUNTER — Ambulatory Visit: Payer: Medicare Other | Admitting: Occupational Therapy

## 2021-05-11 ENCOUNTER — Encounter: Payer: Self-pay | Admitting: Occupational Therapy

## 2021-05-11 ENCOUNTER — Other Ambulatory Visit: Payer: Self-pay

## 2021-05-11 VITALS — BP 113/66 | HR 57

## 2021-05-11 DIAGNOSIS — M25642 Stiffness of left hand, not elsewhere classified: Secondary | ICD-10-CM

## 2021-05-11 DIAGNOSIS — R2681 Unsteadiness on feet: Secondary | ICD-10-CM

## 2021-05-11 DIAGNOSIS — R2689 Other abnormalities of gait and mobility: Secondary | ICD-10-CM

## 2021-05-11 DIAGNOSIS — M6281 Muscle weakness (generalized): Secondary | ICD-10-CM

## 2021-05-11 DIAGNOSIS — R295 Transient paralysis: Secondary | ICD-10-CM

## 2021-05-11 DIAGNOSIS — R208 Other disturbances of skin sensation: Secondary | ICD-10-CM

## 2021-05-11 DIAGNOSIS — R278 Other lack of coordination: Secondary | ICD-10-CM

## 2021-05-11 DIAGNOSIS — M25641 Stiffness of right hand, not elsewhere classified: Secondary | ICD-10-CM

## 2021-05-11 NOTE — Therapy (Signed)
[facility] [address] [place], [place], [address] Phone: [phone]   Fax:  [phone]  Occupational Therapy Treatment  Patient Details  Name: Brian Espinoza MRN: [id number] Date of Birth: May 04, 1956 Referring Provider (OT): Shirlee Latch (Will send certification to PCP Venia Minks [facility])   Encounter Date: 05/11/2021   OT End of Session - 05/11/21 1429     Visit Number 15    Number of Visits 37    Date for OT Re-Evaluation 06/20/21    Authorization Type Medicare and Federal BCBS    Progress Note Due on Visit 20    OT Start Time 1248   late arrival   OT Stop Time 1315    OT Time Calculation (min) 27 min    Activity Tolerance Patient tolerated treatment well    Behavior During Therapy WFL for tasks assessed/performed             Past Medical History:  Diagnosis Date   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 No pertinent surgical history.  There were no vitals filed for this visit.   Subjective Assessment - 05/11/21 1252     Subjective  Pt denies any pin. Reports splints are tolerating well at home with adjustments    Patient is accompanied by: Family member   son, Brian Espinoza   Currently in Pain? No/denies    Pain Score 0-No pain                          OT Treatments/Exercises (OP) - 05/11/21 1427       ADLs   Eating practiced bringing cup to mouth with BUE with ability to bring to mouth but lost control at mouth and descent. practiced with wrist universal cuff on RUE and spoon and scooping beans with max difficulty. Pt with more control with orthoses than without.      Neurological Re-education Exercises   Other Grasp and Release Exercises  picking up hecker chips  with RUE and placing into cup with emphasis on maintaining wrist extension to prevent wrist drop when going to drop chip into cup. Pt with mod drops and difficulty but deonstrates improved coordination.                      OT Short Term Goals - 05/02/21 1641       OT SHORT TERM GOAL #1   Title Patient and caregiver will complete a home exercise program designed to improve strength in proximal UE's - shoulders and elbows    Time 4    Period Weeks    Status On-going   issued AROM exercises and gentle theraband tricep strengthening 04/30/21   Target Date 04/21/21      OT SHORT TERM GOAL #2   Title Following set up patient will hold a cup with lid and long straw in midline to allow him to take a sip on his own    Time 4    Period Weeks    Status On-going   completed with simulation in clinic on 04/30/21 - continue to assess for consistency     OT SHORT TERM GOAL #3   Title While seated in upright supported position, patient will reach  forward to make contact with upright target on table top (chest height) x 5 each arm    Time 4    Period Weeks    Status Achieved      OT SHORT TERM GOAL #4   Title Patient will be able to reach toward face with either right or left hand in preparation to scratch chin/cheek    Time 4    Period Weeks    Status Achieved      OT SHORT TERM GOAL #5   Title Patient will utilize cylindrical grip to hold small water bottle in one hand.    Time 4    Period Weeks    Status On-going   demonstrated holding bottle in RUE however not consistently 04/30/21              OT Long Term Goals - 05/02/21 1641       OT LONG TERM GOAL #1   Title Patient will complete an updated stretching program to prevent contracture in digits    Time 12    Period Weeks    Status On-going      OT LONG TERM GOAL #2   Title Patient and caregiver will demonstrate awareness of splint wear and care for BUE    Time 12    Period Weeks    Status On-going      OT  LONG TERM GOAL #3   Title Patient will wash his face and upper body and upper legs with min assist    Time 12    Period Weeks    Status On-going      OT LONG TERM GOAL #4   Title Patient will actively bridge in bed to assist with pericare as needed    Time 12    Period Weeks    Status On-going      OT LONG TERM GOAL #5   Title Patient will transfer to adapted commode with max assist in prep for toileting    Time 12    Period Weeks    Status On-going      OT LONG TERM GOAL #6   Title Patient will transfer into/ out of shower with mod assist, and min assist for seated control during shower    Time 12    Period Weeks    Status On-going                   Plan - 05/11/21 1430     Clinical Impression Statement Pt with increased BUE functional use today with progressing towards self feeding and bringing cup to mouth.    OT Occupational Profile and History Comprehensive Assessment- Review of records and extensive additional review of physical, cognitive, psychosocial history related to current functional performance    Occupational performance deficits (Please refer to evaluation for details): ADL's;IADL's;Rest and Sleep;Leisure    Body Structure / Function / Physical Skills ADL;Decreased knowledge of use of DME;Strength;Balance;Dexterity;GMC;Pain;Tone;Body mechanics;Proprioception;UE functional use;Cardiopulmonary status limiting activity;Endurance;IADL;ROM;Fascial restriction;Improper spinal/pelvic alignment;Coordination;Flexibility;Mobility;Sensation;Wound;FMC;Muscle spasms;Skin integrity    Clinical Decision Making Multiple treatment options, significant modification of task necessary    Comorbidities Affecting Occupational Performance: Presence of comorbidities impacting occupational performance    Comorbidities impacting occupational performance description: AIDP, Chronic back pain, Descending thoracic aortic anneurysm    Modification or Assistance to Complete Evaluation   Min-Moderate modification of tasks or assist with assess necessary to complete eval    OT Frequency 3x / week    OT Duration 12 weeks  OT Treatment/Interventions Self-care/ADL training;Moist Heat;Fluidtherapy;DME and/or AE instruction;Splinting;Balance training;Aquatic Therapy;Contrast Bath;Therapeutic activities;Ultrasound;Therapeutic exercise;Passive range of motion;Functional Mobility Training;Neuromuscular education;Electrical Stimulation;Manual Therapy;Patient/family education;Energy conservation    Plan continue to work towards BUE functional use, trunk control and BUE strengthening and coordination    Consulted and Agree with Plan of Care Patient;Family member/caregiver             Patient will benefit from skilled therapeutic intervention in order to improve the following deficits and impairments:   Body Structure / Function / Physical Skills: ADL, Decreased knowledge of use of DME, Strength, Balance, Dexterity, GMC, Pain, Tone, Body mechanics, Proprioception, UE functional use, Cardiopulmonary status limiting activity, Endurance, IADL, ROM, Fascial restriction, Improper spinal/pelvic alignment, Coordination, Flexibility, Mobility, Sensation, Wound, FMC, Muscle spasms, Skin integrity       Visit Diagnosis: Muscle weakness (generalized)  Other abnormalities of gait and mobility  Unsteadiness on feet  Stiffness of left hand, not elsewhere classified  Other disturbances of skin sensation  Stiffness of right hand, not elsewhere classified  Other lack of coordination    Problem List Patient Active Problem List   Diagnosis Date Noted   Acute on chronic respiratory failure with hypoxia (HCC)    Acute inflammatory demyelinating polyneuropathy (HCC)    Dysautonomia (HCC)    Atrial fibrillation with RVR [facility])    Healthcare associated bacterial pneumonia     Brian Espinoza, OT 05/11/2021, 2:31 PM  [facility] [address] [place], [place], [address] Phone: [phone]   Fax:  [phone]  Name: Brian Espinoza MRN: [id number] Date of Birth: 1955-06-12

## 2021-05-13 NOTE — Therapy (Signed)
[facility] [place] [address] [place], [place], [address] Phone: [phone]   Fax:  [phone]  Physical Therapy Treatment  Patient Details  Name: Brian Espinoza MRN: [id number] Date of Birth: May 09, 1956 Referring Provider (PT): Leandro Reasoner, MD   Encounter Date: 05/11/2021   PT End of Session - 05/13/21 1223     Visit Number 15    Number of Visits 25    Date for PT Re-Evaluation 05/18/21    Authorization Type Medicare A&B; BCBS - 10th visit PN    Progress Note Due on Visit 20    PT Start Time 1315    PT Stop Time 1400    PT Time Calculation (min) 45 min    Activity Tolerance Patient tolerated treatment well    Behavior During Therapy [facility] for tasks assessed/performed             Past Medical History:  Diagnosis Date   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  Vitals:   05/11/21 1330  BP: 113/66  Pulse: (!) 57      05/11/21 1324  Symptoms/Limitations  Subjective Had a good week.  Son suprised him with a visit.  Patient is accompained by: Family member  Pertinent History Chest pain, dissection of descending thoracic aorta, aneurysm of artery, a-fib, HTN, systolic CHF, oral phase dysphagia, sacral wound, PNA  Patient Stated Goals Improve hand function and walk to be more independent with daily activities.  Gain strength in legs to be able to lift hips and reduce shear on sacrum.  Pain Assessment  Currently in Pain? No/denies  Pain Onset More than a month ago    Continued to perform transfers w/c <> mat with squat scooting without slideboard.  PT continued to provide support at LE to maintain neutral femur/hip position (prevent ABD/ER) and provide mod-max A to maintain weight shift forwards  over BOS to allow pt to initiate sit > squat.  Continued to provide cues for pivot to R or L once in squat position.  As pt fatigued he demonstrated longer delay of LE extension with hips sliding forward on seat - required multiple re-positioning of hips back on mat or seat.  Pt demonstrated improved control of LE when repositioning and when performing static positioning during transfer.  Once seated on mat set up with UpWalker for UE support with straps around each UE to maintain UE on platforms.  Performed 2 reps sit > stand from elevated mat with +3 assistance, PT on L side supporting LUE and LLE, PT in front to prevent RW sliding forwards and SPT on R side to support RUE and RLE.  During first stand focused on lateral weight shifting and lifting each heel off floor while maintaining upright trunk and LE extension.  During seated rest break encouraged pt to utilize bilat UE to hold and cup of water and bring to mouth with assistance from PT.  Second sit > stand pt demonstrated improved LE positioning and improved upright posture but when attempting to lift one foot off floor pt experienced sudden flexion moment in bilat LE with uncontrolled descent to mat.  Required 3 person assist to shift hips back on mat to prevent sliding off mat.  Due to LE pain, did not attempt third  stand.    Pt continues to report that when his LE are near or touch metal items it sends a shock through his LE.    PT Short Term Goals - 04/18/21 1708       PT SHORT TERM GOAL #1   Title Pt and wife will demonstrate ability to perform initial supine and seated HEP    Baseline 04/18/21 HEP provided    Time 4    Period Weeks    Status On-going    Target Date 04/18/21      PT SHORT TERM GOAL #2   Title Pt will demonstrate ability to perform rolling and supine <> sit with moderate assistance    Baseline 04/18/21 Pt required min assist    Time 4    Period Weeks    Status Achieved    Target Date 04/18/21      PT SHORT TERM  GOAL #3   Title Pt will demonstrate ability to perform slideboard on level surfaces w/c <> mat with 50% assistance from PT    Baseline total A in hoyer 04/18/21 Pt required 75% assistance    Time 4    Period Weeks    Status Not Met    Target Date 04/18/21      PT SHORT TERM GOAL #4   Title Pt will tolerate 5 minutes sustained, supported standing (standing frame or SARA) while performing weight shifting, LE activation training, etc    Time 4    Period Weeks    Status Not Met    Target Date 04/18/21               PT Long Term Goals - 03/19/21 2200       PT LONG TERM GOAL #1   Title Pt and wife will demonstrate independence with progressed HEP    Time 8    Period Weeks    Status New    Target Date 05/18/21      PT LONG TERM GOAL #2   Title Pt will demonstrate ability to perform rolling and supine <> sit with minimal assistance    Time 8    Period Weeks    Status New    Target Date 05/18/21      PT LONG TERM GOAL #3   Title Pt will perform slideboard transfers w/c <> mat on level surface Min A and will begin to demonstrate ability to perform squat-scooting across board    Time 8    Period Weeks    Status New    Target Date 05/18/21      PT LONG TERM GOAL #4   Title Pt will demonstrate ability to perform sit > stand from elevated surface with +2 assistance and UE support    Time 8    Period Weeks    Status New    Target Date 05/18/21                   Plan - 05/13/21 1224     Clinical Impression Statement Pt demonstrated improved motor control in LE in sitting when repositioning LE and during scooting transfers.  As pt fatigued he continues to required increased time and assistance to initiate LE extension to come to squat position.  Returned to sit > stand and static standing with UE supported on UpWalker with straps to maintain UE position on platforms.  Continues to require +3 to perform to stabilize UpWalker, to stabilize UE and LE on R and L side.  During seconds sit > stand pt experienced sudden flexion moment in bilat LE with uncontrolled descent to mat.  Pt may require significant LE external support to be able to maintain standing safely.    Personal Factors and Comorbidities Comorbidity 3+;Past/Current Experience;Time since onset of injury/illness/exacerbation    Comorbidities Chest pain, dissection of descending thoracic aorta, aneurysm of artery, a-fib, HTN, systolic CHF, oral phase dysphagia, sacral wound, PNA    Examination-Activity Limitations Bed Mobility;Bend;Locomotion Level;Sit;Stairs;Stand;Transfers;Lift;Hygiene/Grooming;Dressing;Toileting;Self Feeding;Reach Overhead;Bathing    Examination-Participation Restrictions Community Activity;Driving;Yard Work    Stability/Clinical Decision Making Evolving/Moderate complexity    Rehab Potential Good    PT Frequency 3x / week   updated in November; new cert sent with frequency updated   PT Duration 8 weeks    PT Treatment/Interventions ADLs/Self Care Home Management;DME Instruction;Gait training;Stair training;Functional mobility training;Therapeutic activities;Therapeutic exercise;Balance training;Neuromuscular re-education;Wheelchair mobility training;Orthotic Fit/Training;Patient/family education;Energy conservation    PT Next Visit Plan LTG DUE; recert for 12 more weeks 3x/week. MONITOR BP: Keep SBP between 120-130! SCI Fit in wheelchair with LE only and belt around thighs.  Continue to work on squat scooting transfers without slideboard.  Working towards sit > stand with UE support on platform walker/UpWalker.  Closed chain LE strengthening/WB.  Sit <> stand from elevated mat with 2 person assist to work on graded movement when coming back to sit as well or SARA - gait belt around knees to prevent hip ABD/ER.  May require bilat KAFOs to maintain LE Position in standing.    Consulted and Agree with Plan of Care Patient             Patient will benefit from skilled therapeutic  intervention in order to improve the following deficits and impairments:  Cardiopulmonary status limiting activity, Decreased activity tolerance, Decreased balance, Decreased coordination, Decreased endurance, Decreased mobility, Decreased strength, Difficulty walking, Impaired sensation, Impaired tone, Impaired UE functional use, Postural dysfunction, Pain, Abnormal gait  Visit Diagnosis: Muscle weakness (generalized)  Other abnormalities of gait and mobility  Transient paralysis  Unsteadiness on feet  Other disturbances of skin sensation     Problem List Patient Active Problem List   Diagnosis Date Noted   Acute on chronic respiratory failure with hypoxia (HCC)    Acute inflammatory demyelinating polyneuropathy (HCC)    Dysautonomia (HCC)    Atrial fibrillation with RVR ([place])    Healthcare associated bacterial pneumonia     Rico Junker, PT, DPT 05/13/21    1:18 PM   [place] [address] [place], [place], [address] Phone: [phone]   Fax:  [phone]  Name: Michiah Mudry MRN: [id number] Date of Birth: Aug 13, 1955

## 2021-05-16 ENCOUNTER — Ambulatory Visit: Payer: Medicare Other | Admitting: Occupational Therapy

## 2021-05-18 ENCOUNTER — Ambulatory Visit: Payer: Medicare Other | Admitting: Physical Therapy

## 2021-05-18 ENCOUNTER — Other Ambulatory Visit: Payer: Self-pay

## 2021-05-18 ENCOUNTER — Ambulatory Visit: Payer: Medicare Other | Admitting: Occupational Therapy

## 2021-05-18 ENCOUNTER — Encounter: Payer: Self-pay | Admitting: Occupational Therapy

## 2021-05-18 VITALS — BP 108/60 | HR 59

## 2021-05-18 DIAGNOSIS — R295 Transient paralysis: Secondary | ICD-10-CM

## 2021-05-18 DIAGNOSIS — R208 Other disturbances of skin sensation: Secondary | ICD-10-CM

## 2021-05-18 DIAGNOSIS — M25642 Stiffness of left hand, not elsewhere classified: Secondary | ICD-10-CM

## 2021-05-18 DIAGNOSIS — R2681 Unsteadiness on feet: Secondary | ICD-10-CM

## 2021-05-18 DIAGNOSIS — M6281 Muscle weakness (generalized): Secondary | ICD-10-CM

## 2021-05-18 DIAGNOSIS — M25641 Stiffness of right hand, not elsewhere classified: Secondary | ICD-10-CM

## 2021-05-18 DIAGNOSIS — R278 Other lack of coordination: Secondary | ICD-10-CM

## 2021-05-18 DIAGNOSIS — R2689 Other abnormalities of gait and mobility: Secondary | ICD-10-CM

## 2021-05-18 NOTE — Therapy (Signed)
[facility] [address] [place], [place], [address] Phone: [phone]   Fax:  [phone]  Physical Therapy Treatment  Patient Details  Name: Brian Espinoza MRN: [id number] Date of Birth: 1955-09-22 Referring Provider (PT): Leandro Reasoner, MD   Encounter Date: 05/18/2021   PT End of Session - 05/18/21 1314     Visit Number 16    Number of Visits 25    Date for PT Re-Evaluation 05/18/21    Authorization Type Medicare A&B; BCBS - 10th visit PN    Progress Note Due on Visit 20    PT Start Time 1145    PT Stop Time 1240    PT Time Calculation (min) 55 min    Activity Tolerance Patient tolerated treatment well    Behavior During Therapy WFL for tasks assessed/performed             Past Medical History:  Diagnosis Date   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  Vitals:   05/18/21 1157  BP: 108/60  Pulse: (!) 59     Subjective Assessment - 05/18/21 1151     Subjective Last Saturday pt had transportation take him to his granddaughter's basketball game.  Sacral wound is almost completely filled in; pain is less.  LE are "going crazy" today.  Sometimes he wakes up and his LE are tangled up and off the side of the bed.    Patient is accompained by: Family member    Pertinent History Chest pain, dissection of descending thoracic aorta, aneurysm of artery, a-fib, HTN, systolic CHF, oral phase dysphagia, sacral wound, PNA    Patient Stated Goals Improve hand function and walk to be more independent with daily activities.  Gain strength in legs to be able to lift hips and reduce shear on sacrum.    Currently in Pain? No/denies    Pain Onset More than a month ago                [facility] PT Assessment  - 05/18/21 1209       Assessment   Medical Diagnosis Rosalee Kaufman    Referring Provider (PT) Leandro Reasoner, MD    Onset Date/Surgical Date 03/14/21    Hand Dominance Right    Next MD Visit 03/29/21    Prior Therapy Acute care, [facility], [facility], SNF      Precautions   Precautions Other (comment)    Precaution Comments Monitor systolic BP: keep between 120-130; Chest pain, dissection of descending thoracic aorta, aneurysm of artery, a-fib, HTN, systolic CHF, oral phase dysphagia, sacral wound, PNA    Required Braces or Orthoses Other Brace/Splint    Other Brace/Splint has multiple resting hand splints      Prior Function   Level of Independence Independent with basic ADLs;Independent with community mobility without device    Vocation Retired      [employer] Left;Right Sidelying to Sit;Sit to Supine    Rolling Right Supervision/verbal cueing    Rolling Left Supervision/Verbal cueing    Right Sidelying to Sit Moderate Assistance - Patient 50-74%   assistance to position LUE under shoulder and to initiate pushing upright once LE lowered to floor; assistance to keep trunk forwards.  Sit to Supine Moderate Assistance - Patient 50-74%   assistance to lower to LUE and to support trunk while pt brought up each LE onto mat     Transfers   Transfers Lateral/Scoot Transfers    Lateral/Scoot Transfers 3: Mod assist;2: Max assist    Lateral/Scoot Transfer Details (indicate cue type and reason) w/c <> mat with PT in front assisting with maintaining feet placement and knee/hip position while pt performed anterior lean over therapist's shoulders; transferring to R to mat pt able to come to squat with mod A and maintain squat to perform only 2 pivots to mat.  Returning to w/c (going towards stronger side) pt required max A and 3 scoots.  Performed 3 posterior scoots to back of wheelchair to set up for SciFit.    Comments In supine performed bridges to reposition  hips to prepare for rolling      Balance   Balance Assessed Yes      Dynamic Sitting Balance   Dynamic Sitting - Balance Support Feet supported;Right upper extremity supported;Left upper extremity supported;During functional activity    Dynamic Sitting - Level of Assistance 4: Min assist;3: Mod assist    Dynamic Sitting - Balance Activities Lateral lean/weight shifting;Forward lean/weight shifting    Dynamic Sitting balance - Comments Short sitting on mat: performed lateral leans down to R and L elbow first on pillow x 5 reps each side with therapist providing cues and facilitation of anterior lean when pushing through UE back up into sitting.  Min A for R side, supervision for L side.  Removed pillow and repeated with min-mod A for R side.  Added resistance to trunk when performing L side > sitting for increased activation.  Also performed forward leaning to bring hands to chair arm rests - therapist assisted with maintaining UE support on arm rests while pt continued to lean forwards to bring COG over BOS x 2 sets x 5 reps.  No pain in wrists but reported tension in hips.               [facility] Adult PT Treatment/Exercise - 05/18/21 1209       Knee/Hip Exercises: Aerobic   Other Aerobic SciFit x 5 min level 2.5 with bilat LE maintaining steps per minute at 78-79.  Belt around legs to keep hips in neutral.      Knee/Hip Exercises: Seated   Marching Strengthening;Right;Left;1 set;5 reps;Limitations    Marching Limitations manual resistance from PT             PT Education - 05/18/21 1313     Education Details goals met, areas to continue to focus on with new cert period    Person(s) Educated Patient    Methods Explanation    Comprehension Verbalized understanding              PT Short Term Goals - 04/18/21 1708       PT SHORT TERM GOAL #1   Title Pt and wife will demonstrate ability to perform initial supine and seated HEP    Baseline 04/18/21 HEP provided    Time 4     Period Weeks    Status On-going    Target Date 04/18/21      PT SHORT TERM GOAL #2   Title Pt will demonstrate ability to perform rolling and supine <> sit with moderate assistance    Baseline 04/18/21 Pt required min assist    Time 4    Period Weeks  Status Achieved    Target Date 04/18/21      PT SHORT TERM GOAL #3   Title Pt will demonstrate ability to perform slideboard on level surfaces w/c <> mat with 50% assistance from PT    Baseline total A in hoyer 04/18/21 Pt required 75% assistance    Time 4    Period Weeks    Status Not Met    Target Date 04/18/21      PT SHORT TERM GOAL #4   Title Pt will tolerate 5 minutes sustained, supported standing (standing frame or SARA) while performing weight shifting, LE activation training, etc    Time 4    Period Weeks    Status Not Met    Target Date 04/18/21               PT Long Term Goals - 05/18/21 1314       PT LONG TERM GOAL #1   Title Pt and wife will demonstrate independence with progressed HEP    Time 8    Period Weeks    Status Achieved    Target Date 05/18/21      PT LONG TERM GOAL #2   Title Pt will demonstrate ability to perform rolling and supine <> sit with minimal assistance    Time 8    Period Weeks    Status Achieved    Target Date 05/18/21      PT LONG TERM GOAL #3   Title Pt will perform slideboard transfers w/c <> mat on level surface Min A and will begin to demonstrate ability to perform squat-scooting across board    Time 8    Period Weeks    Status Achieved    Target Date 05/18/21      PT LONG TERM GOAL #4   Title Pt will demonstrate ability to perform sit > stand from elevated surface with +2 assistance and UE support    Time 8    Period Weeks    Status Achieved    Target Date 05/18/21            New goals for recertification:  PT Short Term Goals - 05/18/21 1324       PT SHORT TERM GOAL #1   Title = LTG for 30 days 1/22, 60 days 2/21, 90 days 3/23             PT  Long Term Goals - 05/18/21 1323       PT LONG TERM GOAL #1   Title Pt and wife will demonstrate independence with progressed HEP (LTG set at 30 days - reset to 60 days 2/21 and 90 days 3/23)    Time 4    Period Weeks    Status Revised    Target Date 06/17/21      PT LONG TERM GOAL #2   Title Pt will demonstrate ability to perform supine <> sit on flat mat with min A    Baseline mod-max A    Time 4    Period Weeks    Status Revised    Target Date 06/17/21      PT LONG TERM GOAL #3   Title Pt will perform squat-scooting transfers w/c <> mat consistently to L and R on level surface with mod A    Baseline mod-max A    Time 4    Period Weeks    Status Revised    Target Date 06/17/21      PT  LONG TERM GOAL #4   Title Pt will demonstrate ability to perform sit > stand from elevated mat with mod A, maintain prolonged standing with UE support x 5 minutes and return to sitting with max A for controlled descent    Time 4    Period Weeks    Status Revised    Target Date 06/17/21                    Plan - 05/18/21 1315     Clinical Impression Statement Pt is making steady progress and has met all LTG.  Pt has not experience any return of sensation in LE but is demonstrating improvements in functional strength and motor planning/motor control in LE.  Pt has met bed mobility and transfer goals and is able to perform supine <> sit with min-mod A, rolling with supervision and is performing lateral scooting transfers w/c <> mat with mod-max A without use of slideboard.  Pt has utilized multiple methods for standing and all continue to require Total A and significant external support to maintain standing and to control descent back into sitting.  Pt lacks LE control to initiate stepping training right now.  Pt will benefit from continued skilled PT services to address ongoing impairments to maximize functional mobility independence and decrease caregiver burden of care.    Comorbidities  Chest pain, dissection of descending thoracic aorta, aneurysm of artery, a-fib, HTN, systolic CHF, oral phase dysphagia, sacral wound, PNA    Rehab Potential Good    PT Frequency 3x / week    PT Duration 12 weeks    PT Treatment/Interventions ADLs/Self Care Home Management;DME Instruction;Gait training;Stair training;Functional mobility training;Therapeutic activities;Therapeutic exercise;Balance training;Neuromuscular re-education;Wheelchair mobility training;Orthotic Fit/Training;Patient/family education;Energy conservation    PT Next Visit Plan MONITOR BP: Keep SBP between 120-130! SCI Fit in wheelchair with LE only and belt around thighs.  Continue to work on squat scooting transfers without slideboard.  Working towards sit > stand with UE support on platform walker/UpWalker.  Closed chain LE strengthening/WB.  Sit <> stand from elevated mat with 2 person assist to work on graded movement when coming back to sit as well or SARA - gait belt around knees to prevent hip ABD/ER.  May require bilat KAFOs to maintain LE Position in standing.    Consulted and Agree with Plan of Care Patient             Patient will benefit from skilled therapeutic intervention in order to improve the following deficits and impairments:  Cardiopulmonary status limiting activity, Decreased activity tolerance, Decreased balance, Decreased coordination, Decreased endurance, Decreased mobility, Decreased strength, Difficulty walking, Impaired sensation, Impaired tone, Impaired UE functional use, Postural dysfunction, Pain, Abnormal gait  Visit Diagnosis: Muscle weakness (generalized)  Other abnormalities of gait and mobility  Transient paralysis  Unsteadiness on feet  Other disturbances of skin sensation     Problem List Patient Active Problem List   Diagnosis Date Noted   Acute on chronic respiratory failure with hypoxia (HCC)    Acute inflammatory demyelinating polyneuropathy ([facility])    Dysautonomia ([facility])     Atrial fibrillation with RVR ([place])    Healthcare associated bacterial pneumonia     Rico Junker, PT, DPT 05/18/21    1:22 PM    [facility] [address] [place], [place], [address] Phone: [phone]   Fax:  [phone]  Name: Grabiel Schmutz MRN: [id number] Date of Birth: 15-May-1956

## 2021-05-18 NOTE — Therapy (Signed)
[facility] [address] [place], [place], [address] Phone: [phone]   Fax:  [phone]  Occupational Therapy Treatment  Patient Details  Name: Brian Espinoza MRN: [id number] Date of Birth: 1955/11/04 Referring Provider (OT): Shirlee Latch (Will send certification to PCP Venia [facility])   Encounter Date: 05/18/2021   OT End of Session - 05/18/21 1109     Visit Number 16    Number of Visits 37    Date for OT Re-Evaluation 06/20/21    Authorization Type Medicare and Federal BCBS    Progress Note Due on Visit 20    OT Start Time 1104    OT Stop Time 1145    OT Time Calculation (min) 41 min    Activity Tolerance Patient tolerated treatment well    Behavior During Therapy WFL for tasks assessed/performed             Past Medical History:  Diagnosis Date   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 No pertinent surgical history.  There were no vitals filed for this visit.   Subjective Assessment - 05/18/21 1108     Subjective  "I got one of my splints - the velcro is not sticking. I feel like my hands are opening up better though"    Currently in Pain? No/denies    Pain Score 0-No pain              Splint Adjustments for adjusting velcro and reapplying where some had come undone.   Coordination with wearing of circumferential splints on BUE. Pt removed color dowels with BUE (RUE>LUE) with mod difficulty as task progressed. Pt with more difficulty with grasping to place into board. Discontinued activity with placing dowels back in.  Grasping 1 inch blocks with BUE (RUE>LUE) and plcing into bowl with min difficulty and continuing to use supination for grasping and maintaining hold of 1  inch blocks.   Simulated Eating with theraputty and built up fork with mod difficulty but able to spear putty and bring to mouth with difficulty sustaining grasp on fork.                      OT Short Term Goals - 05/02/21 1641       OT SHORT TERM GOAL #1   Title Patient and caregiver will complete a home exercise program designed to improve strength in proximal UE's - shoulders and elbows    Time 4    Period Weeks    Status On-going   issued AROM exercises and gentle theraband tricep strengthening 04/30/21   Target Date 04/21/21      OT SHORT TERM GOAL #2   Title Following set up patient will hold a cup with lid and long straw in midline to allow him to take a sip on his own    Time 4    Period Weeks    Status On-going   completed with simulation in clinic on 04/30/21 - continue to assess for consistency     OT SHORT TERM GOAL #3   Title While seated in upright supported position, patient will reach forward to make contact with upright target on table top (chest height) x 5 each arm    Time 4  Period Weeks    Status Achieved      OT SHORT TERM GOAL #4   Title Patient will be able to reach toward face with either right or left hand in preparation to scratch chin/cheek    Time 4    Period Weeks    Status Achieved      OT SHORT TERM GOAL #5   Title Patient will utilize cylindrical grip to hold small water bottle in one hand.    Time 4    Period Weeks    Status On-going   demonstrated holding bottle in RUE however not consistently 04/30/21              OT Long Term Goals - 05/02/21 1641       OT LONG TERM GOAL #1   Title Patient will complete an updated stretching program to prevent contracture in digits    Time 12    Period Weeks    Status On-going      OT LONG TERM GOAL #2   Title Patient and caregiver will demonstrate awareness of splint wear and care for BUE    Time 12    Period Weeks    Status On-going      OT LONG TERM GOAL #3   Title  Patient will wash his face and upper body and upper legs with min assist    Time 12    Period Weeks    Status On-going      OT LONG TERM GOAL #4   Title Patient will actively bridge in bed to assist with pericare as needed    Time 12    Period Weeks    Status On-going      OT LONG TERM GOAL #5   Title Patient will transfer to adapted commode with max assist in prep for toileting    Time 12    Period Weeks    Status On-going      OT LONG TERM GOAL #6   Title Patient will transfer into/ out of shower with mod assist, and min assist for seated control during shower    Time 12    Period Weeks    Status On-going                   Plan - 05/18/21 1241     Clinical Impression Statement Pt continues to make progress with increased functional use with BUE. Pt insturcted on wear and care of circumferential splints and sent home for assisting with coordination, HEP and functional tasks.    OT Occupational Profile and History Comprehensive Assessment- Review of records and extensive additional review of physical, cognitive, psychosocial history related to current functional performance    Occupational performance deficits (Please refer to evaluation for details): ADL's;IADL's;Rest and Sleep;Leisure    Body Structure / Function / Physical Skills ADL;Decreased knowledge of use of DME;Strength;Balance;Dexterity;GMC;Pain;Tone;Body mechanics;Proprioception;UE functional use;Cardiopulmonary status limiting activity;Endurance;IADL;ROM;Fascial restriction;Improper spinal/pelvic alignment;Coordination;Flexibility;Mobility;Sensation;Wound;FMC;Muscle spasms;Skin integrity    Clinical Decision Making Multiple treatment options, significant modification of task necessary    Comorbidities Affecting Occupational Performance: Presence of comorbidities impacting occupational performance    Comorbidities impacting occupational performance description: AIDP, Chronic back pain, Descending thoracic aortic  anneurysm    Modification or Assistance to Complete Evaluation  Min-Moderate modification of tasks or assist with assess necessary to complete eval    OT Frequency 3x / week    OT Duration 12 weeks    OT Treatment/Interventions Self-care/ADL training;Moist Heat;Fluidtherapy;DME and/or AE instruction;Splinting;Balance training;Aquatic  Therapy;Contrast Bath;Therapeutic activities;Ultrasound;Therapeutic exercise;Passive range of motion;Functional Mobility Training;Neuromuscular education;Electrical Stimulation;Manual Therapy;Patient/family education;Energy conservation    Plan continue progressing towards functional use with BUE, transfers (SB), continue BUE strengthening and coordination    Consulted and Agree with Plan of Care Patient;Family member/caregiver             Patient will benefit from skilled therapeutic intervention in order to improve the following deficits and impairments:   Body Structure / Function / Physical Skills: ADL, Decreased knowledge of use of DME, Strength, Balance, Dexterity, GMC, Pain, Tone, Body mechanics, Proprioception, UE functional use, Cardiopulmonary status limiting activity, Endurance, IADL, ROM, Fascial restriction, Improper spinal/pelvic alignment, Coordination, Flexibility, Mobility, Sensation, Wound, FMC, Muscle spasms, Skin integrity       Visit Diagnosis: Other lack of coordination  Muscle weakness (generalized)  Other abnormalities of gait and mobility  Unsteadiness on feet  Stiffness of left hand, not elsewhere classified  Stiffness of right hand, not elsewhere classified  Other disturbances of skin sensation    Problem List Patient Active Problem List   Diagnosis Date Noted   Acute on chronic respiratory failure with hypoxia (HCC)    Acute inflammatory demyelinating polyneuropathy (HCC)    Dysautonomia (HCC)    Atrial fibrillation with RVR [facility])    Healthcare associated bacterial pneumonia     Junious Dresser,  OT 05/18/2021, 12:42 PM  [facility] [address] [place], [place], [address] Phone: [phone]   Fax:  [phone]  Name: Brian Espinoza MRN: [id number] Date of Birth: 1956-01-08

## 2021-05-30 ENCOUNTER — Encounter: Payer: Self-pay | Admitting: Occupational Therapy

## 2021-05-30 ENCOUNTER — Ambulatory Visit: Payer: Medicare Other

## 2021-05-30 ENCOUNTER — Ambulatory Visit: Payer: Medicare Other | Attending: Cardiovascular Disease | Admitting: Occupational Therapy

## 2021-05-30 ENCOUNTER — Other Ambulatory Visit: Payer: Self-pay

## 2021-05-30 DIAGNOSIS — R278 Other lack of coordination: Secondary | ICD-10-CM | POA: Insufficient documentation

## 2021-05-30 DIAGNOSIS — R2681 Unsteadiness on feet: Secondary | ICD-10-CM | POA: Insufficient documentation

## 2021-05-30 DIAGNOSIS — R295 Transient paralysis: Secondary | ICD-10-CM | POA: Diagnosis present

## 2021-05-30 DIAGNOSIS — M25642 Stiffness of left hand, not elsewhere classified: Secondary | ICD-10-CM | POA: Insufficient documentation

## 2021-05-30 DIAGNOSIS — M6281 Muscle weakness (generalized): Secondary | ICD-10-CM

## 2021-05-30 DIAGNOSIS — R293 Abnormal posture: Secondary | ICD-10-CM | POA: Insufficient documentation

## 2021-05-30 DIAGNOSIS — M25641 Stiffness of right hand, not elsewhere classified: Secondary | ICD-10-CM | POA: Insufficient documentation

## 2021-05-30 DIAGNOSIS — R2689 Other abnormalities of gait and mobility: Secondary | ICD-10-CM

## 2021-05-30 DIAGNOSIS — R208 Other disturbances of skin sensation: Secondary | ICD-10-CM | POA: Insufficient documentation

## 2021-05-30 NOTE — Therapy (Signed)
[facility] [address] [place], [place], [address] Phone: [phone]   Fax:  [phone]  Occupational Therapy Treatment  Patient Details  Name: Brian Espinoza MRN: [id number] Date of Birth: 1955-11-07 Referring Provider (OT): Aundra Dubin (Will send certification to PCP [place])   Encounter Date: 05/30/2021   OT End of Session - 05/30/21 1351     Visit Number 17    Number of Visits 37    Date for OT Re-Evaluation 06/20/21    Authorization Type Medicare and Federal BCBS    Progress Note Due on Visit 20    OT Start Time 1235    OT Stop Time 1318    OT Time Calculation (min) 43 min    Activity Tolerance Patient tolerated treatment well    Behavior During Therapy WFL for tasks assessed/performed             Past Medical History:  Diagnosis Date   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 No pertinent surgical history.  There were no vitals filed for this visit.   Subjective Assessment - 05/30/21 1332     Subjective  Patient indicates no pain, and no trouble with splints. Wearing resting hand splints at night, and not really wwearing wrist braces during day.  I just get tired of braces.    Currently in Pain? No/denies    Pain Score 0-No pain                          OT Treatments/Exercises (OP) - 05/30/21 1336       ADLs   Eating Hand to mouth to drink from styrofoam cup with straw.  Patient used right hand to shape around cup, then left hand to maintain contact with bottom of cup.  Worked on controlled elbow flex/ext for this task.  Worked on stabilizing wrist throughout motion.  Patient needed guiding assist throughout performance of this task    UB Dressing  Patient able to reach up to face and attemot to remove his eyeglasses useing both hands.  Needed cueing and facilitation to increase wrist extension with this task - with attempts toclose fingers, loses wrist control.  With slowed motor response often able to control wrist position    Functional Mobility Working on functional scooting to come to edge of table.  Patient needed cueing and facilitation - mod to sufficiently weight shift then rotate to slide leg forward one at a time.  Assist to maintain feet on floor and sufficient weight shift forward to use legs to lift off surface.      Neurological Re-education Exercises   Other Exercises 1 Unsupported sitting - able to complete dowel BUE exercises.  Patient able to hold dowel with hands in supine.  Able to complete bicep curls with intermittent assist to maintain Left hand on dowel.  Able to add 1 lb weight to dowel and patient could complete 10 reps.  Patient able to complete shoulder flex with elbow extension - and with 1lb weight on dowel.  Yellow theraband to address tricep press (antigravity) chest press bilaterally x 10 reps.  OT Short Term Goals - 05/30/21 1353       OT SHORT TERM GOAL #1   Title Patient and caregiver will complete a home exercise program designed to improve strength in proximal UE's - shoulders and elbows    Time 4    Period Weeks    Status Achieved   issued AROM exercises and gentle theraband tricep strengthening 04/30/21   Target Date 04/21/21      OT SHORT TERM GOAL #2   Title Following set up patient will hold a cup with lid and long straw in midline to allow him to take a sip on his own    Time 4    Period Weeks    Status Partially Met   completed with simulation in clinic on 04/30/21 - continue to assess for consistency     OT SHORT TERM GOAL #3   Title While seated in upright supported position, patient will reach forward to make contact with upright target on table top (chest  height) x 5 each arm    Time 4    Period Weeks    Status Achieved      OT SHORT TERM GOAL #4   Title Patient will be able to reach toward face with either right or left hand in preparation to scratch chin/cheek    Time 4    Period Weeks    Status Achieved      OT SHORT TERM GOAL #5   Title Patient will utilize cylindrical grip to hold small water bottle in one hand.    Time 4    Period Weeks    Status Partially Met   demonstrated holding bottle in RUE however not consistently 04/30/21              OT Long Term Goals - 05/30/21 1354       OT LONG TERM GOAL #1   Title Patient will complete an updated stretching program to prevent contracture in digits    Time 12    Period Weeks    Status On-going      OT LONG TERM GOAL #2   Title Patient and caregiver will demonstrate awareness of splint wear and care for BUE    Time 12    Period Weeks    Status On-going      OT LONG TERM GOAL #3   Title Patient will wash his face and upper body and upper legs with min assist    Time 12    Period Weeks    Status On-going      OT LONG TERM GOAL #4   Title Patient will actively bridge in bed to assist with pericare as needed    Time 12    Period Weeks    Status On-going      OT LONG TERM GOAL #5   Title Patient will transfer to adapted commode with max assist in prep for toileting    Time 12    Period Weeks    Status On-going      OT LONG TERM GOAL #6   Title Patient will transfer into/ out of shower with mod assist, and min assist for seated control during shower    Time 12    Period Weeks    Status On-going                   Plan - 05/30/21 1351     Clinical Impression Statement Pt showing steady progress with improved  postural control, sitting balance, range of motion UE's, and functional transfers.  Needs continued emphasis on UE strength and functional use of UE's.    OT Occupational Profile and History Comprehensive Assessment- Review of records and  extensive additional review of physical, cognitive, psychosocial history related to current functional performance    Occupational performance deficits (Please refer to evaluation for details): ADL's;IADL's;Rest and Sleep;Leisure    Body Structure / Function / Physical Skills ADL;Decreased knowledge of use of DME;Strength;Balance;Dexterity;GMC;Pain;Tone;Body mechanics;Proprioception;UE functional use;Cardiopulmonary status limiting activity;Endurance;IADL;ROM;Fascial restriction;Improper spinal/pelvic alignment;Coordination;Flexibility;Mobility;Sensation;Wound;FMC;Muscle spasms;Skin integrity    Clinical Decision Making Multiple treatment options, significant modification of task necessary    Comorbidities Affecting Occupational Performance: Presence of comorbidities impacting occupational performance    Comorbidities impacting occupational performance description: AIDP, Chronic back pain, Descending thoracic aortic anneurysm    Modification or Assistance to Complete Evaluation  Min-Moderate modification of tasks or assist with assess necessary to complete eval    OT Frequency 3x / week    OT Duration 12 weeks    OT Treatment/Interventions Self-care/ADL training;Moist Heat;Fluidtherapy;DME and/or AE instruction;Splinting;Balance training;Aquatic Therapy;Contrast Bath;Therapeutic activities;Ultrasound;Therapeutic exercise;Passive range of motion;Functional Mobility Training;Neuromuscular education;Electrical Stimulation;Manual Therapy;Patient/family education;Energy conservation    Plan continue progressing towards functional use with BUE, transfers (SB), continue BUE strengthening and coordination    Consulted and Agree with Plan of Care Patient;Family member/caregiver    Family Member Consulted wife Lattie Haw             Patient will benefit from skilled therapeutic intervention in order to improve the following deficits and impairments:   Body Structure / Function / Physical Skills: ADL, Decreased  knowledge of use of DME, Strength, Balance, Dexterity, GMC, Pain, Tone, Body mechanics, Proprioception, UE functional use, Cardiopulmonary status limiting activity, Endurance, IADL, ROM, Fascial restriction, Improper spinal/pelvic alignment, Coordination, Flexibility, Mobility, Sensation, Wound, FMC, Muscle spasms, Skin integrity       Visit Diagnosis: Other lack of coordination  Muscle weakness (generalized)  Unsteadiness on feet  Stiffness of left hand, not elsewhere classified  Stiffness of right hand, not elsewhere classified  Other disturbances of skin sensation  Abnormal posture    Problem List Patient Active Problem List   Diagnosis Date Noted   Acute on chronic respiratory failure with hypoxia (HCC)    Acute inflammatory demyelinating polyneuropathy ([place])    Dysautonomia ([facility])    Atrial fibrillation with RVR ([facility])    Healthcare associated bacterial pneumonia     Mariah Milling, OT 05/30/2021, 1:55 PM  [facility] [address] [place], [place], [address] Phone: [phone]   Fax:  [phone]  Name: Letroy Vazguez MRN: [id number] Date of Birth: 1955-07-06

## 2021-05-30 NOTE — Therapy (Signed)
[facility] [address] [place], [place], [address] Phone: [phone]   Fax:  [phone]  Physical Therapy Treatment  Patient Details  Name: Brian Espinoza MRN: [id number] Date of Birth: [birth year] Referring Provider (PT): Alison MurrayMcLean, Nazier, MD   Encounter Date: 05/30/2021   PT End of Session - 05/30/21 1157     Visit Number 17    Number of Visits 25    Date for PT Re-Evaluation 05/18/21    Authorization Type Medicare A&B; BCBS - 10th visit PN    Progress Note Due on Visit 20    PT Start Time 1155   Pt arrived late   PT Stop Time 1230    PT Time Calculation (min) 35 min    Activity Tolerance Patient tolerated treatment well    Behavior During Therapy [facility] tasks assessed/performed             Past Medical History:  Diagnosis Date   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 No pertinent surgical history.  There were no vitals filed for this visit.   Subjective Assessment - 05/30/21 1157     Subjective Pt denies any new issues. Wife present today as wants to learn how to do the the squat pivots versus Michiel SitesHoyer all the time. She did try with her daughter. They were most challenged getting back in chair.    Patient is accompained by: Family member    Pertinent History Chest pain, dissection of descending thoracic aorta, aneurysm of artery, a-fib, HTN, systolic CHF, oral phase dysphagia, sacral wound, PNA    Patient Stated Goals Improve hand function and walk to be more independent with daily activities.  Gain strength in legs to be able to lift hips and reduce shear on sacrum.    Currently in Pain? No/denies    Pain Onset More than a month ago                               [facility]  Treatment/Exercise - 05/30/21 1159       Bed Mobility   Bed Mobility Sit to Sidelying Left;Left Sidelying to Sit    Left Sidelying to Sit Maximal Assistance - Patient 25-49%   PT assisted to get legs off mat min assist. Trialed having pt push up but unable to get left shoulder under hip enough to push after 2 attempts then max assist to rise. Cues to keep trunk forward   Sit to Sidelying Left Moderate Assistance - Patient 50-74%   to assist BLE on to mat. Pt able to come down on left forearm and lower trunk CGA.     Transfers   Transfers Lateral/Scoot Transfers    Lateral/Scoot Transfers 3: Mod assist;2: [employer] scientistassist    Lateral/Scoot Transfer Details (indicate cue type and reason) Performed w/c to/from mat x 2 with therapist. Mod assist to mat going to right with cues to lean forward and bear weight through legs. Pt able to maintain squat and perform transfer with only one pivot. With return to chair and to left took 3 pivots. Pt's wife present and observing throughout. Instructed her in proper position to allow him to lean over her shoulder opposite the direction he was going  for head/hips ratio. Advised that she would need to ensure he come forward enough to load legs and block left leg. Also suggested that they may need to use slideboard as a bridge if gap larger between w/c and bed at home. Pt's wife performed the transfer mat to/from w/c x 2 with PT stabilizing chair and providing cuing. Had to have her stop to reposition feet at times as he was not coming forward enough and left leg would kick out. Broke task down with her sitting in chair in front just working on having him lean forward to lift bottom slightly x 5 first with therapist to demonstrate then with wife.      Therapeutic Activites    Therapeutic Activities Other Therapeutic Activities    Other Therapeutic Activities Sitting edge of mat: broke down sit to sidelying having pt come down on left forearm on 2 pillows and then push back up.  Performed twice with just left arm then had him come down further and also add in pushing some with right hand x 3. CGA once cued to keep weight forward more.                     PT Education - 05/30/21 1334     Education Details Transfer training with spouse    Person(s) Educated Patient;Spouse    Methods Explanation;Demonstration    Comprehension Returned demonstration;Need further instruction;Verbal cues required              PT Short Term Goals - 05/18/21 1324       PT SHORT TERM GOAL #1   Title = LTG for 30 days 1/22, 60 days 2/21, 90 days 3/23               PT Long Term Goals - 05/18/21 1323       PT LONG TERM GOAL #1   Title Pt and wife will demonstrate independence with progressed HEP (LTG set at 30 days - reset to 60 days 2/21 and 90 days 3/23)    Time 4    Period Weeks    Status Revised    Target Date 06/17/21      PT LONG TERM GOAL #2   Title Pt will demonstrate ability to perform supine <> sit on flat mat with min A    Baseline mod-max A    Time 4    Period Weeks    Status Revised    Target Date 06/17/21      PT LONG TERM GOAL #3   Title Pt will perform squat-scooting transfers w/c <> mat consistently to L and R on level surface with mod A    Baseline mod-max A    Time 4    Period Weeks    Status Revised    Target Date 06/17/21      PT LONG TERM GOAL #4   Title Pt will demonstrate ability to perform sit > stand from elevated mat with mod A, maintain prolonged standing with UE support x 5 minutes and return to sitting with max A for controlled descent    Time 4    Period Weeks    Status Revised    Target Date 06/17/21                   Plan - 05/30/21 1335     Clinical Impression Statement PT session focused on transfer training with pt's wife as she wants to be able to start  doing squat pivots at home. She was able to perform with close supervision and cuing from PT. Will need further practice. Biggest issue was having  pt stay forward enough to prevent kicking left leg out. Pt required less assistance overall with transfer and less scoots to perform.    Comorbidities Chest pain, dissection of descending thoracic aorta, aneurysm of artery, a-fib, HTN, systolic CHF, oral phase dysphagia, sacral wound, PNA    Rehab Potential Good    PT Frequency 3x / week    PT Duration 12 weeks    PT Treatment/Interventions ADLs/Self Care Home Management;DME Instruction;Gait training;Stair training;Functional mobility training;Therapeutic activities;Therapeutic exercise;Balance training;Neuromuscular re-education;Wheelchair mobility training;Orthotic Fit/Training;Patient/family education;Energy conservation    PT Next Visit Plan MONITOR BP: Keep SBP between 120-130! SCI Fit in wheelchair with LE only and belt around thighs.  Continue to work on squat scooting transfers without slideboard.    Closed chain LE strengthening/WB. Bed mobility especially sidelying to sit.  Sit <> stand from elevated mat with 2 person assist to work on graded movement when coming back to sit as well or SARA - gait belt around knees to prevent hip ABD/ER.    Consulted and Agree with Plan of Care Patient             Patient will benefit from skilled therapeutic intervention in order to improve the following deficits and impairments:  Cardiopulmonary status limiting activity, Decreased activity tolerance, Decreased balance, Decreased coordination, Decreased endurance, Decreased mobility, Decreased strength, Difficulty walking, Impaired sensation, Impaired tone, Impaired UE functional use, Postural dysfunction, Pain, Abnormal gait  Visit Diagnosis: Muscle weakness (generalized)  Other abnormalities of gait and mobility     Problem List Patient Active Problem List   Diagnosis Date Noted   Acute on chronic respiratory failure with hypoxia (HCC)    Acute inflammatory demyelinating polyneuropathy (HCC)    Dysautonomia (HCC)    Atrial fibrillation  with RVR ([facility])    Healthcare associated bacterial pneumonia     Ronn Melena, PT, DPT, NCS 05/30/2021, 1:38 PM  [facility] [address] [place], [place], [address] Phone: [phone]   Fax:  [phone]  Name: Anchor Dwan MRN: [id number] Date of Birth: 1956/01/20

## 2021-06-01 ENCOUNTER — Encounter: Payer: Self-pay | Admitting: Occupational Therapy

## 2021-06-01 ENCOUNTER — Other Ambulatory Visit: Payer: Self-pay

## 2021-06-01 ENCOUNTER — Ambulatory Visit: Payer: Medicare Other | Admitting: Occupational Therapy

## 2021-06-01 ENCOUNTER — Ambulatory Visit: Payer: Medicare Other | Admitting: Physical Therapy

## 2021-06-01 DIAGNOSIS — R2689 Other abnormalities of gait and mobility: Secondary | ICD-10-CM

## 2021-06-01 DIAGNOSIS — R278 Other lack of coordination: Secondary | ICD-10-CM | POA: Diagnosis not present

## 2021-06-01 DIAGNOSIS — M6281 Muscle weakness (generalized): Secondary | ICD-10-CM

## 2021-06-01 DIAGNOSIS — R295 Transient paralysis: Secondary | ICD-10-CM

## 2021-06-01 DIAGNOSIS — M25641 Stiffness of right hand, not elsewhere classified: Secondary | ICD-10-CM

## 2021-06-01 DIAGNOSIS — R2681 Unsteadiness on feet: Secondary | ICD-10-CM

## 2021-06-01 DIAGNOSIS — M25642 Stiffness of left hand, not elsewhere classified: Secondary | ICD-10-CM

## 2021-06-01 DIAGNOSIS — R208 Other disturbances of skin sensation: Secondary | ICD-10-CM

## 2021-06-01 NOTE — Therapy (Signed)
[facility] [address] [place], [place], [address] Phone: [phone]   Fax:  [phone]  Physical Therapy Treatment  Patient Details  Name: Brian Espinoza MRN: [id number] Date of Birth: [birth year] Referring Provider (PT): Alison Murray, MD   Encounter Date: 06/01/2021   PT End of Session - 06/01/21 1516     Visit Number 18    Number of Visits 52    Date for PT Re-Evaluation 08/16/21    Authorization Type Medicare A&B; BCBS - 10th visit PN    Progress Note Due on Visit 20    PT Start Time 1400    PT Stop Time 1445    PT Time Calculation (min) 45 min    Equipment Utilized During Treatment Other (comment)   [place] Dec   Activity Tolerance Patient tolerated treatment well    Behavior During Therapy [facility] for tasks assessed/performed             Past Medical History:  Diagnosis Date   Acute inflammatory demyelinating polyneuropathy (HCC)    Acute on chronic respiratory failure with hypoxia (HCC)    Atrial fibrillation with RVR (HCC)    Dysautonomia (HCC)    Healthcare associated bacterial pneumonia     No past surgical history on file.  There were no vitals filed for this visit.   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  Has a CT next week to check the aneurysm; will then have to decide what to do since breathing may be compromised during a major surgery.  Saw wound care yesterday, wound is healing.  May have his new chair next week.  Didn't try the transfer at home because he wants his daughter to be with his wife.    Patient is accompained by: Family member    Pertinent History Chest pain, dissection of descending thoracic aorta, aneurysm of artery, a-fib, HTN, systolic CHF, oral phase dysphagia, sacral wound, PNA    Patient Stated Goals Improve hand function and walk to be more independent with daily activities.  Gain  strength in legs to be able to lift hips and reduce shear on sacrum.    Currently in Pain? No/denies    Pain Onset More than a month ago              [facility] Adult PT Treatment/Exercise - 06/01/21 1456       Transfers   Transfers Squat Pivot Transfers;Sit to Stand;Stand to Sit    Sit to Stand 1: +2 Total assist;With upper extremity assist    Sit to Stand Details (indicate cue type and reason) Pt set up in [place] with walking harness and straps around UE to maintain placement; removed foot plate; kept knee plate.  Performed from elevated mat with therapist only assisting with maintaining foot placement but no strap needed to keep hips in neutral alignment and pt did not push knees into knee pad when standing.  Pt also able to stand upright without hanging in sling.  Good WB through UE without collapsing in shoulders and feet remained stable on floor.  Performed x 3 reps.    Stand to Sit 1: +2 Total assist    Stand to Sit Details cued pt to "sit hips back" when preparing to sit; pt able to sit with greater control in LE and did not experience sudden knee flexion or dropping back down on to the  mat    Squat Pivot Transfers 2: Max Probation officerassist    Squat Pivot Transfer Details (indicate cue type and reason) Performed elevated mat > w/c without slideboard with therapist in front of patient to monitor LE position, assist with anterior lean and weight shift through BOS, pt demonstrating faster initiation of sit > squat; required assistance from PT to maintain squat and perform pivot to chair; required 3 attempts    Comments Once in standing performed lateral weight shifting and focusing on maintaining hip and knee extension during weight shift.  Also performed trial of lifting each heel and then trial of advancing each foot.  Pt able to advance R foot forwards and back but required second PT assistance to control LE advancement and placement of foot, PT also assisted with toe clearance.  Pt unable to advance LLE.               PT Education - 06/01/21 1515     Education Details will likely need bilat AFO to begin gait training    Person(s) Educated Patient    Methods Explanation    Comprehension Verbalized understanding              PT Short Term Goals - 05/18/21 1324       PT SHORT TERM GOAL #1   Title = LTG for 30 days 1/22, 60 days 2/21, 90 days 3/23               PT Long Term Goals - 05/18/21 1323       PT LONG TERM GOAL #1   Title Pt and wife will demonstrate independence with progressed HEP (LTG set at 30 days - reset to 60 days 2/21 and 90 days 3/23)    Time 4    Period Weeks    Status Revised    Target Date 06/17/21      PT LONG TERM GOAL #2   Title Pt will demonstrate ability to perform supine <> sit on flat mat with min A    Baseline mod-max A    Time 4    Period Weeks    Status Revised    Target Date 06/17/21      PT LONG TERM GOAL #3   Title Pt will perform squat-scooting transfers w/c <> mat consistently to L and R on level surface with mod A    Baseline mod-max A    Time 4    Period Weeks    Status Revised    Target Date 06/17/21      PT LONG TERM GOAL #4   Title Pt will demonstrate ability to perform sit > stand from elevated mat with mod A, maintain prolonged standing with UE support x 5 minutes and return to sitting with max A for controlled descent    Time 4    Period Weeks    Status Revised    Target Date 06/17/21                   Plan - 06/01/21 1517     Clinical Impression Statement Pt demonstrated significant progress with sit <> stand, static standing, and squat pivot transfers today.  Pt able to perform sustained standing in SARA with decreased assistance required to maintain UE and LE placement; pt able to begin to advance RLE but will likely require bilat AFO for foot clearance when gait training initiated.  Pt also demonstrated improved initiation of squat today.    Comorbidities Chest pain,  dissection of descending  thoracic aorta, aneurysm of artery, a-fib, HTN, systolic CHF, oral phase dysphagia, sacral wound, PNA    Rehab Potential Good    PT Frequency 3x / week    PT Duration 12 weeks    PT Treatment/Interventions ADLs/Self Care Home Management;DME Instruction;Gait training;Stair training;Functional mobility training;Therapeutic activities;Therapeutic exercise;Balance training;Neuromuscular re-education;Wheelchair mobility training;Orthotic Fit/Training;Patient/family education;Energy conservation    PT Next Visit Plan MONITOR BP: Keep SBP between 120-130! SCI Fit in wheelchair with LE only and belt around thighs.  Squat scooting w/c <> mat without slideboard.  Trial wearing anterior GRAFOs when performing standing in SARA; Sit <> stand with SARA and walking harness; work on advancing each foot forwards and back.  Continue transfer training with wife to move away from use of hoyer.    Consulted and Agree with Plan of Care Patient             Patient will benefit from skilled therapeutic intervention in order to improve the following deficits and impairments:  Cardiopulmonary status limiting activity, Decreased activity tolerance, Decreased balance, Decreased coordination, Decreased endurance, Decreased mobility, Decreased strength, Difficulty walking, Impaired sensation, Impaired tone, Impaired UE functional use, Postural dysfunction, Pain, Abnormal gait  Visit Diagnosis: Muscle weakness (generalized)  Other lack of coordination  Unsteadiness on feet  Other disturbances of skin sensation  Other abnormalities of gait and mobility  Transient paralysis     Problem List Patient Active Problem List   Diagnosis Date Noted   Acute on chronic respiratory failure with hypoxia (HCC)    Acute inflammatory demyelinating polyneuropathy (HCC)    Dysautonomia (HCC)    Atrial fibrillation with RVR (HCC)    Healthcare associated bacterial pneumonia     Dierdre Highman, PT, DPT 06/01/21    3:22  PM    [facility] [address] [place], [place], [address] Phone: [phone]   Fax:  [phone]  Name: Brian Espinoza MRN: [id number] Date of Birth: 04-Dec-1955

## 2021-06-01 NOTE — Therapy (Signed)
[facility] [address] [place], [place], [address] Phone: [phone]   Fax:  [phone]  Occupational Therapy Treatment  Patient Details  Name: Brian Espinoza MRN: [id number] Date of Birth: 10-24-1955 Referring Provider (OT): Aundra Dubin (Will send certification to PCP [place])   Encounter Date: 06/01/2021   OT End of Session - 06/01/21 1435     Visit Number 18    Number of Visits 37    Date for OT Re-Evaluation 06/20/21    Authorization Type Medicare and Federal BCBS    Progress Note Due on Visit 20    OT Start Time 1315    OT Stop Time 1400    OT Time Calculation (min) 45 min    Activity Tolerance Patient tolerated treatment well    Behavior During Therapy WFL for tasks assessed/performed             Past Medical History:  Diagnosis Date   Acute inflammatory demyelinating polyneuropathy ([place])    Acute on chronic respiratory failure with hypoxia (HCC)    Atrial fibrillation with RVR ([place])    Dysautonomia ([place])    Healthcare associated bacterial pneumonia     History reviewed. No pertinent surgical history.  There were no vitals filed for this visit.   Subjective Assessment - 06/01/21 1322     Subjective  "I have to have an aneurysm repaired and my vocal cords repaired. ... I feel like my hands aren't coming back and they haven't gotten any better"    Currently in Pain? No/denies    Pain Score 0-No pain             Sliding Board Transfer with min/mod assistance for leg management and positioning and stability to left side to edge of mat from tile in space w/c.  Sitting Balance with stand by assistance for duration of therapy session. Pt worked on dynamic sitting balance with reaching to floor with SBA.   BUE intrinsic strengthening with blocking IP s on BUE with MP flexion x 10 reps each, palmar abduction strengthening BUE with rotating thumb over cone x 5 reps each  with LUE < RUE.                      OT Short Term Goals - 05/30/21 1353       OT SHORT TERM GOAL #1   Title Patient and caregiver will complete a home exercise program designed to improve strength in proximal UE's - shoulders and elbows    Time 4    Period Weeks    Status Achieved   issued AROM exercises and gentle theraband tricep strengthening 04/30/21   Target Date 04/21/21      OT SHORT TERM GOAL #2   Title Following set up patient will hold a cup with lid and long straw in midline to allow him to take a sip on his own    Time 4    Period Weeks    Status Partially Met   completed with simulation in clinic on 04/30/21 - continue to assess for consistency     OT SHORT TERM GOAL #3   Title While seated in upright supported position, patient will reach forward to make contact with upright target on table top (chest height) x 5 each arm    Time 4    Period Weeks    Status Achieved      OT SHORT TERM GOAL #4  Title Patient will be able to reach toward face with either right or left hand in preparation to scratch chin/cheek    Time 4    Period Weeks    Status Achieved      OT SHORT TERM GOAL #5   Title Patient will utilize cylindrical grip to hold small water bottle in one hand.    Time 4    Period Weeks    Status Partially Met   demonstrated holding bottle in RUE however not consistently 04/30/21              OT Long Term Goals - 05/30/21 1354       OT LONG TERM GOAL #1   Title Patient will complete an updated stretching program to prevent contracture in digits    Time 12    Period Weeks    Status On-going      OT LONG TERM GOAL #2   Title Patient and caregiver will demonstrate awareness of splint wear and care for BUE    Time 12    Period Weeks    Status On-going      OT LONG TERM GOAL #3   Title Patient will wash his face and upper body and upper legs with min assist    Time 12    Period Weeks    Status On-going      OT LONG TERM GOAL  #4   Title Patient will actively bridge in bed to assist with pericare as needed    Time 12    Period Weeks    Status On-going      OT LONG TERM GOAL #5   Title Patient will transfer to adapted commode with max assist in prep for toileting    Time 12    Period Weeks    Status On-going      OT LONG TERM GOAL #6   Title Patient will transfer into/ out of shower with mod assist, and min assist for seated control during shower    Time 12    Period Weeks    Status On-going                   Plan - 06/01/21 1438     Clinical Impression Statement Pt has demonstrated significant progress with postural control and trunk control with sitting balance and sliding board transfer. Pt eager to have return of functional use of BUE.    OT Occupational Profile and History Comprehensive Assessment- Review of records and extensive additional review of physical, cognitive, psychosocial history related to current functional performance    Occupational performance deficits (Please refer to evaluation for details): ADL's;IADL's;Rest and Sleep;Leisure    Body Structure / Function / Physical Skills ADL;Decreased knowledge of use of DME;Strength;Balance;Dexterity;GMC;Pain;Tone;Body mechanics;Proprioception;UE functional use;Cardiopulmonary status limiting activity;Endurance;IADL;ROM;Fascial restriction;Improper spinal/pelvic alignment;Coordination;Flexibility;Mobility;Sensation;Wound;FMC;Muscle spasms;Skin integrity    Clinical Decision Making Multiple treatment options, significant modification of task necessary    Comorbidities Affecting Occupational Performance: Presence of comorbidities impacting occupational performance    Comorbidities impacting occupational performance description: AIDP, Chronic back pain, Descending thoracic aortic anneurysm    Modification or Assistance to Complete Evaluation  Min-Moderate modification of tasks or assist with assess necessary to complete eval    OT Frequency 3x  / week    OT Duration 12 weeks    OT Treatment/Interventions Self-care/ADL training;Moist Heat;Fluidtherapy;DME and/or AE instruction;Splinting;Balance training;Aquatic Therapy;Contrast Bath;Therapeutic activities;Ultrasound;Therapeutic exercise;Passive range of motion;Functional Mobility Training;Neuromuscular education;Electrical Stimulation;Manual Therapy;Patient/family education;Energy conservation    Plan continue progressing towards  functional use with BUE, transfers (SB), continue BUE strengthening and coordination    Consulted and Agree with Plan of Care Patient;Family member/caregiver    Family Member Consulted wife Lattie Haw             Patient will benefit from skilled therapeutic intervention in order to improve the following deficits and impairments:   Body Structure / Function / Physical Skills: ADL, Decreased knowledge of use of DME, Strength, Balance, Dexterity, GMC, Pain, Tone, Body mechanics, Proprioception, UE functional use, Cardiopulmonary status limiting activity, Endurance, IADL, ROM, Fascial restriction, Improper spinal/pelvic alignment, Coordination, Flexibility, Mobility, Sensation, Wound, FMC, Muscle spasms, Skin integrity       Visit Diagnosis: Muscle weakness (generalized)  Other lack of coordination  Unsteadiness on feet  Stiffness of left hand, not elsewhere classified  Other disturbances of skin sensation  Stiffness of right hand, not elsewhere classified    Problem List Patient Active Problem List   Diagnosis Date Noted   Acute on chronic respiratory failure with hypoxia (HCC)    Acute inflammatory demyelinating polyneuropathy ([place])    Dysautonomia ([place])    Atrial fibrillation with RVR [facility])    Healthcare associated bacterial pneumonia     Zachery Conch, OT 06/01/2021, 2:39 PM  [facility] [address] [place], [place], [address] Phone: [phone]   Fax:   [phone]  Name: Enos Muhl MRN: [id number] Date of Birth: [birth year]

## 2021-06-04 ENCOUNTER — Ambulatory Visit: Payer: Medicare Other | Admitting: Occupational Therapy

## 2021-06-04 ENCOUNTER — Ambulatory Visit: Payer: Medicare Other | Admitting: Physical Therapy

## 2021-06-04 ENCOUNTER — Other Ambulatory Visit: Payer: Self-pay

## 2021-06-04 DIAGNOSIS — R295 Transient paralysis: Secondary | ICD-10-CM

## 2021-06-04 DIAGNOSIS — R278 Other lack of coordination: Secondary | ICD-10-CM | POA: Diagnosis not present

## 2021-06-04 DIAGNOSIS — R2681 Unsteadiness on feet: Secondary | ICD-10-CM

## 2021-06-04 DIAGNOSIS — R2689 Other abnormalities of gait and mobility: Secondary | ICD-10-CM

## 2021-06-04 DIAGNOSIS — R208 Other disturbances of skin sensation: Secondary | ICD-10-CM

## 2021-06-04 DIAGNOSIS — M6281 Muscle weakness (generalized): Secondary | ICD-10-CM

## 2021-06-04 NOTE — Patient Instructions (Signed)
Has resting night splints Brought in two different day splints to help with picking up objects - white molded and black off the shelf - consulted OT who recommended pt wear black splints during the day when working on picking up objects.

## 2021-06-04 NOTE — Therapy (Signed)
[facility] [address] [place], [place], [address] Phone: [phone]   Fax:  [phone]  Physical Therapy Treatment  Patient Details  Name: Brian Espinoza MRN: [id number] Date of Birth: 1956-01-25 Referring Provider (PT): Alison Murray, MD   Encounter Date: 06/04/2021   PT End of Session - 06/04/21 2059     Visit Number 19    Number of Visits 52    Date for PT Re-Evaluation 08/16/21    Authorization Type Medicare A&B; BCBS - 10th visit PN    Progress Note Due on Visit 20    PT Start Time 1445    PT Stop Time 1530    PT Time Calculation (min) 45 min    Equipment Utilized During Treatment Other (comment)   [place] Dec,  bilat GRAFO   Activity Tolerance Patient tolerated treatment well    Behavior During Therapy [facility] for tasks assessed/performed             Past Medical History:  Diagnosis Date   Acute inflammatory demyelinating polyneuropathy (HCC)    Acute on chronic respiratory failure with hypoxia (HCC)    Atrial fibrillation with RVR (HCC)    Dysautonomia (HCC)    Healthcare associated bacterial pneumonia     No past surgical history on file.  There were no vitals filed for this visit.   Subjective Assessment - 06/04/21 1452     Subjective Transportation picked up patient late; missed OT today.  Not able to reschedule OT today.  Pt did slideboard at home with daughter and wife, went well and then with just wife - daughter supervising, "we made it."  Felt good after last session.    Patient is accompained by: Family member    Pertinent History Chest pain, dissection of descending thoracic aorta, aneurysm of artery, a-fib, HTN, systolic CHF, oral phase dysphagia, sacral wound, PNA    Patient Stated Goals Improve hand function and walk to be more independent with daily activities.  Gain strength in legs to be able to lift hips and reduce shear on sacrum.    Currently in Pain? No/denies    Pain Onset More than a  month ago               [facility] Adult PT Treatment/Exercise - 06/04/21 2043       Transfers   Transfers Squat Pivot Transfers;Sit to Stand;Stand to Sit    Sit to Stand 1: +2 Total assist;With upper extremity assist;From elevated surface;Other (comment)    Sit to Stand Details (indicate cue type and reason) after donning bilat trial GRAFOs, set up in SARA with UE strapped to maintain position and WB through UE.  Performed sit > stand with pt demonstrating significant initiation of hip and knee extension; once standing pt did not demonstrate any genu recurvatum or significant knee buckling with bilat GRAFOs donned.    Stand to Sit 1: +2 Total assist    Stand to Sit Details continued to provide cues to "reach back with your hips" and "sit your hips back" prior to sitting; pt demonstrated significant control of hip and knee flexion when descending today.    Squat Pivot Transfers 3: Mod assist;2: Max Probation officer Details (indicate cue type and reason) Performed w/c <> mat with therapist providing mod-max A depending on patient's ability to maintain trunk lean and weight shift forwards over BOS when initiating sit > squat.  Pt able to initiate 50% of the time but continues  to require significant assistance to maintain forward weight shift and to initiate pivoting.    Comments With GRAFOs donned continued to perform lateral weight shifting in standing with decreased episodes of knee buckling during stance phase.  Pt able to unweight and move RLE in small increments without assistance from PT.  Continues to require assistance to initiate movement of LLE even with GRAFOs on.      Therapeutic Activites    Therapeutic Activities Other Therapeutic Activities    Other Therapeutic Activities Discussed AFO options and rationale for use of Ground Reaction AFO bilaterally for foot clearance and some knee stabilization in stance.  Trial of [place] high GRAFO (Larger size) on R and LLE.                        PT Short Term Goals - 05/18/21 1324       PT SHORT TERM GOAL #1   Title = LTG for 30 days 1/22, 60 days 2/21, 90 days 3/23               PT Long Term Goals - 05/18/21 1323       PT LONG TERM GOAL #1   Title Pt and wife will demonstrate independence with progressed HEP (LTG set at 30 days - reset to 60 days 2/21 and 90 days 3/23)    Time 4    Period Weeks    Status Revised    Target Date 06/17/21      PT LONG TERM GOAL #2   Title Pt will demonstrate ability to perform supine <> sit on flat mat with min A    Baseline mod-max A    Time 4    Period Weeks    Status Revised    Target Date 06/17/21      PT LONG TERM GOAL #3   Title Pt will perform squat-scooting transfers w/c <> mat consistently to L and R on level surface with mod A    Baseline mod-max A    Time 4    Period Weeks    Status Revised    Target Date 06/17/21      PT LONG TERM GOAL #4   Title Pt will demonstrate ability to perform sit > stand from elevated mat with mod A, maintain prolonged standing with UE support x 5 minutes and return to sitting with max A for controlled descent    Time 4    Period Weeks    Status Revised    Target Date 06/17/21                   Plan - 06/04/21 2101     Clinical Impression Statement Began to assess pt for most appropriate AFO to utilize during supported standing.  Pt will most likely benefit from anterior GRAFOs bilaterally.  Pt demonstrated significantly improved LE alignment and knee control when standing in SARA today.  Will continue to utilize and continue to work towards beginning to ambulate with GRAFOs.    Comorbidities Chest pain, dissection of descending thoracic aorta, aneurysm of artery, a-fib, HTN, systolic CHF, oral phase dysphagia, sacral wound, PNA    Rehab Potential Good    PT Frequency 3x / week    PT Duration 12 weeks    PT Treatment/Interventions ADLs/Self Care Home Management;DME Instruction;Gait training;Stair  training;Functional mobility training;Therapeutic activities;Therapeutic exercise;Balance training;Neuromuscular re-education;Wheelchair mobility training;Orthotic Fit/Training;Patient/family education;Energy conservation    PT Next Visit Plan 10th Visit Progress Note.  MONITOR BP: Keep SBP between 120-130! SCI Fit in wheelchair with LE only and belt around thighs.  Squat scooting w/c <> mat without slideboard.  Use bilat [place] high GRAFOs when performing standing in SARA with blue walking harness; work on advancing each foot forwards and back.  Continue transfer training with wife to move away from use of hoyer.    Consulted and Agree with Plan of Care Patient             Patient will benefit from skilled therapeutic intervention in order to improve the following deficits and impairments:  Cardiopulmonary status limiting activity, Decreased activity tolerance, Decreased balance, Decreased coordination, Decreased endurance, Decreased mobility, Decreased strength, Difficulty walking, Impaired sensation, Impaired tone, Impaired UE functional use, Postural dysfunction, Pain, Abnormal gait  Visit Diagnosis: Muscle weakness (generalized)  Transient paralysis  Other disturbances of skin sensation  Unsteadiness on feet  Other abnormalities of gait and mobility     Problem List Patient Active Problem List   Diagnosis Date Noted   Acute on chronic respiratory failure with hypoxia (HCC)    Acute inflammatory demyelinating polyneuropathy (HCC)    Dysautonomia (HCC)    Atrial fibrillation with RVR (HCC)    Healthcare associated bacterial pneumonia     Dierdre HighmanAudra F Hawke Villalpando, PT, DPT 06/04/21    9:06 PM   [facility] [address] [place], [place], [address] Phone: [phone]   Fax:  [phone]  Name: Raymondo BandJames Wenrich MRN: [id number] Date of Birth: [birth year]

## 2021-06-06 ENCOUNTER — Ambulatory Visit: Payer: Medicare Other | Admitting: Occupational Therapy

## 2021-06-06 ENCOUNTER — Encounter: Payer: Self-pay | Admitting: Occupational Therapy

## 2021-06-06 ENCOUNTER — Other Ambulatory Visit: Payer: Self-pay

## 2021-06-06 ENCOUNTER — Encounter: Payer: Self-pay | Admitting: Rehabilitation

## 2021-06-06 ENCOUNTER — Ambulatory Visit: Payer: Medicare Other | Admitting: Rehabilitation

## 2021-06-06 DIAGNOSIS — R2681 Unsteadiness on feet: Secondary | ICD-10-CM

## 2021-06-06 DIAGNOSIS — M6281 Muscle weakness (generalized): Secondary | ICD-10-CM

## 2021-06-06 DIAGNOSIS — M25642 Stiffness of left hand, not elsewhere classified: Secondary | ICD-10-CM

## 2021-06-06 DIAGNOSIS — R278 Other lack of coordination: Secondary | ICD-10-CM

## 2021-06-06 DIAGNOSIS — M25641 Stiffness of right hand, not elsewhere classified: Secondary | ICD-10-CM

## 2021-06-06 DIAGNOSIS — R208 Other disturbances of skin sensation: Secondary | ICD-10-CM

## 2021-06-06 DIAGNOSIS — R2689 Other abnormalities of gait and mobility: Secondary | ICD-10-CM

## 2021-06-06 DIAGNOSIS — R293 Abnormal posture: Secondary | ICD-10-CM

## 2021-06-06 NOTE — Therapy (Signed)
[facility] [address] [place], [place], [address] Phone: [phone]   Fax:  [phone]  Occupational Therapy Treatment  Patient Details  Name: Brian Espinoza MRN: [id number] Date of Birth: 07-Apr-1956 Referring Provider (OT): Aundra Dubin (Will send certification to PCP [place])   Encounter Date: 06/06/2021   OT End of Session - 06/06/21 1216     Visit Number 19    Number of Visits 37    Date for OT Re-Evaluation 06/20/21    Authorization Type Medicare and Federal BCBS    Progress Note Due on Visit 20    OT Start Time 1115    OT Stop Time 1150    OT Time Calculation (min) 35 min    Activity Tolerance Patient tolerated treatment well    Behavior During Therapy WFL for tasks assessed/performed             Past Medical History:  Diagnosis Date   Acute inflammatory demyelinating polyneuropathy ([place])    Acute on chronic respiratory failure with hypoxia ([facility])    Atrial fibrillation with RVR ([place])    Dysautonomia ([place])    Healthcare associated bacterial pneumonia     History reviewed. No pertinent surgical history.  There were no vitals filed for this visit.   Subjective Assessment - 06/06/21 1154     Subjective  My wife and I have been transferring. We got it!    Currently in Pain? No/denies    Pain Score 0-No pain                          OT Treatments/Exercises (OP) - 06/06/21 1157       ADLs   Eating Patient asked for glass of water.  Assisted patient to wrap right hand around cup and use left hand underneath for support, then patient able to bring cup to mouth with help only to manage orientation of straw.  Encourgaed patient to use a cup with a lid and straw - not to fill it to reduce weight, and practice hand to mouth pattern.      Exercises   Exercises Elbow      Elbow Exercises   Other elbow exercises Used Active Hand support to wrap 2lb dumbbell  in right hand for elbow curls.  Patient needed cueing to maintain active neutral wrist position and control end range of motion.  Patient able to complete 6 reps - goal 10.  When attempting to put on active hand support to left hand noted small skin tear over large forearm bruise.  Skin tear cleaned, and bandaid applied.  Patient reports bruising arm on bedrail - but skin tear occured he thinks inprior session with use of UpWalker - when transitioning from stand to sit.      Neurological Re-education Exercises   Other Exercises 1 Assisted PT with functional mobility session.  Worked on sit to stand, standing weight shifts, and stand to sit with use of body weight support, and Upwalker.  Patient with limited ability to grade LE activation - either on  or off although with cueing and facilitation for alignment - patient showing improved control.                      OT Short Term Goals - 06/06/21 1220       OT SHORT TERM GOAL #1   Title Patient and caregiver will complete a home  exercise program designed to improve strength in proximal UE's - shoulders and elbows    Time 4    Period Weeks    Status Achieved   issued AROM exercises and gentle theraband tricep strengthening 04/30/21   Target Date 04/21/21      OT SHORT TERM GOAL #2   Title Following set up patient will hold a cup with lid and long straw in midline to allow him to take a sip on his own    Time 4    Period Weeks    Status Partially Met   completed with simulation in clinic on 04/30/21 - continue to assess for consistency     OT SHORT TERM GOAL #3   Title While seated in upright supported position, patient will reach forward to make contact with upright target on table top (chest height) x 5 each arm    Time 4    Period Weeks    Status Achieved      OT SHORT TERM GOAL #4   Title Patient will be able to reach toward face with either right or left hand in preparation to scratch chin/cheek    Time 4    Period Weeks     Status Achieved      OT SHORT TERM GOAL #5   Title Patient will utilize cylindrical grip to hold small water bottle in one hand.    Time 4    Period Weeks    Status Partially Met   demonstrated holding bottle in RUE however not consistently 04/30/21              OT Long Term Goals - 06/06/21 1221       OT LONG TERM GOAL #1   Title Patient will complete an updated stretching program to prevent contracture in digits    Time 12    Period Weeks    Status On-going      OT LONG TERM GOAL #2   Title Patient and caregiver will demonstrate awareness of splint wear and care for BUE    Time 12    Period Weeks    Status On-going      OT LONG TERM GOAL #3   Title Patient will wash his face and upper body and upper legs with min assist    Time 12    Period Weeks    Status On-going      OT LONG TERM GOAL #4   Title Patient will actively bridge in bed to assist with pericare as needed    Time 12    Period Weeks    Status On-going      OT LONG TERM GOAL #5   Title Patient will transfer to adapted commode with max assist in prep for toileting    Time 12    Period Weeks    Status On-going      OT LONG TERM GOAL #6   Title Patient will transfer into/ out of shower with mod assist, and min assist for seated control during shower    Time 12    Period Weeks    Status On-going                   Plan - 06/06/21 1216     Clinical Impression Statement Patient continues to show improved postural control and strength.  He reports improving transition to functional trasnfers at home.  Patient has received his new wheelchair, and reports improved pressure on sacral wound.  OT Occupational Profile and History Comprehensive Assessment- Review of records and extensive additional review of physical, cognitive, psychosocial history related to current functional performance    Occupational performance deficits (Please refer to evaluation for details): ADL's;IADL's;Rest and  Sleep;Leisure    Body Structure / Function / Physical Skills ADL;Decreased knowledge of use of DME;Strength;Balance;Dexterity;GMC;Pain;Tone;Body mechanics;Proprioception;UE functional use;Cardiopulmonary status limiting activity;Endurance;IADL;ROM;Fascial restriction;Improper spinal/pelvic alignment;Coordination;Flexibility;Mobility;Sensation;Wound;FMC;Muscle spasms;Skin integrity    Rehab Potential Good    Clinical Decision Making Multiple treatment options, significant modification of task necessary    Comorbidities Affecting Occupational Performance: Presence of comorbidities impacting occupational performance    Comorbidities impacting occupational performance description: AIDP, Chronic back pain, Descending thoracic aortic anneurysm    Modification or Assistance to Complete Evaluation  Min-Moderate modification of tasks or assist with assess necessary to complete eval    OT Frequency 3x / week    OT Duration 12 weeks    OT Treatment/Interventions Self-care/ADL training;Moist Heat;Fluidtherapy;DME and/or AE instruction;Splinting;Balance training;Aquatic Therapy;Contrast Bath;Therapeutic activities;Ultrasound;Therapeutic exercise;Passive range of motion;Functional Mobility Training;Neuromuscular education;Electrical Stimulation;Manual Therapy;Patient/family education;Energy conservation    Plan Progress Note!  Continue strength / coordination UE's functional use of UE's, transfers    Consulted and Agree with Plan of Care Patient             Patient will benefit from skilled therapeutic intervention in order to improve the following deficits and impairments:   Body Structure / Function / Physical Skills: ADL, Decreased knowledge of use of DME, Strength, Balance, Dexterity, GMC, Pain, Tone, Body mechanics, Proprioception, UE functional use, Cardiopulmonary status limiting activity, Endurance, IADL, ROM, Fascial restriction, Improper spinal/pelvic alignment, Coordination, Flexibility, Mobility,  Sensation, Wound, FMC, Muscle spasms, Skin integrity       Visit Diagnosis: Muscle weakness (generalized)  Stiffness of right hand, not elsewhere classified  Stiffness of left hand, not elsewhere classified  Abnormal posture  Unsteadiness on feet  Other disturbances of skin sensation  Other lack of coordination    Problem List Patient Active Problem List   Diagnosis Date Noted   Acute on chronic respiratory failure with hypoxia (HCC)    Acute inflammatory demyelinating polyneuropathy ([place])    Dysautonomia ([place])    Atrial fibrillation with RVR ([place])    Healthcare associated bacterial pneumonia     Mariah Milling, OT 06/06/2021, 12:22 PM  [facility] [address] [place], [place], [address] Phone: [phone]   Fax:  [phone]  Name: Armstrong Creasy MRN: [id number] Date of Birth: [birth year]

## 2021-06-06 NOTE — Therapy (Signed)
[facility] [address] [place], [place], [address] Phone: [phone]   Fax:  [phone]  Physical Therapy Treatment and Progress Note  Patient Details  Name: Brian Espinoza MRN: [id number] Date of Birth: 1955-06-23 Referring Provider (PT): Alison MurrayMcLean, Sawyer, MD   Encounter Date: 06/06/2021   PT End of Session - 06/06/21 1259     Visit Number 20    Number of Visits 52    Date for PT Re-Evaluation 08/16/21    Authorization Type Medicare A&B; BCBS - 10th visit PN    Progress Note Due on Visit 30    PT Start Time 1019    PT Stop Time 1115    PT Time Calculation (min) 56 min    Equipment Utilized During Treatment Other (comment)   [place]ley DecSara,  bilat GRAFO   Activity Tolerance Patient tolerated treatment well    Behavior During Therapy [facility] for tasks assessed/performed             Past Medical History:  Diagnosis Date   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 No pertinent surgical history.  There were no vitals filed for this visit.   Subjective Assessment - 06/06/21 1136     Subjective Pt reports doing well, transfers with slideboard are going well at home!!    Pertinent History Chest pain, dissection of descending thoracic aorta, aneurysm of artery, a-fib, HTN, systolic CHF, oral phase dysphagia, sacral wound, PNA    Patient Stated Goals Improve hand function and walk to be more independent with daily activities.  Gain strength in legs to be able to lift hips and reduce shear on sacrum.    Currently in Pain? No/denies                               [facility] Adult PT Treatment/Exercise - 06/06/21 0001       Bed Mobility   Bed Mobility Sit to Sidelying Left;Supine to Sit    Supine to Sit Moderate  Assistance - Patient 50-74%    Sit to Sidelying Left Moderate Assistance - Patient 50-74%      Transfers   Transfers Squat Pivot Transfers;Sit to Stand;Stand to Sit    Sit to Stand 1: +2 Total assist;With upper extremity assist;From elevated surface;Other (comment)    Sit to Stand Details Verbal cues for technique    Stand to Sit 1: +2 Total assist    Squat Pivot Transfers 3: Mod assist    Squat Pivot Transfer Details (indicate cue type and reason) w/c>mat with B AFOs donned to allow feet to remain better planted on floor during transfer.  R foot did come up during transfer out of shoe and needed to be replaced once on mat.  Pt did very well coming forward today and "pushing" through LEs.  Continues to need assist for the pivot portion of transfer, but overall seems better able to use LEs during transfer.    Comments All standing with GRAFOs donned and with use of Biodex body weight support and Upwalker anterior to pt for UE support and +2A to maintain trunk control and BLE control.  (Third person to assist with placement/control of upwalker).  Once in standing worked on lateral weight shifting, improving hip  extension (pelvic protraction) without trunk extension.  Performed lateral weight shifting with tapping/slightly moving unweighted LE.  Cues and tactile assist for relaxed head throughout.  Then attempted to take forward steps.  He needs assist with placement for BLEs as he tends to scissor heavily.  Also needs intermittently heavy assist to prevent knee buckle (both with standing and stepping).  Note that upwalker tends to keep his trunk too far forward and not allow for proper alignment which I think contributed to brace being so painful on his ankle (despite padding and tightening shoes).  Pt needs max cues for controlled slow movement as he tends to be "all or none" with activation.  In standing, he was able to perform mini squats x 10 reps with verbal and tactile cues for slow controlled movement  esp at knees as he tends to forcefully lock out R knee. He did have one instance of fully buckling and needing chair to be placed behind him to sit quickly.                       PT Short Term Goals - 05/18/21 1324       PT SHORT TERM GOAL #1   Title = LTG for 30 days 1/22, 60 days 2/21, 90 days 3/23               PT Long Term Goals - 05/18/21 1323       PT LONG TERM GOAL #1   Title Pt and wife will demonstrate independence with progressed HEP (LTG set at 30 days - reset to 60 days 2/21 and 90 days 3/23)    Time 4    Period Weeks    Status Revised    Target Date 06/17/21      PT LONG TERM GOAL #2   Title Pt will demonstrate ability to perform supine <> sit on flat mat with min A    Baseline mod-max A    Time 4    Period Weeks    Status Revised    Target Date 06/17/21      PT LONG TERM GOAL #3   Title Pt will perform squat-scooting transfers w/c <> mat consistently to L and R on level surface with mod A    Baseline mod-max A    Time 4    Period Weeks    Status Revised    Target Date 06/17/21      PT LONG TERM GOAL #4   Title Pt will demonstrate ability to perform sit > stand from elevated mat with mod A, maintain prolonged standing with UE support x 5 minutes and return to sitting with max A for controlled descent    Time 4    Period Weeks    Status Revised    Target Date 06/17/21             Progress Note Reporting Period 04/27/21 to 06/06/21  See note below for Objective Data and Assessment of Progress/Goals.          Plan - 06/06/21 1300     Clinical Impression Statement Skilled session focused on improving trunk, proximal and LE control with functional transfers, sit<>stand and standing with use of body weight support system.  Pt progressing very well with transfers and sit<>stand, but is activation is fleeting in standing with either full activation or buckling.    Comorbidities Chest pain, dissection of descending thoracic aorta,  aneurysm of artery, a-fib, HTN, systolic CHF,  oral phase dysphagia, sacral wound, PNA    Rehab Potential Good    PT Frequency 3x / week    PT Duration 12 weeks    PT Treatment/Interventions ADLs/Self Care Home Management;DME Instruction;Gait training;Stair training;Functional mobility training;Therapeutic activities;Therapeutic exercise;Balance training;Neuromuscular re-education;Wheelchair mobility training;Orthotic Fit/Training;Patient/family education;Energy conservation    PT Next Visit Plan Standing was ALOT with body weight support, I wonder how he would do in tall kneeling to take lower legs out of picture to work proximally and on trunk?? Still would need a lot of help! MONITOR BP: Keep SBP between 120-130! SCI Fit in wheelchair with LE only and belt around thighs.  Squat scooting w/c <> mat without slideboard.  Use bilat [place] high GRAFOs when performing standing in SARA with blue walking harness; work on advancing each foot forwards and back.  Continue transfer training with wife to move away from use of hoyer.    Consulted and Agree with Plan of Care Patient             Patient will benefit from skilled therapeutic intervention in order to improve the following deficits and impairments:  Cardiopulmonary status limiting activity, Decreased activity tolerance, Decreased balance, Decreased coordination, Decreased endurance, Decreased mobility, Decreased strength, Difficulty walking, Impaired sensation, Impaired tone, Impaired UE functional use, Postural dysfunction, Pain, Abnormal gait  Visit Diagnosis: Muscle weakness (generalized)  Abnormal posture  Unsteadiness on feet  Other abnormalities of gait and mobility  Other lack of coordination     Problem List Patient Active Problem List   Diagnosis Date Noted   Acute on chronic respiratory failure with hypoxia (HCC)    Acute inflammatory demyelinating polyneuropathy (HCC)    Dysautonomia ([facility])    Atrial fibrillation with  RVR ([facility])    Healthcare associated bacterial pneumonia     [place]ari[place]ula Krist, Meribeth Mattes, PT 06/06/2021, 1:04 PM  [facility] [address] [place], [place], [address] Phone: [phone]   Fax:  [phone]  Name: Brian Espinoza MRN: [id number] Date of Birth: 1955-12-26

## 2021-06-08 ENCOUNTER — Ambulatory Visit: Payer: Medicare Other | Admitting: Occupational Therapy

## 2021-06-08 ENCOUNTER — Encounter: Payer: Self-pay | Admitting: Occupational Therapy

## 2021-06-08 ENCOUNTER — Other Ambulatory Visit: Payer: Self-pay

## 2021-06-08 ENCOUNTER — Ambulatory Visit: Payer: Medicare Other

## 2021-06-08 DIAGNOSIS — R2681 Unsteadiness on feet: Secondary | ICD-10-CM

## 2021-06-08 DIAGNOSIS — M6281 Muscle weakness (generalized): Secondary | ICD-10-CM

## 2021-06-08 DIAGNOSIS — M25642 Stiffness of left hand, not elsewhere classified: Secondary | ICD-10-CM

## 2021-06-08 DIAGNOSIS — M25641 Stiffness of right hand, not elsewhere classified: Secondary | ICD-10-CM

## 2021-06-08 DIAGNOSIS — R2689 Other abnormalities of gait and mobility: Secondary | ICD-10-CM

## 2021-06-08 DIAGNOSIS — R278 Other lack of coordination: Secondary | ICD-10-CM

## 2021-06-08 NOTE — Therapy (Signed)
[facility] [address] [place], [place], [address] Phone: [phone]   Fax:  [phone]  Physical Therapy Treatment  Patient Details  Name: Brian Espinoza MRN: [id number] Date of Birth: Apr 07, 1956 Referring Provider (PT): Alison MurrayMcLean, Hussain, MD   Encounter Date: 06/08/2021   PT End of Session - 06/08/21 1029     Visit Number 21    Number of Visits 52    Date for PT Re-Evaluation 08/16/21    Authorization Type Medicare A&B; BCBS - 10th visit PN    Progress Note Due on Visit 30    PT Start Time 1029   transportation running behind   PT Stop Time 1104    PT Time Calculation (min) 35 min    Equipment Utilized During Treatment Other (comment)   bilat AFOs   Activity Tolerance Patient tolerated treatment well    Behavior During Therapy WFL for tasks assessed/performed             Past Medical History:  Diagnosis Date   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 No pertinent surgical history.  There were no vitals filed for this visit.   Subjective Assessment - 06/08/21 1030     Subjective Pt reports doing good. His left hip has been sore since last visit but has had issues for awhile as well. Pt got new w/c on Tuesday and can sit up all the way. Right lateral malleolus still very tender to any touch reporting right brace was very uncomfortable last time.    Pertinent History Chest pain, dissection of descending thoracic aorta, aneurysm of artery, a-fib, HTN, systolic CHF, oral phase dysphagia, sacral wound, PNA    Patient Stated Goals Improve hand function and walk to be more independent with daily activities.  Gain strength in legs to be able to lift hips and reduce shear on sacrum.    Currently in Pain? Yes    Pain  Score 1     Pain Location Hip    Pain Orientation Left    Pain Descriptors / Indicators Sore    Pain Type Chronic pain                               [facility] Adult PT Treatment/Exercise - 06/08/21 1032       Transfers   Transfers Squat Pivot Transfers;Sit to Stand;Stand to Sit    Sit to Stand 1: +2 Total assist    Sit to Stand Details Verbal cues for sequencing    Sit to Stand Details (indicate cue type and reason) Performed x 4 reps with 2 consecutively during session. Total assist due to 2 people but min/mod assist of 2 people overall. Verbal cueing to try to grade his movement versus just shoot up. PT and rehab tech blocking knees in front. Bilateral AFOs donned.    Stand to Sit 1: +2 Total assist    Stand to Sit Details (indicate cue type and reason) Verbal cues for technique;Tactile cues for posture    Stand to Sit Details Again total assist due to +2 but mod assist of 2 people overall. With descent instructed to flex forward and trunk and control descent. Improved control when flexed forward at trunk.  Squat Pivot Transfers 3: Mod assist    Squat Pivot Transfer Details (indicate cue type and reason) w/c to mat with bilateral AFOs donned. Pt able to come forward and PT assisted to pivot to mat.    Comments Standing with +2 assist with mod/max assist with PT and rehab tech blocking pt's knees and bilateral AFOs donned (Left anterior Thuasne with lateral strut, right anterior ottobock with medial strut). Mirror in front for visual cues. Performed first working on upright posture staying over legs and bringing bottom back some. Pt locking out knees some especially on right. Worked on weight shifting side to side 5 x 2. Weight shifting with reaching to touch edge of mirror with ipsilateral arm x 5. Partial squat x 3 with legs blocked and trying to grade movement. Pt denied any pain in right ankle with different brace today.                       PT Short Term  Goals - 05/18/21 1324       PT SHORT TERM GOAL #1   Title = LTG for 30 days 1/22, 60 days 2/21, 90 days 3/23               PT Long Term Goals - 05/18/21 1323       PT LONG TERM GOAL #1   Title Pt and wife will demonstrate independence with progressed HEP (LTG set at 30 days - reset to 60 days 2/21 and 90 days 3/23)    Time 4    Period Weeks    Status Revised    Target Date 06/17/21      PT LONG TERM GOAL #2   Title Pt will demonstrate ability to perform supine <> sit on flat mat with min A    Baseline mod-max A    Time 4    Period Weeks    Status Revised    Target Date 06/17/21      PT LONG TERM GOAL #3   Title Pt will perform squat-scooting transfers w/c <> mat consistently to L and R on level surface with mod A    Baseline mod-max A    Time 4    Period Weeks    Status Revised    Target Date 06/17/21      PT LONG TERM GOAL #4   Title Pt will demonstrate ability to perform sit > stand from elevated mat with mod A, maintain prolonged standing with UE support x 5 minutes and return to sitting with max A for controlled descent    Time 4    Period Weeks    Status Revised    Target Date 06/17/21                   Plan - 06/08/21 1104     Clinical Impression Statement PT continued to work on standing this time with +2 assist from rehab tech to block knee on side opposite therapist. Donned left anterior Thuasne AFO and  R ant ottobock AFO as had medial post and pt with no reports of pain in ankle today. Also had his sneakers which provided better support for braces. Focused on grading movements in standing and with sit to/from stand. Pt able to control descent better when cued to lean forward at trunk.    Comorbidities Chest pain, dissection of descending thoracic aorta, aneurysm of artery, a-fib, HTN, systolic CHF, oral phase dysphagia, sacral wound, PNA  Rehab Potential Good    PT Frequency 3x / week    PT Duration 12 weeks    PT Treatment/Interventions  ADLs/Self Care Home Management;DME Instruction;Gait training;Stair training;Functional mobility training;Therapeutic activities;Therapeutic exercise;Balance training;Neuromuscular re-education;Wheelchair mobility training;Orthotic Fit/Training;Patient/family education;Energy conservation    PT Next Visit Plan Bufford Lope- I would perhaps try Huntley Dec in front for arm support but not using as lift if have Ryan or 2nd person to help with this. Could then work on improving weight shift and grading movements. Could also consider BWS (XL harness) for safety if will fit around Huntley Dec to initiate some stepping. Left anterior Thuasne AFO and right anterior ottobock that has medial post was most comfortable. From [place]~ Standing was ALOT with body weight support, I wonder how he would do in tall kneeling to take lower legs out of picture to work proximally and on trunk?? Still would need a lot of help!~ MONITOR BP: Keep SBP between 120-130! SCI Fit in wheelchair with LE only and belt around thighs.  Squat scooting w/c <> mat without slideboard.    Continue transfer training with wife to move away from use of hoyer.    Consulted and Agree with Plan of Care Patient             Patient will benefit from skilled therapeutic intervention in order to improve the following deficits and impairments:  Cardiopulmonary status limiting activity, Decreased activity tolerance, Decreased balance, Decreased coordination, Decreased endurance, Decreased mobility, Decreased strength, Difficulty walking, Impaired sensation, Impaired tone, Impaired UE functional use, Postural dysfunction, Pain, Abnormal gait  Visit Diagnosis: Other abnormalities of gait and mobility  Muscle weakness (generalized)     Problem List Patient Active Problem List   Diagnosis Date Noted   Acute on chronic respiratory failure with hypoxia (HCC)    Acute inflammatory demyelinating polyneuropathy (HCC)    Dysautonomia (HCC)    Atrial fibrillation with RVR  (HCC)    Healthcare associated bacterial pneumonia     Ronn Melena, PT, DPT, NCS 06/08/2021, 2:19 PM  [facility] [address] [place], [place], [address] Phone: [phone]   Fax:  [phone]  Name: Brian Espinoza MRN: [id number] Date of Birth: 10-21-1955

## 2021-06-08 NOTE — Therapy (Signed)
[facility] [address] [place], [place], [address] Phone: [phone]   Fax:  [phone]  Occupational Therapy Treatment & Progress Note  Patient Details  Name: Brian Espinoza MRN: [id number] Date of Birth: Nov 10, 1955 Referring Provider (OT): Aundra Dubin (Will send certification to PCP [place])   Encounter Date: 06/08/2021   OT End of Session - 06/08/21 1107     Visit Number 20    Number of Visits 37    Date for OT Re-Evaluation 06/20/21    Authorization Type Medicare and Federal BCBS    Progress Note Due on Visit 20    OT Start Time 1102    OT Stop Time 1145    OT Time Calculation (min) 43 min    Activity Tolerance Patient tolerated treatment well    Behavior During Therapy WFL for tasks assessed/performed             Past Medical History:  Diagnosis Date   Acute inflammatory demyelinating polyneuropathy ([place])    Acute on chronic respiratory failure with hypoxia (HCC)    Atrial fibrillation with RVR ([place])    Dysautonomia ([place])    Healthcare associated bacterial pneumonia     History reviewed. No pertinent surgical history.  There were no vitals filed for this visit.   Subjective Assessment - 06/08/21 1102     Subjective  I need to get scheduled for OT for transportation    Currently in Pain? No/denies    Pain Score 0-No pain                          OT Treatments/Exercises (OP) - 06/08/21 0001       Transfers   Comments squat pivot/scoot transfer with mod A to left from edge of mat to tilt in space      ADLs   Eating Pt with req'd min.mod A for managing support for bringing cup to mouth today and management of straw d/t no lid.    ADL Education Given Yes    Foam Grip for Utensils/Toothbrush/Pen reviewed potential universal cuff equipment for adapting toothbrush, utensils, water bottles, etc for increasing independence with grip    Adaptive  Utensils showed patint Giraffe Bottle for increasing independence with drinking water at night and during the day while in chair.    3-in-1 pt has BSC at home but unsure if it is drop arm or not. Educated patient on difference and to check to make sure and we can try to trouble shoot and practice this / simulate in clinic for increasing independence and ability to complete toileting at home. Pt is currently using bed pan for bowel and urinal for bladder.    Tub Transfer Bench reviewed use of tub bench - instructed patient to take pictures of bathroom and see if wheelchair can fit in bathroom would be first step for seeing how realistic showers are at this time. Patient verbablized understanding      Elbow Exercises   Other elbow exercises w  use of active hands support did 10 reps of bicep curls with 2 lb dumbbells, individually. Pt req'd support at wrist on LUE for exercise                      OT Short Term Goals - 06/06/21 1220       OT SHORT TERM GOAL #1   Title Patient and caregiver will  complete a home exercise program designed to improve strength in proximal UE's - shoulders and elbows    Time 4    Period Weeks    Status Achieved   issued AROM exercises and gentle theraband tricep strengthening 04/30/21   Target Date 04/21/21      OT SHORT TERM GOAL #2   Title Following set up patient will hold a cup with lid and long straw in midline to allow him to take a sip on his own    Time 4    Period Weeks    Status Partially Met   completed with simulation in clinic on 04/30/21 - continue to assess for consistency     OT SHORT TERM GOAL #3   Title While seated in upright supported position, patient will reach forward to make contact with upright target on table top (chest height) x 5 each arm    Time 4    Period Weeks    Status Achieved      OT SHORT TERM GOAL #4   Title Patient will be able to reach toward face with either right or left hand in preparation to scratch  chin/cheek    Time 4    Period Weeks    Status Achieved      OT SHORT TERM GOAL #5   Title Patient will utilize cylindrical grip to hold small water bottle in one hand.    Time 4    Period Weeks    Status Partially Met   demonstrated holding bottle in RUE however not consistently 04/30/21              OT Long Term Goals - 06/08/21 1224       OT LONG TERM GOAL #1   Title Patient will complete an updated stretching program to prevent contracture in digits    Time 12    Period Weeks    Status On-going      OT LONG TERM GOAL #2   Title Patient and caregiver will demonstrate awareness of splint wear and care for BUE    Time 12    Period Weeks    Status On-going   continue to [employer] and address PRN     OT LONG TERM GOAL #3   Title Patient will wash his face and upper body and upper legs with min assist    Time 12    Period Weeks    Status On-going   continuing to progress towards this goal     OT LONG TERM GOAL #4   Title Patient will actively bridge in bed to assist with pericare as needed    Time 12    Period Weeks    Status On-going   continuing to progress towards goal     OT LONG TERM GOAL #5   Title Patient will transfer to adapted commode with max assist in prep for toileting    Time 12    Period Weeks    Status On-going   working on troubleshooting [facility] and transfers for continuing to progess towards this goal     OT LONG TERM GOAL #6   Title Patient will transfer into/ out of shower with mod assist, and min assist for seated control during shower    Time 12    Period Weeks    Status On-going   much improvement with control and working towards goal  Plan - 06/08/21 1222     Clinical Impression Statement progress note today for 20th visit. Pt is progressing well towards goals with steady progress. Pt sat edge of mat for duration of session, unsupported, indicating improved postural control and trunk stability. Pt continues to make  progress with BUE functional use and increasing independence. Pt receptive and excited about possible adapted equipment for incresed independence today. Skilled occupational therapy continues to be recommended to continue with steady progress with increased independence and BUE functional use.    OT Occupational Profile and History Comprehensive Assessment- Review of records and extensive additional review of physical, cognitive, psychosocial history related to current functional performance    Occupational performance deficits (Please refer to evaluation for details): ADL's;IADL's;Rest and Sleep;Leisure    Body Structure / Function / Physical Skills ADL;Decreased knowledge of use of DME;Strength;Balance;Dexterity;GMC;Pain;Tone;Body mechanics;Proprioception;UE functional use;Cardiopulmonary status limiting activity;Endurance;IADL;ROM;Fascial restriction;Improper spinal/pelvic alignment;Coordination;Flexibility;Mobility;Sensation;Wound;FMC;Muscle spasms;Skin integrity    Rehab Potential Good    Clinical Decision Making Multiple treatment options, significant modification of task necessary    Comorbidities Affecting Occupational Performance: Presence of comorbidities impacting occupational performance    Comorbidities impacting occupational performance description: AIDP, Chronic back pain, Descending thoracic aortic anneurysm    Modification or Assistance to Complete Evaluation  Min-Moderate modification of tasks or assist with assess necessary to complete eval    OT Frequency 3x / week    OT Duration 12 weeks    OT Treatment/Interventions Self-care/ADL training;Moist Heat;Fluidtherapy;DME and/or AE instruction;Splinting;Balance training;Aquatic Therapy;Contrast Bath;Therapeutic activities;Ultrasound;Therapeutic exercise;Passive range of motion;Functional Mobility Training;Neuromuscular education;Electrical Stimulation;Manual Therapy;Patient/family education;Energy conservation    Plan Continue strength /  coordination UE's functional use of UE's, transfers    Consulted and Agree with Plan of Care Patient             Patient will benefit from skilled therapeutic intervention in order to improve the following deficits and impairments:   Body Structure / Function / Physical Skills: ADL, Decreased knowledge of use of DME, Strength, Balance, Dexterity, GMC, Pain, Tone, Body mechanics, Proprioception, UE functional use, Cardiopulmonary status limiting activity, Endurance, IADL, ROM, Fascial restriction, Improper spinal/pelvic alignment, Coordination, Flexibility, Mobility, Sensation, Wound, FMC, Muscle spasms, Skin integrity       Visit Diagnosis: Muscle weakness (generalized)  Unsteadiness on feet  Other abnormalities of gait and mobility  Other lack of coordination  Stiffness of right hand, not elsewhere classified  Stiffness of left hand, not elsewhere classified    Problem List Patient Active Problem List   Diagnosis Date Noted   Acute on chronic respiratory failure with hypoxia (HCC)    Acute inflammatory demyelinating polyneuropathy ([place])    Dysautonomia ([place])    Atrial fibrillation with RVR ([place])    Healthcare associated bacterial pneumonia     Zachery Conch, OT 06/08/2021, 12:30 PM  [facility] [address] [place], [place], [address] Phone: [phone]   Fax:  [phone]  Name: Brian Espinoza MRN: [id number] Date of Birth: 1956/05/16

## 2021-06-11 ENCOUNTER — Other Ambulatory Visit: Payer: Self-pay

## 2021-06-11 ENCOUNTER — Ambulatory Visit: Payer: Medicare Other | Admitting: Physical Therapy

## 2021-06-11 ENCOUNTER — Encounter: Payer: Self-pay | Admitting: Occupational Therapy

## 2021-06-11 ENCOUNTER — Ambulatory Visit: Payer: Medicare Other | Admitting: Occupational Therapy

## 2021-06-11 DIAGNOSIS — R2689 Other abnormalities of gait and mobility: Secondary | ICD-10-CM

## 2021-06-11 DIAGNOSIS — R208 Other disturbances of skin sensation: Secondary | ICD-10-CM

## 2021-06-11 DIAGNOSIS — R2681 Unsteadiness on feet: Secondary | ICD-10-CM

## 2021-06-11 DIAGNOSIS — M25642 Stiffness of left hand, not elsewhere classified: Secondary | ICD-10-CM

## 2021-06-11 DIAGNOSIS — R295 Transient paralysis: Secondary | ICD-10-CM

## 2021-06-11 DIAGNOSIS — M6281 Muscle weakness (generalized): Secondary | ICD-10-CM

## 2021-06-11 DIAGNOSIS — R278 Other lack of coordination: Secondary | ICD-10-CM | POA: Diagnosis not present

## 2021-06-11 DIAGNOSIS — M25641 Stiffness of right hand, not elsewhere classified: Secondary | ICD-10-CM

## 2021-06-11 NOTE — Therapy (Signed)
[facility] [address] [place], [place], [address] Phone: [phone]   Fax:  [phone]  Physical Therapy Treatment  Patient Details  Name: Brian Espinoza MRN: [id number] Date of Birth: 1955/12/18 Referring Provider (PT): Leandro Reasoner, MD   Encounter Date: 06/11/2021   PT End of Session - 06/11/21 1542     Visit Number 22    Number of Visits 49    Date for PT Re-Evaluation 08/16/21    Authorization Type Medicare A&B; BCBS - 10th visit PN    Progress Note Due on Visit 75    PT Start Time 1450    PT Stop Time 1530    PT Time Calculation (min) 40 min    Activity Tolerance Patient tolerated treatment well    Behavior During Therapy WFL for tasks assessed/performed             Past Medical History:  Diagnosis Date   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  There were no vitals filed for this visit.   Subjective Assessment - 06/11/21 1452     Subjective L hip is not hurting today.  Ankle is feeling better since they switched R anterior brace.  Still doing transfers at home with wife without hoyer.  Was able to go see both granddaughters' basketball games.    Pertinent History Chest pain, dissection of descending thoracic aorta, aneurysm of artery, a-fib, HTN, systolic CHF, oral phase dysphagia, sacral wound, PNA    Patient Stated Goals Improve hand function and walk to be more independent with daily activities.  Gain strength in legs to be able to lift hips and reduce shear on sacrum.    Currently in Pain? No/denies                [facility] Adult PT Treatment/Exercise - 06/11/21 1535       Bed Mobility   Bed Mobility Sit to Supine    Supine to Sit Moderate Assistance - Patient 50-74%    able to support self on elbow while PT assisted with fully lifting LE onto mat     Transfers   Transfers Squat Pivot Transfers    Squat Pivot Transfers 3: Mod assist    Squat Pivot Transfer Details (indicate cue type and reason) w/c > mat to R side x 2 pivots.  Therapist assisted with maintaining foot placement on floor and assisted at trunk to keep trunk and weight shifted forwards over BOS.  Pt able to perform full sit > squat and pivot.      Balance   Balance Assessed Yes      Dynamic Sitting Balance   Dynamic Sitting - Balance Support Bilateral upper extremity supported;Feet supported;During functional activity    Dynamic Sitting - Level of Assistance 3: Mod assist;1: +2 Total assist    Dynamic Sitting - Balance Activities Forward lean/weight shifting    Sitting balance - Comments Began with one person assist.  Began with large blue ball in front of pt - pt cued to bring forearms down onto ball and WB through forearms activating scapula protractor mm to maintain shoulder stability during WB and while rolling ball forwards with forarms.  Changed to elbows extended and placing bilat hands/wrists on ball (fingers flexed).  Pt able  to maintain hand placement, shoulder stability and WB through wrists while rolling ball forwards with anterior trunk lean keeping upright posture.  Added OT as second person and performed multiple reps of anterior leans and weight shifting onto BOS and UE and coming to small squat position and maintaining for 1-2 seconds.  Pt required assistance to stabilize wrists and maintain foot position on floor; also required intermittent assistance to block knees and verbal cues to activate hip and knee extension.  Added to squat, small pivot to R and then L, alternating x 4 reps.  Pt requires less assistance to pivot to R; more difficult pivoting to L.      Exercises   Exercises Knee/Hip      Knee/Hip Exercises: Supine   Single Leg Bridge Right;Strengthening;1 set;5 reps                PT Short Term Goals - 05/18/21 1324       PT SHORT TERM GOAL #1   Title = LTG for 30 days 1/22, 60 days 2/21, 90 days 3/23               PT Long Term Goals - 05/18/21 1323       PT LONG TERM GOAL #1   Title Pt and wife will demonstrate independence with progressed HEP (LTG set at 30 days - reset to 60 days 2/21 and 90 days 3/23)    Time 4    Period Weeks    Status Revised    Target Date 06/17/21      PT LONG TERM GOAL #2   Title Pt will demonstrate ability to perform supine <> sit on flat mat with min A    Baseline mod-max A    Time 4    Period Weeks    Status Revised    Target Date 06/17/21      PT LONG TERM GOAL #3   Title Pt will perform squat-scooting transfers w/c <> mat consistently to L and R on level surface with mod A    Baseline mod-max A    Time 4    Period Weeks    Status Revised    Target Date 06/17/21      PT LONG TERM GOAL #4   Title Pt will demonstrate ability to perform sit > stand from elevated mat with mod A, maintain prolonged standing with UE support x 5 minutes and return to sitting with max A for controlled descent    Time 4    Period Weeks    Status Revised    Target Date 06/17/21                   Plan - 06/11/21 1543     Clinical Impression Statement Pt continues to demonstrate improved control and activation of LE when in WB but continues to require significant assistance to maintain weight shift forwards over BOS.  Focused session on blocked practice of sustained anterior trunk lean and weight shift over BOS and then activating sit > squat to begin to decrease amount of assistance required for transfers.  Pt also demonstrating improved control and strength in bilat UE in WB positions.    Comorbidities Chest pain, dissection of descending thoracic aorta, aneurysm of artery, a-fib, HTN, systolic CHF, oral phase dysphagia, sacral wound, PNA    Rehab Potential Good    PT Frequency 3x / week    PT Duration 12 weeks     PT Treatment/Interventions ADLs/Self Care  Home Management;DME Instruction;Gait training;Stair training;Functional mobility training;Therapeutic activities;Therapeutic exercise;Balance training;Neuromuscular re-education;Wheelchair mobility training;Orthotic Fit/Training;Patient/family education;Energy conservation    PT Next Visit Plan Sit <> squat training and increase use of RLE to pivot to L; does pretty good going to R.  Left anterior [place] AFO and right anterior ottobock that has medial post was most comfortable. If +2 available - try tall kneeling to take lower legs out of picture to work proximally and on trunk?? Still would need a lot of help!~ MONITOR BP: Keep SBP between 120-130! SCI Fit in wheelchair with LE only and belt around thighs.  Squat pivot transfers helping keep trunk forwards over feet.    Consulted and Agree with Plan of Care Patient             Patient will benefit from skilled therapeutic intervention in order to improve the following deficits and impairments:  Cardiopulmonary status limiting activity, Decreased activity tolerance, Decreased balance, Decreased coordination, Decreased endurance, Decreased mobility, Decreased strength, Difficulty walking, Impaired sensation, Impaired tone, Impaired UE functional use, Postural dysfunction, Pain, Abnormal gait  Visit Diagnosis: Other abnormalities of gait and mobility  Muscle weakness (generalized)  Transient paralysis  Other disturbances of skin sensation  Unsteadiness on feet     Problem List Patient Active Problem List   Diagnosis Date Noted   Acute on chronic respiratory failure with hypoxia (HCC)    Acute inflammatory demyelinating polyneuropathy (HCC)    Dysautonomia (HCC)    Atrial fibrillation with RVR ([place])    Healthcare associated bacterial pneumonia     Brian Espinoza, PT, DPT 06/11/21    3:47 PM    [facility] [address] [place], [place], [address] Phone: [phone]   Fax:  [phone]  Name: Brian Espinoza MRN: [id number] Date of Birth: [birth year]

## 2021-06-11 NOTE — Therapy (Signed)
[facility] [address] [place], [place], [address] Phone: [phone]   Fax:  [phone]  Occupational Therapy Treatment  Patient Details  Name: Brian Espinoza MRN: [id number] Date of Birth: 1955/09/22 Referring Provider (OT): Aundra Dubin (Will send certification to PCP [place])   Encounter Date: 06/11/2021   OT End of Session - 06/11/21 1608     Visit Number 21    Number of Visits 37    Date for OT Re-Evaluation 06/20/21    Authorization Type Medicare and Federal BCBS    Progress Note Due on Visit 30    OT Start Time 1530    OT Stop Time 1615    OT Time Calculation (min) 45 min    Activity Tolerance Patient tolerated treatment well    Behavior During Therapy WFL for tasks assessed/performed             Past Medical History:  Diagnosis Date   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 No pertinent surgical history.  There were no vitals filed for this visit.   Subjective Assessment - 06/11/21 1542     Subjective  "I brought my splints with me - I wanted to see if you would make them straighter" - OT advises on keeping them in functional position at this time.    Currently in Pain? No/denies    Pain Score 0-No pain              Supine pt was in supine upon arrival for OT session. Pt completed BUE strengthening exercises with 2 lb wrist weights - shoulder flexion, chest press, bicep curls x 10 reps each  Hip Bridges x 10 for increasing independence with LB dressing and percare. Pt only req'd assistance for knee stabilization/adduction  Bed Mobility rolling to each side with supervision. Supine to sitting edge of mat with moderate assistance for assistance getting RUE elbow  under shoulder to push up and control of LLE.   Drinking from a cup with straw with max assistance d/t inability to control and modulate force on styrofoam cup and management of straw  3.3 lb ball with mod assistance for support and control of grasping ball to complete 10 bicep curls with BUE. Pt with difficulty controlling force and modulating force therefore rotating ball if no assistance.   Bathing pt used washcloth and demonstrated bathing upper body/trunk, BUE and upper thighs and washing face with dropping washcloth x 2 but with good control and sitting balance.                   OT Short Term Goals - 06/06/21 1220       OT SHORT TERM GOAL #1   Title Patient and caregiver will complete a home exercise program designed to improve strength in proximal UE's - shoulders and elbows    Time 4    Period Weeks    Status Achieved   issued AROM exercises and gentle theraband tricep strengthening 04/30/21   Target Date 04/21/21      OT SHORT TERM GOAL #2   Title Following set up patient will hold a cup with lid and long straw in midline to allow him to take a sip on his own    Time  4    Period Weeks    Status Partially Met   completed with simulation in clinic on 04/30/21 - continue to assess for consistency     OT SHORT TERM GOAL #3   Title While seated in upright supported position, patient will reach forward to make contact with upright target on table top (chest height) x 5 each arm    Time 4    Period Weeks    Status Achieved      OT SHORT TERM GOAL #4   Title Patient will be able to reach toward face with either right or left hand in preparation to scratch chin/cheek    Time 4    Period Weeks    Status Achieved      OT SHORT TERM GOAL #5   Title Patient will utilize cylindrical grip to hold small water bottle in one hand.    Time 4    Period Weeks    Status Partially Met   demonstrated holding bottle in RUE however not consistently 04/30/21               OT Long Term Goals - 06/08/21 1224       OT LONG TERM GOAL #1   Title Patient will complete an updated stretching program to prevent contracture in digits    Time 12    Period Weeks    Status On-going      OT LONG TERM GOAL #2   Title Patient and caregiver will demonstrate awareness of splint wear and care for BUE    Time 12    Period Weeks    Status On-going   continue to [employer] and address PRN     OT LONG TERM GOAL #3   Title Patient will wash his face and upper body and upper legs with min assist    Time 12    Period Weeks    Status On-going   continuing to progress towards this goal     OT LONG TERM GOAL #4   Title Patient will actively bridge in bed to assist with pericare as needed    Time 12    Period Weeks    Status On-going   continuing to progress towards goal     OT LONG TERM GOAL #5   Title Patient will transfer to adapted commode with max assist in prep for toileting    Time 12    Period Weeks    Status On-going   working on troubleshooting [facility] and transfers for continuing to progess towards this goal     OT LONG TERM GOAL #6   Title Patient will transfer into/ out of shower with mod assist, and min assist for seated control during shower    Time 12    Period Weeks    Status On-going   much improvement with control and working towards goal                  Plan - 06/11/21 1625     Clinical Impression Statement Pt demonstrating good trunk support and control with upright sitting, ability to bridge for increasing independence with pericare and LB dressing and demonstrated increased independence with bathing.    OT Occupational Profile and History Comprehensive Assessment- Review of records and extensive additional review of physical, cognitive, psychosocial history related to current functional performance    Occupational performance deficits (Please refer to evaluation for details): ADL's;IADL's;Rest and Sleep;Leisure    Body Structure / Function /  Physical Skills ADL;Decreased knowledge of use of DME;Strength;Balance;Dexterity;GMC;Pain;Tone;Body mechanics;Proprioception;UE functional use;Cardiopulmonary status limiting activity;Endurance;IADL;ROM;Fascial restriction;Improper spinal/pelvic alignment;Coordination;Flexibility;Mobility;Sensation;Wound;FMC;Muscle spasms;Skin integrity    Rehab Potential Good    Clinical Decision Making Multiple treatment options, significant modification of task necessary    Comorbidities Affecting Occupational Performance: Presence of comorbidities impacting occupational performance    Comorbidities impacting occupational performance description: AIDP, Chronic back pain, Descending thoracic aortic anneurysm    Modification or Assistance to Complete Evaluation  Min-Moderate modification of tasks or assist with assess necessary to complete eval    OT Frequency 3x / week    OT Duration 12 weeks    OT Treatment/Interventions Self-care/ADL training;Moist Heat;Fluidtherapy;DME and/or AE instruction;Splinting;Balance training;Aquatic Therapy;Contrast Bath;Therapeutic activities;Ultrasound;Therapeutic exercise;Passive range of motion;Functional Mobility Training;Neuromuscular education;Electrical Stimulation;Manual Therapy;Patient/family education;Energy conservation    Plan Continue strength / coordination UE's functional use of UE's, transfers    Consulted and Agree with Plan of Care Patient             Patient will benefit from skilled therapeutic intervention in order to improve the following deficits and impairments:   Body Structure / Function / Physical Skills: ADL, Decreased knowledge of use of DME, Strength, Balance, Dexterity, GMC, Pain, Tone, Body mechanics, Proprioception, UE functional use, Cardiopulmonary status limiting activity, Endurance, IADL, ROM, Fascial restriction, Improper spinal/pelvic alignment, Coordination, Flexibility, Mobility, Sensation, Wound, FMC, Muscle spasms, Skin integrity        Visit Diagnosis: Other abnormalities of gait and mobility  Muscle weakness (generalized)  Unsteadiness on feet  Other lack of coordination  Other disturbances of skin sensation  Stiffness of left hand, not elsewhere classified  Stiffness of right hand, not elsewhere classified    Problem List Patient Active Problem List   Diagnosis Date Noted   Acute on chronic respiratory failure with hypoxia (HCC)    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

## 2021-06-13 ENCOUNTER — Ambulatory Visit: Payer: Medicare Other

## 2021-06-13 ENCOUNTER — Encounter: Payer: Self-pay | Admitting: Occupational Therapy

## 2021-06-13 ENCOUNTER — Other Ambulatory Visit: Payer: Self-pay

## 2021-06-13 ENCOUNTER — Ambulatory Visit: Payer: Medicare Other | Admitting: Occupational Therapy

## 2021-06-13 VITALS — BP 120/62

## 2021-06-13 DIAGNOSIS — R2681 Unsteadiness on feet: Secondary | ICD-10-CM

## 2021-06-13 DIAGNOSIS — R208 Other disturbances of skin sensation: Secondary | ICD-10-CM

## 2021-06-13 DIAGNOSIS — R2689 Other abnormalities of gait and mobility: Secondary | ICD-10-CM

## 2021-06-13 DIAGNOSIS — M25642 Stiffness of left hand, not elsewhere classified: Secondary | ICD-10-CM

## 2021-06-13 DIAGNOSIS — R278 Other lack of coordination: Secondary | ICD-10-CM

## 2021-06-13 DIAGNOSIS — M6281 Muscle weakness (generalized): Secondary | ICD-10-CM

## 2021-06-13 DIAGNOSIS — M25641 Stiffness of right hand, not elsewhere classified: Secondary | ICD-10-CM

## 2021-06-13 NOTE — Therapy (Signed)
[facility] [address] [place], [place], [address] Phone: [phone]   Fax:  [phone]  Occupational Therapy Treatment  Patient Details  Name: Brian Espinoza MRN: [id number] Date of Birth: May 07, 1956 Referring Provider (OT): Aundra Dubin (Will send certification to PCP [place])   Encounter Date: 06/13/2021   OT End of Session - 06/13/21 1318     Visit Number 22    Number of Visits 37    Date for OT Re-Evaluation 06/20/21    Authorization Type Medicare and Federal BCBS    Progress Note Due on Visit 30    OT Start Time 1317    OT Stop Time 1400    OT Time Calculation (min) 43 min    Activity Tolerance Patient tolerated treatment well    Behavior During Therapy WFL for tasks assessed/performed             Past Medical History:  Diagnosis Date   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 No pertinent surgical history.  There were no vitals filed for this visit.   Subjective Assessment - 06/13/21 1318     Subjective  "when are we gonna do my fun day - heat treatments and stuff"    Currently in Pain? No/denies    Pain Score 0-No pain                          OT Treatments/Exercises (OP) - 06/13/21 1334       ADLs   Eating bringing styrofoam cup to mouth with mod A for stability and managing straw without lid    UB Dressing donned XL polo shirt with mod A and doffed with mod A today.      Neurological Re-education Exercises   Other Exercises 1 working with stabilizing 1 inch blocks and stacking with RUE hand. pt able to stack 2 but unable ot achieve 3. Pt then worked on color dowel pegs with removing with LUE And placing back with RUE. Max difficulty                       OT Short Term Goals - 06/06/21 1220       OT SHORT TERM GOAL #1   Title Patient and caregiver will complete a home exercise program designed to improve strength in proximal UE's - shoulders and elbows    Time 4    Period Weeks    Status Achieved   issued AROM exercises and gentle theraband tricep strengthening 04/30/21   Target Date 04/21/21      OT SHORT TERM GOAL #2   Title Following set up patient will hold a cup with lid and long straw in midline to allow him to take a sip on his own    Time 4    Period Weeks    Status Partially Met   completed with simulation in clinic on 04/30/21 - continue to assess for consistency     OT SHORT TERM GOAL #3   Title While seated in upright supported position, patient will reach forward to make contact with upright target on table top (chest height) x 5 each arm    Time 4    Period  Weeks    Status Achieved      OT SHORT TERM GOAL #4   Title Patient will be able to reach toward face with either right or left hand in preparation to scratch chin/cheek    Time 4    Period Weeks    Status Achieved      OT SHORT TERM GOAL #5   Title Patient will utilize cylindrical grip to hold small water bottle in one hand.    Time 4    Period Weeks    Status Partially Met   demonstrated holding bottle in RUE however not consistently 04/30/21              OT Long Term Goals - 06/08/21 1224       OT LONG TERM GOAL #1   Title Patient will complete an updated stretching program to prevent contracture in digits    Time 12    Period Weeks    Status On-going      OT LONG TERM GOAL #2   Title Patient and caregiver will demonstrate awareness of splint wear and care for BUE    Time 12    Period Weeks    Status On-going   continue to [employer] and address PRN     OT LONG TERM GOAL #3   Title Patient will wash his face and upper body and upper legs with min assist    Time 12    Period Weeks    Status On-going   continuing to  progress towards this goal     OT LONG TERM GOAL #4   Title Patient will actively bridge in bed to assist with pericare as needed    Time 12    Period Weeks    Status On-going   continuing to progress towards goal     OT LONG TERM GOAL #5   Title Patient will transfer to adapted commode with max assist in prep for toileting    Time 12    Period Weeks    Status On-going   working on troubleshooting [facility] and transfers for continuing to progess towards this goal     OT LONG TERM GOAL #6   Title Patient will transfer into/ out of shower with mod assist, and min assist for seated control during shower    Time 12    Period Weeks    Status On-going   much improvement with control and working towards goal                  Plan - 06/13/21 1447     Clinical Impression Statement Pt increased independence with ADLs as evidenced by attempting UB dressing and completing approx 50%.    OT Occupational Profile and History Comprehensive Assessment- Review of records and extensive additional review of physical, cognitive, psychosocial history related to current functional performance    Occupational performance deficits (Please refer to evaluation for details): ADL's;IADL's;Rest and Sleep;Leisure    Body Structure / Function / Physical Skills ADL;Decreased knowledge of use of DME;Strength;Balance;Dexterity;GMC;Pain;Tone;Body mechanics;Proprioception;UE functional use;Cardiopulmonary status limiting activity;Endurance;IADL;ROM;Fascial restriction;Improper spinal/pelvic alignment;Coordination;Flexibility;Mobility;Sensation;Wound;FMC;Muscle spasms;Skin integrity    Rehab Potential Good    Clinical Decision Making Multiple treatment options, significant modification of task necessary    Comorbidities Affecting Occupational Performance: Presence of comorbidities impacting occupational performance    Comorbidities impacting occupational performance description: AIDP, Chronic back pain, Descending  thoracic aortic anneurysm    Modification or Assistance to Complete Evaluation  Min-Moderate modification of tasks or assist  with assess necessary to complete eval    OT Frequency 3x / week    OT Duration 12 weeks    OT Treatment/Interventions Self-care/ADL training;Moist Heat;Fluidtherapy;DME and/or AE instruction;Splinting;Balance training;Aquatic Therapy;Contrast Bath;Therapeutic activities;Ultrasound;Therapeutic exercise;Passive range of motion;Functional Mobility Training;Neuromuscular education;Electrical Stimulation;Manual Therapy;Patient/family education;Energy conservation    Plan Continue strength / coordination UE's functional use of UE's, transfers    Consulted and Agree with Plan of Care Patient             Patient will benefit from skilled therapeutic intervention in order to improve the following deficits and impairments:   Body Structure / Function / Physical Skills: ADL, Decreased knowledge of use of DME, Strength, Balance, Dexterity, GMC, Pain, Tone, Body mechanics, Proprioception, UE functional use, Cardiopulmonary status limiting activity, Endurance, IADL, ROM, Fascial restriction, Improper spinal/pelvic alignment, Coordination, Flexibility, Mobility, Sensation, Wound, FMC, Muscle spasms, Skin integrity       Visit Diagnosis: Other abnormalities of gait and mobility  Stiffness of right hand, not elsewhere classified  Muscle weakness (generalized)  Other disturbances of skin sensation  Unsteadiness on feet  Other lack of coordination  Stiffness of left hand, not elsewhere classified    Problem List Patient Active Problem List   Diagnosis Date Noted   Acute on chronic respiratory failure with hypoxia (HCC)    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

## 2021-06-13 NOTE — Therapy (Signed)
[facility] [address] [place], [place], [address] Phone: [phone]   Fax:  [phone]  Physical Therapy Treatment  Patient Details  Name: Brian Espinoza MRN: [id number] Date of Birth: 03/04/1956 Referring Provider (PT): Alison MurrayMcLean, Quest, MD   Encounter Date: 06/13/2021   PT End of Session - 06/13/21 1429     Visit Number 23    Number of Visits 52    Date for PT Re-Evaluation 08/16/21    Authorization Type Medicare A&B; BCBS - 10th visit PN    Progress Note Due on Visit 30    PT Start Time 1400    PT Stop Time 1445    PT Time Calculation (min) 45 min    Activity Tolerance Patient tolerated treatment well    Behavior During Therapy WFL for tasks assessed/performed             Past Medical History:  Diagnosis Date   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 No pertinent surgical history.  Vitals:   06/13/21 1430  BP: 120/62     Subjective Assessment - 06/13/21 1429     Subjective Pt has been doing transfers at home with only wife. No hoyer. A work in progress but still better.    Pertinent History Chest pain, dissection of descending thoracic aorta, aneurysm of artery, a-fib, HTN, systolic CHF, oral phase dysphagia, sacral wound, PNA    Patient Stated Goals Improve hand function and walk to be more independent with daily activities.  Gain strength in legs to be able to lift hips and reduce shear on sacrum.    Currently in Pain? No/denies                               [facility] Adult PT Treatment/Exercise - 06/13/21 1430       Transfers   Transfers Squat Pivot Transfers    Squat Pivot Transfers 3: Mod assist    Squat Pivot Transfer Details (indicate cue type and reason) Performed  squat/pivot to right and left 4 times each consecutively working on improving forward lean each time and repositioning feet each time.      Exercises   Exercises Other Exercises    Other Exercises  NuStep x 5 min then 3 min 30 sec with rest break in between. Belt around thighs to keep hips in neutral and active hands to keep hands in place. Performed with BUE and BLE. PT stabilized w/c for safety. Pt tired at end. BP=138/72 after, some soreness in right hip. Rechecked BP after a couple minutes and down to 124/70.                       PT Short Term Goals - 05/18/21 1324       PT SHORT TERM GOAL #1   Title = LTG for 30 days 1/22, 60 days 2/21, 90 days 3/23               PT Long Term Goals - 05/18/21 1323       PT LONG TERM GOAL #1   Title Pt and wife will demonstrate independence with progressed HEP (LTG set at 30 days - reset to 60 days 2/21 and  90 days 3/23)    Time 4    Period Weeks    Status Revised    Target Date 06/17/21      PT LONG TERM GOAL #2   Title Pt will demonstrate ability to perform supine <> sit on flat mat with min A    Baseline mod-max A    Time 4    Period Weeks    Status Revised    Target Date 06/17/21      PT LONG TERM GOAL #3   Title Pt will perform squat-scooting transfers w/c <> mat consistently to L and R on level surface with mod A    Baseline mod-max A    Time 4    Period Weeks    Status Revised    Target Date 06/17/21      PT LONG TERM GOAL #4   Title Pt will demonstrate ability to perform sit > stand from elevated mat with mod A, maintain prolonged standing with UE support x 5 minutes and return to sitting with max A for controlled descent    Time 4    Period Weeks    Status Revised    Target Date 06/17/21                   Plan - 06/13/21 2017     Clinical Impression Statement Pt was able to perform SciFit today with use of BUE and BLE. Utilized active hands to keep in place. Pt denied any pain in arms with  performance. Did fatigue as went on. Monitored BP throughout with some increase but returned to normal within a couple minutes. Pt did well with performing consecutive squat-pivots after.    Comorbidities Chest pain, dissection of descending thoracic aorta, aneurysm of artery, a-fib, HTN, systolic CHF, oral phase dysphagia, sacral wound, PNA    Rehab Potential Good    PT Frequency 3x / week    PT Duration 12 weeks    PT Treatment/Interventions ADLs/Self Care Home Management;DME Instruction;Gait training;Stair training;Functional mobility training;Therapeutic activities;Therapeutic exercise;Balance training;Neuromuscular re-education;Wheelchair mobility training;Orthotic Fit/Training;Patient/family education;Energy conservation    PT Next Visit Plan Check STGs. Sit <> squat training and increase use of RLE to pivot to L; does pretty good going to R.  Left anterior [place] AFO and right anterior ottobock that has medial post was most comfortable. If +2 available - try tall kneeling to take lower legs out of picture to work proximally and on trunk?? Still would need a lot of help!~ MONITOR BP: Keep SBP between 120-130! SCI Fit in wheelchair with LE only and belt around thighs.  Squat pivot transfers helping keep trunk forwards over feet.    Consulted and Agree with Plan of Care Patient             Patient will benefit from skilled therapeutic intervention in order to improve the following deficits and impairments:  Cardiopulmonary status limiting activity, Decreased activity tolerance, Decreased balance, Decreased coordination, Decreased endurance, Decreased mobility, Decreased strength, Difficulty walking, Impaired sensation, Impaired tone, Impaired UE functional use, Postural dysfunction, Pain, Abnormal gait  Visit Diagnosis: Other abnormalities of gait and mobility  Muscle weakness (generalized)     Problem List Patient Active Problem List   Diagnosis Date Noted   Acute on chronic  respiratory failure with hypoxia (HCC)    Acute inflammatory demyelinating polyneuropathy (HCC)    Dysautonomia (HCC)    Atrial fibrillation with RVR (HCC)    Healthcare associated bacterial pneumonia  Ronn Melena, PT, DPT, NCS 06/13/2021, 8:20 PM  [facility] [address] [place], [place], [address] Phone: [phone]   Fax:  [phone]  Name: Brian Espinoza MRN: [id number] Date of Birth: 10-18-1955

## 2021-06-15 ENCOUNTER — Ambulatory Visit: Payer: Medicare Other

## 2021-06-15 ENCOUNTER — Ambulatory Visit: Payer: Medicare Other | Admitting: Occupational Therapy

## 2021-06-15 ENCOUNTER — Other Ambulatory Visit: Payer: Self-pay

## 2021-06-15 ENCOUNTER — Encounter: Payer: Self-pay | Admitting: Occupational Therapy

## 2021-06-15 DIAGNOSIS — M6281 Muscle weakness (generalized): Secondary | ICD-10-CM

## 2021-06-15 DIAGNOSIS — R278 Other lack of coordination: Secondary | ICD-10-CM | POA: Diagnosis not present

## 2021-06-15 DIAGNOSIS — M25642 Stiffness of left hand, not elsewhere classified: Secondary | ICD-10-CM

## 2021-06-15 DIAGNOSIS — R208 Other disturbances of skin sensation: Secondary | ICD-10-CM

## 2021-06-15 DIAGNOSIS — M25641 Stiffness of right hand, not elsewhere classified: Secondary | ICD-10-CM

## 2021-06-15 DIAGNOSIS — R2689 Other abnormalities of gait and mobility: Secondary | ICD-10-CM

## 2021-06-15 NOTE — Therapy (Signed)
[facility] [address] [place], [place], [address] Phone: [phone]   Fax:  [phone]  Occupational Therapy Treatment  Patient Details  Name: Brian Espinoza MRN: [id number] Date of Birth: January 11, 1956 Referring Provider (OT): Aundra Dubin (Will send certification to PCP [place])   Encounter Date: 06/15/2021   OT End of Session - 06/15/21 1102     Visit Number 23    Number of Visits 37    Date for OT Re-Evaluation 06/20/21    Authorization Type Medicare and Federal BCBS    Progress Note Due on Visit 30    OT Start Time 1101    OT Stop Time 1145    OT Time Calculation (min) 44 min    Activity Tolerance Patient tolerated treatment well    Behavior During Therapy WFL for tasks assessed/performed             Past Medical History:  Diagnosis Date   Acute inflammatory demyelinating polyneuropathy ([place])    Acute on chronic respiratory failure with hypoxia (HCC)    Atrial fibrillation with RVR ([place])    Dysautonomia ([place])    Healthcare associated bacterial pneumonia     History reviewed. Espinoza pertinent surgical history.  There were Espinoza vitals filed for this visit.   Subjective Assessment - 06/15/21 1101     Subjective  "nothing new" "I had a CAT scan yesterday for my throat"    Currently in Pain? Espinoza/denies    Pain Score 0-Espinoza pain              Sitting balance targeted with sitting edg eof mat all session with dynamic resistive training with yellow theraband, tossing ball and bilateral reaching.  Transfer to right side to tild in space wheelchair from edge of mat with mod A and management of BLE  Bimanual Coordination with tossing and catching ball with mod difficulty with tossing ball d/t proprioception and ability to modulate tension on ball  Reaching  with lateral trunk flexion bilaterally x 5 reps and pushing up off elbow and hand with BUE  Weight Bearing with bilateral  UE and reaching with contralateral UE to floor and up for increasing wrist extension and finger extension in BUE and working on proprioception   Theraband (yellow) x 10 reps bicep curls and tricep extension BUE                       OT Short Term Goals - 06/06/21 1220       OT SHORT TERM GOAL #1   Title Patient and caregiver will complete a home exercise program designed to improve strength in proximal UE's - shoulders and elbows    Time 4    Period Weeks    Status Achieved   issued AROM exercises and gentle theraband tricep strengthening 04/30/21   Target Date 04/21/21      OT SHORT TERM GOAL #2   Title Following set up patient will hold a cup with lid and long straw in midline to allow him to take a sip on his own    Time 4    Period Weeks    Status Partially Met   completed with simulation in clinic on 04/30/21 - continue to assess for consistency     OT SHORT TERM GOAL #3   Title While seated in upright supported position, patient will reach forward to make contact with upright target on table top (chest height)  x 5 each arm    Time 4    Period Weeks    Status Achieved      OT SHORT TERM GOAL #4   Title Patient will be able to reach toward face with either right or left hand in preparation to scratch chin/cheek    Time 4    Period Weeks    Status Achieved      OT SHORT TERM GOAL #5   Title Patient will utilize cylindrical grip to hold small water bottle in one hand.    Time 4    Period Weeks    Status Partially Met   demonstrated holding bottle in RUE however not consistently 04/30/21              OT Long Term Goals - 06/08/21 1224       OT LONG TERM GOAL #1   Title Patient will complete an updated stretching program to prevent contracture in digits    Time 12    Period Weeks    Status On-going      OT LONG TERM GOAL #2   Title Patient and caregiver will demonstrate awareness of splint wear and care for BUE    Time 12    Period Weeks     Status On-going   continue to [employer] and address PRN     OT LONG TERM GOAL #3   Title Patient will wash his face and upper body and upper legs with min assist    Time 12    Period Weeks    Status On-going   continuing to progress towards this goal     OT LONG TERM GOAL #4   Title Patient will actively bridge in bed to assist with pericare as needed    Time 12    Period Weeks    Status On-going   continuing to progress towards goal     OT LONG TERM GOAL #5   Title Patient will transfer to adapted commode with max assist in prep for toileting    Time 12    Period Weeks    Status On-going   working on troubleshooting [facility] and transfers for continuing to progess towards this goal     OT LONG TERM GOAL #6   Title Patient will transfer into/ out of shower with mod assist, and min assist for seated control during shower    Time 12    Period Weeks    Status On-going   much improvement with control and working towards goal                  Plan - 06/15/21 1103     Clinical Impression Statement Pt continues to progress with significant more trunk control and stability and BUE strength.    OT Occupational Profile and History Comprehensive Assessment- Review of records and extensive additional review of physical, cognitive, psychosocial history related to current functional performance    Occupational performance deficits (Please refer to evaluation for details): ADL's;IADL's;Rest and Sleep;Leisure    Body Structure / Function / Physical Skills ADL;Decreased knowledge of use of DME;Strength;Balance;Dexterity;GMC;Pain;Tone;Body mechanics;Proprioception;UE functional use;Cardiopulmonary status limiting activity;Endurance;IADL;ROM;Fascial restriction;Improper spinal/pelvic alignment;Coordination;Flexibility;Mobility;Sensation;Wound;FMC;Muscle spasms;Skin integrity    Rehab Potential Good    Clinical Decision Making Multiple treatment options, significant modification of task necessary     Comorbidities Affecting Occupational Performance: Presence of comorbidities impacting occupational performance    Comorbidities impacting occupational performance description: AIDP, Chronic back pain, Descending thoracic aortic anneurysm    Modification  or Assistance to Complete Evaluation  Min-Moderate modification of tasks or assist with assess necessary to complete eval    OT Frequency 3x / week    OT Duration 12 weeks    OT Treatment/Interventions Self-care/ADL training;Moist Heat;Fluidtherapy;DME and/or AE instruction;Splinting;Balance training;Aquatic Therapy;Contrast Bath;Therapeutic activities;Ultrasound;Therapeutic exercise;Passive range of motion;Functional Mobility Training;Neuromuscular education;Electrical Stimulation;Manual Therapy;Patient/family education;Energy conservation    Plan Continue strength / coordination UE's functional use of UE's, transfers, recert 9/41    Consulted and Agree with Plan of Care Patient             Patient will benefit from skilled therapeutic intervention in order to improve the following deficits and impairments:   Body Structure / Function / Physical Skills: ADL, Decreased knowledge of use of DME, Strength, Balance, Dexterity, GMC, Pain, Tone, Body mechanics, Proprioception, UE functional use, Cardiopulmonary status limiting activity, Endurance, IADL, ROM, Fascial restriction, Improper spinal/pelvic alignment, Coordination, Flexibility, Mobility, Sensation, Wound, FMC, Muscle spasms, Skin integrity       Visit Diagnosis: Muscle weakness (generalized)  Stiffness of right hand, not elsewhere classified  Other disturbances of skin sensation  Stiffness of left hand, not elsewhere classified  Other lack of coordination    Problem List Patient Active Problem List   Diagnosis Date Noted   Acute on chronic respiratory failure with hypoxia (HCC)    Acute inflammatory demyelinating polyneuropathy ([place])    Dysautonomia ([place])    Atrial  fibrillation with RVR [facility])    Healthcare associated bacterial pneumonia     Zachery Conch, OT 06/15/2021, 11:54 AM  [facility] [address] [place], [place], [address] Phone: [phone]   Fax:  [phone]  Name: Brian Espinoza MRN: [id number] Date of Birth: 1955-07-10

## 2021-06-15 NOTE — Therapy (Signed)
[facility] [address] [place], [place], [address] Phone: [phone]   Fax:  [phone]  Physical Therapy Treatment  Patient Details  Name: Brian Espinoza MRN: [id number] Date of Birth: [birth year] Referring Provider (PT): Alison MurrayMcLean, Garrison, MD   Encounter Date: 06/15/2021   PT End of Session - 06/15/21 1023     Visit Number 24    Number of Visits 52    Date for PT Re-Evaluation 08/16/21    Authorization Type Medicare A&B; BCBS - 10th visit PN    Progress Note Due on Visit 30    PT Start Time 1021    PT Stop Time 1100    PT Time Calculation (min) 39 min    Activity Tolerance Patient tolerated treatment well    Behavior During Therapy WFL for tasks assessed/performed             Past Medical History:  Diagnosis Date   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 No pertinent surgical history.  There were no vitals filed for this visit.   Subjective Assessment - 06/15/21 1024     Subjective Pt reports he is feeling much better about his progress.    Pertinent History Chest pain, dissection of descending thoracic aorta, aneurysm of artery, a-fib, HTN, systolic CHF, oral phase dysphagia, sacral wound, PNA    Patient Stated Goals Improve hand function and walk to be more independent with daily activities.  Gain strength in legs to be able to lift hips and reduce shear on sacrum.    Currently in Pain? No/denies                               [facility] Adult PT Treatment/Exercise - 06/15/21 1024       Bed Mobility   Bed Mobility Sit to Sidelying Left;Left Sidelying to Sit;Rolling Right;Rolling Left    Rolling Right Supervision/verbal cueing   cued to slowly bend up contralateral leg and then reach  across with rolling   Rolling Left Supervision/Verbal cueing    Left Sidelying to Sit Maximal Assistance - Patient 25-49%   breaking down to get pt to flex knees and then slide off and push up with left elbow. Had trouble with keeping legs flexed with extensor tone kicking in. 3 trials to rise.   Sit to Sidelying Left Minimal Assistance - Patient > 75%   to get legs on mat as tended to hook under mat but pt able to lift legs     Transfers   Transfers Squat Pivot Transfers;Stand to Sit;Sit to Stand    Sit to Stand 1: +2 Total assist   min assist from 2 PTs with blocking legs in front.   Sit to Stand Details Verbal cues for technique    Sit to Stand Details (indicate cue type and reason) Pt was cued to lean forward prior to transfer to bear weight through legs. PT blocked feet to prevent kicking out. Pt utilized arms once on mat to help with scoot to the left.    Stand to Sit 1: +2 Total assist    Stand to Sit Details (indicate cue type and reason) Verbal cues for technique    Stand to Sit Details Pt was cued  to lean forward and stick out bottom with unlocking knees first. Cued to control movement.    Squat Pivot Transfers 3: Mod assist    Squat Pivot Transfer Details (indicate cue type and reason) w/c to mat with verbal cues to lean forward. PT blocking feet in front and had pt reposition feet as scooted along during transfer.    Comments Pt stood x 5 min with +2 assist of PTs blocking knees in front for safety and providing assist at trunk to stabilize min/mod assist. Had mirror in front for patient to try to self correct. Performed weight shifting side to side x 5. PTs tried to assist to unlock knees during standing. Performed partial squats x 2. Then performed sit to stand x 3 focusing on grading movements min assist of 2.      Therapeutic Activites    Therapeutic Activities Other Therapeutic Activities    Other Therapeutic Activities Performed coming down on forearm on left and back up to  sitting x 5.      Exercises   Exercises Other Exercises    Other Exercises  Pt reports he does his glut sets, modified crunches in bed and chair, leg exercises. Also continues to work on [employer]                     PT Education - 06/15/21 1717     Education Details Discussed process towards goals    Person(s) Educated Patient    Methods Explanation    Comprehension Verbalized understanding              PT Short Term Goals - 05/18/21 1324       PT SHORT TERM GOAL #1   Title = LTG for 30 days 1/22, 60 days 2/21, 90 days 3/23               PT Long Term Goals - 06/15/21 1718       PT LONG TERM GOAL #1   Title Pt and wife will demonstrate independence with progressed HEP (LTG set at 30 days - reset to 60 days 2/21 and 90 days 3/23)    Baseline 06/15/21 PT continues to update. Pt has been performing current one.    Time 4    Period Weeks    Status On-going    Target Date 06/17/21      PT LONG TERM GOAL #2   Title Pt will demonstrate ability to perform supine <> sit on flat mat with min A    Baseline mod-max A. 06/15/21 min assist with sit to supine at feet, max assist with supine to sit    Time 4    Period Weeks    Status On-going    Target Date 06/17/21      PT LONG TERM GOAL #3   Title Pt will perform squat-scooting transfers w/c <> mat consistently to L and R on level surface with mod A    Baseline mod-max A. 06/15/21 mod/max assist but performing consistently with caregiver at home now instead of [facility]    Time 4    Period Weeks    Status On-going    Target Date 06/17/21      PT LONG TERM GOAL #4   Title Pt will demonstrate ability to perform sit > stand from elevated mat with mod A, maintain prolonged standing with UE support x 5 minutes and return to sitting with max A for controlled descent    Baseline 06/15/21  pt is total assist for sit to stand as needs 2 people but only min assist from each person. Ability does vary if knee buckle. Was  able to stand for 5 min with min/mod assist of 2. Min assist of +2 (so total assist) to sit.    Time 4    Period Weeks    Status On-going    Target Date 06/17/21             Jonette PesaUpdatedf PT goals:  PT Long Term Goals - 06/15/21 1731       PT LONG TERM GOAL #1   Title Pt and wife will demonstrate independence with progressed HEP (LTG set at 30 days - reset to 60 days 2/21 and 90 days 3/23)    Baseline 06/15/21 PT continues to update. Pt has been performing current one.    Time 4    Period Weeks    Status On-going    Target Date 07/17/21      PT LONG TERM GOAL #2   Title Pt will demonstrate ability to perform supine <> sit on flat mat with min A    Baseline mod-max A. 06/15/21 min assist with sit to supine at feet, max assist with supine to sit    Time 4    Period Weeks    Status On-going    Target Date 07/17/21      PT LONG TERM GOAL #3   Title Pt will perform squat-scooting transfers w/c <> mat consistently to L and R on level surface with mod A    Baseline mod-max A. 06/15/21 mod/max assist but performing consistently with caregiver at home now instead of [facility]    Time 4    Period Weeks    Status On-going    Target Date 07/17/21      PT LONG TERM GOAL #4   Title Pt will be able to perform sit to/from stand mod assist of 1 person.    Baseline 06/15/21 pt is total assist for sit to stand as needs 2 people but only min assist from each person. Ability does vary if knee buckle. Was able to stand for 5 min with min/mod assist of 2. Min assist of +2 (so total assist) to sit.    Time 4    Period Weeks    Status Revised      PT LONG TERM GOAL #5   Title Pt will be able to maintain standing >5 min mod assist of 1 person with appropriate braces on legs for improved balance and functional strength.    Baseline 06/15/21 currently 5 min total asist (as 2 people min/mod assist to help)    Time 4    Period Weeks    Status New    Target Date 07/17/21                  Plan -  06/15/21 1724     Clinical Impression Statement PT assessed goals today. Pt continues to show progress. He is requiring less assistance with squat pivot transfer more consistently of mod/max assist. He is now performing at home with wife instead of [place]. PT focused on slowly down movements with bed mobility to grade his movements and is requiring less assistance with sit to sidelying with min assist to make sure legs clear on to mat. Still max assist with sidelying to sit. Pt was able to increase standing time to 5 min and is requiring less assistance to transfer sit to stand  from 2 people being mostly min assist with sit to from stand when focuses on controlling movements. Pt continues to benefit from skilled PT to continue to progress towards goals.    Comorbidities Chest pain, dissection of descending thoracic aorta, aneurysm of artery, a-fib, HTN, systolic CHF, oral phase dysphagia, sacral wound, PNA    Rehab Potential Good    PT Frequency 3x / week    PT Duration 12 weeks    PT Treatment/Interventions ADLs/Self Care Home Management;DME Instruction;Gait training;Stair training;Functional mobility training;Therapeutic activities;Therapeutic exercise;Balance training;Neuromuscular re-education;Wheelchair mobility training;Orthotic Fit/Training;Patient/family education;Energy conservation    PT Next Visit Plan Sit <> squat training and increase use of RLE to pivot to L; does pretty good going to R.  Left anterior [place] AFO and right anterior o[place]obock that has medial post was most comfortable for standing.  Continue to work on grading movements with transfers and all activities. Standing with +2 assist or try Maralyn Sago again.  MONITOR BP: Keep SBP between 120-130! SCI Fit in wheelchair with LE only and belt around thighs.    Consulted and Agree with Plan of Care Patient             Patient will benefit from skilled therapeutic intervention in order to improve the following deficits and impairments:   Cardiopulmonary status limiting activity, Decreased activity tolerance, Decreased balance, Decreased coordination, Decreased endurance, Decreased mobility, Decreased strength, Difficulty walking, Impaired sensation, Impaired tone, Impaired UE functional use, Postural dysfunction, Pain, Abnormal gait  Visit Diagnosis: Other abnormalities of gait and mobility     Problem List Patient Active Problem List   Diagnosis Date Noted   Acute on chronic respiratory failure with hypoxia (HCC)    Acute inflammatory demyelinating polyneuropathy (HCC)    Dysautonomia (HCC)    Atrial fibrillation with RVR (HCC)    Healthcare associated bacterial pneumonia     Ronn Melena, PT, DPT, NCS 06/15/2021, 5:31 PM  [facility] [address] [place], [place], [address] Phone: [phone]   Fax:  [phone]  Name: Brian Espinoza MRN: [id number] Date of Birth: [birth year]

## 2021-06-18 ENCOUNTER — Ambulatory Visit: Payer: Medicare Other | Admitting: Physical Therapy

## 2021-06-18 ENCOUNTER — Other Ambulatory Visit: Payer: Self-pay

## 2021-06-18 ENCOUNTER — Encounter: Payer: Self-pay | Admitting: Occupational Therapy

## 2021-06-18 ENCOUNTER — Ambulatory Visit: Payer: Medicare Other | Admitting: Occupational Therapy

## 2021-06-18 DIAGNOSIS — R208 Other disturbances of skin sensation: Secondary | ICD-10-CM

## 2021-06-18 DIAGNOSIS — R2681 Unsteadiness on feet: Secondary | ICD-10-CM

## 2021-06-18 DIAGNOSIS — R278 Other lack of coordination: Secondary | ICD-10-CM

## 2021-06-18 DIAGNOSIS — M6281 Muscle weakness (generalized): Secondary | ICD-10-CM

## 2021-06-18 DIAGNOSIS — M25641 Stiffness of right hand, not elsewhere classified: Secondary | ICD-10-CM

## 2021-06-18 DIAGNOSIS — R2689 Other abnormalities of gait and mobility: Secondary | ICD-10-CM

## 2021-06-18 DIAGNOSIS — M25642 Stiffness of left hand, not elsewhere classified: Secondary | ICD-10-CM

## 2021-06-18 DIAGNOSIS — R295 Transient paralysis: Secondary | ICD-10-CM

## 2021-06-18 NOTE — Therapy (Signed)
[facility] [address] [place], [place], [address] Phone: [phone]   Fax:  [phone]  Occupational Therapy Treatment  Patient Details  Name: Zacory Fiola MRN: [id number] Date of Birth: Mar 24, 1956 Referring Provider (OT): Aundra Dubin (Will send certification to PCP Dietrich)   Encounter Date: 06/18/2021   OT End of Session - 06/18/21 1553     Visit Number 24    Number of Visits 37    Date for OT Re-Evaluation 06/20/21    Authorization Type Medicare and Federal BCBS    Progress Note Due on Visit 30    OT Start Time 1535    OT Stop Time 1615    OT Time Calculation (min) 40 min    Activity Tolerance Patient tolerated treatment well    Behavior During Therapy WFL for tasks assessed/performed             Past Medical History:  Diagnosis Date   Acute inflammatory demyelinating polyneuropathy ([place])    Acute on chronic respiratory failure with hypoxia (HCC)    Atrial fibrillation with RVR ([place])    Dysautonomia ([place])    Healthcare associated bacterial pneumonia     History reviewed. No pertinent surgical history.  There were no vitals filed for this visit.   Subjective Assessment - 06/18/21 1553     Subjective  Brought my splints the right one the pinky keeps coming out of it    Currently in Pain? No/denies    Pain Score 0-No pain             Self Care pt brought in [place] with foam grip handle and dycem from home and demontrated ability to bring up to mouth independently and catch straw for sipping x 4.  Splint Modifications modifications made to RUE resting hand splint with foam pads for assisting with maintaining hand position  UB dressing donned XL polo shirt with mod A for pulling shirt overhead and down in back. Max A For doffing.  All completed at edge of mat.  Transfer to tilt in space chair with min A for management of LLE.  UE Ranger with BUE  (individually) with forward reaching and with isometric hold with resistance with ability to maintain and withstand mod resistnace.                         OT Short Term Goals - 06/06/21 1220       OT SHORT TERM GOAL #1   Title Patient and caregiver will complete a home exercise program designed to improve strength in proximal UE's - shoulders and elbows    Time 4    Period Weeks    Status Achieved   issued AROM exercises and gentle theraband tricep strengthening 04/30/21   Target Date 04/21/21      OT SHORT TERM GOAL #2   Title Following set up patient will hold a cup with lid and long straw in midline to allow him to take a sip on his own    Time 4    Period Weeks    Status Partially Met   completed with simulation in clinic on 04/30/21 - continue to assess for consistency     OT SHORT TERM GOAL #3   Title While seated in upright supported position, patient will reach forward to make contact with upright target on table top (chest height) x 5 each arm    Time  4    Period Weeks    Status Achieved      OT SHORT TERM GOAL #4   Title Patient will be able to reach toward face with either right or left hand in preparation to scratch chin/cheek    Time 4    Period Weeks    Status Achieved      OT SHORT TERM GOAL #5   Title Patient will utilize cylindrical grip to hold small water bottle in one hand.    Time 4    Period Weeks    Status Partially Met   demonstrated holding bottle in RUE however not consistently 04/30/21              OT Long Term Goals - 06/08/21 1224       OT LONG TERM GOAL #1   Title Patient will complete an updated stretching program to prevent contracture in digits    Time 12    Period Weeks    Status On-going      OT LONG TERM GOAL #2   Title Patient and caregiver will demonstrate awareness of splint wear and care for BUE    Time 12    Period Weeks    Status On-going   continue to [employer] and address PRN     OT LONG TERM GOAL  #3   Title Patient will wash his face and upper body and upper legs with min assist    Time 12    Period Weeks    Status On-going   continuing to progress towards this goal     OT LONG TERM GOAL #4   Title Patient will actively bridge in bed to assist with pericare as needed    Time 12    Period Weeks    Status On-going   continuing to progress towards goal     OT LONG TERM GOAL #5   Title Patient will transfer to adapted commode with max assist in prep for toileting    Time 12    Period Weeks    Status On-going   working on troubleshooting [facility] and transfers for continuing to progess towards this goal     OT LONG TERM GOAL #6   Title Patient will transfer into/ out of shower with mod assist, and min assist for seated control during shower    Time 12    Period Weeks    Status On-going   much improvement with control and working towards goal                  Plan - 06/18/21 1714     Clinical Impression Statement Pt demonstrating increased indepedence with ADls today.    OT Occupational Profile and History Comprehensive Assessment- Review of records and extensive additional review of physical, cognitive, psychosocial history related to current functional performance    Occupational performance deficits (Please refer to evaluation for details): ADL's;IADL's;Rest and Sleep;Leisure    Body Structure / Function / Physical Skills ADL;Decreased knowledge of use of DME;Strength;Balance;Dexterity;GMC;Pain;Tone;Body mechanics;Proprioception;UE functional use;Cardiopulmonary status limiting activity;Endurance;IADL;ROM;Fascial restriction;Improper spinal/pelvic alignment;Coordination;Flexibility;Mobility;Sensation;Wound;FMC;Muscle spasms;Skin integrity    Rehab Potential Good    Clinical Decision Making Multiple treatment options, significant modification of task necessary    Comorbidities Affecting Occupational Performance: Presence of comorbidities impacting occupational performance     Comorbidities impacting occupational performance description: AIDP, Chronic back pain, Descending thoracic aortic anneurysm    Modification or Assistance to Complete Evaluation  Min-Moderate modification of tasks or assist with assess necessary  to complete eval    OT Frequency 3x / week    OT Duration 12 weeks    OT Treatment/Interventions Self-care/ADL training;Moist Heat;Fluidtherapy;DME and/or AE instruction;Splinting;Balance training;Aquatic Therapy;Contrast Bath;Therapeutic activities;Ultrasound;Therapeutic exercise;Passive range of motion;Functional Mobility Training;Neuromuscular education;Electrical Stimulation;Manual Therapy;Patient/family education;Energy conservation    Plan Continue strength / coordination UE's functional use of UE's, transfers, recert 0/80    Consulted and Agree with Plan of Care Patient             Patient will benefit from skilled therapeutic intervention in order to improve the following deficits and impairments:   Body Structure / Function / Physical Skills: ADL, Decreased knowledge of use of DME, Strength, Balance, Dexterity, GMC, Pain, Tone, Body mechanics, Proprioception, UE functional use, Cardiopulmonary status limiting activity, Endurance, IADL, ROM, Fascial restriction, Improper spinal/pelvic alignment, Coordination, Flexibility, Mobility, Sensation, Wound, FMC, Muscle spasms, Skin integrity       Visit Diagnosis: Other abnormalities of gait and mobility  Muscle weakness (generalized)  Stiffness of right hand, not elsewhere classified  Other disturbances of skin sensation  Stiffness of left hand, not elsewhere classified  Other lack of coordination  Unsteadiness on feet    Problem List Patient Active Problem List   Diagnosis Date Noted   Acute on chronic respiratory failure with hypoxia (HCC)    Acute inflammatory demyelinating polyneuropathy ([place])    Dysautonomia ([place])    Atrial fibrillation with RVR [facility])    Healthcare  associated bacterial pneumonia     Zachery Conch, OT 06/18/2021, 5:14 PM  [facility] [address] [place], [place], [address] Phone: [phone]   Fax:  [phone]  Name: Sohil Timko MRN: [id number] Date of Birth: 25-Feb-1956

## 2021-06-18 NOTE — Therapy (Signed)
[facility] [address] [place], [place], [address] Phone: [phone]   Fax:  [phone]  Physical Therapy Treatment  Patient Details  Name: Brian Espinoza MRN: [id number] Date of Birth: February 19, 1956 Referring Provider (PT): Alison MurrayMcLean, Stanislav, MD   Encounter Date: 06/18/2021   PT End of Session - 06/18/21 1551     Visit Number 25    Number of Visits 52    Date for PT Re-Evaluation 08/16/21    Authorization Type Medicare A&B; BCBS - 10th visit PN    Progress Note Due on Visit 30    PT Start Time 1445    PT Stop Time 1530    PT Time Calculation (min) 45 min    Activity Tolerance Patient tolerated treatment well    Behavior During Therapy [facility] for tasks assessed/performed             Past Medical History:  Diagnosis Date   Acute inflammatory demyelinating polyneuropathy (HCC)    Acute on chronic respiratory failure with hypoxia (HCC)    Atrial fibrillation with RVR (HCC)    Dysautonomia (HCC)    Healthcare associated bacterial pneumonia     No past surgical history on file.  There were no vitals filed for this visit.   Subjective Assessment - 06/18/21 1447     Subjective Weekend was uneventful.  Wife called about transportation; it will continue.  Hip pain at night; usually starts after leaving therapy.  Keeps him up at night; has to constantly move his leg at night to ease the ache.  L hip.  Sleeps on his back; not able to roll at night to change position due to fear of rolling/fall off bed.  Wife does stretches with him before bed.  Pt wondering why he hasn't had a follow up with a physiatrist or neurologist after D/C from hospital.    Pertinent History Chest pain, dissection of descending thoracic aorta, aneurysm of artery, a-fib, HTN, systolic CHF, oral phase dysphagia, sacral wound, PNA    Patient Stated Goals Improve hand function and walk to be more independent with daily activities.  Gain strength in legs to  be able to lift hips and reduce shear on sacrum.    Currently in Pain? Yes               [facility] Adult PT Treatment/Exercise - 06/18/21 1502       Bed Mobility   Bed Mobility Supine to Sit;Rolling Right;Rolling Left;Sit to Supine    Rolling Right Supervision/verbal cueing   multiple times; cues for head turns/gaze to R, rotating knees without lifting feet, and reaching to complete roll; added resistance to LE to increase activation.  Reversed sequence to return to back.   Rolling Left Supervision/Verbal cueing   cues for head turns/gaze to R, rotating knees without lifting feet, and reaching to complete roll; added resistance to LE to increase activation.   Supine to Sit Moderate Assistance - Patient 50-74%   cues to keep trunk forwards, cues to bring each LE down to floor and to keep pelvis descending when bringing head and trunk upright   Sit to Supine Moderate Assistance - Patient 50-74%   assist to bring LE onto mat     Transfers   Transfers Squat Pivot Transfers;Sit to Supine    Squat Pivot Transfers 3: Mod assist    Squat Pivot Transfer Details (indicate cue type and reason) w/c > mat to L with one pivot with PT assisting to keep  COG forwards.  Second pivot to scoot further down mat    Lateral/Scoot Transfers 3: Mod assist      Self-Care   Self-Care Other Self-Care Comments    Other Self-Care Comments  Discussed ways to seek out referral and estabilishing care with physiatrist or neurologist: [facility] or request referral from PCP to neurology.  Pt states he will discuss with PCP.      Exercises   Exercises Other Exercises    Other Exercises  In supine reviewed LE exercises pt and wife are doing at night: performing hamstring stretch and single knee to chest.  Also performed with pt adductor stretch, hip IR/ER with hip and knee flexion and performing L piriformis stretch.  Pt reporting significant improvement in hip pain with new stretches; requested pt have  wife come in for further stretch program education.      Knee/Hip Exercises: Aerobic   Other Aerobic SciFit x 5 minutes at level 2.5 with hand splints to maintain UE grip on handles.  Belt around LE to keep hips in neutral rotation.  Pt able to keep steps between 89-90 per minute.  Reports improvement in hip pain with constant movement of L hip                     PT Education - 06/18/21 1546     Education Details how to obtain referral for physiatrist or neurologist for follow up/management; stretches to perform before sleep    Person(s) Educated Patient    Methods Explanation    Comprehension Verbalized understanding;Need further instruction              PT Short Term Goals - 05/18/21 1324       PT SHORT TERM GOAL #1   Title = LTG for 30 days 1/22, 60 days 2/21, 90 days 3/23               PT Long Term Goals - 06/15/21 1731       PT LONG TERM GOAL #1   Title Pt and wife will demonstrate independence with progressed HEP (LTG set at 30 days - reset to 60 days 2/21 and 90 days 3/23)    Baseline 06/15/21 PT continues to update. Pt has been performing current one.    Time 4    Period Weeks    Status On-going    Target Date 07/17/21      PT LONG TERM GOAL #2   Title Pt will demonstrate ability to perform supine <> sit on flat mat with min A    Baseline mod-max A. 06/15/21 min assist with sit to supine at feet, max assist with supine to sit    Time 4    Period Weeks    Status On-going    Target Date 07/17/21      PT LONG TERM GOAL #3   Title Pt will perform squat-scooting transfers w/c <> mat consistently to L and R on level surface with mod A    Baseline mod-max A. 06/15/21 mod/max assist but performing consistently with caregiver at home now instead of [place]    Time 4    Period Weeks    Status On-going    Target Date 07/17/21      PT LONG TERM GOAL #4   Title Pt will be able to perform sit to/from stand mod assist of 1 person.    Baseline 06/15/21 pt  is total assist for sit to stand  as needs 2 people but only min assist from each person. Ability does vary if knee buckle. Was able to stand for 5 min with min/mod assist of 2. Min assist of +2 (so total assist) to sit.    Time 4    Period Weeks    Status Revised      PT LONG TERM GOAL #5   Title Pt will be able to maintain standing >5 min mod assist of 1 person with appropriate braces on legs for improved balance and functional strength.    Baseline 06/15/21 currently 5 min total asist (as 2 people min/mod assist to help)    Time 4    Period Weeks    Status New    Target Date 07/17/21                   Plan - 06/18/21 1552     Clinical Impression Statement Continued to utilize SciFit for UE and LE strengthening and aerobic training; will continue to gradually increase time and resistance as pt improves.  Pt continues to require assistance to maintain forward weight shift over BOS during squat pivot transfers.  Pt demonstrated improved control of LE and foot placement when performing bed mobility when provided visual cues for foot placement and external resistance added to slow down patient's movement.  Due to progress and ongoing hip pain, patient may benefit from update of HEP next session.    Comorbidities Chest pain, dissection of descending thoracic aorta, aneurysm of artery, a-fib, HTN, systolic CHF, oral phase dysphagia, sacral wound, PNA    Rehab Potential Good    PT Frequency 3x / week    PT Duration 12 weeks    PT Treatment/Interventions ADLs/Self Care Home Management;DME Instruction;Gait training;Stair training;Functional mobility training;Therapeutic activities;Therapeutic exercise;Balance training;Neuromuscular re-education;Wheelchair mobility training;Orthotic Fit/Training;Patient/family education;Energy conservation    PT Next Visit Plan Progress SciFit time past 5 minutes.  Update HEP and review stretches added this visit with wife for hip pain management.  Sit <> squat  training and increase use of RLE to pivot to L; does pretty good going to R.  Left anterior [place] AFO and right anterior o[place]obock that has medial post was most comfortable for standing.  Continue to work on grading movements with sit <> supine and rolling. Standing with +2 assist or try [place] again.  MONITOR BP: Keep SBP between 120-130!    Consulted and Agree with Plan of Care Patient             Patient will benefit from skilled therapeutic intervention in order to improve the following deficits and impairments:  Cardiopulmonary status limiting activity, Decreased activity tolerance, Decreased balance, Decreased coordination, Decreased endurance, Decreased mobility, Decreased strength, Difficulty walking, Impaired sensation, Impaired tone, Impaired UE functional use, Postural dysfunction, Pain, Abnormal gait  Visit Diagnosis: Other abnormalities of gait and mobility  Muscle weakness (generalized)  Transient paralysis  Other disturbances of skin sensation  Unsteadiness on feet     Problem List Patient Active Problem List   Diagnosis Date Noted   Acute on chronic respiratory failure with hypoxia (HCC)    Acute inflammatory demyelinating polyneuropathy (HCC)    Dysautonomia (HCC)    Atrial fibrillation with RVR (HCC)    Healthcare associated bacterial pneumonia    Dierdre Highman, PT, DPT 06/18/21    3:57 PM    [facility] [address] [place], [place], [address] Phone: [phone]   Fax:  [phone]  Name: Brian Espinoza MRN: [id number] Date of Birth: 05-31-1955

## 2021-06-18 NOTE — Patient Instructions (Addendum)
Access Code: [email or web address] URL: [email or web address] Date: 06/18/2021 Prepared by: Bufford Lope  Exercises Supine Bridge - 1 x daily - 5 x weekly - 2 sets - 10 reps Hip Abduction and Adduction Caregiver PROM - 1 x daily - 7 x weekly - 2 sets - 10 reps Supine Hip and Knee Flexion PROM with Caregiver - 1 x daily - 7 x weekly - 2 sets - 10 reps Hip Internal and External Rotation Caregiver PROM - 1 x daily - 7 x weekly - 2 sets - 10 reps Hip Flexion with Knee Extension Caregiver PROM - 1 x daily - 7 x weekly - 2 sets - 30 second hold Supine Piriformis Stretch with Leg Straight - 1 x daily - 7 x weekly - 2 sets - 30 second hold

## 2021-06-20 ENCOUNTER — Other Ambulatory Visit: Payer: Self-pay

## 2021-06-20 ENCOUNTER — Ambulatory Visit: Payer: Medicare Other | Admitting: Occupational Therapy

## 2021-06-20 ENCOUNTER — Encounter: Payer: Self-pay | Admitting: Occupational Therapy

## 2021-06-20 ENCOUNTER — Ambulatory Visit: Payer: Medicare Other

## 2021-06-20 DIAGNOSIS — R2681 Unsteadiness on feet: Secondary | ICD-10-CM

## 2021-06-20 DIAGNOSIS — R278 Other lack of coordination: Secondary | ICD-10-CM

## 2021-06-20 DIAGNOSIS — M25641 Stiffness of right hand, not elsewhere classified: Secondary | ICD-10-CM

## 2021-06-20 DIAGNOSIS — R293 Abnormal posture: Secondary | ICD-10-CM

## 2021-06-20 DIAGNOSIS — M6281 Muscle weakness (generalized): Secondary | ICD-10-CM

## 2021-06-20 DIAGNOSIS — M25642 Stiffness of left hand, not elsewhere classified: Secondary | ICD-10-CM

## 2021-06-20 DIAGNOSIS — R2689 Other abnormalities of gait and mobility: Secondary | ICD-10-CM

## 2021-06-20 DIAGNOSIS — R208 Other disturbances of skin sensation: Secondary | ICD-10-CM

## 2021-06-20 NOTE — Therapy (Signed)
[facility] [address] [place], [place], [address] Phone: [phone]   Fax:  [phone]  Occupational Therapy Treatment and Recertification  Patient Details  Name: Brian Espinoza MRN: [id number] Date of Birth: 1955/06/28 Referring Provider (OT): Aundra Dubin (Will send certification to PCP [place])   Encounter Date: 06/20/2021   OT End of Session - 06/20/21 1418     Visit Number 25    Number of Visits 37    Date for OT Re-Evaluation 09/18/21    Authorization Type Medicare and Federal BCBS    Progress Note Due on Visit 30    OT Start Time 1230    OT Stop Time 1315    OT Time Calculation (min) 45 min    Equipment Utilized During Treatment transfer board, equalizer gym, UBE    Activity Tolerance Patient tolerated treatment well    Behavior During Therapy WFL for tasks assessed/performed             Past Medical History:  Diagnosis Date   Acute inflammatory demyelinating polyneuropathy ([place])    Acute on chronic respiratory failure with hypoxia (HCC)    Atrial fibrillation with RVR ([place])    Dysautonomia ([place])    Healthcare associated bacterial pneumonia     History reviewed. No pertinent surgical history.  There were no vitals filed for this visit.   Subjective Assessment - 06/20/21 1413     Subjective  I got a cup with a handle and I can drink with it!  I only spilled a few times.  If my wife cuts up my food, I can spear it and bring it to my mouth - its not pretty.    Patient is accompanied by: Family member    Currently in Pain? No/denies    Pain Score 0-No pain                          OT Treatments/Exercises (OP) - 06/20/21 1414       ADLs   Eating Patient has been drinking from closed cup with straw at home, and attempting to use utensil to feed himself at meals.    Functional Mobility Patient transferred to wheelchair from mat table with min assist  to ensure adequate weight shift forward to load feet.  Patient able to close seatbelt buckle today with increased time and intermittent min assist.      Neurological Re-education Exercises   Other Exercises 1 Worked on universal gym. Patient able to complete upright row, and chest press.  Helpful to use active hand supports to ensure he maintained hand on bar.  Patient able to complete 15 reps x 2 unilaterally then bilaterally with 10lb resistance (lowest setting)    Other Exercises 2 UBE x 5 min with use of active hands to ensure grip on hand pedal.  Patient able to sit forward in his chair and remian active in BUE at level 3.0 resistance.                      OT Short Term Goals - 06/20/21 1425       OT SHORT TERM GOAL #1   Title Patient and caregiver will complete a home exercise program designed to improve strength in proximal UE's - shoulders and elbows    Time 4    Period Weeks    Status Achieved   issued AROM exercises and gentle theraband  tricep strengthening 04/30/21   Target Date 04/21/21      OT SHORT TERM GOAL #2   Title Following set up patient will hold a cup with lid and long straw in midline to allow him to take a sip on his own    Time 4    Period Weeks    Status Partially Met   completed with simulation in clinic on 04/30/21 - continue to assess for consistency     OT SHORT TERM GOAL #3   Title While seated in upright supported position, patient will reach forward to make contact with upright target on table top (chest height) x 5 each arm    Time 4    Period Weeks    Status Achieved      OT SHORT TERM GOAL #4   Title Patient will be able to reach toward face with either right or left hand in preparation to scratch chin/cheek    Time 4    Period Weeks    Status Achieved      OT SHORT TERM GOAL #5   Title Patient will utilize cylindrical grip to hold small water bottle in one hand.    Time 4    Period Weeks    Status Partially Met   demonstrated  holding bottle in RUE however not consistently 04/30/21              OT Long Term Goals - 06/20/21 1425       OT LONG TERM GOAL #1   Title Patient will complete an updated stretching program to prevent contracture in digits    Time 12    Period Weeks    Status Achieved      OT LONG TERM GOAL #2   Title Patient and caregiver will demonstrate awareness of splint wear and care for BUE    Time 12    Period Weeks    Status Achieved   continue to modifiy and address PRN     OT LONG TERM GOAL #3   Title Patient will wash his face and upper body and upper legs with min assist    Time 12    Period Weeks    Status On-going   continuing to progress towards this goal     OT LONG TERM GOAL #4   Title Patient will actively bridge in bed to assist with pericare as needed    Time 12    Period Weeks    Status Partially Met   continuing to progress towards goal     OT LONG TERM GOAL #5   Title Patient will transfer to adapted commode with max assist in prep for toileting    Time 12    Period Weeks    Status On-going   working on troubleshooting [facility] and transfers for continuing to progess towards this goal     OT LONG TERM GOAL #6   Title Patient will transfer into/ out of shower with mod assist, and min assist for seated control during shower    Time 12    Period Weeks    Status On-going   much improvement with control and working towards goal     OT LONG TERM GOAL #7   Title Patient will feed himself 30% meal using utensils after set up    Time 12    Period Weeks    Status New    Target Date 09/18/21      OT LONG TERM GOAL #8  Title Patient will assist with pulling pants over hips (up/down) to aide with toileting    Time 12    Period Weeks    Status New    Target Date 09/18/21                   Plan - 06/20/21 1419     Clinical Impression Statement This progress report covers dates of service from 06/08/21-06/20/21.  Patient is making steady progress with his  rehab, although started from a very low level of functioning.  Patient is now actively participating in several ADL activities, he is actively transferring with his wife (no longer using lift) he is in a new wheelchair, and his sacral wound is healing per patient and family.  Patient will continue to benefit from additional OT to further improve functional strength, range of motion and functional use of BUE.    OT Occupational Profile and History Comprehensive Assessment- Review of records and extensive additional review of physical, cognitive, psychosocial history related to current functional performance    Occupational performance deficits (Please refer to evaluation for details): ADL's;IADL's;Rest and Sleep;Leisure    Body Structure / Function / Physical Skills ADL;Decreased knowledge of use of DME;Strength;Balance;Dexterity;GMC;Pain;Tone;Body mechanics;Proprioception;UE functional use;Cardiopulmonary status limiting activity;Endurance;IADL;ROM;Fascial restriction;Improper spinal/pelvic alignment;Coordination;Flexibility;Mobility;Sensation;Wound;FMC;Muscle spasms;Skin integrity    Rehab Potential Good    Clinical Decision Making Multiple treatment options, significant modification of task necessary    Comorbidities Affecting Occupational Performance: Presence of comorbidities impacting occupational performance    Comorbidities impacting occupational performance description: AIDP, Chronic back pain, Descending thoracic aortic anneurysm    Modification or Assistance to Complete Evaluation  Min-Moderate modification of tasks or assist with assess necessary to complete eval    OT Frequency 3x / week    OT Duration 12 weeks    OT Treatment/Interventions Self-care/ADL training;Moist Heat;Fluidtherapy;DME and/or AE instruction;Splinting;Balance training;Aquatic Therapy;Contrast Bath;Therapeutic activities;Ultrasound;Therapeutic exercise;Passive range of motion;Functional Mobility Training;Neuromuscular  education;Electrical Stimulation;Manual Therapy;Patient/family education;Energy conservation    Plan Continue strength / coordination UE's functional use of UE's, transfers, recert 0/93    Consulted and Agree with Plan of Care Patient             Patient will benefit from skilled therapeutic intervention in order to improve the following deficits and impairments:   Body Structure / Function / Physical Skills: ADL, Decreased knowledge of use of DME, Strength, Balance, Dexterity, GMC, Pain, Tone, Body mechanics, Proprioception, UE functional use, Cardiopulmonary status limiting activity, Endurance, IADL, ROM, Fascial restriction, Improper spinal/pelvic alignment, Coordination, Flexibility, Mobility, Sensation, Wound, FMC, Muscle spasms, Skin integrity       Visit Diagnosis: Other disturbances of skin sensation - Plan: Ot plan of care cert/re-cert  Stiffness of right hand, not elsewhere classified - Plan: Ot plan of care cert/re-cert  Stiffness of left hand, not elsewhere classified - Plan: Ot plan of care cert/re-cert  Other lack of coordination - Plan: Ot plan of care cert/re-cert  Abnormal posture - Plan: Ot plan of care cert/re-cert  Unsteadiness on feet - Plan: Ot plan of care cert/re-cert  Muscle weakness (generalized) - Plan: Ot plan of care cert/re-cert    Problem List Patient Active Problem List   Diagnosis Date Noted   Acute on chronic respiratory failure with hypoxia (HCC)    Acute inflammatory demyelinating polyneuropathy (HCC)    Dysautonomia (HCC)    Atrial fibrillation with RVR ([place])    Healthcare associated bacterial pneumonia     Mariah Milling, OT 06/20/2021, 2:31 PM  [facility]  [place] [facility] [address] [place], [place], [address] Phone: [phone]   Fax:  [phone]  Name: Brian Espinoza MRN: [id number] Date of Birth: [birth year]

## 2021-06-20 NOTE — Patient Instructions (Addendum)
Access Code: [email or web address] URL: [email or web address] Date: 06/20/2021 Prepared by: Elmer Bales  Exercises Supine Bridge - 1 x daily - 5 x weekly - 2 sets - 10 reps Hip Abduction and Adduction Caregiver PROM - 1 x daily - 7 x weekly - 2 sets - 10 reps Supine Hip and Knee Flexion PROM with Caregiver - 1 x daily - 7 x weekly - 2 sets - 10 reps Hip Internal and External Rotation Caregiver PROM - 2-3 x daily - 7 x weekly - 1 sets - 3 reps - 1 min hold Hip Flexion with Knee Extension Caregiver PROM - 2-3 x daily - 7 x weekly - 1 sets - 3 reps - 30 sec-1 minond hold Supine Piriformis Stretch with Leg Straight - 2-3 x daily - 7 x weekly - 1 sets - 3 reps - 30 sec-1 minond hold

## 2021-06-20 NOTE — Therapy (Signed)
[facility] [address] [place], [place], [address] Phone: [phone]   Fax:  [phone]  Physical Therapy Treatment  Patient Details  Name: Brian Espinoza MRN: [id number] Date of Birth: 1955/09/12 Referring Provider (PT): Alison MurrayMcLean, [place], MD   Encounter Date: 06/20/2021   PT End of Session - 06/20/21 1147     Visit Number 26    Number of Visits 52    Date for PT Re-Evaluation 08/16/21    Authorization Type Medicare A&B; BCBS - 10th visit PN    Progress Note Due on Visit 30    PT Start Time 1145    PT Stop Time 1226    PT Time Calculation (min) 41 min    Activity Tolerance Patient tolerated treatment well    Behavior During Therapy WFL for tasks assessed/performed             Past Medical History:  Diagnosis Date   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 No pertinent surgical history.  There were no vitals filed for this visit.   Subjective Assessment - 06/20/21 1147     Subjective Pt reports that his hip has been doing better. Wife came to see the new exercises.    Pertinent History Chest pain, dissection of descending thoracic aorta, aneurysm of artery, a-fib, HTN, systolic CHF, oral phase dysphagia, sacral wound, PNA    Patient Stated Goals Improve hand function and walk to be more independent with daily activities.  Gain strength in legs to be able to lift hips and reduce shear on sacrum.    Currently in Pain? No/denies                               [facility] Adult PT Treatment/Exercise - 06/20/21 1148       Bed Mobility   Bed Mobility Sit to Supine;Rolling Left;Left Sidelying to Sit    Rolling Left Supervision/Verbal cueing   cues to slowly flex up right leg keeping foot on mat and  then reaching across with right arm to roll   Left Sidelying to Sit Maximal Assistance - Patient 25-49%   breaking down to get pt to flex knees and then slide off and push up with left elbow. Had trouble with keeping legs flexed with extensor tone kicking in. Cued to push with RUE on mat to help.   Sit to Supine Moderate Assistance - Patient 50-74%   at legs to clear table     Transfers   Transfers Squat Pivot Transfers    Squat Pivot Transfers 3: Mod assist    Squat Pivot Transfer Details (indicate cue type and reason) w/c to mat with scooting forward in chair first working on keeping knees flexed and sliding hip forward one side at a time. Then leaning forward over legs with PT blocking legs to prevent extension with assist to pivot one time to mat. Once on mat worked on scooting up towards head of mat with again leaning forward to bear some weight through legs. Cued to reposition legs each time.      Therapeutic Activites    Therapeutic Activities Other Therapeutic Activities    Other Therapeutic Activities Performed seated coming down on left forearm and then back up  with cues to push with RUE as well. PT maintained legs in flexed posture throughout. Staying foward more with using RUE helped pt to rise and decrease extensor tone.      Exercises   Exercises Other Exercises    Other Exercises  PT reviewed new supine hip stretches with pt and wife as noted below.             Performed on LLE with wife observing throughout 1 min x 3 each: Supine Hip and Knee Flexion PROM with Caregiver - 1 x daily - 7 x weekly - 2 sets - 10 reps Hip Internal and External Rotation Caregiver PROM - 2-3 x daily - 7 x weekly - 1 sets - 3 reps - 1 min hold Hip Flexion with Knee Extension Caregiver PROM - 2-3 x daily - 7 x weekly - 1 sets - 3 reps - 30 sec-1 minond hold Supine Piriformis Stretch with Leg Straight - 2-3 x daily - 7 x weekly - 1 sets - 3 reps - 30 sec-1 minond hold         PT Education -  06/20/21 1911     Education Details Demonstrated new stretching exercises for hips to pt's wife and reissued pictures of HEP    Person(s) Educated Patient;Spouse    Methods Explanation;Demonstration;Handout    Comprehension Verbalized understanding              PT Short Term Goals - 05/18/21 1324       PT SHORT TERM GOAL #1   Title = LTG for 30 days 1/22, 60 days 2/21, 90 days 3/23               PT Long Term Goals - 06/15/21 1731       PT LONG TERM GOAL #1   Title Pt and wife will demonstrate independence with progressed HEP (LTG set at 30 days - reset to 60 days 2/21 and 90 days 3/23)    Baseline 06/15/21 PT continues to update. Pt has been performing current one.    Time 4    Period Weeks    Status On-going    Target Date 07/17/21      PT LONG TERM GOAL #2   Title Pt will demonstrate ability to perform supine <> sit on flat mat with min A    Baseline mod-max A. 06/15/21 min assist with sit to supine at feet, max assist with supine to sit    Time 4    Period Weeks    Status On-going    Target Date 07/17/21      PT LONG TERM GOAL #3   Title Pt will perform squat-scooting transfers w/c <> mat consistently to L and R on level surface with mod A    Baseline mod-max A. 06/15/21 mod/max assist but performing consistently with caregiver at home now instead of [place]    Time 4    Period Weeks    Status On-going    Target Date 07/17/21      PT LONG TERM GOAL #4   Title Pt will be able to perform sit to/from stand mod assist of 1 person.    Baseline 06/15/21 pt is total assist for sit to stand as needs 2 people but only min assist from each person. Ability does vary if knee buckle. Was able to stand for 5 min with min/mod assist of 2. Min assist of +2 (so total assist) to sit.    Time 4  Period Weeks    Status Revised      PT LONG TERM GOAL #5   Title Pt will be able to maintain standing >5 min mod assist of 1 person with appropriate braces on legs for improved  balance and functional strength.    Baseline 06/15/21 currently 5 min total asist (as 2 people min/mod assist to help)    Time 4    Period Weeks    Status New    Target Date 07/17/21                   Plan - 06/20/21 1911     Clinical Impression Statement Session focused on educating wife on pt's new stretching exercises for hip. Reissued pictures for her as well. Pt reported hips feeling better today. Piriformis stretch felt the best. Also reviewed with wife what we are working on with bed mobility and slowing down movements to be more controlled.    Comorbidities Chest pain, dissection of descending thoracic aorta, aneurysm of artery, a-fib, HTN, systolic CHF, oral phase dysphagia, sacral wound, PNA    Rehab Potential Good    PT Frequency 3x / week    PT Duration 12 weeks    PT Treatment/Interventions ADLs/Self Care Home Management;DME Instruction;Gait training;Stair training;Functional mobility training;Therapeutic activities;Therapeutic exercise;Balance training;Neuromuscular re-education;Wheelchair mobility training;Orthotic Fit/Training;Patient/family education;Energy conservation    PT Next Visit Plan Progress SciFit time past 5 minutes.  Sit <> squat training and increase use of RLE to pivot to L; does pretty good going to R.  Left anterior [place] AFO and right anterior ottobock that has medial post was most comfortable for standing.  Continue to work on grading movements with sit <> supine and rolling. Standing with +2 assist or try [place] again.  MONITOR BP: Keep SBP between 120-130!    Consulted and Agree with Plan of Care Patient             Patient will benefit from skilled therapeutic intervention in order to improve the following deficits and impairments:  Cardiopulmonary status limiting activity, Decreased activity tolerance, Decreased balance, Decreased coordination, Decreased endurance, Decreased mobility, Decreased strength, Difficulty walking, Impaired sensation,  Impaired tone, Impaired UE functional use, Postural dysfunction, Pain, Abnormal gait  Visit Diagnosis: Other abnormalities of gait and mobility  Muscle weakness (generalized)     Problem List Patient Active Problem List   Diagnosis Date Noted   Acute on chronic respiratory failure with hypoxia (HCC)    Acute inflammatory demyelinating polyneuropathy (HCC)    Dysautonomia (HCC)    Atrial fibrillation with RVR (HCC)    Healthcare associated bacterial pneumonia     Ronn Melena, PT, DPT, NCS 06/20/2021, 7:14 PM  [facility] [address] [place], [place], [address] Phone: [phone]   Fax:  [phone]  Name: Brian Espinoza MRN: [id number] Date of Birth: 1956-04-02

## 2021-06-22 ENCOUNTER — Ambulatory Visit: Payer: Medicare Other

## 2021-06-22 ENCOUNTER — Encounter: Payer: Medicare Other | Admitting: Occupational Therapy

## 2021-06-22 ENCOUNTER — Ambulatory Visit: Payer: Medicare Other | Admitting: Occupational Therapy

## 2021-06-25 ENCOUNTER — Ambulatory Visit: Payer: Medicare Other | Admitting: Occupational Therapy

## 2021-06-25 ENCOUNTER — Other Ambulatory Visit: Payer: Self-pay

## 2021-06-25 ENCOUNTER — Encounter: Payer: Medicare Other | Admitting: Occupational Therapy

## 2021-06-25 ENCOUNTER — Ambulatory Visit: Payer: Medicare Other | Admitting: Physical Therapy

## 2021-06-25 VITALS — BP 109/60 | HR 60

## 2021-06-25 DIAGNOSIS — R2681 Unsteadiness on feet: Secondary | ICD-10-CM

## 2021-06-25 DIAGNOSIS — M6281 Muscle weakness (generalized): Secondary | ICD-10-CM

## 2021-06-25 DIAGNOSIS — R208 Other disturbances of skin sensation: Secondary | ICD-10-CM

## 2021-06-25 DIAGNOSIS — M25641 Stiffness of right hand, not elsewhere classified: Secondary | ICD-10-CM

## 2021-06-25 DIAGNOSIS — R2689 Other abnormalities of gait and mobility: Secondary | ICD-10-CM

## 2021-06-25 DIAGNOSIS — R278 Other lack of coordination: Secondary | ICD-10-CM | POA: Diagnosis not present

## 2021-06-25 DIAGNOSIS — R295 Transient paralysis: Secondary | ICD-10-CM

## 2021-06-25 DIAGNOSIS — M25642 Stiffness of left hand, not elsewhere classified: Secondary | ICD-10-CM

## 2021-06-25 NOTE — Therapy (Signed)
[facility] [address] [place], [place], [address] Phone: [phone]   Fax:  [phone]  Occupational Therapy Treatment  Patient Details  Name: Brian Espinoza MRN: [id number] Date of Birth: [birth year] Referring Provider (OT): Aundra Dubin (Will send certification to PCP [place])   Encounter Date: 06/25/2021   OT End of Session - 06/25/21 1147     Visit Number 26    Number of Visits 61   19+94 at recert 1/29   Date for OT Re-Evaluation 09/18/21    Authorization Type Medicare and Federal BCBS    Progress Note Due on Visit 30    OT Start Time 1100    OT Stop Time 1148    OT Time Calculation (min) 48 min    Equipment Utilized During Treatment transfer board, equalizer gym, UBE    Activity Tolerance Patient tolerated treatment well    Behavior During Therapy WFL for tasks assessed/performed             Past Medical History:  Diagnosis Date   Acute inflammatory demyelinating polyneuropathy ([place])    Acute on chronic respiratory failure with hypoxia (HCC)    Atrial fibrillation with RVR ([place])    Dysautonomia ([place])    Healthcare associated bacterial pneumonia     No past surgical history on file.  There were no vitals filed for this visit.   Subjective Assessment - 06/25/21 1105     Subjective  "I can pick up sliced apples but i can't hold spoon or fork, that's almost impossible." "i just need to get my hands better"    Patient is accompanied by: --    Currently in Pain? No/denies    Pain Score 0-No pain                          OT Treatments/Exercises (OP) - 06/25/21 1107       ADLs   Overall ADLs extensive conversation regarding progress, quality of life, building blocks towards progressing towards increased independence. Pt discouraged with speed of progress but was reassured of significant progress he has made with therapy during the past 12 weeks. Pt has  progressed significantly with increased independence with starting to feed self, increased indpeendence with transfers, bringing cup to mouth independently for drinking water, managing covers, etc. Pt continues to be discouraged with needing assistance with someone pushing his chair outside although resistant to discussion about power chair for increased independence.    Grooming pt reports washing face today      Neurological Re-education Exercises   Other Exercises 1 Universal Gym - scap retraction x 10 reps with 10 lbs individually BUE. use of active hand supports x 2 sets    Other Exercises 2 UBE x 6 minutes, 3 minutes forward and 3 minutes backwards with UE active supports.                      OT Short Term Goals - 06/20/21 1425       OT SHORT TERM GOAL #1   Title Patient and caregiver will complete a home exercise program designed to improve strength in proximal UE's - shoulders and elbows    Time 4    Period Weeks    Status Achieved   issued AROM exercises and gentle theraband tricep strengthening 04/30/21   Target Date 04/21/21      OT SHORT TERM GOAL #2  Title Following set up patient will hold a cup with lid and long straw in midline to allow him to take a sip on his own    Time 4    Period Weeks    Status Partially Met   completed with simulation in clinic on 04/30/21 - continue to assess for consistency     OT SHORT TERM GOAL #3   Title While seated in upright supported position, patient will reach forward to make contact with upright target on table top (chest height) x 5 each arm    Time 4    Period Weeks    Status Achieved      OT SHORT TERM GOAL #4   Title Patient will be able to reach toward face with either right or left hand in preparation to scratch chin/cheek    Time 4    Period Weeks    Status Achieved      OT SHORT TERM GOAL #5   Title Patient will utilize cylindrical grip to hold small water bottle in one hand.    Time 4    Period Weeks     Status Partially Met   demonstrated holding bottle in RUE however not consistently 04/30/21              OT Long Term Goals - 06/20/21 1425       OT LONG TERM GOAL #1   Title Patient will complete an updated stretching program to prevent contracture in digits    Time 12    Period Weeks    Status Achieved      OT LONG TERM GOAL #2   Title Patient and caregiver will demonstrate awareness of splint wear and care for BUE    Time 12    Period Weeks    Status Achieved   continue to modifiy and address PRN     OT LONG TERM GOAL #3   Title Patient will wash his face and upper body and upper legs with min assist    Time 12    Period Weeks    Status On-going   continuing to progress towards this goal     OT LONG TERM GOAL #4   Title Patient will actively bridge in bed to assist with pericare as needed    Time 12    Period Weeks    Status Partially Met   continuing to progress towards goal     OT LONG TERM GOAL #5   Title Patient will transfer to adapted commode with max assist in prep for toileting    Time 12    Period Weeks    Status On-going   working on troubleshooting [facility] and transfers for continuing to progess towards this goal     OT LONG TERM GOAL #6   Title Patient will transfer into/ out of shower with mod assist, and min assist for seated control during shower    Time 12    Period Weeks    Status On-going   much improvement with control and working towards goal     OT LONG TERM GOAL #7   Title Patient will feed himself 30% meal using utensils after set up    Time 12    Period Weeks    Status New    Target Date 09/18/21      OT LONG TERM GOAL #8   Title Patient will assist with pulling pants over hips (up/down) to aide with toileting    Time  12    Period Weeks    Status New    Target Date 09/18/21                   Plan - 06/25/21 1229     Clinical Impression Statement Pt continues to demonstrate significant progress with increasing  independence with ADLs.    OT Occupational Profile and History Comprehensive Assessment- Review of records and extensive additional review of physical, cognitive, psychosocial history related to current functional performance    Occupational performance deficits (Please refer to evaluation for details): ADL's;IADL's;Rest and Sleep;Leisure    Body Structure / Function / Physical Skills ADL;Decreased knowledge of use of DME;Strength;Balance;Dexterity;GMC;Pain;Tone;Body mechanics;Proprioception;UE functional use;Cardiopulmonary status limiting activity;Endurance;IADL;ROM;Fascial restriction;Improper spinal/pelvic alignment;Coordination;Flexibility;Mobility;Sensation;Wound;FMC;Muscle spasms;Skin integrity    Rehab Potential Good    Clinical Decision Making Multiple treatment options, significant modification of task necessary    Comorbidities Affecting Occupational Performance: Presence of comorbidities impacting occupational performance    Comorbidities impacting occupational performance description: AIDP, Chronic back pain, Descending thoracic aortic anneurysm    Modification or Assistance to Complete Evaluation  Min-Moderate modification of tasks or assist with assess necessary to complete eval    OT Frequency 3x / week    OT Duration 12 weeks    OT Treatment/Interventions Self-care/ADL training;Moist Heat;Fluidtherapy;DME and/or AE instruction;Splinting;Balance training;Aquatic Therapy;Contrast Bath;Therapeutic activities;Ultrasound;Therapeutic exercise;Passive range of motion;Functional Mobility Training;Neuromuscular education;Electrical Stimulation;Manual Therapy;Patient/family education;Energy conservation    Plan transfers, BUE strengtening with UE active supports, ADL independence - self feeding/hip bridge for LB Dressing, etc    Consulted and Agree with Plan of Care Patient             Patient will benefit from skilled therapeutic intervention in order to improve the following deficits  and impairments:   Body Structure / Function / Physical Skills: ADL, Decreased knowledge of use of DME, Strength, Balance, Dexterity, GMC, Pain, Tone, Body mechanics, Proprioception, UE functional use, Cardiopulmonary status limiting activity, Endurance, IADL, ROM, Fascial restriction, Improper spinal/pelvic alignment, Coordination, Flexibility, Mobility, Sensation, Wound, FMC, Muscle spasms, Skin integrity       Visit Diagnosis: Muscle weakness (generalized)  Other disturbances of skin sensation  Stiffness of right hand, not elsewhere classified  Stiffness of left hand, not elsewhere classified  Other lack of coordination    Problem List Patient Active Problem List   Diagnosis Date Noted   Acute on chronic respiratory failure with hypoxia (HCC)    Acute inflammatory demyelinating polyneuropathy ([place])    Dysautonomia ([place])    Atrial fibrillation with RVR [facility])    Healthcare associated bacterial pneumonia     Zachery Conch, OT 06/25/2021, 12:31 PM  [facility] [address] [place], [place], [address] Phone: [phone]   Fax:  [phone]  Name: Brian Espinoza MRN: [id number] Date of Birth: [birth year]

## 2021-06-25 NOTE — Therapy (Signed)
[facility] [address] [place], [place], [address] Phone: [phone]   Fax:  [phone]  Physical Therapy Treatment  Patient Details  Name: Brian Espinoza MRN: [id number] Date of Birth: Mar 11, 1956 Referring Provider (PT): Alison Murray, MD   Encounter Date: 06/25/2021   PT End of Session - 06/25/21 1253     Visit Number 27    Number of Visits 52    Date for PT Re-Evaluation 08/16/21    Authorization Type Medicare A&B; BCBS - 10th visit PN    Progress Note Due on Visit 30    PT Start Time 1147    PT Stop Time 1234    PT Time Calculation (min) 47 min    Activity Tolerance Patient tolerated treatment well    Behavior During Therapy WFL for tasks assessed/performed             Past Medical History:  Diagnosis Date   Acute inflammatory demyelinating polyneuropathy (HCC)    Acute on chronic respiratory failure with hypoxia (HCC)    Atrial fibrillation with RVR (HCC)    Dysautonomia (HCC)    Healthcare associated bacterial pneumonia     No past surgical history on file.  Vitals:   06/25/21 1221 06/25/21 1225  BP: (!) 156/75 109/60  Pulse: 85 60     Subjective Assessment - 06/25/21 1156     Subjective Has been doing the stretches; hips are getting better.  Has bandaid over trach to try and keep the air from escaping.    Pertinent History Chest pain, dissection of descending thoracic aorta, aneurysm of artery, a-fib, HTN, systolic CHF, oral phase dysphagia, sacral wound, PNA    Patient Stated Goals Improve hand function and walk to be more independent with daily activities.  Gain strength in legs to be able to lift hips and reduce shear on sacrum.    Currently in Pain? No/denies                [facility] Adult PT Treatment/Exercise - 06/25/21 1221       Transfers   Transfers Sit to Stand;Stand to Sit    Sit to Stand 3: Mod assist;2: Max assist    Sit to Stand Details (indicate cue type and reason) in  // bars from tilt in space w/c with UE holding // bars with dysomo to prevent slipping and PT performing hand over hand support as well as blocking knees and maintaining foot placement on floor.  Began with multiple leans forwards to shift COG and WB through UE and bilat feet.  Pt came to stand x 2 reps without PT assistance to lift up into standing.  Once in standing, PT assisted with maintaining hand placement and blocking knees.  During second sit > stand pt's genu recurvatum pushed back against w/c and caused w/c to tip, required second person to right chair prior to sitting down    Stand to Sit 3: Mod assist;2: Max assist    Stand to Sit Details PT maintained hand placement and foot placement; provided cues for anterior lean and to control descent into chair.    Lateral/Scoot Transfers 3: Mod assist    Lateral/Scoot Transfer Details (indicate cue type and reason) Performed lateral leans in w/c to scoot hips forward in chair to prepare for sit > stand.  Therapist assisted with maintaining foot placement and unweighting pelvis.  Performed 2-3 each side      Knee/Hip Exercises: Aerobic   Other Aerobic SciFit x 6  minutes at level 2.5 with hand splints to maintain UE grip on handles.  Belt around LE to keep hips in neutral rotation.  Pt able to keep steps between 95-100 per minute.  Reported pain in buttocks/sacrum after performing and required extended rest period tilted back for pressure relief and to allow systolic BP to decrease before continuing.               PT Education - 06/25/21 1252     Education Details Continued to review with pt evidence of progress and how focus is not on "pass/fail" but making steps towards overall goals    Person(s) Educated Patient    Methods Explanation    Comprehension Verbalized understanding              PT Short Term Goals - 05/18/21 1324       PT SHORT TERM GOAL #1   Title = LTG for 30 days 1/22, 60 days 2/21, 90 days 3/23                PT Long Term Goals - 06/15/21 1731       PT LONG TERM GOAL #1   Title Pt and wife will demonstrate independence with progressed HEP (LTG set at 30 days - reset to 60 days 2/21 and 90 days 3/23)    Baseline 06/15/21 PT continues to update. Pt has been performing current one.    Time 4    Period Weeks    Status On-going    Target Date 07/17/21      PT LONG TERM GOAL #2   Title Pt will demonstrate ability to perform supine <> sit on flat mat with min A    Baseline mod-max A. 06/15/21 min assist with sit to supine at feet, max assist with supine to sit    Time 4    Period Weeks    Status On-going    Target Date 07/17/21      PT LONG TERM GOAL #3   Title Pt will perform squat-scooting transfers w/c <> mat consistently to L and R on level surface with mod A    Baseline mod-max A. 06/15/21 mod/max assist but performing consistently with caregiver at home now instead of [place]    Time 4    Period Weeks    Status On-going    Target Date 07/17/21      PT LONG TERM GOAL #4   Title Pt will be able to perform sit to/from stand mod assist of 1 person.    Baseline 06/15/21 pt is total assist for sit to stand as needs 2 people but only min assist from each person. Ability does vary if knee buckle. Was able to stand for 5 min with min/mod assist of 2. Min assist of +2 (so total assist) to sit.    Time 4    Period Weeks    Status Revised      PT LONG TERM GOAL #5   Title Pt will be able to maintain standing >5 min mod assist of 1 person with appropriate braces on legs for improved balance and functional strength.    Baseline 06/15/21 currently 5 min total asist (as 2 people min/mod assist to help)    Time 4    Period Weeks    Status New    Target Date 07/17/21                   Plan - 06/25/21 1253  Clinical Impression Statement Pt did not report any pain in hip today.  Pt did report greater difficulty initiating LE extension on Sci Fit today and demonstrated increased BP  after performing; returned to baseline after 2-3 minute rest break, tilted in w/c.  Pt demonstrated significant progress with sit > stand today with pt performing full transition to standing with therapist maintaining hand position on // bars and feet position.  Will still benefit from +2 for safety and to maintain safe placement of w/c behind pt for stand > sit.    Comorbidities Chest pain, dissection of descending thoracic aorta, aneurysm of artery, a-fib, HTN, systolic CHF, oral phase dysphagia, sacral wound, PNA    Rehab Potential Good    PT Frequency 3x / week    PT Duration 12 weeks    PT Treatment/Interventions ADLs/Self Care Home Management;DME Instruction;Gait training;Stair training;Functional mobility training;Therapeutic activities;Therapeutic exercise;Balance training;Neuromuscular re-education;Wheelchair mobility training;Orthotic Fit/Training;Patient/family education;Energy conservation    PT Next Visit Plan SciFit for 6 minutes.  If +2 go into // bars and have him work on sit > stand - dysom on bars for grip, hand over hand to maintain hand position.  Sit <> squat training and increase use of RLE to pivot to L; does pretty good going to R.  Left anterior [place] AFO and right anterior ottobock that has medial post was most comfortable for standing.  Continue to work on grading movements with sit <> supine and rolling. Standing with +2 assist or try [place] DecSara again.  MONITOR BP: Keep SBP between 120-130!    Consulted and Agree with Plan of Care Patient             Patient will benefit from skilled therapeutic intervention in order to improve the following deficits and impairments:  Cardiopulmonary status limiting activity, Decreased activity tolerance, Decreased balance, Decreased coordination, Decreased endurance, Decreased mobility, Decreased strength, Difficulty walking, Impaired sensation, Impaired tone, Impaired UE functional use, Postural dysfunction, Pain, Abnormal gait  Visit  Diagnosis: Other abnormalities of gait and mobility  Transient paralysis  Muscle weakness (generalized)  Other disturbances of skin sensation  Unsteadiness on feet     Problem List Patient Active Problem List   Diagnosis Date Noted   Acute on chronic respiratory failure with hypoxia (HCC)    Acute inflammatory demyelinating polyneuropathy (HCC)    Dysautonomia (HCC)    Atrial fibrillation with RVR (HCC)    Healthcare associated bacterial pneumonia     Dierdre HighmanAudra F Toree Edling, PT, DPT 06/25/21    12:59 PM    [facility] [address] [place], [place], [address] Phone: [phone]   Fax:  [phone]  Name: Brian Espinoza MRN: [id number] Date of Birth: Sep 10, 1955

## 2021-06-27 ENCOUNTER — Ambulatory Visit: Payer: Medicare Other | Attending: Cardiovascular Disease

## 2021-06-27 ENCOUNTER — Ambulatory Visit: Payer: Medicare Other | Admitting: Occupational Therapy

## 2021-06-27 ENCOUNTER — Encounter: Payer: Medicare Other | Admitting: Occupational Therapy

## 2021-06-27 ENCOUNTER — Other Ambulatory Visit: Payer: Self-pay

## 2021-06-27 ENCOUNTER — Encounter: Payer: Self-pay | Admitting: Occupational Therapy

## 2021-06-27 DIAGNOSIS — M25641 Stiffness of right hand, not elsewhere classified: Secondary | ICD-10-CM | POA: Diagnosis present

## 2021-06-27 DIAGNOSIS — R2689 Other abnormalities of gait and mobility: Secondary | ICD-10-CM | POA: Diagnosis present

## 2021-06-27 DIAGNOSIS — M25642 Stiffness of left hand, not elsewhere classified: Secondary | ICD-10-CM

## 2021-06-27 DIAGNOSIS — R208 Other disturbances of skin sensation: Secondary | ICD-10-CM | POA: Insufficient documentation

## 2021-06-27 DIAGNOSIS — R295 Transient paralysis: Secondary | ICD-10-CM | POA: Diagnosis present

## 2021-06-27 DIAGNOSIS — R2681 Unsteadiness on feet: Secondary | ICD-10-CM | POA: Insufficient documentation

## 2021-06-27 DIAGNOSIS — M6281 Muscle weakness (generalized): Secondary | ICD-10-CM | POA: Insufficient documentation

## 2021-06-27 DIAGNOSIS — R278 Other lack of coordination: Secondary | ICD-10-CM | POA: Insufficient documentation

## 2021-06-27 DIAGNOSIS — R293 Abnormal posture: Secondary | ICD-10-CM | POA: Insufficient documentation

## 2021-06-27 NOTE — Therapy (Signed)
[facility] [address] [place], [place], [address] Phone: [phone]   Fax:  [phone]  Occupational Therapy Treatment  Patient Details  Name: Brian Espinoza MRN: [id number] Date of Birth: [birth year] Referring Provider (OT): Aundra Dubin (Will send certification to PCP [place])   Encounter Date: 06/27/2021   OT End of Session - 06/27/21 1316     Visit Number 27    Number of Visits 61    Date for OT Re-Evaluation 09/18/21    Authorization Type Medicare and Federal BCBS    Progress Note Due on Visit 30    OT Start Time 1100    OT Stop Time 1145    OT Time Calculation (min) 45 min    Equipment Utilized During Treatment universal cuff, modified stylus, zipper pull    Activity Tolerance Patient tolerated treatment well    Behavior During Therapy WFL for tasks assessed/performed             Past Medical History:  Diagnosis Date   Acute inflammatory demyelinating polyneuropathy ([place])    Acute on chronic respiratory failure with hypoxia (HCC)    Atrial fibrillation with RVR ([place])    Dysautonomia ([place])    Healthcare associated bacterial pneumonia     History reviewed. No pertinent surgical history.  There were no vitals filed for this visit.   Subjective Assessment - 06/27/21 1256     Subjective  I know the bike and the weight machine are helping me - I can do more to get out of bed now.  Patient now doing active transfers consistently at home.    Patient is accompanied by: Family member    Currently in Pain? No/denies    Pain Score 0-No pain                          OT Treatments/Exercises (OP) - 06/27/21 0001       ADLs   Eating Used universal cuff with spoon on right hand to stabilize wrist.  Patient able to effectively scoop from bowl, and pour onto plate - did not work specifically on hand to mouth pattern.  Patient needs additional practice on pronation /  supination to avoid excessive abduction and internal rotation.  Patient sent home with right universal cuff and spoon to practice self feeding.    UB Dressing Worked on doffing zip front jacket.  Patient issued zipper pull (ring) When patient placed in front of mirror so he could see his hand  - he could hook into ring and pull up/down, using other (left ) hand to stabilize and give slight force in opposite direction.     Writing Worked on use of stylus, and mouse and various compensations for typing/computer work.  Patient managed finances onhis computer.  Patient unable to get wheelchair under desk at this time.  Need to problem solve computer set up.                    OT Education - 06/27/21 1316     Education Details zipper pull, universal cuff for feeding    Person(s) Educated Patient    Methods Explanation;Demonstration;Tactile cues;Verbal cues    Comprehension Verbalized understanding;Returned demonstration;Need further instruction;Tactile cues required              OT Short Term Goals - 06/27/21 1320       OT SHORT TERM GOAL #1  Title Patient and caregiver will complete a home exercise program designed to improve strength in proximal UE's - shoulders and elbows    Time 4    Period Weeks    Status Achieved   issued AROM exercises and gentle theraband tricep strengthening 04/30/21   Target Date 04/21/21      OT SHORT TERM GOAL #2   Title Following set up patient will hold a cup with lid and long straw in midline to allow him to take a sip on his own    Time 4    Period Weeks    Status Partially Met   completed with simulation in clinic on 04/30/21 - continue to assess for consistency     OT SHORT TERM GOAL #3   Title While seated in upright supported position, patient will reach forward to make contact with upright target on table top (chest height) x 5 each arm    Time 4    Period Weeks    Status Achieved      OT SHORT TERM GOAL #4   Title Patient will be  able to reach toward face with either right or left hand in preparation to scratch chin/cheek    Time 4    Period Weeks    Status Achieved      OT SHORT TERM GOAL #5   Title Patient will utilize cylindrical grip to hold small water bottle in one hand.    Time 4    Period Weeks    Status Partially Met   demonstrated holding bottle in RUE however not consistently 04/30/21              OT Long Term Goals - 06/27/21 1321       OT LONG TERM GOAL #1   Title Patient will complete an updated stretching program to prevent contracture in digits    Time 12    Period Weeks    Status Achieved      OT LONG TERM GOAL #2   Title Patient and caregiver will demonstrate awareness of splint wear and care for BUE    Time 12    Period Weeks    Status Achieved   continue to modifiy and address PRN     OT LONG TERM GOAL #3   Title Patient will wash his face and upper body and upper legs with min assist    Time 12    Period Weeks    Status On-going   continuing to progress towards this goal     OT LONG TERM GOAL #4   Title Patient will actively bridge in bed to assist with pericare as needed    Time 12    Period Weeks    Status Partially Met   continuing to progress towards goal     OT LONG TERM GOAL #5   Title Patient will transfer to adapted commode with max assist in prep for toileting    Time 12    Period Weeks    Status On-going   working on troubleshooting [facility] and transfers for continuing to progess towards this goal     OT LONG TERM GOAL #6   Title Patient will transfer into/ out of shower with mod assist, and min assist for seated control during shower    Time 12    Period Weeks    Status On-going   much improvement with control and working towards goal     OT LONG TERM GOAL #7  Title Patient will feed himself 30% meal using utensils after set up    Time 12    Period Weeks    Status New    Target Date 09/18/21      OT LONG TERM GOAL #8   Title Patient will assist with  pulling pants over hips (up/down) to aide with toileting    Time 12    Period Weeks    Status New    Target Date 09/18/21                   Plan - 06/27/21 1317     Clinical Impression Statement Pt is showing improved postural control and stability, improved arm strength, improved strength in legs, improved transfers both in the clinic and at home.  Patient is using compensatory strategies now to help him participate more in his daily care.    OT Occupational Profile and History Comprehensive Assessment- Review of records and extensive additional review of physical, cognitive, psychosocial history related to current functional performance    Occupational performance deficits (Please refer to evaluation for details): ADL's;IADL's;Rest and Sleep;Leisure    Body Structure / Function / Physical Skills ADL;Decreased knowledge of use of DME;Strength;Balance;Dexterity;GMC;Pain;Tone;Body mechanics;Proprioception;UE functional use;Cardiopulmonary status limiting activity;Endurance;IADL;ROM;Fascial restriction;Improper spinal/pelvic alignment;Coordination;Flexibility;Mobility;Sensation;Wound;FMC;Muscle spasms;Skin integrity    Rehab Potential Good    Clinical Decision Making Multiple treatment options, significant modification of task necessary    Comorbidities Affecting Occupational Performance: Presence of comorbidities impacting occupational performance    Comorbidities impacting occupational performance description: AIDP, Chronic back pain, Descending thoracic aortic anneurysm    Modification or Assistance to Complete Evaluation  Min-Moderate modification of tasks or assist with assess necessary to complete eval    OT Frequency 3x / week    OT Duration 12 weeks    OT Treatment/Interventions Self-care/ADL training;Moist Heat;Fluidtherapy;DME and/or AE instruction;Splinting;Balance training;Aquatic Therapy;Contrast Bath;Therapeutic activities;Ultrasound;Therapeutic exercise;Passive range of  motion;Functional Mobility Training;Neuromuscular education;Electrical Stimulation;Manual Therapy;Patient/family education;Energy conservation    Plan visit 28 - start looking at remaining goals - transfers, BUE strengtening with UE active supports, ADL independence - self feeding/hip bridge for LB Dressing, etc    Consulted and Agree with Plan of Care Patient             Patient will benefit from skilled therapeutic intervention in order to improve the following deficits and impairments:   Body Structure / Function / Physical Skills: ADL, Decreased knowledge of use of DME, Strength, Balance, Dexterity, GMC, Pain, Tone, Body mechanics, Proprioception, UE functional use, Cardiopulmonary status limiting activity, Endurance, IADL, ROM, Fascial restriction, Improper spinal/pelvic alignment, Coordination, Flexibility, Mobility, Sensation, Wound, FMC, Muscle spasms, Skin integrity       Visit Diagnosis: Muscle weakness (generalized)  Other disturbances of skin sensation  Unsteadiness on feet  Stiffness of right hand, not elsewhere classified  Stiffness of left hand, not elsewhere classified  Other lack of coordination  Abnormal posture    Problem List Patient Active Problem List   Diagnosis Date Noted   Acute on chronic respiratory failure with hypoxia (HCC)    Acute inflammatory demyelinating polyneuropathy ([place])    Dysautonomia ([place])    Atrial fibrillation with RVR ([place])    Healthcare associated bacterial pneumonia     Mariah Milling, OT 06/27/2021, 1:28 PM  [facility] [address] [place], [place], [address] Phone: [phone]   Fax:  [phone]  Name: Brian Espinoza MRN: [id number] Date of Birth: 1956/04/05

## 2021-06-27 NOTE — Therapy (Signed)
[facility] [address] [place], [place], [address] Phone: [phone]   Fax:  [phone]  Physical Therapy Treatment  Patient Details  Name: Brian Espinoza MRN: [id number] Date of Birth: [birth year] Referring Provider (PT): Alison Murray, MD   Encounter Date: 06/27/2021   PT End of Session - 06/27/21 1153     Visit Number 28    Number of Visits 52    Date for PT Re-Evaluation 08/16/21    Authorization Type Medicare A&B; BCBS - 10th visit PN    Progress Note Due on Visit 30    PT Start Time 1145    PT Stop Time 1230    PT Time Calculation (min) 45 min    Equipment Utilized During Treatment Gait belt    Activity Tolerance Patient tolerated treatment well    Behavior During Therapy WFL for tasks assessed/performed             Past Medical History:  Diagnosis Date   Acute inflammatory demyelinating polyneuropathy (HCC)    Acute on chronic respiratory failure with hypoxia (HCC)    Atrial fibrillation with RVR (HCC)    Dysautonomia (HCC)    Healthcare associated bacterial pneumonia     No past surgical history on file.  There were no vitals filed for this visit.   Subjective Assessment - 06/27/21 1153     Subjective Pt is not reporting any hip pain today. New chair is working out okay.vc    Pertinent History p    Patient Stated Goals Improve hand function and walk to be more independent with daily activities.  Gain strength in legs to be able to lift hips and reduce shear on sacrum.             Sit to stand in parallel bar max A due to use of bil parallel bars: With PT sitting in front of patient and blocking bil knees. For first trial used gloves to hold the hand on parallel bars but pt reported pain in bil wrists so gloves were deferred for remaining trials. Tactile cues to lean forward to load WB through legs and moving arms forward. PT able to hold one hand on the bar as patient tends to pull both arms  off the parallel bars when he stands. PT also provided block at knees. Tried lateral weight shifts in standing. Stood for 30 seconds during standing. For last 3 trials, pt putting hands on PT's shoulders in front and required mod A with sit to stand but max A with controlled sitting. 5 x 30 sec Seated HS curls in wheelchair: total A for pushing forward> pt cued to push ball of foot down and pull chair forward, cues to keep left hip from externally rotating: 80 feet                               PT Short Term Goals - 05/18/21 1324       PT SHORT TERM GOAL #1   Title = LTG for 30 days 1/22, 60 days 2/21, 90 days 3/23               PT Long Term Goals - 06/15/21 1731       PT LONG TERM GOAL #1   Title Pt and wife will demonstrate independence with progressed HEP (LTG set at 30 days - reset to 60 days 2/21 and 90 days 3/23)  Baseline 06/15/21 PT continues to update. Pt has been performing current one.    Time 4    Period Weeks    Status On-going    Target Date 07/17/21      PT LONG TERM GOAL #2   Title Pt will demonstrate ability to perform supine <> sit on flat mat with min A    Baseline mod-max A. 06/15/21 min assist with sit to supine at feet, max assist with supine to sit    Time 4    Period Weeks    Status On-going    Target Date 07/17/21      PT LONG TERM GOAL #3   Title Pt will perform squat-scooting transfers w/c <> mat consistently to L and R on level surface with mod A    Baseline mod-max A. 06/15/21 mod/max assist but performing consistently with caregiver at home now instead of [facility]    Time 4    Period Weeks    Status On-going    Target Date 07/17/21      PT LONG TERM GOAL #4   Title Pt will be able to perform sit to/from stand mod assist of 1 person.    Baseline 06/15/21 pt is total assist for sit to stand as needs 2 people but only min assist from each person. Ability does vary if knee buckle. Was able to stand for 5 min with min/mod  assist of 2. Min assist of +2 (so total assist) to sit.    Time 4    Period Weeks    Status Revised      PT LONG TERM GOAL #5   Title Pt will be able to maintain standing >5 min mod assist of 1 person with appropriate braces on legs for improved balance and functional strength.    Baseline 06/15/21 currently 5 min total asist (as 2 people min/mod assist to help)    Time 4    Period Weeks    Status New    Target Date 07/17/21                   Plan - 06/27/21 1237     Clinical Impression Statement Pt required lesser assistance with proper anterior lean. Pt's left knee kept abducting during sit to stand and needed bil knees blocked. Pt able required foam beam between both legs to keep adducting feet during sit to stand. Initially patient kept pulling arms off the parallel bars. For last 3 trials, we trialed pt putting his hands on PT's houlders in front and was able to stand up with moderate Assist with standing up but required max A to control descent when sitting down.    Comorbidities Chest pain, dissection of descending thoracic aorta, aneurysm of artery, a-fib, HTN, systolic CHF, oral phase dysphagia, sacral wound, PNA    Rehab Potential Good    PT Frequency 3x / week    PT Duration 12 weeks    PT Treatment/Interventions ADLs/Self Care Home Management;DME Instruction;Gait training;Stair training;Functional mobility training;Therapeutic activities;Therapeutic exercise;Balance training;Neuromuscular re-education;Wheelchair mobility training;Orthotic Fit/Training;Patient/family education;Energy conservation    PT Next Visit Plan SciFit for 6 minutes.  If +2 go into // bars and have him work on sit > stand - dysom on bars for grip, hand over hand to maintain hand position.  Sit <> squat training and increase use of RLE to pivot to L; does pretty good going to R.  Left anterior Thuasne AFO and right anterior ottobock that has medial post was  most comfortable for standing.  Continue to  work on grading movements with sit <> supine and rolling. Standing with +2 assist or try Huntley DecSara again.  MONITOR BP: Keep SBP between 120-130!    Consulted and Agree with Plan of Care Patient             Patient will benefit from skilled therapeutic intervention in order to improve the following deficits and impairments:  Cardiopulmonary status limiting activity, Decreased activity tolerance, Decreased balance, Decreased coordination, Decreased endurance, Decreased mobility, Decreased strength, Difficulty walking, Impaired sensation, Impaired tone, Impaired UE functional use, Postural dysfunction, Pain, Abnormal gait  Visit Diagnosis: Muscle weakness (generalized)  Other disturbances of skin sensation  Other abnormalities of gait and mobility  Unsteadiness on feet     Problem List Patient Active Problem List   Diagnosis Date Noted   Acute on chronic respiratory failure with hypoxia (HCC)    Acute inflammatory demyelinating polyneuropathy (HCC)    Dysautonomia (HCC)    Atrial fibrillation with RVR (HCC)    Healthcare associated bacterial pneumonia     Ileana LaddKarmesh N Haven Pylant, PT 06/27/2021, 12:41 PM  [facility] [address] [place], [place], [address] Phone: [phone]   Fax:  [phone]  Name: Raymondo BandJames Laflamme MRN: [id number] Date of Birth: 12-14-1955

## 2021-06-29 ENCOUNTER — Ambulatory Visit: Payer: Medicare Other

## 2021-06-29 ENCOUNTER — Other Ambulatory Visit: Payer: Self-pay

## 2021-06-29 VITALS — BP 108/64

## 2021-06-29 DIAGNOSIS — M6281 Muscle weakness (generalized): Secondary | ICD-10-CM

## 2021-06-29 DIAGNOSIS — R2689 Other abnormalities of gait and mobility: Secondary | ICD-10-CM

## 2021-06-29 NOTE — Therapy (Signed)
[facility] [address] [place], [place], [address] Phone: [phone]   Fax:  [phone]  Physical Therapy Treatment  Patient Details  Name: Brian Espinoza MRN: [id number] Date of Birth: May 25, 1956 Referring Provider (PT): Leandro Reasoner, MD   Encounter Date: 06/29/2021   PT End of Session - 06/29/21 1105     Visit Number 29    Number of Visits 56    Date for PT Re-Evaluation 08/16/21    Authorization Type Medicare A&B; BCBS - 10th visit PN    Progress Note Due on Visit 57    PT Start Time 1103    PT Stop Time 1150    PT Time Calculation (min) 47 min    Equipment Utilized During Treatment Gait belt    Activity Tolerance Patient tolerated treatment well    Behavior During Therapy WFL for tasks assessed/performed             Past Medical History:  Diagnosis Date   Acute inflammatory demyelinating polyneuropathy ([place])    Acute on chronic respiratory failure with hypoxia (HCC)    Atrial fibrillation with RVR ([place])    Dysautonomia ([place])    Healthcare associated bacterial pneumonia     History reviewed. No pertinent surgical history.  Vitals:   06/29/21 1109  BP: 108/64     Subjective Assessment - 06/29/21 1105     Subjective Pt denies any new concerns.    Pertinent History Chest pain, dissection of descending thoracic aorta, aneurysm of artery, a-fib, HTN, systolic CHF, oral phase dysphagia, sacral wound, PNA    Patient Stated Goals Improve hand function and walk to be more independent with daily activities.  Gain strength in legs to be able to lift hips and reduce shear on sacrum.    Currently in Pain? No/denies                               [facility] Adult PT Treatment/Exercise - 06/29/21 1105       Transfers   Transfers Squat Pivot Transfers    Squat Pivot Transfers 3: Mod assist    Squat Pivot Transfer Details (indicate cue type and reason) w/c to/from mat with verbal cues to lean  forward. Assisted pt to reposition his feet each scoot. 2 scoots to get fully on to left and 3 scoots to right.    Comments PT assessed weight for pt with weighing him in w/c then just w/c. Weight was 197#      Exercises   Exercises Other Exercises      Knee/Hip Exercises: Aerobic   Other Aerobic Pt performed with BLE and BUE with active hands utilized to keep hands in place. PT stabilized w/c to prevent tipping. Verbal cues to push with one leg at a time to get started to help dissociate movements. 6 min level 2.5 then rest break then 5 min level 2.5. Pt reported 7-8/10 RPE after. BP after 6 min 130/80 and then after 2nd 5 min 142/80. 120/70 after resting 2 min                       PT Short Term Goals - 05/18/21 1324       PT SHORT TERM GOAL #1   Title = LTG for 30 days 1/22, 60 days 2/21, 90 days 3/23               PT Long  Term Goals - 06/15/21 1731       PT LONG TERM GOAL #1   Title Pt and wife will demonstrate independence with progressed HEP (LTG set at 30 days - reset to 60 days 2/21 and 90 days 3/23)    Baseline 06/15/21 PT continues to update. Pt has been performing current one.    Time 4    Period Weeks    Status On-going    Target Date 07/17/21      PT LONG TERM GOAL #2   Title Pt will demonstrate ability to perform supine <> sit on flat mat with min A    Baseline mod-max A. 06/15/21 min assist with sit to supine at feet, max assist with supine to sit    Time 4    Period Weeks    Status On-going    Target Date 07/17/21      PT LONG TERM GOAL #3   Title Pt will perform squat-scooting transfers w/c <> mat consistently to L and R on level surface with mod A    Baseline mod-max A. 06/15/21 mod/max assist but performing consistently with caregiver at home now instead of [facility]    Time 4    Period Weeks    Status On-going    Target Date 07/17/21      PT LONG TERM GOAL #4   Title Pt will be able to perform sit to/from stand mod assist of 1 person.     Baseline 06/15/21 pt is total assist for sit to stand as needs 2 people but only min assist from each person. Ability does vary if knee buckle. Was able to stand for 5 min with min/mod assist of 2. Min assist of +2 (so total assist) to sit.    Time 4    Period Weeks    Status Revised      PT LONG TERM GOAL #5   Title Pt will be able to maintain standing >5 min mod assist of 1 person with appropriate braces on legs for improved balance and functional strength.    Baseline 06/15/21 currently 5 min total asist (as 2 people min/mod assist to help)    Time 4    Period Weeks    Status New    Target Date 07/17/21                   Plan - 06/29/21 1248     Clinical Impression Statement Pt was able to increase time on SciFit today. Performed 2 intervals of 6 min then 5 min. BP slightly elevated at end but returned to normal quickly. Pt needed verbal cues to help dissociate movements when initially starting.    Comorbidities Chest pain, dissection of descending thoracic aorta, aneurysm of artery, a-fib, HTN, systolic CHF, oral phase dysphagia, sacral wound, PNA    Rehab Potential Good    PT Frequency 3x / week    PT Duration 12 weeks    PT Treatment/Interventions ADLs/Self Care Home Management;DME Instruction;Gait training;Stair training;Functional mobility training;Therapeutic activities;Therapeutic exercise;Balance training;Neuromuscular re-education;Wheelchair mobility training;Orthotic Fit/Training;Patient/family education;Energy conservation    PT Next Visit Plan SciFit for 6 minutes with intervals as tolerated.  If +2 go into // bars and have him work on sit > stand - dysom on bars for grip, hand over hand to maintain hand position.  Sit <> squat training and increase use of RLE to pivot to L; does pretty good going to R.  Left anterior Thuasne AFO and right anterior ottobock  that has medial post was most comfortable for standing.  Continue to work on grading movements with sit <> supine  and rolling. Standing with +2 assist or try Clarise Cruz again.  MONITOR BP: Keep SBP between 120-130!    Consulted and Agree with Plan of Care Patient             Patient will benefit from skilled therapeutic intervention in order to improve the following deficits and impairments:  Cardiopulmonary status limiting activity, Decreased activity tolerance, Decreased balance, Decreased coordination, Decreased endurance, Decreased mobility, Decreased strength, Difficulty walking, Impaired sensation, Impaired tone, Impaired UE functional use, Postural dysfunction, Pain, Abnormal gait  Visit Diagnosis: Other abnormalities of gait and mobility  Muscle weakness (generalized)     Problem List Patient Active Problem List   Diagnosis Date Noted   Acute on chronic respiratory failure with hypoxia (HCC)    Acute inflammatory demyelinating polyneuropathy (HCC)    Dysautonomia (HCC)    Atrial fibrillation with RVR ([place])    Healthcare associated bacterial pneumonia     Electa Sniff, PT, DPT, NCS 06/29/2021, 12:50 PM  [facility] [address] [place], [place], [address] Phone: [phone]   Fax:  [phone]  Name: Brian Espinoza MRN: [id number] Date of Birth: Apr 16, 1956

## 2021-07-02 ENCOUNTER — Ambulatory Visit: Payer: Medicare Other | Admitting: Physical Therapy

## 2021-07-02 ENCOUNTER — Ambulatory Visit: Payer: Medicare Other | Admitting: Occupational Therapy

## 2021-07-02 ENCOUNTER — Other Ambulatory Visit: Payer: Self-pay

## 2021-07-02 DIAGNOSIS — R2681 Unsteadiness on feet: Secondary | ICD-10-CM

## 2021-07-02 DIAGNOSIS — M6281 Muscle weakness (generalized): Secondary | ICD-10-CM | POA: Diagnosis not present

## 2021-07-02 DIAGNOSIS — M25642 Stiffness of left hand, not elsewhere classified: Secondary | ICD-10-CM

## 2021-07-02 DIAGNOSIS — R295 Transient paralysis: Secondary | ICD-10-CM

## 2021-07-02 DIAGNOSIS — R208 Other disturbances of skin sensation: Secondary | ICD-10-CM

## 2021-07-02 DIAGNOSIS — R2689 Other abnormalities of gait and mobility: Secondary | ICD-10-CM

## 2021-07-02 DIAGNOSIS — M25641 Stiffness of right hand, not elsewhere classified: Secondary | ICD-10-CM

## 2021-07-02 DIAGNOSIS — R278 Other lack of coordination: Secondary | ICD-10-CM

## 2021-07-02 NOTE — Therapy (Signed)
[facility] [address] [place], [place], [address] Phone: [phone]   Fax:  [phone]  Physical Therapy Treatment and 10th Visit Progress Note  Patient Details  Name: Brian Espinoza MRN: [id number] Date of Birth: 1956/04/27 Referring Provider (PT): Alison Murray, MD   Encounter Date: 07/02/2021  Progress Note Reporting Period 06/06/2021 to 07/02/2021  See note below for Objective Data and Assessment of Progress/Goals.    PT End of Session - 07/02/21 2116     Visit Number 30    Number of Visits 52    Date for PT Re-Evaluation 08/16/21    Authorization Type Medicare A&B; BCBS - 10th visit PN    Progress Note Due on Visit 40    PT Start Time 1400    PT Stop Time 1450    PT Time Calculation (min) 50 min    Equipment Utilized During Treatment Gait belt    Activity Tolerance Patient limited by pain    Behavior During Therapy WFL for tasks assessed/performed             Past Medical History:  Diagnosis Date   Acute inflammatory demyelinating polyneuropathy (HCC)    Acute on chronic respiratory failure with hypoxia (HCC)    Atrial fibrillation with RVR (HCC)    Dysautonomia (HCC)    Healthcare associated bacterial pneumonia     No past surgical history on file.  There were no vitals filed for this visit.   Subjective Assessment - 07/02/21 1409     Subjective Still going to grandkids' basketball games on the weekend.  Pt has a personal goal by the end of March to be strong enough to get into a car and decrease use of transportation services.    Pertinent History Chest pain, dissection of descending thoracic aorta, aneurysm of artery, a-fib, HTN, systolic CHF, oral phase dysphagia, sacral wound, PNA    Patient Stated Goals Improve hand function and walk to be more independent with daily activities.  Gain strength in legs to be able to lift hips and reduce shear on sacrum.    Currently in Pain? No/denies               [facility] Adult PT Treatment/Exercise - 07/02/21 1412       Transfers   Transfers Sit to Stand;Stand to Sit    Sit to Stand 3: Mod assist    Sit to Stand Details (indicate cue type and reason) in // bars with UE over dysom on bars with one PT each providing hand over hand assistance to maintain placement and tech assisting with chair placement due to pt pushing back against chair with back of knees when standing.  First stand PT stood in front of patient and provided support while performing weight shifting and cues to keep COG forwards over BOS; attempted to advance R and then L foot forwards<>back.  Second stand PT assisted with advancing LLE and then attempted to weight shift forwards over L but pt unable to flex R knee and reported significant pain in R knee.  When returning to sitting PT felt a clunk in R knee.  Did not attempt to stand again.  Provided pt with ice pack over R knee; pt reported tenderness over patella and posterior knee and reported knee felt "warm".  No edema or redness noted after standing.  Will continue to monitor    Stand to Sit 3: Mod assist      Knee/Hip Exercises: Aerobic   Other  Aerobic Pt performed with BLE and BUE with active hands utilized to keep hands in place and belt around LE to maintain neutral hip alignment. PT stabilized w/c to prevent tipping. Verbal cues to push with one leg at a time to get started to help dissociate movements. 7 min level 2.5 resistance at 90-100 steps per minute. BP after 7 min 138/64.            After performing SciFit pt reporting pain and discomfort in hips.  Tilted pt backwards and performed R and L passive hip flexion with IR<>ER.  Also performed passive hip flexion, IR and adduction stretch to bilat hips.  Pt reported improvement in hip pain.     PT Short Term Goals - 05/18/21 1324       PT SHORT TERM GOAL #1   Title = LTG for 30 days 1/22, 60 days 2/21, 90 days 3/23               PT Long Term Goals -  06/15/21 1731       PT LONG TERM GOAL #1   Title Pt and wife will demonstrate independence with progressed HEP (LTG set at 30 days - reset to 60 days 2/21 and 90 days 3/23)    Baseline 06/15/21 PT continues to update. Pt has been performing current one.    Time 4    Period Weeks    Status On-going    Target Date 07/17/21      PT LONG TERM GOAL #2   Title Pt will demonstrate ability to perform supine <> sit on flat mat with min A    Baseline mod-max A. 06/15/21 min assist with sit to supine at feet, max assist with supine to sit    Time 4    Period Weeks    Status On-going    Target Date 07/17/21      PT LONG TERM GOAL #3   Title Pt will perform squat-scooting transfers w/c <> mat consistently to L and R on level surface with mod A    Baseline mod-max A. 06/15/21 mod/max assist but performing consistently with caregiver at home now instead of [facility]    Time 4    Period Weeks    Status On-going    Target Date 07/17/21      PT LONG TERM GOAL #4   Title Pt will be able to perform sit to/from stand mod assist of 1 person.    Baseline 06/15/21 pt is total assist for sit to stand as needs 2 people but only min assist from each person. Ability does vary if knee buckle. Was able to stand for 5 min with min/mod assist of 2. Min assist of +2 (so total assist) to sit.    Time 4    Period Weeks    Status Revised      PT LONG TERM GOAL #5   Title Pt will be able to maintain standing >5 min mod assist of 1 person with appropriate braces on legs for improved balance and functional strength.    Baseline 06/15/21 currently 5 min total asist (as 2 people min/mod assist to help)    Time 4    Period Weeks    Status New    Target Date 07/17/21                   Plan - 07/02/21 2117     Clinical Impression Statement Pt continues to increase aerobic endurance and time  on SciFit.  Continued to focus on sit > stand and maintaining standing with UE supported on // bars; when attempting to take  one step forward with LLE pt unable to shift weight forwards due to R knee locked in genu recurvatum. Pt reported significant pain when attempting to flex and return to sitting.  Will likely need to wear clinic AFO and possibly wear swedish knee cage when attempting to advance LE forwards to improve knee control.    Comorbidities Chest pain, dissection of descending thoracic aorta, aneurysm of artery, a-fib, HTN, systolic CHF, oral phase dysphagia, sacral wound, PNA    Rehab Potential Good    PT Frequency 3x / week    PT Duration 12 weeks    PT Treatment/Interventions ADLs/Self Care Home Management;DME Instruction;Gait training;Stair training;Functional mobility training;Therapeutic activities;Therapeutic exercise;Balance training;Neuromuscular re-education;Wheelchair mobility training;Orthotic Fit/Training;Patient/family education;Energy conservation    PT Next Visit Plan How is R knee?  SciFit for 7 minutes with intervals as tolerated.  Wife bring car and begin to practice/problem solve car transfers.  If +2 go into // bars and have him work on sit > stand - dysom on bars for grip, hand over hand to maintain hand position - may need to wear AFO and knee cage to prevent knee from locking into extension.  Left anterior Thuasne AFO and right anterior ottobock that has medial post was most comfortable for standing.  Continue to work on grading movements with sit <> supine and rolling.  MONITOR BP: Keep SBP between 120-130!    Consulted and Agree with Plan of Care Patient             Patient will benefit from skilled therapeutic intervention in order to improve the following deficits and impairments:  Cardiopulmonary status limiting activity, Decreased activity tolerance, Decreased balance, Decreased coordination, Decreased endurance, Decreased mobility, Decreased strength, Difficulty walking, Impaired sensation, Impaired tone, Impaired UE functional use, Postural dysfunction, Pain, Abnormal  gait  Visit Diagnosis: Transient paralysis  Other abnormalities of gait and mobility  Muscle weakness (generalized)  Other disturbances of skin sensation  Unsteadiness on feet     Problem List Patient Active Problem List   Diagnosis Date Noted   Acute on chronic respiratory failure with hypoxia (HCC)    Acute inflammatory demyelinating polyneuropathy (HCC)    Dysautonomia (HCC)    Atrial fibrillation with RVR (HCC)    Healthcare associated bacterial pneumonia     Dierdre Highman, PT, DPT 07/02/21    9:25 PM    [facility] [address] [place], [place], [address] Phone: [phone]   Fax:  [phone]  Name: Brian Espinoza MRN: [id number] Date of Birth: [birth year]

## 2021-07-02 NOTE — Therapy (Signed)
[facility] [address] [place], [place], [address] Phone: [phone]   Fax:  [phone]  Occupational Therapy Treatment  Patient Details  Name: Brian Espinoza MRN: [id number] Date of Birth: 03-10-1956 Referring Provider (OT): Aundra Dubin (Will send certification to PCP [place])   Encounter Date: 07/02/2021   OT End of Session - 07/02/21 1452     Visit Number 28    Number of Visits 33    Date for OT Re-Evaluation 09/18/21    Authorization Type Medicare and Federal BCBS    Progress Note Due on Visit 30    OT Start Time 1450    OT Stop Time 1530    OT Time Calculation (min) 40 min    Equipment Utilized During Treatment universal cuff, modified stylus, zipper pull    Activity Tolerance Patient tolerated treatment well    Behavior During Therapy WFL for tasks assessed/performed             Past Medical History:  Diagnosis Date   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  There were no vitals filed for this visit.   Subjective Assessment - 07/02/21 1451     Subjective  "i have a new goal to get in and out of the car by April 1st"    Currently in Pain? No/denies    Pain Score 0-No pain                          OT Treatments/Exercises (OP) - 07/02/21 0001       Neurological Re-education Exercises   Other Exercises 1 [employer] scap protraction/chest press x 15 reps BUE (individually) with active UE supports      Modalities   Modalities Paraffin   at time of splint modifictions     RUE Paraffin   Number Minutes Paraffin 10 Minutes    RUE Paraffin Location Hand    Type Other    Comments simultaneously with LUE      LUE Paraffin   Number  Minutes Paraffin 10 Minutes    LUE Paraffin Location Hand      Splinting   Splinting made modifications to BUE resting hand splints - replacing velcro and straps.                      OT Short Term Goals - 06/27/21 1320       OT SHORT TERM GOAL #1   Title Patient and caregiver will complete a home exercise program designed to improve strength in proximal UE's - shoulders and elbows    Time 4    Period Weeks    Status Achieved   issued AROM exercises and gentle theraband tricep strengthening 04/30/21   Target Date 04/21/21      OT SHORT TERM GOAL #2   Title Following set up patient will hold a cup with lid and long straw in midline to allow him to take a sip on his own    Time 4    Period Weeks    Status Partially Met   completed with simulation in clinic on 04/30/21 - continue to assess for consistency     OT SHORT  TERM GOAL #3   Title While seated in upright supported position, patient will reach forward to make contact with upright target on table top (chest height) x 5 each arm    Time 4    Period Weeks    Status Achieved      OT SHORT TERM GOAL #4   Title Patient will be able to reach toward face with either right or left hand in preparation to scratch chin/cheek    Time 4    Period Weeks    Status Achieved      OT SHORT TERM GOAL #5   Title Patient will utilize cylindrical grip to hold small water bottle in one hand.    Time 4    Period Weeks    Status Partially Met   demonstrated holding bottle in RUE however not consistently 04/30/21              OT Long Term Goals - 06/27/21 1321       OT LONG TERM GOAL #1   Title Patient will complete an updated stretching program to prevent contracture in digits    Time 12    Period Weeks    Status Achieved      OT LONG TERM GOAL #2   Title Patient and caregiver will demonstrate awareness of splint wear and care for BUE    Time 12    Period Weeks    Status Achieved   continue to modifiy and address PRN      OT LONG TERM GOAL #3   Title Patient will wash his face and upper body and upper legs with min assist    Time 12    Period Weeks    Status On-going   continuing to progress towards this goal     OT LONG TERM GOAL #4   Title Patient will actively bridge in bed to assist with pericare as needed    Time 12    Period Weeks    Status Partially Met   continuing to progress towards goal     OT LONG TERM GOAL #5   Title Patient will transfer to adapted commode with max assist in prep for toileting    Time 12    Period Weeks    Status On-going   working on troubleshooting [facility] and transfers for continuing to progess towards this goal     OT LONG TERM GOAL #6   Title Patient will transfer into/ out of shower with mod assist, and min assist for seated control during shower    Time 12    Period Weeks    Status On-going   much improvement with control and working towards goal     OT LONG TERM GOAL #7   Title Patient will feed himself 30% meal using utensils after set up    Time 12    Period Weeks    Status New    Target Date 09/18/21      OT LONG TERM GOAL #8   Title Patient will assist with pulling pants over hips (up/down) to aide with toileting    Time 12    Period Weeks    Status New    Target Date 09/18/21                   Plan - 07/02/21 1603     Clinical Impression Statement Pt motivated for continued improvements and progress. Pt wishes to increase BUE strengthening for increasing independence and  ability with car transfers.    OT Occupational Profile and History Comprehensive Assessment- Review of records and extensive additional review of physical, cognitive, psychosocial history related to current functional performance    Occupational performance deficits (Please refer to evaluation for details): ADL's;IADL's;Rest and Sleep;Leisure    Body Structure / Function / Physical Skills ADL;Decreased knowledge of use of  DME;Strength;Balance;Dexterity;GMC;Pain;Tone;Body mechanics;Proprioception;UE functional use;Cardiopulmonary status limiting activity;Endurance;IADL;ROM;Fascial restriction;Improper spinal/pelvic alignment;Coordination;Flexibility;Mobility;Sensation;Wound;FMC;Muscle spasms;Skin integrity    Rehab Potential Good    Clinical Decision Making Multiple treatment options, significant modification of task necessary    Comorbidities Affecting Occupational Performance: Presence of comorbidities impacting occupational performance    Comorbidities impacting occupational performance description: AIDP, Chronic back pain, Descending thoracic aortic anneurysm    Modification or Assistance to Complete Evaluation  Min-Moderate modification of tasks or assist with assess necessary to complete eval    OT Frequency 3x / week    OT Duration 12 weeks    OT Treatment/Interventions Self-care/ADL training;Moist Heat;Fluidtherapy;DME and/or AE instruction;Splinting;Balance training;Aquatic Therapy;Contrast Bath;Therapeutic activities;Ultrasound;Therapeutic exercise;Passive range of motion;Functional Mobility Training;Neuromuscular education;Electrical Stimulation;Manual Therapy;Patient/family education;Energy conservation    Plan continue to progress with BUE strengthening w UE active supports, increasing independence with ADLs (self feeding and hip bridge for LB Dressing)    Consulted and Agree with Plan of Care Patient             Patient will benefit from skilled therapeutic intervention in order to improve the following deficits and impairments:   Body Structure / Function / Physical Skills: ADL, Decreased knowledge of use of DME, Strength, Balance, Dexterity, GMC, Pain, Tone, Body mechanics, Proprioception, UE functional use, Cardiopulmonary status limiting activity, Endurance, IADL, ROM, Fascial restriction, Improper spinal/pelvic alignment, Coordination, Flexibility, Mobility, Sensation, Wound, FMC, Muscle spasms,  Skin integrity       Visit Diagnosis: Other abnormalities of gait and mobility  Muscle weakness (generalized)  Other disturbances of skin sensation  Unsteadiness on feet  Stiffness of right hand, not elsewhere classified  Stiffness of left hand, not elsewhere classified  Other lack of coordination    Problem List Patient Active Problem List   Diagnosis Date Noted   Acute on chronic respiratory failure with hypoxia (HCC)    Acute inflammatory demyelinating polyneuropathy (HCC)    Dysautonomia ([place])    Atrial fibrillation with RVR ([place])    Healthcare associated bacterial pneumonia     Zachery Conch, OT 07/02/2021, 4:10 PM  [facility] [address] [place], [place], [address] Phone: [phone]   Fax:  [phone]  Name: Brian Espinoza MRN: [id number] Date of Birth: Apr 25, 1956

## 2021-07-04 ENCOUNTER — Ambulatory Visit: Payer: Medicare Other

## 2021-07-04 ENCOUNTER — Other Ambulatory Visit: Payer: Self-pay

## 2021-07-04 DIAGNOSIS — M6281 Muscle weakness (generalized): Secondary | ICD-10-CM

## 2021-07-04 DIAGNOSIS — R2689 Other abnormalities of gait and mobility: Secondary | ICD-10-CM

## 2021-07-04 NOTE — Therapy (Signed)
Brian Espinoza [address] [place], [place], [address] Phone: [phone]   Fax:  [phone]  Physical Therapy Treatment  Patient Details  Name: Brian Espinoza MRN: [id number] Date of Birth: 1955-09-30 Referring Provider (PT): Leandro Reasoner, MD   Encounter Date: 07/04/2021   PT End of Session - 07/04/21 1155     Visit Number 31    Number of Visits 72    Date for PT Re-Evaluation 08/16/21    Authorization Type Medicare A&B; BCBS - 10th visit PN    Progress Note Due on Visit 7    PT Start Time 1152    PT Stop Time 1230    PT Time Calculation (min) 38 min    Equipment Utilized During Treatment Gait belt    Activity Tolerance Patient tolerated treatment well    Behavior During Therapy WFL for tasks assessed/performed             Past Medical History:  Diagnosis Date   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 No pertinent surgical history.  There were no vitals filed for this visit.   Subjective Assessment - 07/04/21 1155     Subjective Pt reports that he wore his knee brace for right today but doing better. Saw wound center and sore on bottom is very small now.    Pertinent History Chest pain, dissection of descending thoracic aorta, aneurysm of artery, a-fib, HTN, systolic CHF, oral phase dysphagia, sacral wound, PNA    Patient Stated Goals Improve hand function and walk to be more independent with daily activities.  Gain strength in legs to be able to lift hips and reduce shear on sacrum.    Currently in Pain? No/denies                               [facility] Adult PT Treatment/Exercise - 07/04/21 1156       Transfers   Transfers Sit to Stand;Stand to Sit    Sit to Stand 3: Mod assist   +2    Sit to Stand Details Verbal cues for technique;Tactile cues for weight shifting    Sit to Stand Details (indicate cue type and reason) Performed from wheelchair facing elevated mat. Had pt scoot forward to front of chair first with PT on each side stabilizing feet on floor and cuing to lean to side and scoot one hip forward at a time. Then cued to lean forward to rise with PTs blocking each knee. Performed 4 reps throughout session.    Stand to Sit 3: Mod assist   +2   Stand to Sit Details (indicate cue type and reason) Verbal cues for technique;Tactile cues for initiation    Stand to Sit Details Pt was cued to try to keep slight bend in knees and lean forward when going to sit slow and controlled. PTs on each side blocking knees. Performed 4 reps throughout session.    Comments BP=130/70 after standing activities      Neuro Re-ed    Neuro Re-ed Details  Standing in front of elevated mat with 14" step on top for pt to rest forearms on for 4 bouts (added 2" foam pad on last 2 bouts) with PT on each side  blocking knees for 3/4 bouts and 3rd PT in front for 1 bout to watch upper trunk to allow full focus on legs. 1st bout: pt performed standing leaning through forearms as described above with PT cuing to try to let knees go "soft" to get slight bend. Also cued to stick out bottom some. Having trouble obtaining slight bend even with assistance to not lock knees out. 2nd bout moved chair slightly closer and had pt scoot forward a little more to try to gets hips over legs more. Had pt work on "soft" knees with PTs blocking each leg on side and assisting to not lock out knee. Performed weight shifting side to side then coming down in partial mini-squat x 5 and back up with maintaining slight bend in knees. 3rd bout repeated the above with larger focus on mini-squats maintaining with slight bend in knees with therapist's assisting mod assist. 4th bout performed weight shifting side to side with slight bend and  trying to lift each heel just slightly x 3 each side mod/max assist.                       PT Short Term Goals - 05/18/21 1324       PT SHORT TERM GOAL #1   Title = LTG for 30 days 1/22, 60 days 2/21, 90 days 3/23               PT Long Term Goals - 06/15/21 1731       PT LONG TERM GOAL #1   Title Pt and wife will demonstrate independence with progressed HEP (LTG set at 30 days - reset to 60 days 2/21 and 90 days 3/23)    Baseline 06/15/21 PT continues to update. Pt has been performing current one.    Time 4    Period Weeks    Status On-going    Target Date 07/17/21      PT LONG TERM GOAL #2   Title Pt will demonstrate ability to perform supine <> sit on flat mat with min A    Baseline mod-max A. 06/15/21 min assist with sit to supine at feet, max assist with supine to sit    Time 4    Period Weeks    Status On-going    Target Date 07/17/21      PT LONG TERM GOAL #3   Title Pt will perform squat-scooting transfers w/c <> mat consistently to L and R on level surface with mod A    Baseline mod-max A. 06/15/21 mod/max assist but performing consistently with caregiver at home now instead of [place]    Time 4    Period Weeks    Status On-going    Target Date 07/17/21      PT LONG TERM GOAL #4   Title Pt will be able to perform sit to/from stand mod assist of 1 person.    Baseline 06/15/21 pt is total assist for sit to stand as needs 2 people but only min assist from each person. Ability does vary if knee buckle. Was able to stand for 5 min with min/mod assist of 2. Min assist of +2 (so total assist) to sit.    Time 4    Period Weeks    Status Revised      PT LONG TERM GOAL #5   Title Pt will be able to maintain standing >5 min mod assist of 1 person with appropriate braces on legs for improved  balance and functional strength.    Baseline 06/15/21 currently 5 min total asist (as 2 people min/mod assist to help)    Time 4    Period Weeks    Status New    Target  Date 07/17/21                   Plan - 07/04/21 1335     Clinical Impression Statement Pt did well with being able to bear weight through forearms on elevated mat with 14" step on top to allow more focus on grading movements in BLE with trying to keep "soft" knees". When cued to bend knees slightly with some hip flexion pt was able to initiate with better control.    Comorbidities Chest pain, dissection of descending thoracic aorta, aneurysm of artery, a-fib, HTN, systolic CHF, oral phase dysphagia, sacral wound, PNA    Rehab Potential Good    PT Frequency 3x / week    PT Duration 12 weeks    PT Treatment/Interventions ADLs/Self Care Home Management;DME Instruction;Gait training;Stair training;Functional mobility training;Therapeutic activities;Therapeutic exercise;Balance training;Neuromuscular re-education;Wheelchair mobility training;Orthotic Fit/Training;Patient/family education;Energy conservation    PT Next Visit Plan [employer] for 7 minutes with intervals as tolerated.  Wife bring car and begin to practice/problem solve car transfers. Continue with use of elevated mat with 14" step and padding to allow WB through forearms with weight shifting trying to keep soft knees if have 2nd person.  If +2 can also go into // bars and have him work on sit > stand - dysom on bars for grip, hand over hand to maintain hand position - may need to wear AFO and knee cage to prevent knee from locking into extension.  Left anterior [place] AFO and right anterior ottobock that has medial post was most comfortable for standing.  Continue to work on grading movements with sit <> supine and rolling.  MONITOR BP: Keep SBP between 120-130!    Consulted and Agree with Plan of Care Patient             Patient will benefit from skilled therapeutic intervention in order to improve the following deficits and impairments:  Cardiopulmonary status limiting activity, Decreased activity tolerance, Decreased balance,  Decreased coordination, Decreased endurance, Decreased mobility, Decreased strength, Difficulty walking, Impaired sensation, Impaired tone, Impaired UE functional use, Postural dysfunction, Pain, Abnormal gait  Visit Diagnosis: Other abnormalities of gait and mobility  Muscle weakness (generalized)     Problem List Patient Active Problem List   Diagnosis Date Noted   Acute on chronic respiratory failure with hypoxia (HCC)    Acute inflammatory demyelinating polyneuropathy (HCC)    Dysautonomia (HCC)    Atrial fibrillation with RVR ([place])    Healthcare associated bacterial pneumonia     Electa Sniff, PT, DPT, NCS 07/04/2021, 1:38 PM  [facility] [address] [place], [place], [address] Phone: [phone]   Fax:  [phone]  Name: Brian Espinoza MRN: [id number] Date of Birth: 1955/12/12

## 2021-07-06 ENCOUNTER — Ambulatory Visit: Payer: Medicare Other | Admitting: Occupational Therapy

## 2021-07-06 ENCOUNTER — Other Ambulatory Visit: Payer: Self-pay

## 2021-07-06 ENCOUNTER — Encounter: Payer: Self-pay | Admitting: Occupational Therapy

## 2021-07-06 ENCOUNTER — Ambulatory Visit: Payer: Medicare Other

## 2021-07-06 VITALS — BP 122/70

## 2021-07-06 DIAGNOSIS — R2681 Unsteadiness on feet: Secondary | ICD-10-CM

## 2021-07-06 DIAGNOSIS — R2689 Other abnormalities of gait and mobility: Secondary | ICD-10-CM

## 2021-07-06 DIAGNOSIS — R278 Other lack of coordination: Secondary | ICD-10-CM

## 2021-07-06 DIAGNOSIS — M25641 Stiffness of right hand, not elsewhere classified: Secondary | ICD-10-CM

## 2021-07-06 DIAGNOSIS — M6281 Muscle weakness (generalized): Secondary | ICD-10-CM

## 2021-07-06 DIAGNOSIS — M25642 Stiffness of left hand, not elsewhere classified: Secondary | ICD-10-CM

## 2021-07-06 DIAGNOSIS — R208 Other disturbances of skin sensation: Secondary | ICD-10-CM

## 2021-07-06 NOTE — Therapy (Signed)
[facility] [address] [place], [place], [address] Phone: [phone]   Fax:  [phone]  Occupational Therapy Treatment  Patient Details  Name: Brian Espinoza MRN: [id number] Date of Birth: 10/10/1955 Referring Provider (OT): Aundra Dubin (Will send certification to PCP [place])   Encounter Date: 07/06/2021   OT End of Session - 07/06/21 1450     Visit Number 29    Number of Visits 37    Date for OT Re-Evaluation 09/18/21    Authorization Type Medicare and Federal BCBS    Progress Note Due on Visit 30    OT Start Time 1447    OT Stop Time 1530    OT Time Calculation (min) 43 min    Equipment Utilized During Treatment universal cuff, modified stylus, zipper pull    Activity Tolerance Patient tolerated treatment well    Behavior During Therapy WFL for tasks assessed/performed             Past Medical History:  Diagnosis Date   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 No pertinent surgical history.  There were no vitals filed for this visit.   Subjective Assessment - 07/06/21 1450     Subjective  "rode the bike for 8 minutes and practiced standing"    Currently in Pain? No/denies    Pain Score 0-No pain                          OT Treatments/Exercises (OP) - 07/06/21 1452       ADLs   Eating reporting increased ease and independence with self feeding and eating finger foods and drinking from a cup.      Neurological Re-education Exercises   Other Exercises 1 Yellow Theraband x 10 reps - shoulder flexion, tricep extension, scap retraction/row, bicep curls    Other Exercises 2 Universal Gym with active hands x 12 reps x 2 sets for scap protraction/chest press     Other Weight-Bearing Exercises 1 light/modified wheelchar pushups with max A For maintaining BLE positioning and LUE hand positioning on chair. Pt continued to hve difficulty with maintaining RUE on arm but demonstrated quad and tricep activation for exericses for min lift.                      OT Short Term Goals - 06/27/21 1320       OT SHORT TERM GOAL #1   Title Patient and caregiver will complete a home exercise program designed to improve strength in proximal UE's - shoulders and elbows    Time 4    Period Weeks    Status Achieved   issued AROM exercises and gentle theraband tricep strengthening 04/30/21   Target Date 04/21/21      OT SHORT TERM GOAL #2   Title Following set up patient will hold a cup with lid and long straw in midline to allow him to take a sip on his own    Time 4    Period Weeks    Status Partially Met   completed with simulation in clinic on 04/30/21 - continue to assess for consistency     OT SHORT TERM GOAL #3   Title While  seated in upright supported position, patient will reach forward to make contact with upright target on table top (chest height) x 5 each arm    Time 4    Period Weeks    Status Achieved      OT SHORT TERM GOAL #4   Title Patient will be able to reach toward face with either right or left hand in preparation to scratch chin/cheek    Time 4    Period Weeks    Status Achieved      OT SHORT TERM GOAL #5   Title Patient will utilize cylindrical grip to hold small water bottle in one hand.    Time 4    Period Weeks    Status Partially Met   demonstrated holding bottle in RUE however not consistently 04/30/21              OT Long Term Goals - 06/27/21 1321       OT LONG TERM GOAL #1   Title Patient will complete an updated stretching program to prevent contracture in digits    Time 12    Period Weeks    Status Achieved      OT LONG TERM GOAL #2   Title Patient and caregiver will demonstrate awareness of  splint wear and care for BUE    Time 12    Period Weeks    Status Achieved   continue to modifiy and address PRN     OT LONG TERM GOAL #3   Title Patient will wash his face and upper body and upper legs with min assist    Time 12    Period Weeks    Status On-going   continuing to progress towards this goal     OT LONG TERM GOAL #4   Title Patient will actively bridge in bed to assist with pericare as needed    Time 12    Period Weeks    Status Partially Met   continuing to progress towards goal     OT LONG TERM GOAL #5   Title Patient will transfer to adapted commode with max assist in prep for toileting    Time 12    Period Weeks    Status On-going   working on troubleshooting [facility] and transfers for continuing to progess towards this goal     OT LONG TERM GOAL #6   Title Patient will transfer into/ out of shower with mod assist, and min assist for seated control during shower    Time 12    Period Weeks    Status On-going   much improvement with control and working towards goal     OT LONG TERM GOAL #7   Title Patient will feed himself 30% meal using utensils after set up    Time 12    Period Weeks    Status New    Target Date 09/18/21      OT LONG TERM GOAL #8   Title Patient will assist with pulling pants over hips (up/down) to aide with toileting    Time 12    Period Weeks    Status New    Target Date 09/18/21                   Plan - 07/06/21 1535     Clinical Impression Statement Pt demonstrated increased strength with BUE and continues to be motivated for increased strength for increasing independence wiht transfers and ADLs.  OT Occupational Profile and History Comprehensive Assessment- Review of records and extensive additional review of physical, cognitive, psychosocial history related to current functional performance    Occupational performance deficits (Please refer to evaluation for details): ADL's;IADL's;Rest and Sleep;Leisure    Body  Structure / Function / Physical Skills ADL;Decreased knowledge of use of DME;Strength;Balance;Dexterity;GMC;Pain;Tone;Body mechanics;Proprioception;UE functional use;Cardiopulmonary status limiting activity;Endurance;IADL;ROM;Fascial restriction;Improper spinal/pelvic alignment;Coordination;Flexibility;Mobility;Sensation;Wound;FMC;Muscle spasms;Skin integrity    Rehab Potential Good    Clinical Decision Making Multiple treatment options, significant modification of task necessary    Comorbidities Affecting Occupational Performance: Presence of comorbidities impacting occupational performance    Comorbidities impacting occupational performance description: AIDP, Chronic back pain, Descending thoracic aortic anneurysm    Modification or Assistance to Complete Evaluation  Min-Moderate modification of tasks or assist with assess necessary to complete eval    OT Frequency 3x / week    OT Duration 12 weeks    OT Treatment/Interventions Self-care/ADL training;Moist Heat;Fluidtherapy;DME and/or AE instruction;Splinting;Balance training;Aquatic Therapy;Contrast Bath;Therapeutic activities;Ultrasound;Therapeutic exercise;Passive range of motion;Functional Mobility Training;Neuromuscular education;Electrical Stimulation;Manual Therapy;Patient/family education;Energy conservation    Plan continue to progress with BUE strengthening w UE active supports, increasing independence with ADLs (self feeding and hip bridge for LB Dressing)    Consulted and Agree with Plan of Care Patient             Patient will benefit from skilled therapeutic intervention in order to improve the following deficits and impairments:   Body Structure / Function / Physical Skills: ADL, Decreased knowledge of use of DME, Strength, Balance, Dexterity, GMC, Pain, Tone, Body mechanics, Proprioception, UE functional use, Cardiopulmonary status limiting activity, Endurance, IADL, ROM, Fascial restriction, Improper spinal/pelvic alignment,  Coordination, Flexibility, Mobility, Sensation, Wound, FMC, Muscle spasms, Skin integrity       Visit Diagnosis: Other abnormalities of gait and mobility  Other disturbances of skin sensation  Muscle weakness (generalized)  Unsteadiness on feet  Stiffness of right hand, not elsewhere classified  Stiffness of left hand, not elsewhere classified  Other lack of coordination    Problem List Patient Active Problem List   Diagnosis Date Noted   Acute on chronic respiratory failure with hypoxia (HCC)    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 [place], [place], [address] Phone: [phone]   Fax:  [phone]  Name: Demarkis Gheen MRN: [id number] Date of Birth: [birth year]

## 2021-07-06 NOTE — Therapy (Signed)
[facility] [address] [place], [place], [address] Phone: [phone]   Fax:  [phone]  Physical Therapy Treatment  Patient Details  Name: Brian Espinoza MRN: [id number] Date of Birth: September 09, 1955 Referring Provider (PT): Alison Murray, MD   Encounter Date: 07/06/2021   PT End of Session - 07/06/21 1404     Visit Number 32    Number of Visits 52    Date for PT Re-Evaluation 08/16/21    Authorization Type Medicare A&B; BCBS - 10th visit PN    Progress Note Due on Visit 40    PT Start Time 1402    PT Stop Time 1445    PT Time Calculation (min) 43 min    Equipment Utilized During Treatment Gait belt    Activity Tolerance Patient tolerated treatment well    Behavior During Therapy WFL for tasks assessed/performed             Past Medical History:  Diagnosis Date   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 No pertinent surgical history.  Vitals:   07/06/21 1404  BP: 122/70     Subjective Assessment - 07/06/21 1928     Subjective Pt reports that he is doing well.    Pertinent History Chest pain, dissection of descending thoracic aorta, aneurysm of artery, a-fib, HTN, systolic CHF, oral phase dysphagia, sacral wound, PNA    Patient Stated Goals Improve hand function and walk to be more independent with daily activities.  Gain strength in legs to be able to lift hips and reduce shear on sacrum.    Currently in Pain? No/denies                               [facility] Adult PT Treatment/Exercise - 07/06/21 1406       Transfers   Transfers Sit to Stand    Sit to Stand Details (indicate cue type and reason) x 3 with slight lift of bottom and holding with 1 person in front min/mod assist, x 3 with  2 people assist min/mod assist with larger range but short of full upright with knees bent. PTs blocking legs for safety.  BP=148/78 after with decrease to 130/78 after resting 2 min., then 1 full stand with 2 people assist working on control with rising and descent.      Exercises   Exercises Other Exercises      Knee/Hip Exercises: Aerobic   Other Aerobic Pt performed with BLE and BUE with active hands utilized to keep hands in place and belt around LE to maintain neutral hip alignment. PT stabilized w/c to prevent tipping. Verbal cues to push with one leg at a time to get started to help dissociate movements.SciFit x 8 min level 2.7. BP=130/70 after                       PT Short Term Goals - 05/18/21 1324       PT SHORT TERM GOAL #1   Title = LTG for 30 days 1/22, 60 days 2/21, 90 days 3/23               PT Long Term Goals - 06/15/21 1731  PT LONG TERM GOAL #1   Title Pt and wife will demonstrate independence with progressed HEP (LTG set at 30 days - reset to 60 days 2/21 and 90 days 3/23)    Baseline 06/15/21 PT continues to update. Pt has been performing current one.    Time 4    Period Weeks    Status On-going    Target Date 07/17/21      PT LONG TERM GOAL #2   Title Pt will demonstrate ability to perform supine <> sit on flat mat with min A    Baseline mod-max A. 06/15/21 min assist with sit to supine at feet, max assist with supine to sit    Time 4    Period Weeks    Status On-going    Target Date 07/17/21      PT LONG TERM GOAL #3   Title Pt will perform squat-scooting transfers w/c <> mat consistently to L and R on level surface with mod A    Baseline mod-max A. 06/15/21 mod/max assist but performing consistently with caregiver at home now instead of [place]    Time 4    Period Weeks    Status On-going    Target Date 07/17/21      PT LONG TERM GOAL #4   Title Pt will be able to perform sit to/from stand mod assist of 1 person.    Baseline  06/15/21 pt is total assist for sit to stand as needs 2 people but only min assist from each person. Ability does vary if knee buckle. Was able to stand for 5 min with min/mod assist of 2. Min assist of +2 (so total assist) to sit.    Time 4    Period Weeks    Status Revised      PT LONG TERM GOAL #5   Title Pt will be able to maintain standing >5 min mod assist of 1 person with appropriate braces on legs for improved balance and functional strength.    Baseline 06/15/21 currently 5 min total asist (as 2 people min/mod assist to help)    Time 4    Period Weeks    Status New    Target Date 07/17/21                   Plan - 07/06/21 1937     Clinical Impression Statement Pt did well with increase in time and resistance on SciFit. Continued to focus on trying to grade movements and keep bend in knees with rising to stand.    Comorbidities Chest pain, dissection of descending thoracic aorta, aneurysm of artery, a-fib, HTN, systolic CHF, oral phase dysphagia, sacral wound, PNA    Rehab Potential Good    PT Frequency 3x / week    PT Duration 12 weeks    PT Treatment/Interventions ADLs/Self Care Home Management;DME Instruction;Gait training;Stair training;Functional mobility training;Therapeutic activities;Therapeutic exercise;Balance training;Neuromuscular re-education;Wheelchair mobility training;Orthotic Fit/Training;Patient/family education;Energy conservation    PT Next Visit Plan SciFit for 8 minutes with intervals as tolerated.  Wife bring car and begin to practice/problem solve car transfers. Continue with use of elevated mat with 14" step and padding to allow WB through forearms with weight shifting trying to keep soft knees if have 2nd person.  If +2 can also go into // bars and have him work on sit > stand - dysom on bars for grip, hand over hand to maintain hand position - may need to wear AFO and knee cage to  prevent knee from locking into extension.  Left anterior [place] AFO and  right anterior ottobock that has medial post was most comfortable for standing.  Continue to work on grading movements with sit <> supine and rolling.  MONITOR BP: Keep SBP between 120-130!    Consulted and Agree with Plan of Care Patient             Patient will benefit from skilled therapeutic intervention in order to improve the following deficits and impairments:  Cardiopulmonary status limiting activity, Decreased activity tolerance, Decreased balance, Decreased coordination, Decreased endurance, Decreased mobility, Decreased strength, Difficulty walking, Impaired sensation, Impaired tone, Impaired UE functional use, Postural dysfunction, Pain, Abnormal gait  Visit Diagnosis: Other abnormalities of gait and mobility  Muscle weakness (generalized)     Problem List Patient Active Problem List   Diagnosis Date Noted   Acute on chronic respiratory failure with hypoxia (HCC)    Acute inflammatory demyelinating polyneuropathy (HCC)    Dysautonomia (HCC)    Atrial fibrillation with RVR (HCC)    Healthcare associated bacterial pneumonia     Ronn Melena, PT, DPT, NCS 07/06/2021, 7:41 PM  [facility] [address] [place], [place], [address] Phone: [phone]   Fax:  [phone]  Name: Sinclair Alligood MRN: [id number] Date of Birth: 28-Sep-1955

## 2021-07-09 ENCOUNTER — Ambulatory Visit: Payer: Medicare Other | Admitting: Occupational Therapy

## 2021-07-09 ENCOUNTER — Encounter: Payer: Self-pay | Admitting: Occupational Therapy

## 2021-07-09 ENCOUNTER — Other Ambulatory Visit: Payer: Self-pay

## 2021-07-09 ENCOUNTER — Ambulatory Visit: Payer: Medicare Other | Admitting: Physical Therapy

## 2021-07-09 DIAGNOSIS — R2681 Unsteadiness on feet: Secondary | ICD-10-CM

## 2021-07-09 DIAGNOSIS — M25642 Stiffness of left hand, not elsewhere classified: Secondary | ICD-10-CM

## 2021-07-09 DIAGNOSIS — M6281 Muscle weakness (generalized): Secondary | ICD-10-CM

## 2021-07-09 DIAGNOSIS — R295 Transient paralysis: Secondary | ICD-10-CM

## 2021-07-09 DIAGNOSIS — R2689 Other abnormalities of gait and mobility: Secondary | ICD-10-CM

## 2021-07-09 DIAGNOSIS — R208 Other disturbances of skin sensation: Secondary | ICD-10-CM

## 2021-07-09 DIAGNOSIS — M25641 Stiffness of right hand, not elsewhere classified: Secondary | ICD-10-CM

## 2021-07-09 DIAGNOSIS — R278 Other lack of coordination: Secondary | ICD-10-CM

## 2021-07-09 NOTE — Therapy (Signed)
[facility] [address] [place], [place], [address] Phone: [phone]   Fax:  [phone]  Occupational Therapy Treatment & 30th visits Progress Note  Patient Details  Name: Brian Espinoza MRN: [id number] Date of Birth: 09/27/1955 Referring Provider (OT): Aundra Dubin (Will send certification to PCP [place])   Encounter Date: 07/09/2021   OT End of Session - 07/09/21 1407     Visit Number 30    Number of Visits 63    Date for OT Re-Evaluation 09/18/21    Authorization Type Medicare and Federal BCBS    Progress Note Due on Visit 40    OT Start Time 1404    OT Stop Time 1445    OT Time Calculation (min) 41 min    Equipment Utilized During Treatment universal cuff, modified stylus, zipper pull    Activity Tolerance Patient tolerated treatment well    Behavior During Therapy WFL for tasks assessed/performed             Past Medical History:  Diagnosis Date   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 No pertinent surgical history.  There were no vitals filed for this visit.   Subjective Assessment - 07/09/21 1407     Subjective  "uneventful"    Currently in Pain? No/denies    Pain Score 0-No pain                          OT Treatments/Exercises (OP) - 07/09/21 0001       Neurological Re-education Exercises   Other Exercises 2 Universal Gym with active hands x 12 reps x 2 sets for scap protraction/chest press, scap retraction    Other Weight-Bearing Exercises 1 light/modified wheelchar pushups with max A For maintaining BLE positioning. Pt continued to hve difficulty with maintaining RUE on arm but demonstrated quad and tricep activation for exericses for min lift.  with dycem under BUE                      OT Short Term Goals - 07/09/21 1408       [place] #1   Title Patient and caregiver will complete a home exercise program designed to improve strength in proximal UE's - shoulders and elbows    Time 4    Period Weeks    Status Achieved   issued AROM exercises and gentle theraband tricep strengthening 04/30/21   Target Date 04/21/21      OT SHORT TERM GOAL #2   Title Following set up patient will hold a cup with lid and long straw in midline to allow him to take a sip on his own    Time 4    Period Weeks    Status Achieved   pt is using [place] with set up assistance. 07/09/20     OT SHORT TERM GOAL #3   Title While seated in upright supported position, patient will reach forward to make contact with upright target on table top (chest height) x 5 each arm    Time 4    Period Weeks    Status Achieved      OT SHORT TERM GOAL #4   Title Patient  will be able to reach toward face with either right or left hand in preparation to scratch chin/cheek    Time 4    Period Weeks    Status Achieved      OT SHORT TERM GOAL #5   Title Patient will utilize cylindrical grip to hold small water bottle in one hand.    Time 4    Period Weeks    Status Partially Met   demonstrated holding bottle in RUE however not consistently 04/30/21              OT Long Term Goals - 07/09/21 1425       OT LONG TERM GOAL #1   Title Patient will complete an updated stretching program to prevent contracture in digits    Time 12    Period Weeks    Status Achieved      OT LONG TERM GOAL #2   Title Patient and caregiver will demonstrate awareness of splint wear and care for BUE    Time 12    Period Weeks    Status Achieved   continue to modifiy and address PRN     OT LONG TERM GOAL #3   Title Patient will wash his face and upper body and upper legs with min assist    Time 12    Period Weeks    Status On-going   continuing to  progress towards this goal     OT LONG TERM GOAL #4   Title Patient will actively bridge in bed to assist with pericare as needed    Time 12    Period Weeks    Status Partially Met   continuing to progress towards goal     OT LONG TERM GOAL #5   Title Patient will transfer to adapted commode with max assist in prep for toileting    Time 12    Period Weeks    Status On-going   working on troubleshooting [facility] and transfers for continuing to progess towards this goal     OT LONG TERM GOAL #6   Title Patient will transfer into/ out of shower with mod assist, and min assist for seated control during shower    Time 12    Period Weeks    Status On-going   much improvement with control and working towards goal     OT LONG TERM GOAL #7   Title Patient will feed himself 30% meal using utensils after set up    Time 12    Period Weeks    Status On-going    Target Date 09/18/21      OT LONG TERM GOAL #8   Title Patient will assist with pulling pants over hips (up/down) to aide with toileting    Time 12    Period Weeks    Status On-going    Target Date 09/18/21                   Plan - 07/09/21 1508     Clinical Impression Statement This note serves as progress note for 30th visit. Pt continues to make progress with BUE strengthening, coordination and increasing independence with ADLs and IADLs. Skilled occupational therapy is recommended to target listed areas of deficit and increase independence towards ADLs and meeting remaining goals.    OT Occupational Profile and History Comprehensive Assessment- Review of records and extensive additional review of physical, cognitive, psychosocial history related to current functional performance    Occupational performance  deficits (Please refer to evaluation for details): ADL's;IADL's;Rest and Sleep;Leisure    Body Structure / Function / Physical Skills ADL;Decreased knowledge of use of DME;Strength;Balance;Dexterity;GMC;Pain;Tone;Body  mechanics;Proprioception;UE functional use;Cardiopulmonary status limiting activity;Endurance;IADL;ROM;Fascial restriction;Improper spinal/pelvic alignment;Coordination;Flexibility;Mobility;Sensation;Wound;FMC;Muscle spasms;Skin integrity    Rehab Potential Good    Clinical Decision Making Multiple treatment options, significant modification of task necessary    Comorbidities Affecting Occupational Performance: Presence of comorbidities impacting occupational performance    Comorbidities impacting occupational performance description: AIDP, Chronic back pain, Descending thoracic aortic anneurysm    Modification or Assistance to Complete Evaluation  Min-Moderate modification of tasks or assist with assess necessary to complete eval    OT Frequency 3x / week    OT Duration 12 weeks    OT Treatment/Interventions Self-care/ADL training;Moist Heat;Fluidtherapy;DME and/or AE instruction;Splinting;Balance training;Aquatic Therapy;Contrast Bath;Therapeutic activities;Ultrasound;Therapeutic exercise;Passive range of motion;Functional Mobility Training;Neuromuscular education;Electrical Stimulation;Manual Therapy;Patient/family education;Energy conservation    Plan equalizer with active hands, arm bike with aactive hands, grasp and release and coordination with functioanl tasks with BUE    Consulted and Agree with Plan of Care Patient             Patient will benefit from skilled therapeutic intervention in order to improve the following deficits and impairments:   Body Structure / Function / Physical Skills: ADL, Decreased knowledge of use of DME, Strength, Balance, Dexterity, GMC, Pain, Tone, Body mechanics, Proprioception, UE functional use, Cardiopulmonary status limiting activity, Endurance, IADL, ROM, Fascial restriction, Improper spinal/pelvic alignment, Coordination, Flexibility, Mobility, Sensation, Wound, FMC, Muscle spasms, Skin integrity       Visit Diagnosis: Other abnormalities of gait  and mobility  Muscle weakness (generalized)  Other disturbances of skin sensation  Unsteadiness on feet  Stiffness of right hand, not elsewhere classified  Stiffness of left hand, not elsewhere classified  Other lack of coordination    Problem List Patient Active Problem List   Diagnosis Date Noted   Acute on chronic respiratory failure with hypoxia (HCC)    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

## 2021-07-09 NOTE — Therapy (Addendum)
[facility] [address] [place], [place], [address] Phone: [phone]   Fax:  [phone]  Physical Therapy Treatment  Patient Details  Name: Brian Espinoza MRN: [id number] Date of Birth: 04-24-1956 Referring Provider (PT): Alison Murray, MD   Encounter Date: 07/09/2021   PT End of Session - 07/09/21 2110     Visit Number 33    Number of Visits 52    Date for PT Re-Evaluation 08/16/21    Authorization Type Medicare A&B; BCBS - 10th visit PN    Authorization Time Period KX MODIFIER REQUIRED NOW    Progress Note Due on Visit 40    PT Start Time 1315    PT Stop Time 1400    PT Time Calculation (min) 45 min    Activity Tolerance Patient tolerated treatment well    Behavior During Therapy WFL for tasks assessed/performed             Past Medical History:  Diagnosis Date   Acute inflammatory demyelinating polyneuropathy (HCC)    Acute on chronic respiratory failure with hypoxia (HCC)    Atrial fibrillation with RVR (HCC)    Dysautonomia (HCC)    Healthcare associated bacterial pneumonia     No past surgical history on file.  There were no vitals filed for this visit.   Subjective Assessment - 07/09/21 1318     Subjective Wife is going to come on Wednesday for car transfer trial.  Is going to go to granddaughter's basketball tournament tonight.  Today pt felt the pressure and pushing through LE when transferring into the chair.    Pertinent History Chest pain, dissection of descending thoracic aorta, aneurysm of artery, a-fib, HTN, systolic CHF, oral phase dysphagia, sacral wound, PNA    Patient Stated Goals Improve hand function and walk to be more independent with daily activities.  Gain strength in legs to be able to lift hips and reduce shear on sacrum.    Currently in Pain? No/denies               [facility] Adult PT Treatment/Exercise - 07/09/21 1328       Knee/Hip Exercises: Aerobic   Other Aerobic  Pt performed with BLE and BUE with active hands utilized to keep hands in place and belt around LE to maintain neutral hip alignment. PT stabilized w/c to prevent tipping. Verbal cues to push with one leg at a time to get started to help dissociate movements.SciFit x 8 min level 2.8. BP=140/62; HR: 74             Donned R [place] GRAFO on RLE and L Thuasne GRAFO on LLE and used dysom on // bars to prevent UE from slipping.  Pt's daughter monitored the wheelchair to prevent tipping.    With PT blocking feet pt able to scoot forwards in chair with min A.  PT performed hand over hand assistance on LUE and provided mod A to shift weight forwards over feet when coming to stand.  Pt able to hold // bars with RUE and performed prolonged standing with therapist providing support at trunk and LUE while cuing pt for lateral weight shifting, and positioning of pelvis over BOS to decrease genu recurvatum.  During second sit > stand daughter assisted with hand over hand on R side to allow pt to perform weight shift to R; performed multiple attempts to advance LLE forwards but unable to lift or advance.  PT assisted with controlled stand > sit back  into chair.  During third sit > stand, performed weight shift to LLE and attempted to advance RLE, pt able to lift R foot from floor but R foot crossed midline over L.  Pt experienced L knee buckling and required PT to control lower to chair.    GRAFO's doffed and pt handed off to OT.    PT Education - 07/09/21 2109     Education Details will trial car transfer on Wednesday    Person(s) Educated Patient    Methods Explanation    Comprehension Verbalized understanding              PT Short Term Goals - 05/18/21 1324       PT SHORT TERM GOAL #1   Title = LTG for 30 days 1/22, 60 days 2/21, 90 days 3/23               PT Long Term Goals - 06/15/21 1731       PT LONG TERM GOAL #1   Title Pt and wife will demonstrate independence with progressed  HEP (LTG set at 30 days - reset to 60 days 2/21 and 90 days 3/23)    Baseline 06/15/21 PT continues to update. Pt has been performing current one.    Time 4    Period Weeks    Status On-going    Target Date 07/17/21      PT LONG TERM GOAL #2   Title Pt will demonstrate ability to perform supine <> sit on flat mat with min A    Baseline mod-max A. 06/15/21 min assist with sit to supine at feet, max assist with supine to sit    Time 4    Period Weeks    Status On-going    Target Date 07/17/21      PT LONG TERM GOAL #3   Title Pt will perform squat-scooting transfers w/c <> mat consistently to L and R on level surface with mod A    Baseline mod-max A. 06/15/21 mod/max assist but performing consistently with caregiver at home now instead of [facility]    Time 4    Period Weeks    Status On-going    Target Date 07/17/21      PT LONG TERM GOAL #4   Title Pt will be able to perform sit to/from stand mod assist of 1 person.    Baseline 06/15/21 pt is total assist for sit to stand as needs 2 people but only min assist from each person. Ability does vary if knee buckle. Was able to stand for 5 min with min/mod assist of 2. Min assist of +2 (so total assist) to sit.    Time 4    Period Weeks    Status Revised      PT LONG TERM GOAL #5   Title Pt will be able to maintain standing >5 min mod assist of 1 person with appropriate braces on legs for improved balance and functional strength.    Baseline 06/15/21 currently 5 min total asist (as 2 people min/mod assist to help)    Time 4    Period Weeks    Status New    Target Date 07/17/21              Plan - 07/09/21 2111     Clinical Impression Statement Continued to progress aerobic conditioning and strengthening with SciFit by increasing resistance without significant increase in BP.  Donned trial AFOs and returned to performing sit >  stand, weight shifting and initiating stepping forwards.  Pt able to hold // bars with RUE but still requires  hand over hand assistance on LUE.  Continues to experience knee buckling when attempting to advance either R or LLE.    Comorbidities Chest pain, dissection of descending thoracic aorta, aneurysm of artery, a-fib, HTN, systolic CHF, oral phase dysphagia, sacral wound, PNA    Rehab Potential Good    PT Frequency 3x / week    PT Duration 12 weeks    PT Treatment/Interventions ADLs/Self Care Home Management;DME Instruction;Gait training;Stair training;Functional mobility training;Therapeutic activities;Therapeutic exercise;Balance training;Neuromuscular re-education;Wheelchair mobility training;Orthotic Fit/Training;Patient/family education;Energy conservation    PT Next Visit Plan Wife is bringing car on Wednesday to trial car transfer; may need second set of hands!  SciFit for 8 minutes at 2.8 resistance.  Continue with use of elevated mat with 14" step and padding to allow WB through forearms with weight shifting trying to keep soft knees if have 2nd person.  If +2 can also go into // bars and have him work on sit > stand - dysom on bars for grip, hand over hand to maintain hand position - may need to wear AFO and knee cage to prevent knee from locking into extension.  Left anterior Thuasne AFO and right anterior ottobock that has medial post was most comfortable for standing.  Continue to work on grading movements with sit <> supine and rolling.  MONITOR BP: Keep SBP between 120-130!    Consulted and Agree with Plan of Care Patient             Patient will benefit from skilled therapeutic intervention in order to improve the following deficits and impairments:  Cardiopulmonary status limiting activity, Decreased activity tolerance, Decreased balance, Decreased coordination, Decreased endurance, Decreased mobility, Decreased strength, Difficulty walking, Impaired sensation, Impaired tone, Impaired UE functional use, Postural dysfunction, Pain, Abnormal gait  Visit Diagnosis: Transient  paralysis  Other abnormalities of gait and mobility  Other disturbances of skin sensation  Muscle weakness (generalized)  Unsteadiness on feet     Problem List Patient Active Problem List   Diagnosis Date Noted   Acute on chronic respiratory failure with hypoxia (HCC)    Acute inflammatory demyelinating polyneuropathy (HCC)    Dysautonomia (HCC)    Atrial fibrillation with RVR (HCC)    Healthcare associated bacterial pneumonia     Dierdre Highman, PT, DPT 07/09/21    9:34 PM  [facility] [address] [place], [place], [address] Phone: [phone]   Fax:  [phone]  Name: Prentice Sackrider MRN: [id number] Date of Birth: [birth year]

## 2021-07-11 ENCOUNTER — Other Ambulatory Visit: Payer: Self-pay

## 2021-07-11 ENCOUNTER — Encounter: Payer: Self-pay | Admitting: Occupational Therapy

## 2021-07-11 ENCOUNTER — Ambulatory Visit: Payer: Medicare Other | Admitting: Occupational Therapy

## 2021-07-11 ENCOUNTER — Ambulatory Visit: Payer: Medicare Other

## 2021-07-11 DIAGNOSIS — R278 Other lack of coordination: Secondary | ICD-10-CM

## 2021-07-11 DIAGNOSIS — M25641 Stiffness of right hand, not elsewhere classified: Secondary | ICD-10-CM

## 2021-07-11 DIAGNOSIS — R2689 Other abnormalities of gait and mobility: Secondary | ICD-10-CM

## 2021-07-11 DIAGNOSIS — M25642 Stiffness of left hand, not elsewhere classified: Secondary | ICD-10-CM

## 2021-07-11 DIAGNOSIS — M6281 Muscle weakness (generalized): Secondary | ICD-10-CM

## 2021-07-11 DIAGNOSIS — R2681 Unsteadiness on feet: Secondary | ICD-10-CM

## 2021-07-11 DIAGNOSIS — R208 Other disturbances of skin sensation: Secondary | ICD-10-CM

## 2021-07-11 NOTE — Therapy (Signed)
[facility] [address] [place], [place], [address] Phone: [phone]   Fax:  [phone]  Physical Therapy Treatment  Patient Details  Name: Olan Loughnane MRN: [id number] Date of Birth: Apr 14, 1956 Referring Provider (PT): Alison Murray, MD   Encounter Date: 07/11/2021   PT End of Session - 07/11/21 1318     Visit Number 34    Number of Visits 52    Date for PT Re-Evaluation 08/16/21    Authorization Type Medicare A&B; BCBS - 10th visit PN    Authorization Time Period KX MODIFIER REQUIRED NOW    Progress Note Due on Visit 40    PT Start Time 1316    PT Stop Time 1400    PT Time Calculation (min) 44 min    Equipment Utilized During Treatment Gait belt    Activity Tolerance Patient tolerated treatment well    Behavior During Therapy WFL for tasks assessed/performed             Past Medical History:  Diagnosis Date   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 No pertinent surgical history.  There were no vitals filed for this visit.   Subjective Assessment - 07/11/21 1319     Subjective Pt reports that he went to grandaughter's game the other day. Wife brought car to for therapist to assess for car transfers.    Pertinent History Chest pain, dissection of descending thoracic aorta, aneurysm of artery, a-fib, HTN, systolic CHF, oral phase dysphagia, sacral wound, PNA    Patient Stated Goals Improve hand function and walk to be more independent with daily activities.  Gain strength in legs to be able to lift hips and reduce shear on sacrum.    Currently in Pain? No/denies                               [facility] Adult PT Treatment/Exercise - 07/11/21 1320       Transfers   Transfers Squat Pivot  Transfers    Squat Pivot Transfers 3: Mod assist;2: Max Probation officer Details (indicate cue type and reason) To work up to trying to perform car transfer in future PT started with  mat raised to 25" and performed squat pivot to mat with initial scooting forwards in chair one hip at a time with PT keeping feet on floor and cuing pt to lean forward to bear weight on legs and lean to opposite side with PT helping min assist at hips. Pt able to scoot right hip forward easier. Transfer was mod/max assist making sure to keep weight forward over legs. Wife observing and then performed the transfer back sitting in chair and then w/c to/from mat again. Cued wife to give pt time to load through his feet and be sure feet are flat on floor prior to transfer. Also cued pt to lean forwards over her to allow bottom to come up. Pt reported the transfer felt harder with wife as does not seem to come forward as much. PT performed again w/c to/from mat mod/max assist allowing more time for pt to lean forward. For scooting back in chair also had him lean forward to be able to scoot bottom back.  Comments Car measurements were take on [employer]: 27" height and 7" depth to get back to seat.                     PT Education - 07/11/21 1436     Education Details Transfer training with wife to start to work towards car transfer to higher height. Discussed squat pivot without slideboard as long as not a large gap at home to bed. Pt struggling with scooting to edge of bed due to lip of air mattress.    Person(s) Educated Patient    Methods Explanation    Comprehension Verbalized understanding              PT Short Term Goals - 05/18/21 1324       PT SHORT TERM GOAL #1   Title = LTG for 30 days 1/22, 60 days 2/21, 90 days 3/23               PT Long Term Goals - 06/15/21 1731       PT LONG TERM GOAL #1   Title Pt and wife will demonstrate independence with progressed HEP (LTG  set at 30 days - reset to 60 days 2/21 and 90 days 3/23)    Baseline 06/15/21 PT continues to update. Pt has been performing current one.    Time 4    Period Weeks    Status On-going    Target Date 07/17/21      PT LONG TERM GOAL #2   Title Pt will demonstrate ability to perform supine <> sit on flat mat with min A    Baseline mod-max A. 06/15/21 min assist with sit to supine at feet, max assist with supine to sit    Time 4    Period Weeks    Status On-going    Target Date 07/17/21      PT LONG TERM GOAL #3   Title Pt will perform squat-scooting transfers w/c <> mat consistently to L and R on level surface with mod A    Baseline mod-max A. 06/15/21 mod/max assist but performing consistently with caregiver at home now instead of [facility]    Time 4    Period Weeks    Status On-going    Target Date 07/17/21      PT LONG TERM GOAL #4   Title Pt will be able to perform sit to/from stand mod assist of 1 person.    Baseline 06/15/21 pt is total assist for sit to stand as needs 2 people but only min assist from each person. Ability does vary if knee buckle. Was able to stand for 5 min with min/mod assist of 2. Min assist of +2 (so total assist) to sit.    Time 4    Period Weeks    Status Revised      PT LONG TERM GOAL #5   Title Pt will be able to maintain standing >5 min mod assist of 1 person with appropriate braces on legs for improved balance and functional strength.    Baseline 06/15/21 currently 5 min total asist (as 2 people min/mod assist to help)    Time 4    Period Weeks    Status New    Target Date 07/17/21                   Plan - 07/11/21 1439     Clinical Impression Statement PT assessed car to begin to troubleshoot  for car transfer. Seat height is 27" with 7" depth from edge of car to seat. Started to work on higher squat pivot transfers to elevated mat at 25" with wife also participating. Pt will need continued practice to work towards car transfers as not safe at  this time.    Comorbidities Chest pain, dissection of descending thoracic aorta, aneurysm of artery, a-fib, HTN, systolic CHF, oral phase dysphagia, sacral wound, PNA    Rehab Potential Good    PT Frequency 3x / week    PT Duration 12 weeks    PT Treatment/Interventions ADLs/Self Care Home Management;DME Instruction;Gait training;Stair training;Functional mobility training;Therapeutic activities;Therapeutic exercise;Balance training;Neuromuscular re-education;Wheelchair mobility training;Orthotic Fit/Training;Patient/family education;Energy conservation    PT Next Visit Plan Need to work on schedule for PT last week for February and first week in March as only has OT. Can we slide some pts to new staff to open up our availability Audra. I tried to start working on it today. May want to go on and schedule out through June so this does not happen again. Continue to work on squat/pivot transfers to higher surfaces to eventually perform car transfer. Need 27" height with 7" depth for car.  SciFit for 8 minutes at 2.8 resistance.  Continue with use of elevated mat with 14" step and padding to allow WB through forearms with weight shifting trying to keep soft knees if have 2nd person.  If +2 can also go into // bars and have him work on sit > stand - dysom on bars for grip, hand over hand to maintain hand position - may need to wear AFO and knee cage to prevent knee from locking into extension.  Left anterior Thuasne AFO and right anterior ottobock that has medial post was most comfortable for standing.  Continue to work on grading movements with sit <> supine and rolling.  MONITOR BP: Keep SBP between 120-130!    Consulted and Agree with Plan of Care Patient             Patient will benefit from skilled therapeutic intervention in order to improve the following deficits and impairments:  Cardiopulmonary status limiting activity, Decreased activity tolerance, Decreased balance, Decreased coordination,  Decreased endurance, Decreased mobility, Decreased strength, Difficulty walking, Impaired sensation, Impaired tone, Impaired UE functional use, Postural dysfunction, Pain, Abnormal gait  Visit Diagnosis: Other abnormalities of gait and mobility  Muscle weakness (generalized)     Problem List Patient Active Problem List   Diagnosis Date Noted   Acute on chronic respiratory failure with hypoxia (HCC)    Acute inflammatory demyelinating polyneuropathy (HCC)    Dysautonomia (HCC)    Atrial fibrillation with RVR (HCC)    Healthcare associated bacterial pneumonia     Ronn Melena, PT, DPT, NCS 07/11/2021, 2:46 PM  [facility] [address] [place], [place], [address] Phone: [phone]   Fax:  [phone]  Name: Bo Teicher MRN: [id number] Date of Birth: 1956-05-18

## 2021-07-11 NOTE — Therapy (Signed)
[facility] [address] [place], [place], [address] Phone: [phone]   Fax:  [phone]  Occupational Therapy Treatment  Patient Details  Name: Stein Windhorst MRN: [id number] Date of Birth: 07-13-1955 Referring Provider (OT): Aundra Dubin (Will send certification to PCP [place])   Encounter Date: 07/11/2021   OT End of Session - 07/11/21 1404     Visit Number 31    Number of Visits 74    Date for OT Re-Evaluation 09/18/21    Authorization Type Medicare and Federal BCBS    Progress Note Due on Visit 40    OT Start Time 1402    OT Stop Time 1445    OT Time Calculation (min) 43 min    Equipment Utilized During Treatment universal cuff, modified stylus, zipper pull    Activity Tolerance Patient tolerated treatment well    Behavior During Therapy WFL for tasks assessed/performed             Past Medical History:  Diagnosis Date   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 No pertinent surgical history.  There were no vitals filed for this visit.   Subjective Assessment - 07/11/21 1403     Subjective  "I went to my granddaughter's basketball game"    Currently in Pain? No/denies    Pain Score 0-No pain                          OT Treatments/Exercises (OP) - 07/11/21 0001       Neurological Re-education Exercises   Other Exercises 2 [employer] with active hands x 12 reps x 2 sets for scap protraction/chest press (15 #), scap retraction , lat pull downs (10#)    Other Grasp and Release Exercises  with color dowel pegs in RUE with removing from board with mod difficulty Pt attempted to place pegs with LUE and RUE but with max difficulty.                       OT Short Term Goals - 07/09/21 1408       OT SHORT TERM GOAL #1   Title Patient and caregiver will complete a home exercise program designed to improve strength in proximal UE's - shoulders and elbows    Time 4    Period Weeks    Status Achieved   issued AROM exercises and gentle theraband tricep strengthening 04/30/21   Target Date 04/21/21      OT SHORT TERM GOAL #2   Title Following set up patient will hold a cup with lid and long straw in midline to allow him to take a sip on his own    Time 4    Period Weeks    Status Achieved   pt is using [employer] with set up assistance. 07/09/20     OT SHORT TERM GOAL #3   Title While seated in upright supported position, patient will reach forward to make contact with upright target on table top (chest height) x 5 each arm    Time 4    Period Weeks    Status Achieved      OT SHORT TERM GOAL #4   Title  Patient will be able to reach toward face with either right or left hand in preparation to scratch chin/cheek    Time 4    Period Weeks    Status Achieved      OT SHORT TERM GOAL #5   Title Patient will utilize cylindrical grip to hold small water bottle in one hand.    Time 4    Period Weeks    Status Partially Met   demonstrated holding bottle in RUE however not consistently 04/30/21              OT Long Term Goals - 07/09/21 1425       OT LONG TERM GOAL #1   Title Patient will complete an updated stretching program to prevent contracture in digits    Time 12    Period Weeks    Status Achieved      OT LONG TERM GOAL #2   Title Patient and caregiver will demonstrate awareness of splint wear and care for BUE    Time 12    Period Weeks    Status Achieved   continue to modifiy and address PRN     OT LONG TERM GOAL #3   Title Patient will wash his face and upper body and upper legs with min assist    Time 12    Period Weeks    Status On-going   continuing to progress towards this goal     OT LONG  TERM GOAL #4   Title Patient will actively bridge in bed to assist with pericare as needed    Time 12    Period Weeks    Status Partially Met   continuing to progress towards goal     OT LONG TERM GOAL #5   Title Patient will transfer to adapted commode with max assist in prep for toileting    Time 12    Period Weeks    Status On-going   working on troubleshooting [facility] and transfers for continuing to progess towards this goal     OT LONG TERM GOAL #6   Title Patient will transfer into/ out of shower with mod assist, and min assist for seated control during shower    Time 12    Period Weeks    Status On-going   much improvement with control and working towards goal     OT LONG TERM GOAL #7   Title Patient will feed himself 30% meal using utensils after set up    Time 12    Period Weeks    Status On-going    Target Date 09/18/21      OT LONG TERM GOAL #8   Title Patient will assist with pulling pants over hips (up/down) to aide with toileting    Time 12    Period Weeks    Status On-going    Target Date 09/18/21                   Plan - 07/11/21 1558     Clinical Impression Statement Pt continues to progress with BUE strengthening - Pt benefitting from therapy for increasing BUE coordination and strength for increasing independence with transfers and ADLs.    OT Occupational Profile and History Comprehensive Assessment- Review of records and extensive additional review of physical, cognitive, psychosocial history related to current functional performance    Occupational performance deficits (Please refer to evaluation for details): ADL's;IADL's;Rest and Sleep;Leisure    Body Structure / Function / Physical  Skills ADL;Decreased knowledge of use of DME;Strength;Balance;Dexterity;GMC;Pain;Tone;Body mechanics;Proprioception;UE functional use;Cardiopulmonary status limiting activity;Endurance;IADL;ROM;Fascial restriction;Improper spinal/pelvic  alignment;Coordination;Flexibility;Mobility;Sensation;Wound;FMC;Muscle spasms;Skin integrity    Rehab Potential Good    Clinical Decision Making Multiple treatment options, significant modification of task necessary    Comorbidities Affecting Occupational Performance: Presence of comorbidities impacting occupational performance    Comorbidities impacting occupational performance description: AIDP, Chronic back pain, Descending thoracic aortic anneurysm    Modification or Assistance to Complete Evaluation  Min-Moderate modification of tasks or assist with assess necessary to complete eval    OT Frequency 3x / week    OT Duration 12 weeks    OT Treatment/Interventions Self-care/ADL training;Moist Heat;Fluidtherapy;DME and/or AE instruction;Splinting;Balance training;Aquatic Therapy;Contrast Bath;Therapeutic activities;Ultrasound;Therapeutic exercise;Passive range of motion;Functional Mobility Training;Neuromuscular education;Electrical Stimulation;Manual Therapy;Patient/family education;Energy conservation    Plan equalizer with active hands, arm bike with aactive hands, grasp and release and coordination with functioanl tasks with BUE    Consulted and Agree with Plan of Care Patient             Patient will benefit from skilled therapeutic intervention in order to improve the following deficits and impairments:   Body Structure / Function / Physical Skills: ADL, Decreased knowledge of use of DME, Strength, Balance, Dexterity, GMC, Pain, Tone, Body mechanics, Proprioception, UE functional use, Cardiopulmonary status limiting activity, Endurance, IADL, ROM, Fascial restriction, Improper spinal/pelvic alignment, Coordination, Flexibility, Mobility, Sensation, Wound, FMC, Muscle spasms, Skin integrity       Visit Diagnosis: Other abnormalities of gait and mobility  Muscle weakness (generalized)  Other disturbances of skin sensation  Unsteadiness on feet  Stiffness of left hand, not  elsewhere classified  Stiffness of right hand, not elsewhere classified  Other lack of coordination    Problem List Patient Active Problem List   Diagnosis Date Noted   Acute on chronic respiratory failure with hypoxia (HCC)    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 [place], [place], [address] Phone: [phone]   Fax:  [phone]  Name: Braelon Sprung MRN: [id number] Date of Birth: Aug 29, 1955

## 2021-07-13 ENCOUNTER — Other Ambulatory Visit: Payer: Self-pay

## 2021-07-13 ENCOUNTER — Ambulatory Visit: Payer: Medicare Other | Admitting: Physical Therapy

## 2021-07-13 ENCOUNTER — Encounter: Payer: Self-pay | Admitting: Occupational Therapy

## 2021-07-13 ENCOUNTER — Ambulatory Visit: Payer: Medicare Other | Admitting: Occupational Therapy

## 2021-07-13 DIAGNOSIS — R208 Other disturbances of skin sensation: Secondary | ICD-10-CM

## 2021-07-13 DIAGNOSIS — R2689 Other abnormalities of gait and mobility: Secondary | ICD-10-CM

## 2021-07-13 DIAGNOSIS — M6281 Muscle weakness (generalized): Secondary | ICD-10-CM

## 2021-07-13 DIAGNOSIS — M25642 Stiffness of left hand, not elsewhere classified: Secondary | ICD-10-CM

## 2021-07-13 DIAGNOSIS — R278 Other lack of coordination: Secondary | ICD-10-CM

## 2021-07-13 DIAGNOSIS — R295 Transient paralysis: Secondary | ICD-10-CM

## 2021-07-13 DIAGNOSIS — R2681 Unsteadiness on feet: Secondary | ICD-10-CM

## 2021-07-13 DIAGNOSIS — M25641 Stiffness of right hand, not elsewhere classified: Secondary | ICD-10-CM

## 2021-07-13 NOTE — Therapy (Signed)
[facility] [address] [place], [place], [address] Phone: [phone]   Fax:  [phone]  Occupational Therapy Treatment  Patient Details  Name: Brian Espinoza MRN: [id number] Date of Birth: [birth year] Referring Provider (OT): Aundra Dubin (Will send certification to PCP [place])   Encounter Date: 07/13/2021   OT End of Session - 07/13/21 1359     Visit Number 32    Number of Visits 98    Date for OT Re-Evaluation 09/18/21    Authorization Type Medicare and Federal BCBS    Progress Note Due on Visit 76    OT Start Time 1410    OT Stop Time 1450    OT Time Calculation (min) 40 min    Equipment Utilized During Treatment universal cuff, modified stylus, zipper pull    Activity Tolerance Patient tolerated treatment well    Behavior During Therapy WFL for tasks assessed/performed             Past Medical History:  Diagnosis Date   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 No pertinent surgical history.  There were no vitals filed for this visit.   Subjective Assessment - 07/13/21 1412     Subjective  Pt denies any pain or changes    Currently in Pain? No/denies    Pain Score 0-No pain                          OT Treatments/Exercises (OP) - 07/13/21 1415       Additional Elbow Exercises   UBE (Upper Arm Bike) x 6 minutes with forward and backward movements for reciprocal moevments and conditioning. Using active hands BUE      Neurological Re-education Exercises   Other Exercises 1 picking up and placing 1 inch blocks on elecated surface (approx 6 inches) with RUE and then switching to LUE and placing 1 inch blocks in hole at approx 20" . Pt with good coordination  and sustained grasp.    Other Exercises 2 working on actie finger extension on shelf liner and plastic bags for increased BUE digit extension                      OT Short Term Goals - 07/09/21 1408       OT SHORT TERM GOAL #1   Title Patient and caregiver will complete a home exercise program designed to improve strength in proximal UE's - shoulders and elbows    Time 4    Period Weeks    Status Achieved   issued AROM exercises and gentle theraband tricep strengthening 04/30/21   Target Date 04/21/21      OT SHORT TERM GOAL #2   Title Following set up patient will hold a cup with lid and long straw in midline to allow him to take a sip on his own    Time 4    Period Weeks    Status Achieved   pt is using [employer] with set up assistance. 07/09/20     OT SHORT TERM GOAL #3   Title While seated in upright supported position, patient will reach forward to make contact with upright target on table top (chest height) x  5 each arm    Time 4    Period Weeks    Status Achieved      OT SHORT TERM GOAL #4   Title Patient will be able to reach toward face with either right or left hand in preparation to scratch chin/cheek    Time 4    Period Weeks    Status Achieved      OT SHORT TERM GOAL #5   Title Patient will utilize cylindrical grip to hold small water bottle in one hand.    Time 4    Period Weeks    Status Partially Met   demonstrated holding bottle in RUE however not consistently 04/30/21              OT Long Term Goals - 07/09/21 1425       OT LONG TERM GOAL #1   Title Patient will complete an updated stretching program to prevent contracture in digits    Time 12    Period Weeks    Status Achieved      OT LONG TERM GOAL #2   Title Patient and caregiver will demonstrate awareness of splint wear and care for BUE    Time 12    Period Weeks    Status Achieved   continue to modifiy and address PRN     OT LONG TERM GOAL #3   Title Patient will wash his  face and upper body and upper legs with min assist    Time 12    Period Weeks    Status On-going   continuing to progress towards this goal     OT LONG TERM GOAL #4   Title Patient will actively bridge in bed to assist with pericare as needed    Time 12    Period Weeks    Status Partially Met   continuing to progress towards goal     OT LONG TERM GOAL #5   Title Patient will transfer to adapted commode with max assist in prep for toileting    Time 12    Period Weeks    Status On-going   working on troubleshooting [facility] and transfers for continuing to progess towards this goal     OT LONG TERM GOAL #6   Title Patient will transfer into/ out of shower with mod assist, and min assist for seated control during shower    Time 12    Period Weeks    Status On-going   much improvement with control and working towards goal     OT LONG TERM GOAL #7   Title Patient will feed himself 30% meal using utensils after set up    Time 12    Period Weeks    Status On-going    Target Date 09/18/21      OT LONG TERM GOAL #8   Title Patient will assist with pulling pants over hips (up/down) to aide with toileting    Time 12    Period Weeks    Status On-going    Target Date 09/18/21                   Plan - 07/13/21 1406     Clinical Impression Statement Pt continuing to be motivated for progressing towards incrased independence and skills.    OT Occupational Profile and History Comprehensive Assessment- Review of records and extensive additional review of physical, cognitive, psychosocial history related to current functional performance    Occupational performance deficits (  Please refer to evaluation for details): ADL's;IADL's;Rest and Sleep;Leisure    Body Structure / Function / Physical Skills ADL;Decreased knowledge of use of DME;Strength;Balance;Dexterity;GMC;Pain;Tone;Body mechanics;Proprioception;UE functional use;Cardiopulmonary status limiting activity;Endurance;IADL;ROM;Fascial  restriction;Improper spinal/pelvic alignment;Coordination;Flexibility;Mobility;Sensation;Wound;FMC;Muscle spasms;Skin integrity    Rehab Potential Good    Clinical Decision Making Multiple treatment options, significant modification of task necessary    Comorbidities Affecting Occupational Performance: Presence of comorbidities impacting occupational performance    Comorbidities impacting occupational performance description: AIDP, Chronic back pain, Descending thoracic aortic anneurysm    Modification or Assistance to Complete Evaluation  Min-Moderate modification of tasks or assist with assess necessary to complete eval    OT Frequency 3x / week    OT Duration 12 weeks    OT Treatment/Interventions Self-care/ADL training;Moist Heat;Fluidtherapy;DME and/or AE instruction;Splinting;Balance training;Aquatic Therapy;Contrast Bath;Therapeutic activities;Ultrasound;Therapeutic exercise;Passive range of motion;Functional Mobility Training;Neuromuscular education;Electrical Stimulation;Manual Therapy;Patient/family education;Energy conservation    Plan equalizer with active hands, arm bike with aactive hands, grasp and release and coordination with functioanl tasks with BUE    Consulted and Agree with Plan of Care Patient             Patient will benefit from skilled therapeutic intervention in order to improve the following deficits and impairments:   Body Structure / Function / Physical Skills: ADL, Decreased knowledge of use of DME, Strength, Balance, Dexterity, GMC, Pain, Tone, Body mechanics, Proprioception, UE functional use, Cardiopulmonary status limiting activity, Endurance, IADL, ROM, Fascial restriction, Improper spinal/pelvic alignment, Coordination, Flexibility, Mobility, Sensation, Wound, FMC, Muscle spasms, Skin integrity       Visit Diagnosis: Other abnormalities of gait and mobility  Muscle weakness (generalized)  Other disturbances of skin sensation  Unsteadiness on  feet  Stiffness of right hand, not elsewhere classified  Other lack of coordination  Stiffness of left hand, not elsewhere classified    Problem List Patient Active Problem List   Diagnosis Date Noted   Acute on chronic respiratory failure with hypoxia (HCC)    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 [place], [place], [address] Phone: [phone]   Fax:  [phone]  Name: Brian Espinoza MRN: [id number] Date of Birth: [birth year]

## 2021-07-13 NOTE — Therapy (Signed)
[facility] [address] [place], [place], [address] Phone: [phone]   Fax:  [phone]  Physical Therapy Treatment  Patient Details  Name: Brian Espinoza MRN: [id number] Date of Birth: [birth year] Referring Provider (PT): Alison Murray, MD   Encounter Date: 07/13/2021   PT End of Session - 07/13/21 1521     Visit Number 35    Number of Visits 52    Date for PT Re-Evaluation 08/16/21    Authorization Type Medicare A&B; BCBS - 10th visit PN    Authorization Time Period KX MODIFIER REQUIRED NOW    Progress Note Due on Visit 40    PT Start Time 1321    PT Stop Time 1410    PT Time Calculation (min) 49 min    Equipment Utilized During Treatment Gait belt    Activity Tolerance Patient tolerated treatment well    Behavior During Therapy WFL for tasks assessed/performed             Past Medical History:  Diagnosis Date   Acute inflammatory demyelinating polyneuropathy (HCC)    Acute on chronic respiratory failure with hypoxia (HCC)    Atrial fibrillation with RVR (HCC)    Dysautonomia (HCC)    Healthcare associated bacterial pneumonia     No past surgical history on file.  There were no vitals filed for this visit.   Subjective Assessment - 07/13/21 2055     Subjective "Wednesday went fine."  Pt verbalized frustration about upcoming schedule and that there were multiple dates without a PT appointment.    Pertinent History Chest pain, dissection of descending thoracic aorta, aneurysm of artery, a-fib, HTN, systolic CHF, oral phase dysphagia, sacral wound, PNA    Patient Stated Goals Improve hand function and walk to be more independent with daily activities.  Gain strength in legs to be able to lift hips and reduce shear on sacrum.    Currently in Pain? No/denies             [facility] Adult PT Treatment/Exercise - 07/13/21 1348       Transfers   Sit to Stand 3: Mod assist    Sit to Stand Details (indicate  cue type and reason) from wheelchair in // bars with one therapist each providing hand over hand support to prevent hands from slipping from bars.  PT in front provided support at trunk and provided cues to align shoulders and hips over feet    Stand to Sit 3: Mod assist    Stand to Sit Details provided support and cues for leaning forwards and to slowly flex knees with control      Ambulation/Gait   Ambulation/Gait Yes    Ambulation/Gait Assistance 1: +2 Total assist    Ambulation/Gait Assistance Details Required +4 assistance to safely perform: two PTs for hand over hand support on // bars and pull chair behind patient, one PT holding tension on sheet around patient's pelvis to facilitate hip extension and prevent sudden flexion moment, 4th PT in front provided stance phase knee stability, facilitation of weight shifting over stance LE and cues for controlled advancement of contralateral LE    Ambulation Distance (Feet) 10 Feet    Assistive device Parallel bars    Ambulation Surface Level;Indoor      Exercises   Exercises Knee/Hip      Knee/Hip Exercises: Aerobic   Other Aerobic Pt performed with BLE and BUE with active hands utilized to keep hands in place and belt around  LE to maintain neutral hip alignment. PT stabilized w/c to prevent tipping. Verbal cues to push with one leg at a time to get started to help dissociate movements.SciFit x 8 min level 2.9. BP= 160/66 HR: 74              PT Education - 07/13/21 2140     Education Details Discussed how scheduling is performed at clinic and how slots are not "reserved" for specific patients based on how long patient has been receiving care at clinic but are scheduled based on plan of care.  Acknowledged patient's frustration with having a gap in PT care and his desire to continue to progress, apologized for gap in care.  Alerted pt that both primary PT's had full schedules but that they would monitor visits closer and were working on  getting appointments for patient on the same day as OT.  Pt expressed interest in seeking care at another clinic where more visits would be available - acknowledged that pt has the right to seek care where he wishes if unsatisfied with his care at this clinic.  Pt stated he was happy with his care but again verbalized frustration with not having PT appointments for 5 visits.  PT continued to apologize for gap in visits and continued to reassure patient that PTs were working towards adding more visits for him and giving him enough time to schedule transportation.  Pt verbalized understanding and wished to continue with therapy session.    Person(s) Educated Patient    Methods Explanation    Comprehension Verbalized understanding              PT Short Term Goals - 05/18/21 1324       PT SHORT TERM GOAL #1   Title = LTG for 30 days 1/22, 60 days 2/21, 90 days 3/23               PT Long Term Goals - 06/15/21 1731       PT LONG TERM GOAL #1   Title Pt and wife will demonstrate independence with progressed HEP (LTG set at 30 days - reset to 60 days 2/21 and 90 days 3/23)    Baseline 06/15/21 PT continues to update. Pt has been performing current one.    Time 4    Period Weeks    Status On-going    Target Date 07/17/21      PT LONG TERM GOAL #2   Title Pt will demonstrate ability to perform supine <> sit on flat mat with min A    Baseline mod-max A. 06/15/21 min assist with sit to supine at feet, max assist with supine to sit    Time 4    Period Weeks    Status On-going    Target Date 07/17/21      PT LONG TERM GOAL #3   Title Pt will perform squat-scooting transfers w/c <> mat consistently to L and R on level surface with mod A    Baseline mod-max A. 06/15/21 mod/max assist but performing consistently with caregiver at home now instead of [place]    Time 4    Period Weeks    Status On-going    Target Date 07/17/21      PT LONG TERM GOAL #4   Title Pt will be able to perform  sit to/from stand mod assist of 1 person.    Baseline 06/15/21 pt is total assist for sit to stand as needs 2 people but  only min assist from each person. Ability does vary if knee buckle. Was able to stand for 5 min with min/mod assist of 2. Min assist of +2 (so total assist) to sit.    Time 4    Period Weeks    Status Revised      PT LONG TERM GOAL #5   Title Pt will be able to maintain standing >5 min mod assist of 1 person with appropriate braces on legs for improved balance and functional strength.    Baseline 06/15/21 currently 5 min total asist (as 2 people min/mod assist to help)    Time 4    Period Weeks    Status New    Target Date 07/17/21               Plan - 07/13/21 2156     Clinical Impression Statement Pt continues to progress with endurance training and was able to maintain standing and advance each foot forwards to ambulate from one end of parallel bars to the other.  Continues to require significant assistance to perform safely but did not experience LE buckling today and demonstrated 1-2 steps forwards with improved control and placement.    Comorbidities Chest pain, dissection of descending thoracic aorta, aneurysm of artery, a-fib, HTN, systolic CHF, oral phase dysphagia, sacral wound, PNA    Rehab Potential Good    PT Frequency 3x / week    PT Duration 12 weeks    PT Treatment/Interventions ADLs/Self Care Home Management;DME Instruction;Gait training;Stair training;Functional mobility training;Therapeutic activities;Therapeutic exercise;Balance training;Neuromuscular re-education;Wheelchair mobility training;Orthotic Fit/Training;Patient/family education;Energy conservation    PT Next Visit Plan Check goals and reset date; May want to go on and schedule out through June so this does not happen again. Continue to work on [place] transfers to higher surfaces to eventually perform car transfer. Need 27" height with 7" depth for car.  SciFit for 8 minutes at 2.9  resistance.  Continue with use of elevated mat with 14" step and padding to allow WB through forearms with weight shifting trying to keep soft knees if have 2nd person.  If +2 can also go into // bars and have him work on sit > stand - dysom on bars for grip, hand over hand to maintain hand position - may need to wear AFO and knee cage to prevent knee from locking into extension.  Left anterior [place] AFO and right anterior o[place]obock that has medial post was most comfortable for standing.  Continue to work on grading movements with sit <> supine and rolling.  MONITOR BP: Keep SBP between 120-130!    Consulted and Agree with Plan of Care Patient             Patient will benefit from skilled therapeutic intervention in order to improve the following deficits and impairments:  Cardiopulmonary status limiting activity, Decreased activity tolerance, Decreased balance, Decreased coordination, Decreased endurance, Decreased mobility, Decreased strength, Difficulty walking, Impaired sensation, Impaired tone, Impaired UE functional use, Postural dysfunction, Pain, Abnormal gait  Visit Diagnosis: Transient paralysis  Muscle weakness (generalized)  Other disturbances of skin sensation  Unsteadiness on feet  Other abnormalities of gait and mobility     Problem List Patient Active Problem List   Diagnosis Date Noted   Acute on chronic respiratory failure with hypoxia (HCC)    Acute inflammatory demyelinating polyneuropathy (HCC)    Dysautonomia (HCC)    Atrial fibrillation with RVR ([place])    Healthcare associated bacterial pneumonia     Letta Moynahan  Fritz Pickerel, PT, DPT 07/13/21    10:09 PM    [facility] [address] [place], [place], [address] Phone: [phone]   Fax:  [phone]  Name: Brian Espinoza MRN: [id number] Date of Birth: 12-15-1955

## 2021-07-16 ENCOUNTER — Ambulatory Visit: Payer: Medicare Other | Admitting: Physical Therapy

## 2021-07-16 ENCOUNTER — Encounter: Payer: Self-pay | Admitting: Physical Therapy

## 2021-07-16 ENCOUNTER — Ambulatory Visit: Payer: Medicare Other | Admitting: Occupational Therapy

## 2021-07-16 ENCOUNTER — Encounter: Payer: Self-pay | Admitting: Occupational Therapy

## 2021-07-16 ENCOUNTER — Other Ambulatory Visit: Payer: Self-pay

## 2021-07-16 VITALS — BP 138/66 | HR 60

## 2021-07-16 DIAGNOSIS — R2689 Other abnormalities of gait and mobility: Secondary | ICD-10-CM

## 2021-07-16 DIAGNOSIS — R2681 Unsteadiness on feet: Secondary | ICD-10-CM

## 2021-07-16 DIAGNOSIS — R278 Other lack of coordination: Secondary | ICD-10-CM

## 2021-07-16 DIAGNOSIS — M6281 Muscle weakness (generalized): Secondary | ICD-10-CM

## 2021-07-16 DIAGNOSIS — M25641 Stiffness of right hand, not elsewhere classified: Secondary | ICD-10-CM

## 2021-07-16 DIAGNOSIS — R208 Other disturbances of skin sensation: Secondary | ICD-10-CM

## 2021-07-16 DIAGNOSIS — M25642 Stiffness of left hand, not elsewhere classified: Secondary | ICD-10-CM

## 2021-07-16 NOTE — Therapy (Signed)
[facility] [address] [place], [place], [address] Phone: [phone]   Fax:  [phone]  Occupational Therapy Treatment  Patient Details  Name: Brian Espinoza MRN: [id number] Date of Birth: 01/27/1956 Referring Provider (OT): Aundra Dubin (Will send certification to PCP [place])   Encounter Date: 07/16/2021   OT End of Session - 07/16/21 1449     Visit Number 33    Number of Visits 69    Date for OT Re-Evaluation 09/18/21    Authorization Type Medicare and Federal BCBS    Progress Note Due on Visit 40    OT Start Time 1448    OT Stop Time 1530    OT Time Calculation (min) 42 min    Equipment Utilized During Treatment universal cuff, modified stylus, zipper pull    Activity Tolerance Patient tolerated treatment well    Behavior During Therapy WFL for tasks assessed/performed             Past Medical History:  Diagnosis Date   Acute inflammatory demyelinating polyneuropathy ([place])    Acute on chronic respiratory failure with hypoxia (HCC)    Atrial fibrillation with RVR ([place])    Dysautonomia ([place])    Healthcare associated bacterial pneumonia     History reviewed. No pertinent surgical history.  There were no vitals filed for this visit.   Subjective Assessment - 07/16/21 1449     Subjective  "Practiced getting in and out of the bed, car - my legs are sore" (speaking of PT)    Currently in Pain? No/denies                          OT Treatments/Exercises (OP) - 07/16/21 0001       ADLs   Eating reports completing self feeding for entire breakfast today with apple slices and clementines with RUE    UB Dressing donned and doffed shirt with max A For orientation of shirt, manipulation and pulling shirt over head and down in back.      Neurological Re-education Exercises   Other Exercises 1 working on tricep strengthening with modified push ups while edge of  mat x 10 repeptitions with small scoots left to right. Pt can scoot right pretty well but has more difficulty scooting to the left.    Other Exercises 2 Weight bearing into BUE individually with progressing from block/pebble, just pebble and then on edge of mat for increase in range of motion and decreased tone.                      OT Short Term Goals - 07/09/21 1408       OT SHORT TERM GOAL #1   Title Patient and caregiver will complete a home exercise program designed to improve strength in proximal UE's - shoulders and elbows    Time 4    Period Weeks    Status Achieved   issued AROM exercises and gentle theraband tricep strengthening 04/30/21   Target Date 04/21/21      OT SHORT TERM GOAL #2   Title Following set up patient will hold a cup with lid and long straw in midline to allow him to take a sip on his own    Time 4    Period Weeks    Status Achieved   pt is using [place] with set up assistance. 07/09/20  OT SHORT TERM GOAL #3   Title While seated in upright supported position, patient will reach forward to make contact with upright target on table top (chest height) x 5 each arm    Time 4    Period Weeks    Status Achieved      OT SHORT TERM GOAL #4   Title Patient will be able to reach toward face with either right or left hand in preparation to scratch chin/cheek    Time 4    Period Weeks    Status Achieved      OT SHORT TERM GOAL #5   Title Patient will utilize cylindrical grip to hold small water bottle in one hand.    Time 4    Period Weeks    Status Partially Met   demonstrated holding bottle in RUE however not consistently 04/30/21              OT Long Term Goals - 07/09/21 1425       OT LONG TERM GOAL #1   Title Patient will complete an updated stretching program to prevent contracture in digits    Time 12    Period Weeks    Status Achieved      OT LONG TERM GOAL #2   Title Patient and caregiver will demonstrate awareness of  splint wear and care for BUE    Time 12    Period Weeks    Status Achieved   continue to modifiy and address PRN     OT LONG TERM GOAL #3   Title Patient will wash his face and upper body and upper legs with min assist    Time 12    Period Weeks    Status On-going   continuing to progress towards this goal     OT LONG TERM GOAL #4   Title Patient will actively bridge in bed to assist with pericare as needed    Time 12    Period Weeks    Status Partially Met   continuing to progress towards goal     OT LONG TERM GOAL #5   Title Patient will transfer to adapted commode with max assist in prep for toileting    Time 12    Period Weeks    Status On-going   working on troubleshooting [facility] and transfers for continuing to progess towards this goal     OT LONG TERM GOAL #6   Title Patient will transfer into/ out of shower with mod assist, and min assist for seated control during shower    Time 12    Period Weeks    Status On-going   much improvement with control and working towards goal     OT LONG TERM GOAL #7   Title Patient will feed himself 30% meal using utensils after set up    Time 12    Period Weeks    Status On-going    Target Date 09/18/21      OT LONG TERM GOAL #8   Title Patient will assist with pulling pants over hips (up/down) to aide with toileting    Time 12    Period Weeks    Status On-going    Target Date 09/18/21                   Plan - 07/16/21 1553     Clinical Impression Statement Pt continues to progress with unmet goals.    OT Occupational Profile and History  Comprehensive Assessment- Review of records and extensive additional review of physical, cognitive, psychosocial history related to current functional performance    Occupational performance deficits (Please refer to evaluation for details): ADL's;IADL's;Rest and Sleep;Leisure    Body Structure / Function / Physical Skills ADL;Decreased knowledge of use of  DME;Strength;Balance;Dexterity;GMC;Pain;Tone;Body mechanics;Proprioception;UE functional use;Cardiopulmonary status limiting activity;Endurance;IADL;ROM;Fascial restriction;Improper spinal/pelvic alignment;Coordination;Flexibility;Mobility;Sensation;Wound;FMC;Muscle spasms;Skin integrity    Rehab Potential Good    Clinical Decision Making Multiple treatment options, significant modification of task necessary    Comorbidities Affecting Occupational Performance: Presence of comorbidities impacting occupational performance    Comorbidities impacting occupational performance description: AIDP, Chronic back pain, Descending thoracic aortic anneurysm    Modification or Assistance to Complete Evaluation  Min-Moderate modification of tasks or assist with assess necessary to complete eval    OT Frequency 3x / week    OT Duration 12 weeks    OT Treatment/Interventions Self-care/ADL training;Moist Heat;Fluidtherapy;DME and/or AE instruction;Splinting;Balance training;Aquatic Therapy;Contrast Bath;Therapeutic activities;Ultrasound;Therapeutic exercise;Passive range of motion;Functional Mobility Training;Neuromuscular education;Electrical Stimulation;Manual Therapy;Patient/family education;Energy conservation    Plan equalizer with active hands, arm bike with aactive hands, grasp and release and coordination with functioanl tasks with BUE    Consulted and Agree with Plan of Care Patient             Patient will benefit from skilled therapeutic intervention in order to improve the following deficits and impairments:   Body Structure / Function / Physical Skills: ADL, Decreased knowledge of use of DME, Strength, Balance, Dexterity, GMC, Pain, Tone, Body mechanics, Proprioception, UE functional use, Cardiopulmonary status limiting activity, Endurance, IADL, ROM, Fascial restriction, Improper spinal/pelvic alignment, Coordination, Flexibility, Mobility, Sensation, Wound, FMC, Muscle spasms, Skin integrity        Visit Diagnosis: Other abnormalities of gait and mobility  Muscle weakness (generalized)  Unsteadiness on feet  Other lack of coordination  Other disturbances of skin sensation  Stiffness of left hand, not elsewhere classified  Stiffness of right hand, not elsewhere classified    Problem List Patient Active Problem List   Diagnosis Date Noted   Acute on chronic respiratory failure with hypoxia (HCC)    Acute inflammatory demyelinating polyneuropathy ([place])    Dysautonomia ([place])    Atrial fibrillation with RVR [facility])    Healthcare associated bacterial pneumonia     Zachery Conch, OT 07/16/2021, 3:53 PM  [facility] [address] [place], [place], [address] Phone: [phone]   Fax:  [phone]  Name: Brian Espinoza MRN: [id number] Date of Birth: 01/11/1956

## 2021-07-16 NOTE — Therapy (Signed)
[facility] [address] [place], [place], [address] Phone: [phone]   Fax:  [phone]  Physical Therapy Treatment  Patient Details  Name: Brian Espinoza MRN: [id number] Date of Birth: [birth year] Referring Provider (PT): Leandro Reasoner, MD   Encounter Date: 07/16/2021   PT End of Session - 07/16/21 1426     Visit Number 36    Number of Visits 46    Date for PT Re-Evaluation 08/16/21    Authorization Type Medicare A&B; BCBS - 10th visit PN    Authorization Time Period KX MODIFIER REQUIRED NOW    Progress Note Due on Visit 53    PT Start Time 1405    PT Stop Time 1445    PT Time Calculation (min) 40 min    Equipment Utilized During Treatment Gait belt    Activity Tolerance Patient tolerated treatment well    Behavior During Therapy WFL for tasks assessed/performed             Past Medical History:  Diagnosis Date   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 No pertinent surgical history.  Vitals:   07/16/21 1420  BP: 138/66  Pulse: 60     Subjective Assessment - 07/16/21 1411     Subjective This weekend was good.  He would like to focus more on endurance rather than increase resistance because it made his bp go up too much.    Patient is accompained by: Family member    Pertinent History Chest pain, dissection of descending thoracic aorta, aneurysm of artery, a-fib, HTN, systolic CHF, oral phase dysphagia, sacral wound, PNA    Patient Stated Goals Improve hand function and walk to be more independent with daily activities.  Gain strength in legs to be able to lift hips and reduce shear on sacrum.    Currently in Pain? No/denies                               [facility] Adult PT  Treatment/Exercise - 07/16/21 1417       Bed Mobility   Bed Mobility Rolling Left    Rolling Left Supervision/Verbal cueing    Supine to Sit Moderate Assistance - Patient 50-74%    Sit to Supine Moderate Assistance - Patient 50-74%   LE management onto mat     Transfers   Transfers Stand Pivot Transfers;Squat Pivot Transfers    Sit to Stand 3: Mod assist   second therapist for safety   Sit to Stand Details (indicate cue type and reason) From w/c and mat surfaces    Stand to Sit 3: Mod assist   second therapist for safety   Stand Pivot Transfers 1: +2 Total assist    Stand Pivot Transfer Details (indicate cue type and reason) Task practice of stand pivot transfer w/c <> EOM elevated to 27 inches with 7" depth between mat and w/c seat to facilitate transer <> vehicle.  One therapist supported pt in front and provided cues for sequencing of pivoting feet without lifting from floor.  Second therapist behind for safety and to facilitate weight shifting and trunk position to prevent posterior LOB    Squat Pivot Transfers 4: Min assist;3: Mod assist    Squat  Pivot Transfer Details (indicate cue type and reason) Pt performs scoot to edge of wc w/ cuing for forward lean over opposite knee to facilitate LE placement.  MinA - ModA to keep weight forwards over BOS for transfer to mat w/ additional scoot to center and position feet.    Comments Performed lateral scooting in supine to R and L across mat with stepping out followed by bridge. When advancing each LE to L or R therapist provided manual resistance to hip flexion, ABD or ADD, and returning foot to mat to improve grading of extremity movement                     PT Education - 07/16/21 1421     Education Details Discussed progression of bed mobility at home w/ edu on goals to dec utilization of bed features and rise from supine to sit as able.  Review of techniques to promote independence with bed mobility and transfers.    Person(s)  Educated Patient    Methods Explanation;Tactile cues    Comprehension Verbalized understanding              PT Short Term Goals - 05/18/21 1324       PT SHORT TERM GOAL #1   Title = LTG for 30 days 1/22, 60 days 2/21, 90 days 3/23               PT Long Term Goals - 07/16/21 1428       PT LONG TERM GOAL #1   Title Pt and wife will demonstrate independence with progressed HEP (LTG set at 30 days - reset to 60 days 2/21 and 90 days 3/23)    Baseline 06/15/21 PT continues to update. Pt has been performing current one.  07/16/21 Pt continues to perform HEP w/ updates as necessary.    Time 4    Period Weeks    Status On-going    Target Date 07/17/21      PT LONG TERM GOAL #2   Title Pt will demonstrate ability to perform supine <> sit on flat mat with min A    Baseline mod-max A. 06/15/21 min assist with sit to supine at feet, max assist with supine to sit; 07/16/21 Pt performs si<>supine w/ ModA    Time 4    Period Weeks    Status On-going    Target Date 07/17/21      PT LONG TERM GOAL #3   Title Pt will perform squat-scooting transfers w/c <> mat consistently to L and R on level surface with mod A    Baseline mod A squat pivots    Time 4    Period Weeks    Status Achieved    Target Date 07/17/21      PT LONG TERM GOAL #4   Title Pt will be able to perform sit to/from stand mod assist of 1 person.    Baseline 07/16/21 pt is Mod A for sit <>stand with second person for safety    Time 4    Period Weeks    Status Achieved      PT LONG TERM GOAL #5   Title Pt will be able to maintain standing >5 min mod assist of 1 person with appropriate braces on legs for improved balance and functional strength.    Baseline 07/16/21: still requires multiple therapist assistance to maintain standing >5 minutes    Time 4    Period Weeks  Status Not Met    Target Date 07/17/21            Goals updated for another 30 days based on progress:   PT Long Term Goals - 07/16/21  1654       PT LONG TERM GOAL #1   Title Pt and wife will demonstrate independence with progressed HEP (LTG set at 30 days - reset to 60 days 2/21 and 90 days 3/23)    Baseline 07/16/21 Pt continues to perform HEP w/ updates as necessary.    Time 4    Period Weeks    Status Revised    Target Date 08/16/21      PT LONG TERM GOAL #2   Title Pt will demonstrate ability to perform supine <> sit on flat mat with min A    Baseline 07/16/21 Pt performs si<>supine w/ ModA    Time 4    Period Weeks    Status Revised    Target Date 08/16/21      PT LONG TERM GOAL #3   Title Pt will perform squat-scooting transfers w/c <> mat consistently to L and R on level surface with min A    Baseline mod A squat pivots    Time 4    Period Weeks    Status Revised    Target Date 08/16/21      PT LONG TERM GOAL #4   Title Pt will be able to perform sit to/from stand min assist of 1 person.    Baseline 07/16/21 pt is Mod A for sit <>stand with second person for safety    Time 4    Period Weeks    Status Revised    Target Date 08/16/21      PT LONG TERM GOAL #5   Title Pt will be able to maintain standing >5 min mod assist of 1 person with appropriate braces on legs for improved balance and functional strength.    Baseline 07/16/21: still requires multiple therapist assistance to maintain standing >5 minutes    Time 4    Period Weeks    Status Revised    Target Date 08/16/21                  Plan - 07/16/21 1642     Clinical Impression Statement Pt progressed to transfer training to facilitate safe entrance and exit from home vehicle using power w/c w/ 27" height and 7" depth.  Pt continues to have difficulty with unlocking knees to promote safe pivot transfer, but demonstrates some improved placement of left heel during transfers to left towards elevated mat.  Pt modA for most transfers w/ need for second person assist for safety with fatigue and prolonged activity.    Comorbidities Chest  pain, dissection of descending thoracic aorta, aneurysm of artery, a-fib, HTN, systolic CHF, oral phase dysphagia, sacral wound, PNA    Rehab Potential Good    PT Frequency 3x / week    PT Duration 12 weeks    PT Treatment/Interventions ADLs/Self Care Home Management;DME Instruction;Gait training;Stair training;Functional mobility training;Therapeutic activities;Therapeutic exercise;Balance training;Neuromuscular re-education;Wheelchair mobility training;Orthotic Fit/Training;Patient/family education;Energy conservation    PT Next Visit Plan May want to go on and schedule out through June so this does not happen again. Continue to work on [place] transfers to higher surfaces to eventually perform car transfer. Need 27" height with 7" depth for car.  SciFit for 8 minutes at 2.7resistance, go up on time NOT resistance.  Continue with  use of elevated mat with 14" step and padding to allow WB through forearms with weight shifting trying to keep soft knees if have 2nd person.  If +2 can also go into // bars and have him work on sit > stand - dysom on bars for grip, hand over hand to maintain hand position - may need to wear AFO and knee cage to prevent knee from locking into extension.  Left anterior [place] AFO and right anterior ottobock that has medial post was most comfortable for standing.  Continue to work on grading movements with sit <> supine and rolling.  MONITOR BP: Keep SBP between 120-130!    Consulted and Agree with Plan of Care Patient             Patient will benefit from skilled therapeutic intervention in order to improve the following deficits and impairments:  Cardiopulmonary status limiting activity, Decreased activity tolerance, Decreased balance, Decreased coordination, Decreased endurance, Decreased mobility, Decreased strength, Difficulty walking, Impaired sensation, Impaired tone, Impaired UE functional use, Postural dysfunction, Pain, Abnormal gait  Visit Diagnosis: Other  abnormalities of gait and mobility  Muscle weakness (generalized)  Unsteadiness on feet  Other lack of coordination     Problem List Patient Active Problem List   Diagnosis Date Noted   Acute on chronic respiratory failure with hypoxia (HCC)    Acute inflammatory demyelinating polyneuropathy (HCC)    Dysautonomia (HCC)    Atrial fibrillation with RVR ([place])    Healthcare associated bacterial pneumonia     Rico Junker, PT, DPT 07/16/21    4:53 PM    [facility] [address] [place], [place], [address] Phone: [phone]   Fax:  [phone]  Name: Brodrick Curran MRN: [id number] Date of Birth: [birth year]

## 2021-07-18 ENCOUNTER — Encounter: Payer: Self-pay | Admitting: Occupational Therapy

## 2021-07-18 ENCOUNTER — Ambulatory Visit: Payer: Medicare Other

## 2021-07-18 ENCOUNTER — Other Ambulatory Visit: Payer: Self-pay

## 2021-07-18 ENCOUNTER — Ambulatory Visit: Payer: Medicare Other | Admitting: Occupational Therapy

## 2021-07-18 VITALS — BP 110/68

## 2021-07-18 DIAGNOSIS — R2681 Unsteadiness on feet: Secondary | ICD-10-CM

## 2021-07-18 DIAGNOSIS — R2689 Other abnormalities of gait and mobility: Secondary | ICD-10-CM

## 2021-07-18 DIAGNOSIS — M25641 Stiffness of right hand, not elsewhere classified: Secondary | ICD-10-CM

## 2021-07-18 DIAGNOSIS — M6281 Muscle weakness (generalized): Secondary | ICD-10-CM | POA: Diagnosis not present

## 2021-07-18 DIAGNOSIS — R293 Abnormal posture: Secondary | ICD-10-CM

## 2021-07-18 DIAGNOSIS — R208 Other disturbances of skin sensation: Secondary | ICD-10-CM

## 2021-07-18 DIAGNOSIS — M25642 Stiffness of left hand, not elsewhere classified: Secondary | ICD-10-CM

## 2021-07-18 DIAGNOSIS — R278 Other lack of coordination: Secondary | ICD-10-CM

## 2021-07-18 NOTE — Therapy (Signed)
[facility] [address] [place], [place], [address] Phone: [phone]   Fax:  [phone]  Physical Therapy Treatment  Patient Details  Name: Brian Espinoza MRN: [id number] Date of Birth: [birth year] Referring Provider (PT): Alison Murray, MD   Encounter Date: 07/18/2021   PT End of Session - 07/18/21 1317     Visit Number 37    Number of Visits 52    Date for PT Re-Evaluation 08/16/21    Authorization Type Medicare A&B; BCBS - 10th visit PN    Authorization Time Period KX MODIFIER REQUIRED NOW    Progress Note Due on Visit 40    PT Start Time 1315    PT Stop Time 1400    PT Time Calculation (min) 45 min    Equipment Utilized During Treatment Gait belt    Activity Tolerance Patient tolerated treatment well    Behavior During Therapy WFL for tasks assessed/performed             Past Medical History:  Diagnosis Date   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 No pertinent surgical history.  Vitals:   07/18/21 1321  BP: 110/68     Subjective Assessment - 07/18/21 1317     Subjective Pt reports that his left hip and thigh have been really hurting especially first thing in morning if tries to bend up in bed.    Patient is accompained by: Family member    Pertinent History Chest pain, dissection of descending thoracic aorta, aneurysm of artery, a-fib, HTN, systolic CHF, oral phase dysphagia, sacral wound, PNA    Patient Stated Goals Improve hand function and walk to be more independent with daily activities.  Gain strength in legs to be able to lift hips and reduce shear on sacrum.    Currently in Pain? No/denies                               [facility] Adult PT Treatment/Exercise - 07/18/21 1318        Transfers   Transfers Sit to Stand;Stand to Sit    Sit to Stand 4: Min assist    Sit to Stand Details (indicate cue type and reason) +2 on each side for safety with knees blocked and cues to lean forward. Prior to transfer worked on scooting forwards to front of chair with PT stabilizing feet on floor and cues to lean to each side and slide contralateral hip foward min/mod assist.    Stand to Sit 4: Min assist    Stand to Sit Details (indicate cue type and reason) Verbal cues for technique    Stand to Sit Details verbal cues to flex at trunk and control knee flexion with descent. Min assist of +2 with PTs blocking knees for safety.    Comments BP=138/62 after first bout of standing. Came back down to 122/62 after rest.      Neuro Re-ed    Neuro Re-ed Details  Standing in front of elevated mat with 14" box and pad on top with support through forearms and w/c behind: stood x 5 min working on weight shifting side to side with focus on unlocking contralateral knee with each weight shift and then mini-squats x 4  reps. PT on each side to block knees for safety and also providing verbal and tactile cues to unlock knees and try to flex at trunk slightly to break up extension. Repeated for a second bout of the same.      Exercises   Exercises Other Exercises      Knee/Hip Exercises: Aerobic   Other Aerobic Pt performed with BLE and BUE with active hands utilized to keep hands in place and belt around LE to maintain neutral hip alignment. PT stabilized w/c to prevent tipping. Verbal cues to push with one leg at a time to get started to help dissociate movements.SciFit x 8 min level 2.8. BP=142/70 at end.                       PT Short Term Goals - 05/18/21 1324       PT SHORT TERM GOAL #1   Title = LTG for 30 days 1/22, 60 days 2/21, 90 days 3/23               PT Long Term Goals - 07/16/21 1654       PT LONG TERM GOAL #1   Title Pt and wife will demonstrate  independence with progressed HEP (LTG set at 30 days - reset to 60 days 2/21 and 90 days 3/23)    Baseline 07/16/21 Pt continues to perform HEP w/ updates as necessary.    Time 4    Period Weeks    Status Revised    Target Date 08/16/21      PT LONG TERM GOAL #2   Title Pt will demonstrate ability to perform supine <> sit on flat mat with min A    Baseline 07/16/21 Pt performs si<>supine w/ ModA    Time 4    Period Weeks    Status Revised    Target Date 08/16/21      PT LONG TERM GOAL #3   Title Pt will perform squat-scooting transfers w/c <> mat consistently to L and R on level surface with min A    Baseline mod A squat pivots    Time 4    Period Weeks    Status Revised    Target Date 08/16/21      PT LONG TERM GOAL #4   Title Pt will be able to perform sit to/from stand min assist of 1 person.    Baseline 07/16/21 pt is Mod A for sit <>stand with second person for safety    Time 4    Period Weeks    Status Revised    Target Date 08/16/21      PT LONG TERM GOAL #5   Title Pt will be able to maintain standing >5 min mod assist of 1 person with appropriate braces on legs for improved balance and functional strength.    Baseline 07/16/21: still requires multiple therapist assistance to maintain standing >5 minutes    Time 4    Period Weeks    Status Revised    Target Date 08/16/21                   Plan - 07/18/21 1409     Clinical Impression Statement Pt was able to demonstrate more control with activating some knee flexion with weight shifting in standing today. Continued to focus on grading movements. Pt denied any pain in legs during session today.    Comorbidities Chest pain, dissection of descending thoracic aorta, aneurysm  of artery, a-fib, HTN, systolic CHF, oral phase dysphagia, sacral wound, PNA    Rehab Potential Good    PT Frequency 3x / week    PT Duration 12 weeks    PT Treatment/Interventions ADLs/Self Care Home Management;DME Instruction;Gait  training;Stair training;Functional mobility training;Therapeutic activities;Therapeutic exercise;Balance training;Neuromuscular re-education;Wheelchair mobility training;Orthotic Fit/Training;Patient/family education;Energy conservation    PT Next Visit Plan Continue to work on squat/pivot transfers to higher surfaces to eventually perform car transfer. Need 27" height with 7" depth for car.  SciFit for 8 minutes at 2.7resistance, go up on time NOT resistance.  Continue with use of elevated mat with 14" step and padding to allow WB through forearms with weight shifting trying to keep soft knees if have 2nd person.  If +2 can also go into // bars and have him work on sit > stand - dysom on bars for grip, hand over hand to maintain hand position - may need to wear AFO and knee cage to prevent knee from locking into extension.  Left anterior [place] AFO and right anterior ottobock that has medial post was most comfortable for standing.  Continue to work on grading movements with sit <> supine and rolling.  MONITOR BP: Keep SBP between 120-130!    Consulted and Agree with Plan of Care Patient             Patient will benefit from skilled therapeutic intervention in order to improve the following deficits and impairments:  Cardiopulmonary status limiting activity, Decreased activity tolerance, Decreased balance, Decreased coordination, Decreased endurance, Decreased mobility, Decreased strength, Difficulty walking, Impaired sensation, Impaired tone, Impaired UE functional use, Postural dysfunction, Pain, Abnormal gait  Visit Diagnosis: Other abnormalities of gait and mobility  Muscle weakness (generalized)  Unsteadiness on feet     Problem List Patient Active Problem List   Diagnosis Date Noted   Acute on chronic respiratory failure with hypoxia (HCC)    Acute inflammatory demyelinating polyneuropathy (HCC)    Dysautonomia (HCC)    Atrial fibrillation with RVR (HCC)    Healthcare associated  bacterial pneumonia     Ronn Melena, PT, DPT, NCS 07/18/2021, 2:13 PM  [facility] [address] [place], [place], [address] Phone: [phone]   Fax:  [phone]  Name: Brian Espinoza MRN: [id number] Date of Birth: May 05, 1956

## 2021-07-18 NOTE — Therapy (Signed)
[facility] [address] [place], [place], [address] Phone: [phone]   Fax:  [phone]  Occupational Therapy Treatment  Patient Details  Name: Kalep Full MRN: [id number] Date of Birth: 05/11/1956 Referring Provider (OT): Aundra Dubin (Will send certification to PCP [place])   Encounter Date: 07/18/2021   OT End of Session - 07/18/21 1608     Visit Number 34    Number of Visits 55    Date for OT Re-Evaluation 09/18/21    Authorization Type Medicare and Federal BCBS    Progress Note Due on Visit 40    OT Start Time 1400    OT Stop Time 1445    OT Time Calculation (min) 45 min    Equipment Utilized During Treatment stylus    Activity Tolerance Patient tolerated treatment well    Behavior During Therapy WFL for tasks assessed/performed             Past Medical History:  Diagnosis Date   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 No pertinent surgical history.  There were no vitals filed for this visit.   Subjective Assessment - 07/18/21 1602     Subjective  Patient reports he can eat goldfish crackers, and girl scout cookies with his right hand.  He has not gotten into his regular bed.    Pain Score 0-No pain                          OT Treatments/Exercises (OP) - 07/18/21 1603       ADLs   Eating Patient able to hold cup and with right hand/ support with left, and bring to mouth to sip water without a straw.    Functional Mobility Worked on functional scooting to get to edge of bed.  Also worked on weight shift through lower trunk to transition from sidelying to sitting.    Writing Worked with adaptive stylus to type a full sentence on laptop.  Needs  further practice - and adaptation.      Neurological Re-education Exercises   Other Exercises 1 Working on bed mobility with ultimate goal of getting back into his bed.  Patient able to roll from supine left and right.  Patient able to roll into prone position and accept weight onto forearms.  Worked on activation of scapula stabilizers in prone on elbows.  With assistance - transitioned to quadruped.  Patient able to tolerate weight through BUE - over flexed fingers.  No report of wrist pain - assistance needed to ensure knees and hips remianed in flexion.  Patient able to shift weight slowly forward and backward in closed chain condition and mod assist.                      OT Short Term Goals - 07/18/21 1609       OT SHORT TERM GOAL #1   Title Patient and caregiver will complete a home exercise program designed to improve strength in proximal UE's - shoulders and elbows    Time 4    Period Weeks    Status Achieved   issued AROM exercises and gentle theraband tricep strengthening 04/30/21   Target Date 04/21/21  OT SHORT TERM GOAL #2   Title Following set up patient will hold a cup with lid and long straw in midline to allow him to take a sip on his own    Time 4    Period Weeks    Status Achieved   pt is using [place] with set up assistance. 07/09/20     OT SHORT TERM GOAL #3   Title While seated in upright supported position, patient will reach forward to make contact with upright target on table top (chest height) x 5 each arm    Time 4    Period Weeks    Status Achieved      OT SHORT TERM GOAL #4   Title Patient will be able to reach toward face with either right or left hand in preparation to scratch chin/cheek    Time 4    Period Weeks    Status Achieved      OT SHORT TERM GOAL #5   Title Patient will utilize cylindrical grip to hold small water bottle in one hand.    Time 4    Period Weeks    Status Partially Met   demonstrated holding bottle in RUE  however not consistently 04/30/21              OT Long Term Goals - 07/18/21 1610       OT LONG TERM GOAL #1   Title Patient will complete an updated stretching program to prevent contracture in digits    Time 12    Period Weeks    Status Achieved      OT LONG TERM GOAL #2   Title Patient and caregiver will demonstrate awareness of splint wear and care for BUE    Time 12    Period Weeks    Status Achieved   continue to modifiy and address PRN     OT LONG TERM GOAL #3   Title Patient will wash his face and upper body and upper legs with min assist    Time 12    Period Weeks    Status On-going   continuing to progress towards this goal     OT LONG TERM GOAL #4   Title Patient will actively bridge in bed to assist with pericare as needed    Time 12    Period Weeks    Status Partially Met   continuing to progress towards goal     OT LONG TERM GOAL #5   Title Patient will transfer to adapted commode with max assist in prep for toileting    Time 12    Period Weeks    Status On-going   working on troubleshooting [facility] and transfers for continuing to progess towards this goal     OT LONG TERM GOAL #6   Title Patient will transfer into/ out of shower with mod assist, and min assist for seated control during shower    Time 12    Period Weeks    Status On-going   much improvement with control and working towards goal     OT LONG TERM GOAL #7   Title Patient will feed himself 30% meal using utensils after set up    Time 12    Period Weeks    Status On-going    Target Date 09/18/21      OT LONG TERM GOAL #8   Title Patient will assist with pulling pants over hips (up/down) to aide with toileting  Time 12    Period Weeks    Status On-going    Target Date 09/18/21                   Plan - 07/18/21 1608     Clinical Impression Statement Pt showing steady improvement with postural control, coordination of movement of body segments, and upper body strength.   Patient improving in functional use of UE's    OT Occupational Profile and History Comprehensive Assessment- Review of records and extensive additional review of physical, cognitive, psychosocial history related to current functional performance    Occupational performance deficits (Please refer to evaluation for details): ADL's;IADL's;Rest and Sleep;Leisure    Body Structure / Function / Physical Skills ADL;Decreased knowledge of use of DME;Strength;Balance;Dexterity;GMC;Pain;Tone;Body mechanics;Proprioception;UE functional use;Cardiopulmonary status limiting activity;Endurance;IADL;ROM;Fascial restriction;Improper spinal/pelvic alignment;Coordination;Flexibility;Mobility;Sensation;Wound;FMC;Muscle spasms;Skin integrity    Rehab Potential Good    Clinical Decision Making Multiple treatment options, significant modification of task necessary    Comorbidities Affecting Occupational Performance: Presence of comorbidities impacting occupational performance    Comorbidities impacting occupational performance description: AIDP, Chronic back pain, Descending thoracic aortic anneurysm    Modification or Assistance to Complete Evaluation  Min-Moderate modification of tasks or assist with assess necessary to complete eval    OT Frequency 3x / week    OT Duration 12 weeks    OT Treatment/Interventions Self-care/ADL training;Moist Heat;Fluidtherapy;DME and/or AE instruction;Splinting;Balance training;Aquatic Therapy;Contrast Bath;Therapeutic activities;Ultrasound;Therapeutic exercise;Passive range of motion;Functional Mobility Training;Neuromuscular education;Electrical Stimulation;Manual Therapy;Patient/family education;Energy conservation    Plan equalizer with active hands, arm bike with aactive hands, grasp and release and coordination with functioanl tasks with BUE    Consulted and Agree with Plan of Care Patient             Patient will benefit from skilled therapeutic intervention in order to  improve the following deficits and impairments:   Body Structure / Function / Physical Skills: ADL, Decreased knowledge of use of DME, Strength, Balance, Dexterity, GMC, Pain, Tone, Body mechanics, Proprioception, UE functional use, Cardiopulmonary status limiting activity, Endurance, IADL, ROM, Fascial restriction, Improper spinal/pelvic alignment, Coordination, Flexibility, Mobility, Sensation, Wound, FMC, Muscle spasms, Skin integrity       Visit Diagnosis: Muscle weakness (generalized)  Unsteadiness on feet  Other lack of coordination  Other disturbances of skin sensation  Stiffness of left hand, not elsewhere classified  Stiffness of right hand, not elsewhere classified  Abnormal posture    Problem List Patient Active Problem List   Diagnosis Date Noted   Acute on chronic respiratory failure with hypoxia (HCC)    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 [place], [place], [address] Phone: [phone]   Fax:  [phone]  Name: Travor Royce MRN: [id number] Date of Birth: 1955-09-01

## 2021-07-20 ENCOUNTER — Encounter: Payer: Self-pay | Admitting: Occupational Therapy

## 2021-07-20 ENCOUNTER — Other Ambulatory Visit: Payer: Self-pay

## 2021-07-20 ENCOUNTER — Ambulatory Visit: Payer: Medicare Other | Admitting: Occupational Therapy

## 2021-07-20 ENCOUNTER — Ambulatory Visit: Payer: Medicare Other

## 2021-07-20 DIAGNOSIS — M6281 Muscle weakness (generalized): Secondary | ICD-10-CM | POA: Diagnosis not present

## 2021-07-20 DIAGNOSIS — M25642 Stiffness of left hand, not elsewhere classified: Secondary | ICD-10-CM

## 2021-07-20 DIAGNOSIS — R2681 Unsteadiness on feet: Secondary | ICD-10-CM

## 2021-07-20 DIAGNOSIS — R208 Other disturbances of skin sensation: Secondary | ICD-10-CM

## 2021-07-20 DIAGNOSIS — M25641 Stiffness of right hand, not elsewhere classified: Secondary | ICD-10-CM

## 2021-07-20 DIAGNOSIS — R278 Other lack of coordination: Secondary | ICD-10-CM

## 2021-07-20 DIAGNOSIS — R2689 Other abnormalities of gait and mobility: Secondary | ICD-10-CM

## 2021-07-20 NOTE — Therapy (Signed)
[facility] [address] [place], [place], [address] Phone: [phone]   Fax:  [phone]  Physical Therapy Treatment  Patient Details  Name: Brian Espinoza MRN: [id number] Date of Birth: 1955-08-19 Referring Provider (PT): Alison Murray, MD   Encounter Date: 07/20/2021   PT End of Session - 07/20/21 1401     Visit Number 38    Number of Visits 52    Date for PT Re-Evaluation 08/16/21    Authorization Type Medicare A&B; BCBS - 10th visit PN    Authorization Time Period KX MODIFIER REQUIRED NOW    Progress Note Due on Visit 40    PT Start Time 1319    PT Stop Time 1400    PT Time Calculation (min) 41 min    Equipment Utilized During Treatment Gait belt    Activity Tolerance Patient tolerated treatment well    Behavior During Therapy WFL for tasks assessed/performed             Past Medical History:  Diagnosis Date   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 No pertinent surgical history.  There were no vitals filed for this visit.   Subjective Assessment - 07/20/21 1320     Subjective Nothing new, no changes and no pain. Pt reports that bed is 27" high at home. Going to try the transfer with wife and daughter this weekend.    Patient is accompained by: Family member    Pertinent History Chest pain, dissection of descending thoracic aorta, aneurysm of artery, a-fib, HTN, systolic CHF, oral phase dysphagia, sacral wound, PNA    Patient Stated Goals Improve hand function and walk to be more independent with daily activities.  Gain strength in legs to be able to lift hips and reduce shear on sacrum.    Currently in Pain? No/denies    Pain Onset More than a month ago                                [facility] Adult PT Treatment/Exercise - 07/20/21 1401       Transfers   Transfers Sit to Stand;Stand to Sit    Sit to Stand 4: Min assist;4: Min guard    Sit to Stand Details Verbal cues for technique    Sit to Stand Details (indicate cue type and reason) Performed in // bars with hands on bar and PT in front for safety with 2 other therapists on each side. Only provided CGA/min assist with verbal cues to lean forward to come to rise. Performed 2 times during standing activities then 4 reps consecutively having pt perform almost all the movement.    Stand to Sit 4: Min guard;4: Min assist    Stand to Sit Details (indicate cue type and reason) Verbal cues for technique    Stand to Sit Details Pt was cued to bow forward to and start with slight bend in knees to go to sit. Performed 4 consecutive reps. Improved control with bow forward.      Ambulation/Gait   Pre-Gait Activities In // bars with hands on non slip surface and therapist on each side to help with hand and trunk control with 3rd therapist in front to block knees/facilitate  movements: 2 standing trials of about 5 minutes each. Worked first on standing trying to get trunk over legs as was initially leaning posterior. Improved on second bout after cuing and facilitation. Weight shifting side to side with working on unlocking contralateral knee with weight shift x 5 each standing bout. Then trialed stepping foot forwards and backwards x 3 each side with PT in front assisting with foot placement each time. Cues to unlock knee of leg he was stepping with prior to moving leg. Pt had bilateral anterior AFOs donned with [place] on left and [place] on right that had medial post. BP=116/62 after standing.                       PT Short Term Goals - 05/18/21 1324       PT SHORT TERM GOAL #1   Title = LTG for 30 days 1/22, 60 days 2/21, 90 days 3/23               PT Long Term Goals -  07/16/21 1654       PT LONG TERM GOAL #1   Title Pt and wife will demonstrate independence with progressed HEP (LTG set at 30 days - reset to 60 days 2/21 and 90 days 3/23)    Baseline 07/16/21 Pt continues to perform HEP w/ updates as necessary.    Time 4    Period Weeks    Status Revised    Target Date 08/16/21      PT LONG TERM GOAL #2   Title Pt will demonstrate ability to perform supine <> sit on flat mat with min A    Baseline 07/16/21 Pt performs si<>supine w/ ModA    Time 4    Period Weeks    Status Revised    Target Date 08/16/21      PT LONG TERM GOAL #3   Title Pt will perform squat-scooting transfers w/c <> mat consistently to L and R on level surface with min A    Baseline mod A squat pivots    Time 4    Period Weeks    Status Revised    Target Date 08/16/21      PT LONG TERM GOAL #4   Title Pt will be able to perform sit to/from stand min assist of 1 person.    Baseline 07/16/21 pt is Mod A for sit <>stand with second person for safety    Time 4    Period Weeks    Status Revised    Target Date 08/16/21      PT LONG TERM GOAL #5   Title Pt will be able to maintain standing >5 min mod assist of 1 person with appropriate braces on legs for improved balance and functional strength.    Baseline 07/16/21: still requires multiple therapist assistance to maintain standing >5 minutes    Time 4    Period Weeks    Status Revised    Target Date 08/16/21                   Plan - 07/20/21 1420     Clinical Impression Statement Continued to work on grading movements in standing in // bars with +3 assist. Pt required decreased assist with sit to stand when proper technique performed with more lean forward. He was able to initiate unlocking knees in standing better today but needs assistance with placement as legs try to adduct when raises. Also needs  cue to unlock knee before trying to move so leg does not just kick out.    Comorbidities Chest pain, dissection of  descending thoracic aorta, aneurysm of artery, a-fib, HTN, systolic CHF, oral phase dysphagia, sacral wound, PNA    Rehab Potential Good    PT Frequency 3x / week    PT Duration 12 weeks    PT Treatment/Interventions ADLs/Self Care Home Management;DME Instruction;Gait training;Stair training;Functional mobility training;Therapeutic activities;Therapeutic exercise;Balance training;Neuromuscular re-education;Wheelchair mobility training;Orthotic Fit/Training;Patient/family education;Energy conservation    PT Next Visit Plan Continue to work on squat/pivot transfers to higher surfaces to eventually perform car transfer. Need 27" height with 7" depth for car. Pt reported bed height was also 27".  SciFit for 8 minutes at 2.7resistance, go up on time NOT resistance.  Continue with use of elevated mat with 14" step and padding to allow WB through forearms with weight shifting trying to keep soft knees if have 2nd person.  If +2 can also go into // bars and have him work on sit > stand - dysom on bars for grip, hand over hand to maintain hand position - may need to wear AFO and knee cage to prevent knee from locking into extension.  Left anterior [place] AFO and right anterior [place] that has medial post was most comfortable for standing.  Continue to work on grading movements with sit <> supine and rolling.  MONITOR BP: Keep SBP between 120-130!    Consulted and Agree with Plan of Care Patient             Patient will benefit from skilled therapeutic intervention in order to improve the following deficits and impairments:  Cardiopulmonary status limiting activity, Decreased activity tolerance, Decreased balance, Decreased coordination, Decreased endurance, Decreased mobility, Decreased strength, Difficulty walking, Impaired sensation, Impaired tone, Impaired UE functional use, Postural dysfunction, Pain, Abnormal gait  Visit Diagnosis: Other abnormalities of gait and mobility  Muscle weakness  (generalized)  Unsteadiness on feet     Problem List Patient Active Problem List   Diagnosis Date Noted   Acute on chronic respiratory failure with hypoxia (HCC)    Acute inflammatory demyelinating polyneuropathy (HCC)    Dysautonomia (HCC)    Atrial fibrillation with RVR (HCC)    Healthcare associated bacterial pneumonia     Ronn Melena, PT, DPT, NCS 07/20/2021, 2:23 PM  [facility] [address] [place], [place], [address] Phone: [phone]   Fax:  [phone]  Name: Kylil Swopes MRN: [id number] Date of Birth: December 02, 1955

## 2021-07-20 NOTE — Therapy (Signed)
[facility] [address] [place], [place], [address] Phone: [phone]   Fax:  [phone]  Occupational Therapy Treatment  Patient Details  Name: Brian Espinoza MRN: [id number] Date of Birth: Oct 23, 1955 Referring Provider (OT): Aundra Dubin (Will send certification to PCP [place])   Encounter Date: 07/20/2021   OT End of Session - 07/20/21 1233     Visit Number 35    Number of Visits 61    Date for OT Re-Evaluation 09/18/21    Authorization Type Medicare and Federal BCBS    Progress Note Due on Visit 17    OT Start Time 1233    OT Stop Time 1315    OT Time Calculation (min) 42 min    Activity Tolerance Patient tolerated treatment well    Behavior During Therapy WFL for tasks assessed/performed             Past Medical History:  Diagnosis Date   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 No pertinent surgical history.  There were no vitals filed for this visit.   Subjective Assessment - 07/20/21 1233     Subjective  "nothing new"    Currently in Pain? No/denies    Pain Score 0-No pain                          OT Treatments/Exercises (OP) - 07/20/21 1253       Additional Elbow Exercises   UBE (Upper Arm Bike) x 6 minutes (forward and backward) level 1 with use of active hands on BUE d/t limited grasp      Neurological Re-education Exercises   Other Exercises 1 Universal Equalizer x 15 reps for vertical chest press, protraction/retraction (10#), lat pull down with 15# BUE with use of active hands BUE completed x 2 sets Min A for facilitation/assistance for control. Bicep Curls with 3# dumbbells BUE with use of active hands with min A for assistancec with  maintaining wrist positioning and safety.                      OT Short Term Goals - 07/18/21 1609       OT SHORT TERM GOAL #1   Title Patient and caregiver will complete a home exercise program designed to improve strength in proximal UE's - shoulders and elbows    Time 4    Period Weeks    Status Achieved   issued AROM exercises and gentle theraband tricep strengthening 04/30/21   Target Date 04/21/21      OT SHORT TERM GOAL #2   Title Following set up patient will hold a cup with lid and long straw in midline to allow him to take a sip on his own    Time 4    Period Weeks    Status Achieved   pt is using [employer] with set up assistance. 07/09/20     OT SHORT TERM GOAL #3   Title While seated in upright supported position, patient will reach forward to make contact with upright target on table top (chest height) x 5 each arm    Time 4    Period Weeks    Status Achieved  OT SHORT TERM GOAL #4   Title Patient will be able to reach toward face with either right or left hand in preparation to scratch chin/cheek    Time 4    Period Weeks    Status Achieved      OT SHORT TERM GOAL #5   Title Patient will utilize cylindrical grip to hold small water bottle in one hand.    Time 4    Period Weeks    Status Partially Met   demonstrated holding bottle in RUE however not consistently 04/30/21              OT Long Term Goals - 07/18/21 1610       OT LONG TERM GOAL #1   Title Patient will complete an updated stretching program to prevent contracture in digits    Time 12    Period Weeks    Status Achieved      OT LONG TERM GOAL #2   Title Patient and caregiver will demonstrate awareness of splint wear and care for BUE    Time 12    Period Weeks    Status Achieved   continue to modifiy and address PRN     OT LONG TERM GOAL #3   Title Patient will wash his face and upper body and upper legs with min assist    Time 12    Period Weeks    Status On-going    continuing to progress towards this goal     OT LONG TERM GOAL #4   Title Patient will actively bridge in bed to assist with pericare as needed    Time 12    Period Weeks    Status Partially Met   continuing to progress towards goal     OT LONG TERM GOAL #5   Title Patient will transfer to adapted commode with max assist in prep for toileting    Time 12    Period Weeks    Status On-going   working on troubleshooting [facility] and transfers for continuing to progess towards this goal     OT LONG TERM GOAL #6   Title Patient will transfer into/ out of shower with mod assist, and min assist for seated control during shower    Time 12    Period Weeks    Status On-going   much improvement with control and working towards goal     OT LONG TERM GOAL #7   Title Patient will feed himself 30% meal using utensils after set up    Time 12    Period Weeks    Status On-going    Target Date 09/18/21      OT LONG TERM GOAL #8   Title Patient will assist with pulling pants over hips (up/down) to aide with toileting    Time 12    Period Weeks    Status On-going    Target Date 09/18/21                   Plan - 07/20/21 1308     Clinical Impression Statement Pt with good BUE strengthening and control today with improvement noted with exercises.    OT Occupational Profile and History Comprehensive Assessment- Review of records and extensive additional review of physical, cognitive, psychosocial history related to current functional performance    Occupational performance deficits (Please refer to evaluation for details): ADL's;IADL's;Rest and Sleep;Leisure    Body Structure / Function / Physical Skills ADL;Decreased knowledge of  use of DME;Strength;Balance;Dexterity;GMC;Pain;Tone;Body mechanics;Proprioception;UE functional use;Cardiopulmonary status limiting activity;Endurance;IADL;ROM;Fascial restriction;Improper spinal/pelvic  alignment;Coordination;Flexibility;Mobility;Sensation;Wound;FMC;Muscle spasms;Skin integrity    Rehab Potential Good    Clinical Decision Making Multiple treatment options, significant modification of task necessary    Comorbidities Affecting Occupational Performance: Presence of comorbidities impacting occupational performance    Comorbidities impacting occupational performance description: AIDP, Chronic back pain, Descending thoracic aortic anneurysm    Modification or Assistance to Complete Evaluation  Min-Moderate modification of tasks or assist with assess necessary to complete eval    OT Frequency 3x / week    OT Duration 12 weeks    OT Treatment/Interventions Self-care/ADL training;Moist Heat;Fluidtherapy;DME and/or AE instruction;Splinting;Balance training;Aquatic Therapy;Contrast Bath;Therapeutic activities;Ultrasound;Therapeutic exercise;Passive range of motion;Functional Mobility Training;Neuromuscular education;Electrical Stimulation;Manual Therapy;Patient/family education;Energy conservation    Plan equalizer with active hands, arm bike with active hands, grasp and release and coordination with functional tasks with BUE    Consulted and Agree with Plan of Care Patient             Patient will benefit from skilled therapeutic intervention in order to improve the following deficits and impairments:   Body Structure / Function / Physical Skills: ADL, Decreased knowledge of use of DME, Strength, Balance, Dexterity, GMC, Pain, Tone, Body mechanics, Proprioception, UE functional use, Cardiopulmonary status limiting activity, Endurance, IADL, ROM, Fascial restriction, Improper spinal/pelvic alignment, Coordination, Flexibility, Mobility, Sensation, Wound, FMC, Muscle spasms, Skin integrity       Visit Diagnosis: Muscle weakness (generalized)  Unsteadiness on feet  Other lack of coordination  Other disturbances of skin sensation  Stiffness of left hand, not elsewhere  classified  Stiffness of right hand, not elsewhere classified    Problem List Patient Active Problem List   Diagnosis Date Noted   Acute on chronic respiratory failure with hypoxia (HCC)    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

## 2021-07-23 ENCOUNTER — Encounter: Payer: Self-pay | Admitting: Occupational Therapy

## 2021-07-23 ENCOUNTER — Other Ambulatory Visit: Payer: Self-pay

## 2021-07-23 ENCOUNTER — Ambulatory Visit: Payer: Medicare Other | Admitting: Occupational Therapy

## 2021-07-23 ENCOUNTER — Ambulatory Visit: Payer: Medicare Other | Admitting: Physical Therapy

## 2021-07-23 VITALS — BP 105/56 | HR 66

## 2021-07-23 DIAGNOSIS — R2689 Other abnormalities of gait and mobility: Secondary | ICD-10-CM

## 2021-07-23 DIAGNOSIS — R278 Other lack of coordination: Secondary | ICD-10-CM

## 2021-07-23 DIAGNOSIS — R2681 Unsteadiness on feet: Secondary | ICD-10-CM

## 2021-07-23 DIAGNOSIS — R208 Other disturbances of skin sensation: Secondary | ICD-10-CM

## 2021-07-23 DIAGNOSIS — M6281 Muscle weakness (generalized): Secondary | ICD-10-CM

## 2021-07-23 DIAGNOSIS — R295 Transient paralysis: Secondary | ICD-10-CM

## 2021-07-23 DIAGNOSIS — M25642 Stiffness of left hand, not elsewhere classified: Secondary | ICD-10-CM

## 2021-07-23 DIAGNOSIS — M25641 Stiffness of right hand, not elsewhere classified: Secondary | ICD-10-CM

## 2021-07-23 NOTE — Therapy (Signed)
[facility] [address] [place], [place], [address] Phone: [phone]   Fax:  [phone]  Occupational Therapy Treatment  Patient Details  Name: Brian Espinoza MRN: [id number] Date of Birth: [birth year] Referring Provider (OT): Aundra Dubin (Will send certification to PCP [place])   Encounter Date: 07/23/2021   OT End of Session - 07/23/21 1432     Visit Number 36    Number of Visits 50    Date for OT Re-Evaluation 09/18/21    Authorization Type Medicare and Federal BCBS    Progress Note Due on Visit 40    OT Start Time 1400    OT Stop Time 1445    OT Time Calculation (min) 45 min    Activity Tolerance Patient tolerated treatment well    Behavior During Therapy WFL for tasks assessed/performed             Past Medical History:  Diagnosis Date   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 No pertinent surgical history.  There were no vitals filed for this visit.   Subjective Assessment - 07/23/21 1431     Subjective  Pt reports nothing eventful over the weekend, denies pain and denies any changes.    Currently in Pain? No/denies    Pain Score 0-No pain                Transfer with min A to right to edge of mat  Theraband - yellow bicep curls, scap retraction, shoulder flexion x 10 reps   BUE reach and shoulder endurance. Pt with much improved sustained grasp and reaching with BUE and with decreased fatigue from previous sessions.                  OT Short Term Goals - 07/18/21 1609       OT SHORT TERM GOAL #1   Title Patient and caregiver will complete a home exercise program designed to improve strength in proximal UE's - shoulders and elbows    Time  4    Period Weeks    Status Achieved   issued AROM exercises and gentle theraband tricep strengthening 04/30/21   Target Date 04/21/21      OT SHORT TERM GOAL #2   Title Following set up patient will hold a cup with lid and long straw in midline to allow him to take a sip on his own    Time 4    Period Weeks    Status Achieved   pt is using [employer] with set up assistance. 07/09/20     OT SHORT TERM GOAL #3   Title While seated in upright supported position, patient will reach forward to make contact with upright target on table top (chest height) x 5 each arm    Time 4    Period Weeks    Status Achieved      OT SHORT TERM GOAL #4   Title Patient will be able to reach toward face with either right or left hand in preparation to scratch chin/cheek    Time 4    Period Weeks    Status Achieved      OT SHORT TERM GOAL #5   Title Patient will utilize cylindrical grip to  hold small water bottle in one hand.    Time 4    Period Weeks    Status Partially Met   demonstrated holding bottle in RUE however not consistently 04/30/21              OT Long Term Goals - 07/18/21 1610       OT LONG TERM GOAL #1   Title Patient will complete an updated stretching program to prevent contracture in digits    Time 12    Period Weeks    Status Achieved      OT LONG TERM GOAL #2   Title Patient and caregiver will demonstrate awareness of splint wear and care for BUE    Time 12    Period Weeks    Status Achieved   continue to modifiy and address PRN     OT LONG TERM GOAL #3   Title Patient will wash his face and upper body and upper legs with min assist    Time 12    Period Weeks    Status On-going   continuing to progress towards this goal     OT LONG TERM GOAL #4   Title Patient will actively bridge in bed to assist with pericare as needed    Time 12    Period Weeks    Status Partially Met   continuing to progress towards goal     OT LONG TERM GOAL #5   Title Patient will  transfer to adapted commode with max assist in prep for toileting    Time 12    Period Weeks    Status On-going   working on troubleshooting [facility] and transfers for continuing to progess towards this goal     OT LONG TERM GOAL #6   Title Patient will transfer into/ out of shower with mod assist, and min assist for seated control during shower    Time 12    Period Weeks    Status On-going   much improvement with control and working towards goal     OT LONG TERM GOAL #7   Title Patient will feed himself 30% meal using utensils after set up    Time 12    Period Weeks    Status On-going    Target Date 09/18/21      OT LONG TERM GOAL #8   Title Patient will assist with pulling pants over hips (up/down) to aide with toileting    Time 12    Period Weeks    Status On-going    Target Date 09/18/21                   Plan - 07/23/21 1432     Clinical Impression Statement Pt in good spirits today and excited about progress and motivated for continued progress towards increased independence with ADLs and IADLs.    OT Occupational Profile and History Comprehensive Assessment- Review of records and extensive additional review of physical, cognitive, psychosocial history related to current functional performance    Occupational performance deficits (Please refer to evaluation for details): ADL's;IADL's;Rest and Sleep;Leisure    Body Structure / Function / Physical Skills ADL;Decreased knowledge of use of DME;Strength;Balance;Dexterity;GMC;Pain;Tone;Body mechanics;Proprioception;UE functional use;Cardiopulmonary status limiting activity;Endurance;IADL;ROM;Fascial restriction;Improper spinal/pelvic alignment;Coordination;Flexibility;Mobility;Sensation;Wound;FMC;Muscle spasms;Skin integrity    Rehab Potential Good    Clinical Decision Making Multiple treatment options, significant modification of task necessary    Comorbidities Affecting Occupational Performance: Presence of comorbidities  impacting occupational performance    Comorbidities impacting  occupational performance description: AIDP, Chronic back pain, Descending thoracic aortic anneurysm    Modification or Assistance to Complete Evaluation  Min-Moderate modification of tasks or assist with assess necessary to complete eval    OT Frequency 3x / week    OT Duration 12 weeks    OT Treatment/Interventions Self-care/ADL training;Moist Heat;Fluidtherapy;DME and/or AE instruction;Splinting;Balance training;Aquatic Therapy;Contrast Bath;Therapeutic activities;Ultrasound;Therapeutic exercise;Passive range of motion;Functional Mobility Training;Neuromuscular education;Electrical Stimulation;Manual Therapy;Patient/family education;Energy conservation    Plan equalizer with active hands, arm bike with active hands, grasp and release and coordination with functional tasks with BUE    Consulted and Agree with Plan of Care Patient             Patient will benefit from skilled therapeutic intervention in order to improve the following deficits and impairments:   Body Structure / Function / Physical Skills: ADL, Decreased knowledge of use of DME, Strength, Balance, Dexterity, GMC, Pain, Tone, Body mechanics, Proprioception, UE functional use, Cardiopulmonary status limiting activity, Endurance, IADL, ROM, Fascial restriction, Improper spinal/pelvic alignment, Coordination, Flexibility, Mobility, Sensation, Wound, FMC, Muscle spasms, Skin integrity       Visit Diagnosis: Muscle weakness (generalized)  Unsteadiness on feet  Other lack of coordination  Other disturbances of skin sensation  Stiffness of left hand, not elsewhere classified  Stiffness of right hand, not elsewhere classified  Other abnormalities of gait and mobility    Problem List Patient Active Problem List   Diagnosis Date Noted   Acute on chronic respiratory failure with hypoxia (HCC)    Acute inflammatory demyelinating polyneuropathy (HCC)     Dysautonomia ([place])    Atrial fibrillation with RVR [facility])    Healthcare associated bacterial pneumonia     Zachery Conch, OT 07/23/2021, 4:48 PM  [facility] [address] [place], [place], [address] Phone: [phone]   Fax:  [phone]  Name: Brian Espinoza MRN: [id number] Date of Birth: Jul 02, 1955

## 2021-07-23 NOTE — Therapy (Signed)
[facility] [address] [place], [place], [address] Phone: [phone]   Fax:  [phone]  Physical Therapy Treatment  Patient Details  Name: Brian Espinoza MRN: [id number] Date of Birth: 02/21/1956 Referring Provider (PT): Alison Murray, MD   Encounter Date: 07/23/2021   PT End of Session - 07/23/21 1654     Visit Number 39    Number of Visits 52    Date for PT Re-Evaluation 08/16/21    Authorization Type Medicare A&B; BCBS - 10th visit PN    Authorization Time Period KX MODIFIER REQUIRED NOW    Progress Note Due on Visit 40    PT Start Time 1450    PT Stop Time 1530    PT Time Calculation (min) 40 min    Activity Tolerance Patient tolerated treatment well    Behavior During Therapy WFL for tasks assessed/performed             Past Medical History:  Diagnosis Date   Acute inflammatory demyelinating polyneuropathy (HCC)    Acute on chronic respiratory failure with hypoxia (HCC)    Atrial fibrillation with RVR (HCC)    Dysautonomia (HCC)    Healthcare associated bacterial pneumonia     No past surgical history on file.  Vitals:   07/23/21 1517  BP: (!) 105/56  Pulse: 66     Subjective Assessment - 07/23/21 1515     Subjective Didn't try the 27" bed transfer this weekend due to softness of bed.  Was able to transition supine > sit at home without assistance.    Patient is accompained by: Family member    Pertinent History Chest pain, dissection of descending thoracic aorta, aneurysm of artery, a-fib, HTN, systolic CHF, oral phase dysphagia, sacral wound, PNA    Patient Stated Goals Improve hand function and walk to be more independent with daily activities.  Gain strength in legs to be able to lift hips and reduce shear on sacrum.    Pain Onset More than a month ago             Patient received from OT already seated on mat.  Transitioned sit > sidelying on L elbow and then back to supine with  min A for LE management with pt demonstrating controlled lowering to sidelying.  Transitioned from supine > prone with supervision.    In prone performed shoulder and scapular strengthening with closed chain scapular protraction and depression with prone on elbows x 10 reps with 5 second hold and PT stabilizing R shoulder.  Moved hands out wide and added in elbow extension with scapular protraction and depression (cobra pose) x 5 reps with 6 second hold.  Transitioned from prone on elbows > Quadruped with one PT assisting with lifting of hips/pelvis into flexion while 2 other PTs assisted with positioning and stabilizing shoulders as pt walked elbows back until he was WB through bilat UE and LE; performed x 3-4 reps.  Attempted to transition weight back on LE to be able to extend from elbows > palms but unable.  Focused on slow, controlled transition back to prone by walking elbows forwards.  Transitioned to Supine to perform bilat LE strengthening and grading of movement with the following exercises:  -Pillow squeeze isometric adduction with bridge x 8 reps -Pillow squeeze isometric adduction with bilat hip flexion to lift knees to chest x10 reps with controlled lower of LE -Bilat Bridge with alternating heel lift x 5 reps each side; progressed to single  LE lift off mat with PT providing cues to maintain hips elevated x 3 reps each side.  Transitioned supine > sitting edge of mat with mod A due to shoulder fatigue; cues to drop pelvis towards mat and push through lower elbow with trunk forward rotation to bring trunk upright.  Pt able to scoot along mat using UE and LE with min A and verbal cues for head-hips relationship.  Transfer squat pivot mat > w/c mod A with patient initiating sit > squat and therapist maintaining squat during 1 pivot to chair.   PT Short Term Goals - 05/18/21 1324       PT SHORT TERM GOAL #1   Title = LTG for 30 days 1/22, 60 days 2/21, 90 days 3/23                PT Long Term Goals - 07/16/21 1654       PT LONG TERM GOAL #1   Title Pt and wife will demonstrate independence with progressed HEP (LTG set at 30 days - reset to 60 days 2/21 and 90 days 3/23)    Baseline 07/16/21 Pt continues to perform HEP w/ updates as necessary.    Time 4    Period Weeks    Status Revised    Target Date 08/16/21      PT LONG TERM GOAL #2   Title Pt will demonstrate ability to perform supine <> sit on flat mat with min A    Baseline 07/16/21 Pt performs si<>supine w/ ModA    Time 4    Period Weeks    Status Revised    Target Date 08/16/21      PT LONG TERM GOAL #3   Title Pt will perform squat-scooting transfers w/c <> mat consistently to L and R on level surface with min A    Baseline mod A squat pivots    Time 4    Period Weeks    Status Revised    Target Date 08/16/21      PT LONG TERM GOAL #4   Title Pt will be able to perform sit to/from stand min assist of 1 person.    Baseline 07/16/21 pt is Mod A for sit <>stand with second person for safety    Time 4    Period Weeks    Status Revised    Target Date 08/16/21      PT LONG TERM GOAL #5   Title Pt will be able to maintain standing >5 min mod assist of 1 person with appropriate braces on legs for improved balance and functional strength.    Baseline 07/16/21: still requires multiple therapist assistance to maintain standing >5 minutes    Time 4    Period Weeks    Status Revised    Target Date 08/16/21                   Plan - 07/23/21 2112     Clinical Impression Statement Utilized prone and supine today to continue to promote increased WB, proprioception, strength and grading of movement in UE and LE.  Pt able to rise into quadruped on elbows with +3 assistance and maintain with weight over LE to unweight shoulders but unable to push up into UE elbow extension.  Will continue to address and progress towards LTG.    Comorbidities Chest pain, dissection of descending thoracic aorta,  aneurysm of artery, a-fib, HTN, systolic CHF, oral phase dysphagia, sacral wound, PNA  Rehab Potential Good    PT Frequency 3x / week    PT Duration 12 weeks    PT Treatment/Interventions ADLs/Self Care Home Management;DME Instruction;Gait training;Stair training;Functional mobility training;Therapeutic activities;Therapeutic exercise;Balance training;Neuromuscular re-education;Wheelchair mobility training;Orthotic Fit/Training;Patient/family education;Energy conservation    PT Next Visit Plan 10th visit progress note; Continue to work on squat/pivot transfers to higher surfaces to eventually perform car transfer. Need 27" height with 7" depth for car. Pt reported bed height was also 27".  SciFit for 8 minutes at 2.7 resistance, go up on time NOT resistance.  Continue with use of elevated mat with 14" step and padding to allow WB through forearms with weight shifting trying to keep soft knees if have 2nd person.  If +2 can also go into // bars and have him work on sit > stand - dysom on bars for grip, hand over hand to maintain hand position - may need to wear AFO and knee cage to prevent knee from locking into extension.  Left anterior Thu[place] AFO and right anterior ottobock that has medial post was most comfortable for standing.  Continue to work on grading movements with sit <> supine, rolling, prone > quadruped.  MONITOR BP: Keep SBP between 120-130!    Consulted and Agree with Plan of Care Patient             Patient will benefit from skilled therapeutic intervention in order to improve the following deficits and impairments:  Cardiopulmonary status limiting activity, Decreased activity tolerance, Decreased balance, Decreased coordination, Decreased endurance, Decreased mobility, Decreased strength, Difficulty walking, Impaired sensation, Impaired tone, Impaired UE functional use, Postural dysfunction, Pain, Abnormal gait  Visit Diagnosis: Transient paralysis  Muscle weakness  (generalized)  Unsteadiness on feet  Other disturbances of skin sensation  Other abnormalities of gait and mobility     Problem List Patient Active Problem List   Diagnosis Date Noted   Acute on chronic respiratory failure with hypoxia (HCC)    Acute inflammatory demyelinating polyneuropathy (HCC)    Dysautonomia (HCC)    Atrial fibrillation with RVR (HCC)    Healthcare associated bacterial pneumonia     Dierdre Highman, PT, DPT 07/23/21    9:27 PM   [facility] [address] [place], [place], [address] Phone: [phone]   Fax:  [phone]  Name: Brian Espinoza MRN: [id number] Date of Birth: [birth year]

## 2021-07-25 ENCOUNTER — Ambulatory Visit: Payer: Medicare Other | Attending: Cardiovascular Disease | Admitting: Occupational Therapy

## 2021-07-25 ENCOUNTER — Ambulatory Visit: Payer: Medicare Other

## 2021-07-25 ENCOUNTER — Other Ambulatory Visit: Payer: Self-pay

## 2021-07-25 VITALS — BP 120/62

## 2021-07-25 DIAGNOSIS — M6281 Muscle weakness (generalized): Secondary | ICD-10-CM

## 2021-07-25 DIAGNOSIS — R293 Abnormal posture: Secondary | ICD-10-CM | POA: Diagnosis present

## 2021-07-25 DIAGNOSIS — R2689 Other abnormalities of gait and mobility: Secondary | ICD-10-CM | POA: Insufficient documentation

## 2021-07-25 DIAGNOSIS — R278 Other lack of coordination: Secondary | ICD-10-CM | POA: Insufficient documentation

## 2021-07-25 DIAGNOSIS — R2681 Unsteadiness on feet: Secondary | ICD-10-CM | POA: Diagnosis present

## 2021-07-25 DIAGNOSIS — R295 Transient paralysis: Secondary | ICD-10-CM | POA: Diagnosis present

## 2021-07-25 DIAGNOSIS — M25642 Stiffness of left hand, not elsewhere classified: Secondary | ICD-10-CM | POA: Diagnosis present

## 2021-07-25 DIAGNOSIS — R208 Other disturbances of skin sensation: Secondary | ICD-10-CM | POA: Diagnosis present

## 2021-07-25 DIAGNOSIS — M25641 Stiffness of right hand, not elsewhere classified: Secondary | ICD-10-CM | POA: Diagnosis not present

## 2021-07-25 NOTE — Therapy (Addendum)
?OUTPATIENT PHYSICAL THERAPY TREATMENT NOTE/ Progress note ? ? ?Patient Name: Brian Espinoza ?MRN: [id number] ?DOB:1956-01-13, 66 y.o., male ?Today's Date: 07/25/2021 ? ?  Progress Note ? ?Reporting period 07/04/21 to 07/25/21 ? ?See Note below for Objective Data and Assessment of Progress/Goals ? ? ?PCP: Elana Alm, MD ?REFERRING PROVIDER: Alison Murray ? ? PT End of Session - 07/25/21 1450   ? ? Visit Number 40   ? Number of Visits 52   ? Date for PT Re-Evaluation 08/16/21   ? Authorization Type Medicare A&B; BCBS - 10th visit PN   ? Authorization Time Period KX MODIFIER REQUIRED NOW   ? Progress Note Due on Visit 40   ? PT Start Time 1446   ? PT Stop Time 1532   ? PT Time Calculation (min) 46 min   ? Equipment Utilized During Treatment Gait belt   ? Activity Tolerance Patient tolerated treatment well   ? Behavior During Therapy [facility] for tasks assessed/performed   ? ?  ?  ? ?  ? ? ?Past Medical History:  ?Diagnosis Date  ? Acute inflammatory demyelinating polyneuropathy (HCC)   ? Acute on chronic respiratory failure with hypoxia (HCC)   ? Atrial fibrillation with RVR (HCC)   ? Dysautonomia (HCC)   ? Healthcare associated bacterial pneumonia   ? ?History reviewed. No pertinent surgical history. ?Patient Active Problem List  ? Diagnosis Date Noted  ? Acute on chronic respiratory failure with hypoxia (HCC)   ? Acute inflammatory demyelinating polyneuropathy (HCC)   ? Dysautonomia (HCC)   ? Atrial fibrillation with RVR (HCC)   ? Healthcare associated bacterial pneumonia   ? ? ?REFERRING DIAG: Guillain Barre ? ?THERAPY DIAG:  ?Other abnormalities of gait and mobility ? ?Muscle weakness (generalized) ? ?Unsteadiness on feet ? ?PERTINENT HISTORY: Chest pain, dissection of descending thoracic aorta, aneurysm of artery, a-fib, HTN, systolic CHF, oral phase dysphagia, sacral wound, PNA ? ?PRECAUTIONS: Monitor systolic BP: keep between 120-130; fall ? ?SUBJECTIVE: Pt reports he was tired after last time but felt like he accomplished  something. Liked being in prone. ? ?PAIN:  ?Are you having pain? No ? ? ? ?TODAY'S TREATMENT:  ?BP at beginning of session=120/62 sitting in left arm. ?Performed standing in front of mat with table in front with 4 bean bags on it. 3 PT assist with 1 on each side to help with knee control to prevent buckling and encourage some flexion to not lock out knees and 1 PT posterior to prevent too much trunk excursion. First worked on finding upright position over legs with cues to unlock knees slightly and shift hips to left some. Moved bean bags to each side of table with each hand x 2. Then rested and repeated with 2 different level surfaces to move bean bags. Pts as legs min assist on sides and 3rd PT mostly CGA at trunk. 3rd bout of standing with mirror in front with only 2 PT assist working on upright posture with slight bend in knees with partial squats x 3. ?Sit to stand from mat min assist of 2. ?Squat-scoot transfer mat to w/c going to right min assist with cues to lean forward and put weight through legs.  ?  BP after standing 128/66 ?Seated alternating hip flexion working on control with mirror in front for visual cues x 10, SAQ and heel slide x 10. Cues for slow, controlled movements. ?Pt performed SciFit with BLE and BUE with active hands utilized to keep hands in place and  belt around LE to maintain neutral hip alignment. PT stabilized w/c to prevent tipping. Verbal cues to push with one leg at a time to get started to help dissociate movements. SciFit x 5 min level 3.0. Pt reported some pain in hips, especially left at end that resided after repositioned. ?BP after SciFit=138/66 ? ? ? ? ? ?HOME EXERCISE PROGRAM: ? ? ? PT Short Term Goals - 07/25/21 1539   ? ?  ? PT SHORT TERM GOAL #1  ? Title = LTG for 30 days 1/22, 60 days 2/21, 90 days 3/23   ? ?  ?  ? ?  ? ? ? PT Long Term Goals - 07/25/21 1539   ? ?  ? PT LONG TERM GOAL #1  ? Title Pt and wife will demonstrate independence with progressed HEP (LTG set at  30 days - reset to 60 days 2/21 and 90 days 3/23)   ? Baseline 07/16/21 Pt continues to perform HEP w/ updates as necessary.   ? Time 4   ? Period Weeks   ? Status Revised   ? Target Date 08/16/21   ?  ? PT LONG TERM GOAL #2  ? Title Pt will demonstrate ability to perform supine <> sit on flat mat with min A   ? Baseline 07/16/21 Pt performs si<>supine w/ ModA   ? Time 4   ? Period Weeks   ? Status Revised   ? Target Date 08/16/21   ?  ? PT LONG TERM GOAL #3  ? Title Pt will perform squat-scooting transfers w/c <> mat consistently to L and R on level surface with min A   ? Baseline mod A squat pivots. 07/25/21 min assist squat-scooting mat to w/c going to right   ? Time 4   ? Period Weeks   ? Status Revised   ? Target Date 08/16/21   ?  ? PT LONG TERM GOAL #4  ? Title Pt will be able to perform sit to/from stand min assist of 1 person.   ? Baseline 07/16/21 pt is Mod A for sit <>stand with second person for safety. 07/25/21 min of 2 people   ? Time 4   ? Period Weeks   ? Status Revised   ? Target Date 08/16/21   ?  ? PT LONG TERM GOAL #5  ? Title Pt will be able to maintain standing >5 min mod assist of 1 person with appropriate braces on legs for improved balance and functional strength.   ? Baseline 07/16/21: still requires multiple therapist assistance to maintain standing >5 minutes   ? Time 4   ? Period Weeks   ? Status Revised   ? Target Date 08/16/21   ? ?  ?  ? ?  ? ? ? Plan - 07/25/21 1542   ? ? Clinical Impression Statement PT focused more on controlled movements with standing activities today. Pt needed verbal and tactile cuing to try to unlock knees with 2 therapists stabilizing knees for safety. He was able to add in some dynamic movements with moving bean bags side to side with BUE. Does better with sit to stand with verbal cues to flex trunk forward. Pt was min assist for squat-scoot transfer today. Pt continues to show progres towards goals and will benefit from continued PT.   ? Personal Factors and  Comorbidities Comorbidity 3+;Past/Current Experience;Time since onset of injury/illness/exacerbation   ? Comorbidities Chest pain, dissection of descending thoracic aorta, aneurysm of artery, a-fib,  HTN, systolic CHF, oral phase dysphagia, sacral wound, PNA   ? Examination-Activity Limitations Bed Mobility;Bend;Locomotion Level;Sit;Stairs;Stand;Transfers;Lift;Hygiene/Grooming;Dressing;Toileting;Self Feeding;Reach Overhead;Bathing   ? Examination-Participation Restrictions Community Activity;Driving;Pincus Badder Work   ? Stability/Clinical Decision Making Evolving/Moderate complexity   ? Clinical Decision Making Moderate   ? Rehab Potential Good   ? PT Frequency 3x / week   ? PT Duration 12 weeks   ? PT Treatment/Interventions ADLs/Self Care Home Management;DME Instruction;Gait training;Stair training;Functional mobility training;Therapeutic activities;Therapeutic exercise;Balance training;Neuromuscular re-education;Wheelchair mobility training;Orthotic Fit/Training;Patient/family education;Energy conservation   ? PT Next Visit Plan Continue to work on squat/pivot transfers to higher surfaces to eventually perform car transfer. Need 27" height with 7" depth for car. Pt reported bed height was also 27".  SciFit for 8 minutes at 2.7 resistance, go up on time NOT resistance.  Continue with use of elevated mat with 14" step and padding to allow WB through forearms with weight shifting trying to keep soft knees if have 2nd person.  If +2 can also go into // bars and have him work on sit > stand - dysom on bars for grip, hand over hand to maintain hand position - may need to wear AFO and knee cage to prevent knee from locking into extension.  Left anterior [place] AFO and right anterior ottobock that has medial post was most comfortable for standing.  Continue to work on grading movements with sit <> supine, rolling, prone > quadruped.  MONITOR BP: Keep SBP between 120-130!   ? Consulted and Agree with Plan of Care Patient   ? ?   ?  ? ?  ? ? ? ? ?Ronn Melena, PT, DPT, NCS ?07/25/2021, 4:14 PM ? ?  ? ?

## 2021-07-25 NOTE — Therapy (Signed)
[facility] [address] [place], [place], [address] Phone: [phone]   Fax:  [phone]  Occupational Therapy Treatment  Patient Details  Name: Brian Espinoza MRN: [id number] Date of Birth: 1955-11-29 Referring Provider (OT): Aundra Dubin (Will send certification to PCP [place])   Encounter Date: 07/25/2021   OT End of Session - 07/25/21 1404     Visit Number 37    Number of Visits 19    Date for OT Re-Evaluation 09/18/21    Authorization Type Medicare and Federal BCBS    Progress Note Due on Visit 36    OT Start Time 1402    OT Stop Time 1445    OT Time Calculation (min) 43 min    Activity Tolerance Patient tolerated treatment well    Behavior During Therapy WFL for tasks assessed/performed             Past Medical History:  Diagnosis Date   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  There were no vitals filed for this visit.   Subjective Assessment - 07/25/21 1404     Subjective  "nothing new"    Currently in Pain? No/denies    Pain Score 0-No pain                          OT Treatments/Exercises (OP) - 07/25/21 0001       Transfers   Comments transfered from tilt in space to edge of mat with scooting to left with CGA for leg management only. Pt used LUE for pushing off and scooting      ADLs   Eating pt able to drink from open cup with max A for managing BUE And stability of styrofoam cup. Pt is able to manipulate cup at home with handle with no difficulty      Neurological Re-education Exercises   Other Exercises 1 Universal Equalizer x 15 reps for chest press, protraction/retraction (15#), pt reqd min A for RUE with chest press. Pt  used active hands BUE for exercises. Arm Bike x 6 minutes with forward and backward movements for BUE conditioning, strengthening and reciprocal movements    Other Exercises 2 seated edge of mat with dynamic reaching across midline and out of base of support with weight bearing into individual handsd/wrist for increased strength and support with BUE. Pt with no LOB and with stablility with seated dynamic balance.                      OT Short Term Goals - 07/18/21 1609       OT SHORT TERM GOAL #1   Title Patient and caregiver will complete a home exercise program designed to improve strength in proximal UE's - shoulders and elbows    Time 4    Period Weeks    Status Achieved   issued AROM exercises and gentle theraband tricep strengthening 04/30/21   Target Date 04/21/21      OT SHORT TERM GOAL #2   Title Following set up patient will hold a cup with lid and long straw in midline to allow him to take a sip on his own    Time 4  Period Weeks    Status Achieved   pt is using [place] with set up assistance. 07/09/20     OT SHORT TERM GOAL #3   Title While seated in upright supported position, patient will reach forward to make contact with upright target on table top (chest height) x 5 each arm    Time 4    Period Weeks    Status Achieved      OT SHORT TERM GOAL #4   Title Patient will be able to reach toward face with either right or left hand in preparation to scratch chin/cheek    Time 4    Period Weeks    Status Achieved      OT SHORT TERM GOAL #5   Title Patient will utilize cylindrical grip to hold small water bottle in one hand.    Time 4    Period Weeks    Status Partially Met   demonstrated holding bottle in RUE however not consistently 04/30/21              OT Long Term Goals - 07/18/21 1610       OT LONG TERM GOAL #1   Title Patient will complete an updated stretching program to prevent contracture in digits    Time 12    Period Weeks     Status Achieved      OT LONG TERM GOAL #2   Title Patient and caregiver will demonstrate awareness of splint wear and care for BUE    Time 12    Period Weeks    Status Achieved   continue to modifiy and address PRN     OT LONG TERM GOAL #3   Title Patient will wash his face and upper body and upper legs with min assist    Time 12    Period Weeks    Status On-going   continuing to progress towards this goal     OT LONG TERM GOAL #4   Title Patient will actively bridge in bed to assist with pericare as needed    Time 12    Period Weeks    Status Partially Met   continuing to progress towards goal     OT LONG TERM GOAL #5   Title Patient will transfer to adapted commode with max assist in prep for toileting    Time 12    Period Weeks    Status On-going   working on troubleshooting [facility] and transfers for continuing to progess towards this goal     OT LONG TERM GOAL #6   Title Patient will transfer into/ out of shower with mod assist, and min assist for seated control during shower    Time 12    Period Weeks    Status On-going   much improvement with control and working towards goal     OT LONG TERM GOAL #7   Title Patient will feed himself 30% meal using utensils after set up    Time 12    Period Weeks    Status On-going    Target Date 09/18/21      OT LONG TERM GOAL #8   Title Patient will assist with pulling pants over hips (up/down) to aide with toileting    Time 12    Period Weeks    Status On-going    Target Date 09/18/21                   Plan - 07/25/21  51     Clinical Impression Statement Pt demonstrated increased independence with transfers today and with increased BUE strengthening assisting with transfers and functional mobility    OT Occupational Profile and History Comprehensive Assessment- Review of records and extensive additional review of physical, cognitive, psychosocial history related to current functional performance    Occupational  performance deficits (Please refer to evaluation for details): ADL's;IADL's;Rest and Sleep;Leisure    Body Structure / Function / Physical Skills ADL;Decreased knowledge of use of DME;Strength;Balance;Dexterity;GMC;Pain;Tone;Body mechanics;Proprioception;UE functional use;Cardiopulmonary status limiting activity;Endurance;IADL;ROM;Fascial restriction;Improper spinal/pelvic alignment;Coordination;Flexibility;Mobility;Sensation;Wound;FMC;Muscle spasms;Skin integrity    Rehab Potential Good    Clinical Decision Making Multiple treatment options, significant modification of task necessary    Comorbidities Affecting Occupational Performance: Presence of comorbidities impacting occupational performance    Comorbidities impacting occupational performance description: AIDP, Chronic back pain, Descending thoracic aortic anneurysm    Modification or Assistance to Complete Evaluation  Min-Moderate modification of tasks or assist with assess necessary to complete eval    OT Frequency 3x / week    OT Duration 12 weeks    OT Treatment/Interventions Self-care/ADL training;Moist Heat;Fluidtherapy;DME and/or AE instruction;Splinting;Balance training;Aquatic Therapy;Contrast Bath;Therapeutic activities;Ultrasound;Therapeutic exercise;Passive range of motion;Functional Mobility Training;Neuromuscular education;Electrical Stimulation;Manual Therapy;Patient/family education;Energy conservation    Plan equalizer with active hands, arm bike with active hands, grasp and release and coordination with functional tasks with BUE    Consulted and Agree with Plan of Care Patient             Patient will benefit from skilled therapeutic intervention in order to improve the following deficits and impairments:   Body Structure / Function / Physical Skills: ADL, Decreased knowledge of use of DME, Strength, Balance, Dexterity, GMC, Pain, Tone, Body mechanics, Proprioception, UE functional use, Cardiopulmonary status limiting  activity, Endurance, IADL, ROM, Fascial restriction, Improper spinal/pelvic alignment, Coordination, Flexibility, Mobility, Sensation, Wound, FMC, Muscle spasms, Skin integrity       Visit Diagnosis: Stiffness of right hand, not elsewhere classified  Muscle weakness (generalized)  Unsteadiness on feet  Other disturbances of skin sensation  Other abnormalities of gait and mobility  Other lack of coordination    Problem List Patient Active Problem List   Diagnosis Date Noted   Acute on chronic respiratory failure with hypoxia (HCC)    Acute inflammatory demyelinating polyneuropathy (HCC)    Dysautonomia ([place])    Atrial fibrillation with RVR ([place])    Healthcare associated bacterial pneumonia     Zachery Conch, OT 07/25/2021, 2:58 PM  [facility] [address] [place], [place], [address] Phone: [phone]   Fax:  [phone]  Name: Brian Espinoza MRN: [id number] Date of Birth: February 02, 1956

## 2021-07-30 ENCOUNTER — Ambulatory Visit: Payer: Medicare Other | Admitting: Occupational Therapy

## 2021-07-30 ENCOUNTER — Encounter: Payer: Self-pay | Admitting: Occupational Therapy

## 2021-07-30 ENCOUNTER — Ambulatory Visit: Payer: Medicare Other | Admitting: Physical Therapy

## 2021-07-30 ENCOUNTER — Other Ambulatory Visit: Payer: Self-pay

## 2021-07-30 VITALS — BP 137/55

## 2021-07-30 VITALS — BP 97/60 | HR 53

## 2021-07-30 DIAGNOSIS — M25642 Stiffness of left hand, not elsewhere classified: Secondary | ICD-10-CM

## 2021-07-30 DIAGNOSIS — R2689 Other abnormalities of gait and mobility: Secondary | ICD-10-CM

## 2021-07-30 DIAGNOSIS — M6281 Muscle weakness (generalized): Secondary | ICD-10-CM

## 2021-07-30 DIAGNOSIS — R208 Other disturbances of skin sensation: Secondary | ICD-10-CM

## 2021-07-30 DIAGNOSIS — M25641 Stiffness of right hand, not elsewhere classified: Secondary | ICD-10-CM

## 2021-07-30 DIAGNOSIS — R2681 Unsteadiness on feet: Secondary | ICD-10-CM

## 2021-07-30 DIAGNOSIS — R295 Transient paralysis: Secondary | ICD-10-CM

## 2021-07-30 DIAGNOSIS — R278 Other lack of coordination: Secondary | ICD-10-CM

## 2021-07-30 NOTE — Therapy (Addendum)
OUTPATIENT PHYSICAL THERAPY TREATMENT NOTE   Patient Name: Brian Espinoza MRN: [id number] DOB:24-Jul-1955, 66 y.o., male Today's Date: 07/30/2021  PCP: Ellis Parents, MD REFERRING PROVIDER: Leandro Reasoner   PT End of Session - 07/30/21 1503     Visit Number 41    Number of Visits 57    Date for PT Re-Evaluation 08/16/21    Authorization Type Medicare A&B; BCBS - 10th visit PN    Authorization Time Period KX MODIFIER REQUIRED NOW    Progress Note Due on Visit 86    PT Start Time 1403    PT Stop Time 1448    PT Time Calculation (min) 45 min    Activity Tolerance Patient tolerated treatment well    Behavior During Therapy WFL for tasks assessed/performed              Past Medical History:  Diagnosis Date   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 Patient Active Problem List   Diagnosis Date Noted   Acute on chronic respiratory failure with hypoxia (HCC)    Acute inflammatory demyelinating polyneuropathy ([facility])    Dysautonomia ([facility])    Atrial fibrillation with RVR ([place])    Healthcare associated bacterial pneumonia     REFERRING DIAG: Guillain Barre  THERAPY DIAG:  Other abnormalities of gait and mobility  Muscle weakness (generalized)  Transient paralysis  Unsteadiness on feet  Other disturbances of skin sensation  PERTINENT HISTORY: Chest pain, dissection of descending thoracic aorta, aneurysm of artery, a-fib, HTN, systolic CHF, oral phase dysphagia, sacral wound, PNA  PRECAUTIONS: Monitor systolic BP: keep between 120-130; fall  SUBJECTIVE: Had a good weekend, spent some time outside watching wife do yard work.  PAIN:  Are you having pain? No  TODAY'S TREATMENT:  07/30/2021:   SciFit: set up on SciFit to perform endurance first with UE and LE  at level 2.6 resistance but increased time to 8:45 with active hands utilized to keep hands in place and belt around LE to maintain neutral hip alignment. PT stabilized w/c to prevent tipping.  Lowered UE and increased resistance to 3.0 x 3 minutes with LE only to focus on alternating, reciprocal LE extension.  BP after SCI FIT: 137/55; HR: 62.    Transitioned to seated in front of elevated mat; placed dysom on mat to provide traction for UE on mat and prevent sliding. Performed multiple transitions from sit > squat with elbows supported on mat for WB and stabilization through elbows and shoulders while PT provided cues to extend through elbows to bring trunk upright, to soften knees and keep COG over BOS without shifting too far forwards or backwards.  Progressed to weight shifting laterally in squat position and attempting to side step each LE  out and then back under COG.  Utilized +2 to perform; able to perform weight shift over LLE and advance RLE slightly out to the side and back in.  Required multiple attempts and max-total A to lift and side step LLE out to the side and back in; when attempting to lift LLE pt would experience flexion moment on RLE.    Still requires intermittent rest breaks with wheelchair tilted backwards to remove pressure from coccyx.     07/25/2021: BP at beginning of session=120/62 sitting in left  arm. Performed standing in front of mat with table in front with 4 bean bags on it. 3 PT assist with 1 on each side to help with knee control to prevent buckling and encourage some flexion to not lock out knees and 1 PT posterior to prevent too much trunk excursion. First worked on finding upright position over legs with cues to unlock knees slightly and shift hips to left some. Moved bean bags to each side of table with each hand x 2. Then rested and repeated with 2 different level surfaces to move bean bags. Pts as legs min assist on sides and 3rd PT mostly CGA at trunk. 3rd bout of  standing with mirror in front with only 2 PT assist working on upright posture with slight bend in knees with partial squats x 3. Sit to stand from mat min assist of 2. Squat-scoot transfer mat to w/c going to right min assist with cues to lean forward and put weight through legs.    BP after standing 128/66 Seated alternating hip flexion working on control with mirror in front for visual cues x 10, SAQ and heel slide x 10. Cues for slow, controlled movements. Pt performed SciFit with BLE and BUE with active hands utilized to keep hands in place and belt around LE to maintain neutral hip alignment. PT stabilized w/c to prevent tipping. Verbal cues to push with one leg at a time to get started to help dissociate movements. SciFit x 5 min level 3.0. Pt reported some pain in hips, especially left at end that resided after repositioned. BP after SciFit=138/66   HOME EXERCISE PROGRAM:  No changes to HEP today    PT Short Term Goals - 07/25/21 1539       PT SHORT TERM GOAL #1   Title = LTG for 30 days 1/22, 60 days 2/21, 90 days 3/23              PT Long Term Goals - 07/25/21 1539       PT LONG TERM GOAL #1   Title Pt and wife will demonstrate independence with progressed HEP (LTG set at 30 days - reset to 60 days 2/21 and 90 days 3/23)    Baseline 07/16/21 Pt continues to perform HEP w/ updates as necessary.    Time 4    Period Weeks    Status Revised    Target Date 08/16/21      PT LONG TERM GOAL #2   Title Pt will demonstrate ability to perform supine <> sit on flat mat with min A    Baseline 07/16/21 Pt performs si<>supine w/ ModA    Time 4    Period Weeks    Status Revised    Target Date 08/16/21      PT LONG TERM GOAL #3   Title Pt will perform squat-scooting transfers w/c <> mat consistently to L and R on level surface with min A    Baseline mod A squat pivots. 07/25/21 min assist squat-scooting mat to w/c going to right    Time 4    Period Weeks    Status Revised     Target Date 08/16/21      PT LONG TERM GOAL #4   Title Pt will be able to perform sit to/from stand min assist of 1 person.    Baseline 07/16/21 pt is Mod A for sit <>stand with second person for safety. 07/25/21 min of 2 people    Time 4  Period Weeks    Status Revised    Target Date 08/16/21      PT LONG TERM GOAL #5   Title Pt will be able to maintain standing >5 min mod assist of 1 person with appropriate braces on legs for improved balance and functional strength.    Baseline 07/16/21: still requires multiple therapist assistance to maintain standing >5 minutes    Time 4    Period Weeks    Status Revised    Target Date 08/16/21              Plan - 07/25/21 1542       Clinical Impression Statement Progressed endurance and strength training on SciFit by increasing resistance (but decreased time) and utilized LE only.  Pt experienced abnormal BP response to exercise today; will continue to monitor and report to cardiologist.  Pt demonstrated improved UE and shoulder stability in squat/WB today; continues to experience difficulty with coordinating, initiating, and controlling LE movement/advancement in WB positions but is requiring less assistance to weight shift forwards and transition into squat position.  Will continue to address and progress towards LTG.    Personal Factors and Comorbidities Comorbidity 3+;Past/Current Experience;Time since onset of injury/illness/exacerbation     Comorbidities Chest pain, dissection of descending thoracic aorta, aneurysm of artery, a-fib, HTN, systolic CHF, oral phase dysphagia, sacral wound, PNA     Examination-Activity Limitations Bed Mobility;Bend;Locomotion Level;Sit;Stairs;Stand;Transfers;Lift;Hygiene/Grooming;Dressing;Toileting;Self Feeding;Reach Overhead;Bathing     Examination-Participation Restrictions Community Activity;Driving;Yard Work     [employer] Evolving/Moderate complexity     Clinical Decision Making  Moderate     Rehab Potential Good     PT Frequency 3x / week     PT Duration 12 weeks     PT Treatment/Interventions ADLs/Self Care Home Management;DME Instruction;Gait training;Stair training;Functional mobility training;Therapeutic activities;Therapeutic exercise;Balance training;Neuromuscular re-education;Wheelchair mobility training;Orthotic Fit/Training;Patient/family education;Energy conservation     PT Next Visit Plan Check BP before and after exercise - this session his sys and diastolic went DOWN after exercise and kept going down.  SciFit with LE only, 3.0 resistance.  Continue to work on [place] transfers to higher surfaces to eventually perform car transfer. Need 27" height with 7" depth for car. Pt reported bed height was also 27". Continue with use of elevated mat coming to squat to allow WB through forearms with weight shifting trying to keep soft knees if have 2nd person.  If +2 can also go into // bars and have him work on sit > stand - dysom on bars for grip, hand over hand to maintain hand position - may need to wear AFO and knee cage to prevent knee from locking into extension.  Left anterior [place] AFO and right anterior ottobock that has medial post was most comfortable for standing.  Continue to work on grading movements with sit <> supine, rolling, prone > quadruped.  MONITOR BP: Keep SBP between 120-130!     Consulted and Agree with Plan of Care Patient    Rico Junker, PT, DPT 07/30/21    3:25 PM

## 2021-07-30 NOTE — Therapy (Signed)
[facility] [address] [place], [place], [address] Phone: [phone]   Fax:  [phone]  Occupational Therapy Treatment  Patient Details  Name: Brian Espinoza MRN: [id number] Date of Birth: January 31, 1956 Referring Provider (OT): Aundra Dubin (Will send certification to PCP [place])   Encounter Date: 07/30/2021   OT End of Session - 07/30/21 1448     Visit Number 38    Number of Visits 9    Date for OT Re-Evaluation 09/18/21    Authorization Type Medicare and Federal BCBS    Progress Note Due on Visit 70    OT Start Time 1446    OT Stop Time 1530    OT Time Calculation (min) 44 min    Activity Tolerance Patient tolerated treatment well    Behavior During Therapy WFL for tasks assessed/performed             Past Medical History:  Diagnosis Date   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 No pertinent surgical history.  Vitals:   07/30/21 1448 07/30/21 1521  BP: (!) 106/47 97/60  Pulse: (!) 52 (!) 53     Subjective Assessment - 07/30/21 1448     Subjective  "it was very nice"    Currently in Pain? No/denies    Pain Score 0-No pain                          OT Treatments/Exercises (OP) - 07/30/21 1529       Neurological Re-education Exercises   Other Exercises 1 Universal Equalizer x 15 reps for chest press, protraction/retraction (15#), pt reqd min A for RUE with vertical chest press. Pt completed BUE together and then a set with each UE inidividually. Pt neutral pull down with 10# BUE (individually).  Pt used active hands BUE for exercises. Arm Bike x 8 minutes with forward and backward movements level 2 for BUE conditioning, strengthening and reciprocal  movements             Monitored pt's blood pressure as pt's blood pressure decreased (diastolic and systolic) after aerobic exercise.         OT Short Term Goals - 07/18/21 1609       OT SHORT TERM GOAL #1   Title Patient and caregiver will complete a home exercise program designed to improve strength in proximal UE's - shoulders and elbows    Time 4    Period Weeks    Status Achieved   issued AROM exercises and gentle theraband tricep strengthening 04/30/21   Target Date 04/21/21      OT SHORT TERM GOAL #2   Title Following set up patient will hold a cup with lid and long straw in midline to allow him to take a sip on his own    Time 4    Period Weeks    Status Achieved   pt is using [place] with set up assistance. 07/09/20     OT SHORT TERM GOAL #3   Title While seated in upright supported position, patient will reach forward to make contact with upright target on table top (chest height) x 5 each arm    Time 4    Period Weeks  Status Achieved      OT SHORT TERM GOAL #4   Title Patient will be able to reach toward face with either right or left hand in preparation to scratch chin/cheek    Time 4    Period Weeks    Status Achieved      OT SHORT TERM GOAL #5   Title Patient will utilize cylindrical grip to hold small water bottle in one hand.    Time 4    Period Weeks    Status Partially Met   demonstrated holding bottle in RUE however not consistently 04/30/21              OT Long Term Goals - 07/18/21 1610       OT LONG TERM GOAL #1   Title Patient will complete an updated stretching program to prevent contracture in digits    Time 12    Period Weeks    Status Achieved      OT LONG TERM GOAL #2   Title Patient and caregiver will demonstrate awareness of splint wear and care for BUE    Time 12    Period Weeks    Status Achieved   continue to modifiy and address PRN     OT LONG TERM GOAL #3   Title Patient will wash his face and upper body  and upper legs with min assist    Time 12    Period Weeks    Status On-going   continuing to progress towards this goal     OT LONG TERM GOAL #4   Title Patient will actively bridge in bed to assist with pericare as needed    Time 12    Period Weeks    Status Partially Met   continuing to progress towards goal     OT LONG TERM GOAL #5   Title Patient will transfer to adapted commode with max assist in prep for toileting    Time 12    Period Weeks    Status On-going   working on troubleshooting [facility] and transfers for continuing to progess towards this goal     OT LONG TERM GOAL #6   Title Patient will transfer into/ out of shower with mod assist, and min assist for seated control during shower    Time 12    Period Weeks    Status On-going   much improvement with control and working towards goal     OT LONG TERM GOAL #7   Title Patient will feed himself 30% meal using utensils after set up    Time 12    Period Weeks    Status On-going    Target Date 09/18/21      OT LONG TERM GOAL #8   Title Patient will assist with pulling pants over hips (up/down) to aide with toileting    Time 12    Period Weeks    Status On-going    Target Date 09/18/21                   Plan - 07/30/21 1534     Clinical Impression Statement Pt continues to demonstrate increased BUE strengthening with endurance.    OT Occupational Profile and History Comprehensive Assessment- Review of records and extensive additional review of physical, cognitive, psychosocial history related to current functional performance    Occupational performance deficits (Please refer to evaluation for details): ADL's;IADL's;Rest and Sleep;Leisure    Body Structure / Function / Physical Skills  ADL;Decreased knowledge of use of DME;Strength;Balance;Dexterity;GMC;Pain;Tone;Body mechanics;Proprioception;UE functional use;Cardiopulmonary status limiting activity;Endurance;IADL;ROM;Fascial restriction;Improper spinal/pelvic  alignment;Coordination;Flexibility;Mobility;Sensation;Wound;FMC;Muscle spasms;Skin integrity    Rehab Potential Good    Clinical Decision Making Multiple treatment options, significant modification of task necessary    Comorbidities Affecting Occupational Performance: Presence of comorbidities impacting occupational performance    Comorbidities impacting occupational performance description: AIDP, Chronic back pain, Descending thoracic aortic anneurysm    Modification or Assistance to Complete Evaluation  Min-Moderate modification of tasks or assist with assess necessary to complete eval    OT Frequency 3x / week    OT Duration 12 weeks    OT Treatment/Interventions Self-care/ADL training;Moist Heat;Fluidtherapy;DME and/or AE instruction;Splinting;Balance training;Aquatic Therapy;Contrast Bath;Therapeutic activities;Ultrasound;Therapeutic exercise;Passive range of motion;Functional Mobility Training;Neuromuscular education;Electrical Stimulation;Manual Therapy;Patient/family education;Energy conservation    Plan equalizer with active hands, arm bike with active hands, grasp and release and coordination with functional tasks with BUE    Consulted and Agree with Plan of Care Patient             Patient will benefit from skilled therapeutic intervention in order to improve the following deficits and impairments:   Body Structure / Function / Physical Skills: ADL, Decreased knowledge of use of DME, Strength, Balance, Dexterity, GMC, Pain, Tone, Body mechanics, Proprioception, UE functional use, Cardiopulmonary status limiting activity, Endurance, IADL, ROM, Fascial restriction, Improper spinal/pelvic alignment, Coordination, Flexibility, Mobility, Sensation, Wound, FMC, Muscle spasms, Skin integrity       Visit Diagnosis: Stiffness of left hand, not elsewhere classified  Other abnormalities of gait and mobility  Muscle weakness (generalized)  Stiffness of right hand, not elsewhere  classified  Unsteadiness on feet  Other disturbances of skin sensation  Other lack of coordination    Problem List Patient Active Problem List   Diagnosis Date Noted   Acute on chronic respiratory failure with hypoxia (HCC)    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 [place], [place], [address] Phone: [phone]   Fax:  [phone]  Name: Schylar Wuebker MRN: [id number] Date of Birth: 04-25-1956

## 2021-08-01 ENCOUNTER — Ambulatory Visit: Payer: Medicare Other

## 2021-08-01 ENCOUNTER — Encounter: Payer: Self-pay | Admitting: Occupational Therapy

## 2021-08-01 ENCOUNTER — Ambulatory Visit: Payer: Medicare Other | Admitting: Occupational Therapy

## 2021-08-01 ENCOUNTER — Other Ambulatory Visit: Payer: Self-pay

## 2021-08-01 VITALS — BP 118/60

## 2021-08-01 DIAGNOSIS — M6281 Muscle weakness (generalized): Secondary | ICD-10-CM

## 2021-08-01 DIAGNOSIS — M25641 Stiffness of right hand, not elsewhere classified: Secondary | ICD-10-CM | POA: Diagnosis not present

## 2021-08-01 DIAGNOSIS — R2681 Unsteadiness on feet: Secondary | ICD-10-CM

## 2021-08-01 DIAGNOSIS — M25642 Stiffness of left hand, not elsewhere classified: Secondary | ICD-10-CM

## 2021-08-01 DIAGNOSIS — R2689 Other abnormalities of gait and mobility: Secondary | ICD-10-CM

## 2021-08-01 DIAGNOSIS — R208 Other disturbances of skin sensation: Secondary | ICD-10-CM

## 2021-08-01 DIAGNOSIS — R278 Other lack of coordination: Secondary | ICD-10-CM

## 2021-08-01 NOTE — Therapy (Signed)
OUTPATIENT PHYSICAL THERAPY TREATMENT NOTE   Patient Name: Brian Espinoza MRN: [id number] DOB:14-Oct-1955, 66 y.o., male Today's Date: 08/01/2021  PCP: Elana Alm, MD REFERRING PROVIDER: Alison Murray   PT End of Session - 08/01/21 1314     Visit Number 42    Number of Visits 52    Date for PT Re-Evaluation 08/16/21    Authorization Type Medicare A&B; BCBS - 10th visit PN    Authorization Time Period KX MODIFIER REQUIRED NOW    Progress Note Due on Visit 50    PT Start Time 1314    PT Stop Time 1400    PT Time Calculation (min) 46 min    Equipment Utilized During Treatment Gait belt    Activity Tolerance Patient tolerated treatment well    Behavior During Therapy WFL for tasks assessed/performed              Past Medical History:  Diagnosis Date   Acute inflammatory demyelinating polyneuropathy (HCC)    Acute on chronic respiratory failure with hypoxia (HCC)    Atrial fibrillation with RVR (HCC)    Dysautonomia (H[facility])    Healthcare associated bacterial pneumonia    History reviewed. No pertinent surgical history. Patient Active Problem List   Diagnosis Date Noted   Acute on chronic respiratory failure with hypoxia (HCC)    Acute inflammatory demyelinating polyneuropathy (HCC)    Dysautonomia (H[facility])    Atrial fibrillation with RVR (H[facility])    Healthcare associated bacterial pneumonia     REFERRING DIAG: Guillain Barre  THERAPY DIAG:  Other abnormalities of gait and mobility  Muscle weakness (generalized)  PERTINENT HISTORY: Chest pain, dissection of descending thoracic aorta, aneurysm of artery, a-fib, HTN, systolic CHF, oral phase dysphagia, sacral wound, PNA  PRECAUTIONS: Monitor systolic BP: keep between 120-130; fall  SUBJECTIVE: Pt reports that he thinks he needed to eat some sugar to help BP last time as ate ice cream at home and BP was better. PT explained that sugar should not affect BP.  PAIN:  Are you having pain? No  TODAY'S TREATMENT:    08/01/21:  BP=118/60 SciFit x 9 min level 2.8 with BUE and BLE with active hands and gait belt around thighs to keep legs in neutral. PT stabilizing w/c to prevent tipping with blocks behind wheels as well. PT  BP=122/62 after. Completed another 7 min at 3.0. BP=122/62 and HR=64 after. Pt had to lean back between bouts to take some pressure off tailbone. Transfer w/c to/from mat with squat-pivot with pt scooting forwards first in chair one leg at a time. With transfer pt cued to lean forward to put weight through legs. Pt able to shift weight forward and assist with lateral movement once up min assist.  07/30/2021:   SciFit: set up on SciFit to perform endurance first with UE and LE at level 2.6 resistance but increased time to 8:45 with active hands utilized to keep hands in place and belt around LE to maintain neutral hip alignment. PT stabilized w/c to prevent tipping.  Lowered UE and increased resistance to 3.0 x 3 minutes with LE only to focus on alternating, reciprocal LE extension.  BP after SCI FIT: 137/55; HR: 62.   Transitioned to seated in front of elevated mat; placed dysom on mat to provide traction for UE on mat and prevent sliding. Performed multiple transitions from sit > squat with elbows supported on mat for WB and stabilization through elbows and shoulders while PT provided cues to extend through  elbows to bring trunk upright, to soften knees and keep COG over BOS without shifting too far forwards or backwards.  Progressed to weight shifting laterally in squat position and attempting to side step each LE  out and then back under COG.  Utilized +2 to perform; able to perform weight shift over LLE and advance RLE slightly out to the side and back in.  Required multiple attempts and max-total A to lift and side step LLE out to the side and back in; when attempting to lift LLE pt would experience flexion moment on RLE.    Still requires intermittent rest breaks with wheelchair tilted backwards  to remove pressure from coccyx.     07/25/2021: BP at beginning of session=120/62 sitting in left arm. Performed standing in front of mat with table in front with 4 bean bags on it. 3 PT assist with 1 on each side to help with knee control to prevent buckling and encourage some flexion to not lock out knees and 1 PT posterior to prevent too much trunk excursion. First worked on finding upright position over legs with cues to unlock knees slightly and shift hips to left some. Moved bean bags to each side of table with each hand x 2. Then rested and repeated with 2 different level surfaces to move bean bags. Pts as legs min assist on sides and 3rd PT mostly CGA at trunk. 3rd bout of standing with mirror in front with only 2 PT assist working on upright posture with slight bend in knees with partial squats x 3. Sit to stand from mat min assist of 2. Squat-scoot transfer mat to w/c going to right min assist with cues to lean forward and put weight through legs.    BP after standing 128/66 Seated alternating hip flexion working on control with mirror in front for visual cues x 10, SAQ and heel slide x 10. Cues for slow, controlled movements. Pt performed SciFit with BLE and BUE with active hands utilized to keep hands in place and belt around LE to maintain neutral hip alignment. PT stabilized w/c to prevent tipping. Verbal cues to push with one leg at a time to get started to help dissociate movements. SciFit x 5 min level 3.0. Pt reported some pain in hips, especially left at end that resided after repositioned. BP after SciFit=138/66   HOME EXERCISE PROGRAM:  No changes to HEP today    PT Short Term Goals - 07/25/21 1539       PT SHORT TERM GOAL #1   Title = LTG for 30 days 1/22, 60 days 2/21, 90 days 3/23              PT Long Term Goals - 07/25/21 1539       PT LONG TERM GOAL #1   Title Pt and wife will demonstrate independence with progressed HEP (LTG set at 30 days - reset to 60 days  2/21 and 90 days 3/23)    Baseline 07/16/21 Pt continues to perform HEP w/ updates as necessary.    Time 4    Period Weeks    Status Revised    Target Date 08/16/21      PT LONG TERM GOAL #2   Title Pt will demonstrate ability to perform supine <> sit on flat mat with min A    Baseline 07/16/21 Pt performs si<>supine w/ ModA    Time 4    Period Weeks    Status Revised    Target  Date 08/16/21      PT LONG TERM GOAL #3   Title Pt will perform squat-scooting transfers w/c <> mat consistently to L and R on level surface with min A    Baseline mod A squat pivots. 07/25/21 min assist squat-scooting mat to w/c going to right    Time 4    Period Weeks    Status Revised    Target Date 08/16/21      PT LONG TERM GOAL #4   Title Pt will be able to perform sit to/from stand min assist of 1 person.    Baseline 07/16/21 pt is Mod A for sit <>stand with second person for safety. 07/25/21 min of 2 people    Time 4    Period Weeks    Status Revised    Target Date 08/16/21      PT LONG TERM GOAL #5   Title Pt will be able to maintain standing >5 min mod assist of 1 person with appropriate braces on legs for improved balance and functional strength.    Baseline 07/16/21: still requires multiple therapist assistance to maintain standing >5 minutes    Time 4    Period Weeks    Status Revised    Target Date 08/16/21              Plan - 07/25/21 1542       Clinical Impression Statement Pt's BP did not drop with activities today. Monitored closely. Pt able to increase time on SciFit as well as resistance with better initiation of movements showing more dissociation at legs when starting.    Personal Factors and Comorbidities Comorbidity 3+;Past/Current Experience;Time since onset of injury/illness/exacerbation     Comorbidities Chest pain, dissection of descending thoracic aorta, aneurysm of artery, a-fib, HTN, systolic CHF, oral phase dysphagia, sacral wound, PNA     Examination-Activity  Limitations Bed Mobility;Bend;Locomotion Level;Sit;Stairs;Stand;Transfers;Lift;Hygiene/Grooming;Dressing;Toileting;Self Feeding;Reach Overhead;Bathing     Examination-Participation Restrictions Community Activity;Driving;Yard Work     Conservation officer, historic buildings Evolving/Moderate complexity     Clinical Decision Making Moderate     Rehab Potential Good     PT Frequency 3x / week     PT Duration 12 weeks     PT Treatment/Interventions ADLs/Self Care Home Management;DME Instruction;Gait training;Stair training;Functional mobility training;Therapeutic activities;Therapeutic exercise;Balance training;Neuromuscular re-education;Wheelchair mobility training;Orthotic Fit/Training;Patient/family education;Energy conservation     PT Next Visit Plan   MONITOR BP: Keep SBP between 120-130! Be sure not dropping as well. SciFit with LE only, 3.0 resistance.  Continue to work on squat/pivot transfers to higher surfaces to eventually perform car transfer. Need 27" height with 7" depth for car. Pt reported bed height was also 27". Continue with use of elevated mat coming to squat to allow WB through forearms with weight shifting trying to keep soft knees if have 2nd person.  If +2 can also go into // bars and have him work on sit > stand - dysom on bars for grip, hand over hand to maintain hand position - may need to wear AFO and knee cage to prevent knee from locking into extension.  Left anterior [place] AFO and right anterior ottobock that has medial post was most comfortable for standing.  Continue to work on grading movements with sit <> supine, rolling, prone > quadruped.      Consulted and Agree with Plan of Care Patient    Elmer Bales PT, DPT, NCS 08/01/21    4:31 PM

## 2021-08-01 NOTE — Therapy (Signed)
[facility] [address] [place], [place], [address] Phone: [phone]   Fax:  [phone]  Occupational Therapy Treatment  Patient Details  Name: Brian Espinoza MRN: [id number] Date of Birth: Jun 12, 1955 Referring Provider (OT): Aundra Dubin (Will send certification to PCP [place])   Encounter Date: 08/01/2021   OT End of Session - 08/01/21 1403     Visit Number 39    Number of Visits 35    Date for OT Re-Evaluation 09/18/21    Authorization Type Medicare and Federal BCBS    Progress Note Due on Visit 40    OT Start Time 1402    OT Stop Time 1445    OT Time Calculation (min) 43 min    Activity Tolerance Patient tolerated treatment well    Behavior During Therapy WFL for tasks assessed/performed             Past Medical History:  Diagnosis Date   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 No pertinent surgical history.  There were no vitals filed for this visit.   Subjective Assessment - 08/01/21 1402     Subjective  Pt reports feeling good and denies any pain.    Currently in Pain? No/denies    Pain Score 0-No pain                          OT Treatments/Exercises (OP) - 08/01/21 1407       Elbow Exercises   Other elbow exercises tricep dips / wheelchair push ups x 3 sets x 10 reps with assistance for managing BLE positioning and stability and dycem for for managing hand placement. Pt able to maintain hand positioning with dycem and only 1 slip in all reps.      Neurological Re-education Exercises   Other Exercises 1 standing with parallel bars in front with pulling up and standing x 3. Pt with difficulty maintaining neutral knees and with hyperextension in BLE  knees. When cued for slight bending at BLE knees, patient with difficulty controlling and with collapse. Pt able to pull up to standing with min A but req'd assistance x 2 with maintaining standing and return to sitting.    Other Exercises 2 arm bike x 8 minutes with forward and backward movements for reciprocal movements and conditioning on level 3. Active hands req'd for maintaiing hand positioning iwth reinforcement of ace bandage on LUE                      OT Short Term Goals - 07/18/21 1609       OT SHORT TERM GOAL #1   Title Patient and caregiver will complete a home exercise program designed to improve strength in proximal UE's - shoulders and elbows    Time 4    Period Weeks    Status Achieved   issued AROM exercises and gentle theraband tricep strengthening 04/30/21   Target Date 04/21/21      OT SHORT TERM GOAL #2   Title Following set up patient will hold a cup with lid and long straw in midline to allow him to take a sip on his own    Time 4    Period  Weeks    Status Achieved   pt is using [place] with set up assistance. 07/09/20     OT SHORT TERM GOAL #3   Title While seated in upright supported position, patient will reach forward to make contact with upright target on table top (chest height) x 5 each arm    Time 4    Period Weeks    Status Achieved      OT SHORT TERM GOAL #4   Title Patient will be able to reach toward face with either right or left hand in preparation to scratch chin/cheek    Time 4    Period Weeks    Status Achieved      OT SHORT TERM GOAL #5   Title Patient will utilize cylindrical grip to hold small water bottle in one hand.    Time 4    Period Weeks    Status Partially Met   demonstrated holding bottle in RUE however not consistently 04/30/21              OT Long Term Goals - 07/18/21 1610       OT LONG TERM GOAL #1   Title Patient will complete an updated stretching program to prevent contracture in digits    Time  12    Period Weeks    Status Achieved      OT LONG TERM GOAL #2   Title Patient and caregiver will demonstrate awareness of splint wear and care for BUE    Time 12    Period Weeks    Status Achieved   continue to modifiy and address PRN     OT LONG TERM GOAL #3   Title Patient will wash his face and upper body and upper legs with min assist    Time 12    Period Weeks    Status On-going   continuing to progress towards this goal     OT LONG TERM GOAL #4   Title Patient will actively bridge in bed to assist with pericare as needed    Time 12    Period Weeks    Status Partially Met   continuing to progress towards goal     OT LONG TERM GOAL #5   Title Patient will transfer to adapted commode with max assist in prep for toileting    Time 12    Period Weeks    Status On-going   working on troubleshooting [facility] and transfers for continuing to progess towards this goal     OT LONG TERM GOAL #6   Title Patient will transfer into/ out of shower with mod assist, and min assist for seated control during shower    Time 12    Period Weeks    Status On-going   much improvement with control and working towards goal     OT LONG TERM GOAL #7   Title Patient will feed himself 30% meal using utensils after set up    Time 12    Period Weeks    Status On-going    Target Date 09/18/21      OT LONG TERM GOAL #8   Title Patient will assist with pulling pants over hips (up/down) to aide with toileting    Time 12    Period Weeks    Status On-going    Target Date 09/18/21                   Plan - 08/01/21 1403  Clinical Impression Statement Pt continues to demonstrate increased BUE strengthening with endurance.    OT Occupational Profile and History Comprehensive Assessment- Review of records and extensive additional review of physical, cognitive, psychosocial history related to current functional performance    Occupational performance deficits (Please refer to evaluation for  details): ADL's;IADL's;Rest and Sleep;Leisure    Body Structure / Function / Physical Skills ADL;Decreased knowledge of use of DME;Strength;Balance;Dexterity;GMC;Pain;Tone;Body mechanics;Proprioception;UE functional use;Cardiopulmonary status limiting activity;Endurance;IADL;ROM;Fascial restriction;Improper spinal/pelvic alignment;Coordination;Flexibility;Mobility;Sensation;Wound;FMC;Muscle spasms;Skin integrity    Rehab Potential Good    Clinical Decision Making Multiple treatment options, significant modification of task necessary    Comorbidities Affecting Occupational Performance: Presence of comorbidities impacting occupational performance    Comorbidities impacting occupational performance description: AIDP, Chronic back pain, Descending thoracic aortic anneurysm    Modification or Assistance to Complete Evaluation  Min-Moderate modification of tasks or assist with assess necessary to complete eval    OT Frequency 3x / week    OT Duration 12 weeks    OT Treatment/Interventions Self-care/ADL training;Moist Heat;Fluidtherapy;DME and/or AE instruction;Splinting;Balance training;Aquatic Therapy;Contrast Bath;Therapeutic activities;Ultrasound;Therapeutic exercise;Passive range of motion;Functional Mobility Training;Neuromuscular education;Electrical Stimulation;Manual Therapy;Patient/family education;Energy conservation    Plan equalizer with active hands, arm bike with active hands, grasp and release and coordination with functional tasks with BUE, monitor blood pressure    Consulted and Agree with Plan of Care Patient             Patient will benefit from skilled therapeutic intervention in order to improve the following deficits and impairments:   Body Structure / Function / Physical Skills: ADL, Decreased knowledge of use of DME, Strength, Balance, Dexterity, GMC, Pain, Tone, Body mechanics, Proprioception, UE functional use, Cardiopulmonary status limiting activity, Endurance, IADL, ROM,  Fascial restriction, Improper spinal/pelvic alignment, Coordination, Flexibility, Mobility, Sensation, Wound, FMC, Muscle spasms, Skin integrity       Visit Diagnosis: Other lack of coordination  Other abnormalities of gait and mobility  Muscle weakness (generalized)  Unsteadiness on feet  Other disturbances of skin sensation  Stiffness of left hand, not elsewhere classified  Stiffness of right hand, not elsewhere classified    Problem List Patient Active Problem List   Diagnosis Date Noted   Acute on chronic respiratory failure with hypoxia (HCC)    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

## 2021-08-03 ENCOUNTER — Other Ambulatory Visit: Payer: Self-pay

## 2021-08-03 ENCOUNTER — Ambulatory Visit: Payer: Medicare Other

## 2021-08-03 ENCOUNTER — Ambulatory Visit: Payer: Medicare Other | Admitting: Occupational Therapy

## 2021-08-03 DIAGNOSIS — R278 Other lack of coordination: Secondary | ICD-10-CM

## 2021-08-03 DIAGNOSIS — R208 Other disturbances of skin sensation: Secondary | ICD-10-CM

## 2021-08-03 DIAGNOSIS — M25641 Stiffness of right hand, not elsewhere classified: Secondary | ICD-10-CM | POA: Diagnosis not present

## 2021-08-03 DIAGNOSIS — R2689 Other abnormalities of gait and mobility: Secondary | ICD-10-CM

## 2021-08-03 DIAGNOSIS — M6281 Muscle weakness (generalized): Secondary | ICD-10-CM

## 2021-08-03 DIAGNOSIS — M25642 Stiffness of left hand, not elsewhere classified: Secondary | ICD-10-CM

## 2021-08-03 DIAGNOSIS — R2681 Unsteadiness on feet: Secondary | ICD-10-CM

## 2021-08-03 NOTE — Therapy (Signed)
OUTPATIENT PHYSICAL THERAPY TREATMENT NOTE   Patient Name: Brian Espinoza MRN: [id number] DOB:10-Aug-1955, 66 y.o., male Today's Date: 08/03/2021  PCP: Elana Alm, MD REFERRING PROVIDER: Alison Murray   PT End of Session - 08/03/21 1320     Visit Number 43    Number of Visits 52    Date for PT Re-Evaluation 08/16/21    Authorization Type Medicare A&B; BCBS - 10th visit PN    Authorization Time Period KX MODIFIER REQUIRED NOW    Progress Note Due on Visit 50    PT Start Time 1318    PT Stop Time 1400    PT Time Calculation (min) 42 min    Equipment Utilized During Treatment Gait belt    Activity Tolerance Patient tolerated treatment well    Behavior During Therapy WFL for tasks assessed/performed              Past Medical History:  Diagnosis Date   Acute inflammatory demyelinating polyneuropathy (HCC)    Acute on chronic respiratory failure with hypoxia (HCC)    Atrial fibrillation with RVR (HCC)    Dysautonomia (H[facility])    Healthcare associated bacterial pneumonia    History reviewed. No pertinent surgical history. Patient Active Problem List   Diagnosis Date Noted   Acute on chronic respiratory failure with hypoxia (HCC)    Acute inflammatory demyelinating polyneuropathy (HCC)    Dysautonomia (HCC)    Atrial fibrillation with RVR (H[facility])    Healthcare associated bacterial pneumonia     REFERRING DIAG: Guillain Barre  THERAPY DIAG:  Other abnormalities of gait and mobility  Muscle weakness (generalized)  PERTINENT HISTORY: Chest pain, dissection of descending thoracic aorta, aneurysm of artery, a-fib, HTN, systolic CHF, oral phase dysphagia, sacral wound, PNA  PRECAUTIONS: Monitor systolic BP: keep between 120-130; fall  SUBJECTIVE: Pt reports that he does not have to go back to wound center anymore as bottom has healed.  PAIN:  Are you having pain? No  TODAY'S TREATMENT:   08/03/21:  BP=118/60, HR=54  Scooting forward in chair with cues to lean forward  and to side and plant feet before sliding opposite hip forward CGA/min assist. In front of mat with it elevated and 14" box with blue pad mat on top: Standing working on upright posture with weight shifting side to side keeping slight bend in knees and pelvis over legs. 1 PT on each leg to prevent buckling and encourage some knee flexion min/mod assist. Coming down slightly and with cues to push bottom back and then back up but not locking knees out x 5. Standing with raising 1 arm out in front at a time x 5 then no UE support 5 sec x 2. Pt took 1 seated rest break between activities. After standing initially BP=130/62. Partial sit to stand with hands sliding forward on elevated mat x 5 then x 3 with PT stabilizing at front of knees but pt doing the partial lift in squat position.  At end of session BP=142/70   08/01/21:  BP=118/60 SciFit x 9 min level 2.8 with BUE and BLE with active hands and gait belt around thighs to keep legs in neutral. PT stabilizing w/c to prevent tipping with blocks behind wheels as well. PT  BP=122/62 after. Completed another 7 min at 3.0. BP=122/62 and HR=64 after. Pt had to lean back between bouts to take some pressure off tailbone. Transfer w/c to/from mat with squat-pivot with pt scooting forwards first in chair one leg at a time. With  transfer pt cued to lean forward to put weight through legs. Pt able to shift weight forward and assist with lateral movement once up min assist.  07/30/2021:   SciFit: set up on SciFit to perform endurance first with UE and LE at level 2.6 resistance but increased time to 8:45 with active hands utilized to keep hands in place and belt around LE to maintain neutral hip alignment. PT stabilized w/c to prevent tipping.  Lowered UE and increased resistance to 3.0 x 3 minutes with LE only to focus on alternating, reciprocal LE extension.  BP after SCI FIT: 137/55; HR: 62.   Transitioned to seated in front of elevated mat; placed dysom on mat to  provide traction for UE on mat and prevent sliding. Performed multiple transitions from sit > squat with elbows supported on mat for WB and stabilization through elbows and shoulders while PT provided cues to extend through elbows to bring trunk upright, to soften knees and keep COG over BOS without shifting too far forwards or backwards.  Progressed to weight shifting laterally in squat position and attempting to side step each LE  out and then back under COG.  Utilized +2 to perform; able to perform weight shift over LLE and advance RLE slightly out to the side and back in.  Required multiple attempts and max-total A to lift and side step LLE out to the side and back in; when attempting to lift LLE pt would experience flexion moment on RLE.    Still requires intermittent rest breaks with wheelchair tilted backwards to remove pressure from coccyx.     07/25/2021: BP at beginning of session=120/62 sitting in left arm. Performed standing in front of mat with table in front with 4 bean bags on it. 3 PT assist with 1 on each side to help with knee control to prevent buckling and encourage some flexion to not lock out knees and 1 PT posterior to prevent too much trunk excursion. First worked on finding upright position over legs with cues to unlock knees slightly and shift hips to left some. Moved bean bags to each side of table with each hand x 2. Then rested and repeated with 2 different level surfaces to move bean bags. Pts as legs min assist on sides and 3rd PT mostly CGA at trunk. 3rd bout of standing with mirror in front with only 2 PT assist working on upright posture with slight bend in knees with partial squats x 3. Sit to stand from mat min assist of 2. Squat-scoot transfer mat to w/c going to right min assist with cues to lean forward and put weight through legs.    BP after standing 128/66 Seated alternating hip flexion working on control with mirror in front for visual cues x 10, SAQ and heel slide  x 10. Cues for slow, controlled movements. Pt performed SciFit with BLE and BUE with active hands utilized to keep hands in place and belt around LE to maintain neutral hip alignment. PT stabilized w/c to prevent tipping. Verbal cues to push with one leg at a time to get started to help dissociate movements. SciFit x 5 min level 3.0. Pt reported some pain in hips, especially left at end that resided after repositioned. BP after SciFit=138/66   HOME EXERCISE PROGRAM:  No changes to HEP today    PT Short Term Goals - 07/25/21 1539       PT SHORT TERM GOAL #1   Title = LTG for 30 days 1/22,  60 days 2/21, 90 days 3/23              PT Long Term Goals - 07/25/21 1539       PT LONG TERM GOAL #1   Title Pt and wife will demonstrate independence with progressed HEP (LTG set at 30 days - reset to 60 days 2/21 and 90 days 3/23)    Baseline 07/16/21 Pt continues to perform HEP w/ updates as necessary.    Time 4    Period Weeks    Status Revised    Target Date 08/16/21      PT LONG TERM GOAL #2   Title Pt will demonstrate ability to perform supine <> sit on flat mat with min A    Baseline 07/16/21 Pt performs si<>supine w/ ModA    Time 4    Period Weeks    Status Revised    Target Date 08/16/21      PT LONG TERM GOAL #3   Title Pt will perform squat-scooting transfers w/c <> mat consistently to L and R on level surface with min A    Baseline mod A squat pivots. 07/25/21 min assist squat-scooting mat to w/c going to right    Time 4    Period Weeks    Status Revised    Target Date 08/16/21      PT LONG TERM GOAL #4   Title Pt will be able to perform sit to/from stand min assist of 1 person.    Baseline 07/16/21 pt is Mod A for sit <>stand with second person for safety. 07/25/21 min of 2 people    Time 4    Period Weeks    Status Revised    Target Date 08/16/21      PT LONG TERM GOAL #5   Title Pt will be able to maintain standing >5 min mod assist of 1 person with appropriate  braces on legs for improved balance and functional strength.    Baseline 07/16/21: still requires multiple therapist assistance to maintain standing >5 minutes    Time 4    Period Weeks    Status Revised    Target Date 08/16/21              Plan - 07/25/21 1542       Clinical Impression Statement Pt's BP had good response to activities today. Continued to focus on controlled movements in legs with keeping slight bend in knees in standing with decreasing UE support as went on.    Personal Factors and Comorbidities Comorbidity 3+;Past/Current Experience;Time since onset of injury/illness/exacerbation     Comorbidities Chest pain, dissection of descending thoracic aorta, aneurysm of artery, a-fib, HTN, systolic CHF, oral phase dysphagia, sacral wound, PNA     Examination-Activity Limitations Bed Mobility;Bend;Locomotion Level;Sit;Stairs;Stand;Transfers;Lift;Hygiene/Grooming;Dressing;Toileting;Self Feeding;Reach Overhead;Bathing     Examination-Participation Restrictions Community Activity;Driving;Yard Work     Conservation officer, historic buildingstability/Clinical Decision Making Evolving/Moderate complexity     Clinical Decision Making Moderate     Rehab Potential Good     PT Frequency 3x / week     PT Duration 12 weeks     PT Treatment/Interventions ADLs/Self Care Home Management;DME Instruction;Gait training;Stair training;Functional mobility training;Therapeutic activities;Therapeutic exercise;Balance training;Neuromuscular re-education;Wheelchair mobility training;Orthotic Fit/Training;Patient/family education;Energy conservation     PT Next Visit Plan   MONITOR BP: Keep SBP between 120-130! Be sure not dropping as well. SciFit with LE only, 3.0 resistance.  Continue to work on squat/pivot transfers to higher surfaces to eventually perform car transfer. Need 27"  height with 7" depth for car. Pt reported bed height was also 27". Continue with use of elevated mat coming to squat to allow WB through forearms with weight  shifting trying to keep soft knees if have 2nd person.  If +2 can also go into // bars and have him work on sit > stand - dysom on bars for grip, hand over hand to maintain hand position - may need to wear AFO and knee cage to prevent knee from locking into extension.  Left anterior [place] AFO and right anterior ottobock that has medial post was most comfortable for standing.  Continue to work on grading movements with sit <> supine, rolling, prone > quadruped.      Consulted and Agree with Plan of Care Patient    Elmer Bales PT, DPT, NCS 08/03/21    4:01 PM

## 2021-08-03 NOTE — Therapy (Signed)
[facility] [address] [place], [place], [address] Phone: [phone]   Fax:  [phone]  Occupational Therapy Treatment & Progress Note  Patient Details  Name: Brian Espinoza MRN: [id number] Date of Birth: [birth year] Referring Provider (OT): Aundra Dubin (Will send certification to PCP [place])   Encounter Date: 08/03/2021   OT End of Session - 08/03/21 1401     Visit Number 40    Number of Visits 54    Date for OT Re-Evaluation 09/18/21    Authorization Type Medicare and Federal BCBS    Progress Note Due on Visit 40    OT Start Time 1400    OT Stop Time 1445    OT Time Calculation (min) 45 min    Activity Tolerance Patient tolerated treatment well    Behavior During Therapy WFL for tasks assessed/performed             Past Medical History:  Diagnosis Date   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  There were no vitals filed for this visit.   Subjective Assessment - 08/03/21 1401     Subjective  Pt denies any pain reports feeling good without changes.    Currently in Pain? No/denies    Pain Score 0-No pain                          OT Treatments/Exercises (OP) - 08/03/21 1448       ADLs   Toileting showed patient drop arm bedside commode and discussed transferring and procedure/technique. Pt optimistic about possibilities of getting in shower at some point and transferring to a [facility] for toileting needs in the future.    Functional Mobility transferred to tub bench on right with min A for boosting assistance for getting over hump and verbal and visual/tactile cues for hand placement and technique.      Neurological Re-education Exercises    Other Exercises 1 wheelchair pushups with mod/max assistance for managing BLE. x 10 reps.                      OT Short Term Goals - 07/18/21 1609       OT SHORT TERM GOAL #1   Title Patient and caregiver will complete a home exercise program designed to improve strength in proximal UE's - shoulders and elbows    Time 4    Period Weeks    Status Achieved   issued AROM exercises and gentle theraband tricep strengthening 04/30/21   Target Date 04/21/21      OT SHORT TERM GOAL #2   Title Following set up patient will hold a cup with lid and long straw in midline to allow him to take a sip on his own    Time 4    Period Weeks    Status Achieved   pt is using [employer] with set up assistance. 07/09/20     OT SHORT TERM GOAL #3   Title While seated in upright supported position, patient will reach forward to make contact with upright target on table top (chest height) x 5 each arm    Time 4    Period Weeks  Status Achieved      OT SHORT TERM GOAL #4   Title Patient will be able to reach toward face with either right or left hand in preparation to scratch chin/cheek    Time 4    Period Weeks    Status Achieved      OT SHORT TERM GOAL #5   Title Patient will utilize cylindrical grip to hold small water bottle in one hand.    Time 4    Period Weeks    Status Partially Met   demonstrated holding bottle in RUE however not consistently 04/30/21              OT Long Term Goals - 08/03/21 1404       OT LONG TERM GOAL #1   Title Patient will complete an updated stretching program to prevent contracture in digits    Time 12    Period Weeks    Status Achieved      OT LONG TERM GOAL #2   Title Patient and caregiver will demonstrate awareness of splint wear and care for BUE    Time 12    Period Weeks    Status Achieved   continue to modifiy and address PRN     OT LONG TERM GOAL #3   Title Patient will wash his face and upper body and upper legs with min assist     Time 12    Period Weeks    Status Achieved   pt reports doing this and then wife going behind to finish 08/03/21     OT Bond #4   Title Patient will actively bridge in bed to assist with pericare as needed    Time 12    Period Weeks    Status Achieved   pt is bridging well 08/03/21     OT LONG TERM GOAL #5   Title Patient will transfer to adapted commode with max assist in prep for toileting    Time 12    Period Weeks    Status On-going   working on troubleshooting [facility] and transfers for continuing to progess towards this goal 08/03/21     OT LONG TERM GOAL #6   Title Patient will transfer into/ out of shower with mod assist, and min assist for seated control during shower    Time 12    Period Weeks    Status On-going   has not attempted showers yet but is progressing with trunk control and sitting balance significantly. Troubleshooting with transfers for getting in and out of bathroom and on tub bench.     OT LONG TERM GOAL #7   Title Patient will feed himself 30% meal using utensils after set up    Time 12    Period Weeks    Status On-going   pt is not using utensils and hesitatnt with using universal cuff with utensils at this time. Pt is eating finger foods with increased independence 08/03/21   Target Date 09/18/21      OT LONG TERM GOAL #8   Title Patient will assist with pulling pants over hips (up/down) to aide with toileting    Time 12    Period Weeks    Status On-going   pt reports pulling pants up pants while bridging  - will continue to trial in the clinic 08/03/21   Target Date 09/18/21  Plan - 08/03/21 1526     Clinical Impression Statement This note serves as the 40th visit progress note for this patient. This patient has progressed with his goals and has demonstrated increased strength for increasing functional mobility and independence with ADLs. Pt would benefit from continued skilled occupational therapy to target listed  areas of deficit and increasing independence with ADLs and IADLs.    OT Occupational Profile and History Comprehensive Assessment- Review of records and extensive additional review of physical, cognitive, psychosocial history related to current functional performance    Occupational performance deficits (Please refer to evaluation for details): ADL's;IADL's;Rest and Sleep;Leisure    Body Structure / Function / Physical Skills ADL;Decreased knowledge of use of DME;Strength;Balance;Dexterity;GMC;Pain;Tone;Body mechanics;Proprioception;UE functional use;Cardiopulmonary status limiting activity;Endurance;IADL;ROM;Fascial restriction;Improper spinal/pelvic alignment;Coordination;Flexibility;Mobility;Sensation;Wound;FMC;Muscle spasms;Skin integrity    Rehab Potential Good    Clinical Decision Making Multiple treatment options, significant modification of task necessary    Comorbidities Affecting Occupational Performance: Presence of comorbidities impacting occupational performance    Comorbidities impacting occupational performance description: AIDP, Chronic back pain, Descending thoracic aortic anneurysm    Modification or Assistance to Complete Evaluation  Min-Moderate modification of tasks or assist with assess necessary to complete eval    OT Frequency 3x / week    OT Duration 12 weeks    OT Treatment/Interventions Self-care/ADL training;Moist Heat;Fluidtherapy;DME and/or AE instruction;Splinting;Balance training;Aquatic Therapy;Contrast Bath;Therapeutic activities;Ultrasound;Therapeutic exercise;Passive range of motion;Functional Mobility Training;Neuromuscular education;Electrical Stimulation;Manual Therapy;Patient/family education;Energy conservation    Plan equalizer with active hands, arm bike with active hands, grasp and release and coordination with functional tasks with BUE, monitor blood pressure    Consulted and Agree with Plan of Care Patient             Patient will benefit from  skilled therapeutic intervention in order to improve the following deficits and impairments:   Body Structure / Function / Physical Skills: ADL, Decreased knowledge of use of DME, Strength, Balance, Dexterity, GMC, Pain, Tone, Body mechanics, Proprioception, UE functional use, Cardiopulmonary status limiting activity, Endurance, IADL, ROM, Fascial restriction, Improper spinal/pelvic alignment, Coordination, Flexibility, Mobility, Sensation, Wound, FMC, Muscle spasms, Skin integrity       Visit Diagnosis: Other abnormalities of gait and mobility  Muscle weakness (generalized)  Other lack of coordination  Unsteadiness on feet  Stiffness of left hand, not elsewhere classified  Stiffness of right hand, not elsewhere classified  Other disturbances of skin sensation    Problem List Patient Active Problem List   Diagnosis Date Noted   Acute on chronic respiratory failure with hypoxia (HCC)    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 [place], [place], [address] Phone: [phone]   Fax:  [phone]  Name: Brian Espinoza MRN: [id number] Date of Birth: 1956/04/21

## 2021-08-08 ENCOUNTER — Ambulatory Visit: Payer: Medicare Other | Admitting: Occupational Therapy

## 2021-08-08 ENCOUNTER — Ambulatory Visit: Payer: Medicare Other

## 2021-08-08 ENCOUNTER — Encounter: Payer: Self-pay | Admitting: Occupational Therapy

## 2021-08-08 ENCOUNTER — Other Ambulatory Visit: Payer: Self-pay

## 2021-08-08 VITALS — BP 108/70

## 2021-08-08 DIAGNOSIS — M25641 Stiffness of right hand, not elsewhere classified: Secondary | ICD-10-CM | POA: Diagnosis not present

## 2021-08-08 DIAGNOSIS — R278 Other lack of coordination: Secondary | ICD-10-CM

## 2021-08-08 DIAGNOSIS — R2681 Unsteadiness on feet: Secondary | ICD-10-CM

## 2021-08-08 DIAGNOSIS — R2689 Other abnormalities of gait and mobility: Secondary | ICD-10-CM

## 2021-08-08 DIAGNOSIS — R208 Other disturbances of skin sensation: Secondary | ICD-10-CM

## 2021-08-08 DIAGNOSIS — M6281 Muscle weakness (generalized): Secondary | ICD-10-CM

## 2021-08-08 DIAGNOSIS — M25642 Stiffness of left hand, not elsewhere classified: Secondary | ICD-10-CM

## 2021-08-08 DIAGNOSIS — R293 Abnormal posture: Secondary | ICD-10-CM

## 2021-08-08 NOTE — Therapy (Signed)
[facility] [place] ?[place], [place], [address] ?Phone: [phone]   Fax:  [phone] ? ?Occupational Therapy Treatment ? ?Patient Details  ?Name: Brian Espinoza ?MRN: [id number] ?Date of Birth: 08-10-1955 ?Referring Provider (OT): Aundra Dubin (Will send certification to PCP [place]) ? ? ?Encounter Date: 08/08/2021 ? ? OT End of Session - 08/08/21 1642   ? ? Visit Number 41   ? Number of Visits 61   ? Date for OT Re-Evaluation 09/18/21   ? Authorization Type Medicare and Federal BCBS   ? Progress Note Due on Visit 50   ? OT Start Time 1530   ? OT Stop Time 1615   ? OT Time Calculation (min) 45 min   ? Equipment Utilized During Treatment drop arm commode   ? Activity Tolerance Patient tolerated treatment well   ? ?  ?  ? ?  ? ? ?Past Medical History:  ?Diagnosis Date  ? Acute inflammatory demyelinating polyneuropathy ([facility])   ? Acute on chronic respiratory failure with hypoxia ([facility])   ? Atrial fibrillation with RVR ([place])   ? Dysautonomia ([place])   ? Healthcare associated bacterial pneumonia   ? ? ?History reviewed. No pertinent surgical history. ? ?Vitals:  ? 08/08/21 1536  ?BP: 108/70  ? ? ? Subjective Assessment - 08/08/21 1630   ? ? Subjective  Patient denies pain.   ? Patient is accompanied by: Family member   ? Currently in Pain? No/denies   ? Pain Score 0-No pain   ? ?  ?  ? ?  ? ? ? ? ? ? ? ? ? ? ? ? ? ? ? OT Treatments/Exercises (OP) - 08/08/21 0001   ? ?  ? ADLs  ? UB Dressing Patient able to don long sleeved shirt while seated without support with increased time and min assist.  Encouraged patient to continue to practice at home - and to time himself to be able to judge improvement.   ? Toileting Patient transferred to drop arm commode with mod assist to keep left leg in contact with floor, and keep right hand in contact with arm of chair.  Patient able to do multiple squats - without use of slide board.  Patient  able to control most of transfer with only support of these two limbs.  Patient unable to tolerate sitting on commode (bariatric drop arm commode) for long due to pain.   ? Driving Patient asking about a lift for car transfer - researched multiple options.  Patient to investigate further to determine best options.   ? ?  ?  ? ?  ? ? ? ? ? ? ? ? ? ? ? OT Short Term Goals - 08/08/21 1644   ? ?  ? OT SHORT TERM GOAL #1  ? Title Patient and caregiver will complete a home exercise program designed to improve strength in proximal UE's - shoulders and elbows   ? Time 4   ? Period Weeks   ? Status Achieved   issued AROM exercises and gentle theraband tricep strengthening 04/30/21  ? Target Date 04/21/21   ?  ? OT SHORT TERM GOAL #2  ? Title Following set up patient will hold a cup with lid and long straw in midline to allow him to take a sip on his own   ? Time 4   ? Period Weeks   ? Status Achieved   pt is using [employer]  with set up assistance. 07/09/20  ?  ? OT SHORT TERM GOAL #3  ? Title While seated in upright supported position, patient will reach forward to make contact with upright target on table top (chest height) x 5 each arm   ? Time 4   ? Period Weeks   ? Status Achieved   ?  ? OT SHORT TERM GOAL #4  ? Title Patient will be able to reach toward face with either right or left hand in preparation to scratch chin/cheek   ? Time 4   ? Period Weeks   ? Status Achieved   ?  ? OT SHORT TERM GOAL #5  ? Title Patient will utilize cylindrical grip to hold small water bottle in one hand.   ? Time 4   ? Period Weeks   ? Status Partially Met   demonstrated holding bottle in RUE however not consistently 04/30/21  ? ?  ?  ? ?  ? ? ? ? OT Long Term Goals - 08/08/21 1644   ? ?  ? OT LONG TERM GOAL #1  ? Title Patient will complete an updated stretching program to prevent contracture in digits   ? Time 12   ? Period Weeks   ? Status Achieved   ?  ? OT LONG TERM GOAL #2  ? Title Patient and caregiver will demonstrate awareness of  splint wear and care for BUE   ? Time 12   ? Period Weeks   ? Status Achieved   continue to modifiy and address PRN  ?  ? OT LONG TERM GOAL #3  ? Title Patient will wash his face and upper body and upper legs with min assist   ? Time 12   ? Period Weeks   ? Status Achieved   pt reports doing this and then wife going behind to finish 08/03/21  ?  ? OT LONG TERM GOAL #4  ? Title Patient will actively bridge in bed to assist with pericare as needed   ? Time 12   ? Period Weeks   ? Status Achieved   pt is bridging well 08/03/21  ?  ? OT LONG TERM GOAL #5  ? Title Patient will transfer to adapted commode with max assist in prep for toileting   ? Time 12   ? Period Weeks   ? Status On-going   working on troubleshooting [facility] and transfers for continuing to progess towards this goal 08/03/21  ?  ? OT LONG TERM GOAL #6  ? Title Patient will transfer into/ out of shower with mod assist, and min assist for seated control during shower   ? Time 12   ? Period Weeks   ? Status On-going   has not attempted showers yet but is progressing with trunk control and sitting balance significantly. Troubleshooting with transfers for getting in and out of bathroom and on tub bench.  ?  ? OT LONG TERM GOAL #7  ? Title Patient will feed himself 30% meal using utensils after set up   ? Time 12   ? Period Weeks   ? Status On-going   pt is not using utensils and hesitatnt with using universal cuff with utensils at this time. Pt is eating finger foods with increased independence 08/03/21  ? Target Date 09/18/21   ?  ? OT LONG TERM GOAL #8  ? Title Patient will assist with pulling pants over hips (up/down) to aide with toileting   ?  Time 12   ? Period Weeks   ? Status On-going   pt reports pulling pants up pants while bridging  - will continue to trial in the clinic 08/03/21  ? Target Date 09/18/21   ? ?  ?  ? ?  ? ? ? ? ? ? ? ? Plan - 08/08/21 1642   ? ? Clinical Impression Statement Patient continues to show steady progress in functional mobility and  participation in ADL's due to improved sitting balance, improved UE/LE strength, improved overall endurance.  Patient eager for improved functional performance and use of BUE   ? OT Occupational Profile and History Comprehensive Assessment- Review of records and extensive additional review of physical, cognitive, psychosocial history related to current functional performance   ? Occupational performance deficits (Please refer to evaluation for details): ADL's;IADL's;Rest and Sleep;Leisure   ? Body Structure / Function / Physical Skills ADL;Decreased knowledge of use of DME;Strength;Balance;Dexterity;GMC;Pain;Tone;Body mechanics;Proprioception;UE functional use;Cardiopulmonary status limiting activity;Endurance;IADL;ROM;Fascial restriction;Improper spinal/pelvic alignment;Coordination;Flexibility;Mobility;Sensation;Wound;FMC;Muscle spasms;Skin integrity   ? Rehab Potential Good   ? Clinical Decision Making Multiple treatment options, significant modification of task necessary   ? Comorbidities Affecting Occupational Performance: Presence of comorbidities impacting occupational performance   ? Comorbidities impacting occupational performance description: AIDP, Chronic back pain, Descending thoracic aortic anneurysm   ? Modification or Assistance to Complete Evaluation  Min-Moderate modification of tasks or assist with assess necessary to complete eval   ? OT Frequency 3x / week   ? OT Duration 12 weeks   ? OT Treatment/Interventions Self-care/ADL training;Moist Heat;Fluidtherapy;DME and/or AE instruction;Splinting;Balance training;Aquatic Therapy;Contrast Bath;Therapeutic activities;Ultrasound;Therapeutic exercise;Passive range of motion;Functional Mobility Training;Neuromuscular education;Electrical Stimulation;Manual Therapy;Patient/family education;Energy conservation   ? Plan equalizer with active hands, arm bike with active hands, grasp and release and coordination with functional tasks with BUE, monitor blood  pressure   ? Consulted and Agree with Plan of Care Patient   ? ?  ?  ? ?  ? ? ?Patient will benefit from skilled therapeutic intervention in order to improve the following deficits and impairments:   ?Body

## 2021-08-08 NOTE — Therapy (Signed)
?OUTPATIENT PHYSICAL THERAPY TREATMENT NOTE ? ? ?Patient Name: Brian Espinoza ?MRN: [id number] ?DOB:Nov 23, 1955, 66 y.o., male ?Today's Date: 08/08/2021 ? ?PCP: Elana Alm, MD ?REFERRING PROVIDER: Alison Murray ? ? PT End of Session - 08/08/21 1447   ? ? Visit Number 44   ? Number of Visits 52   ? Date for PT Re-Evaluation 08/16/21   ? Authorization Type Medicare A&B; [employer] - 10th visit PN   ? Authorization Time Period KX MODIFIER REQUIRED NOW   ? Progress Note Due on Visit 50   ? PT Start Time 1445   ? PT Stop Time 1530   ? PT Time Calculation (min) 45 min   ? Equipment Utilized During Treatment Gait belt   ? Activity Tolerance Patient tolerated treatment well   ? Behavior During Therapy [facility] for tasks assessed/performed   ? ?  ?  ? ?  ? ? ? ?Past Medical History:  ?Diagnosis Date  ? Acute inflammatory demyelinating polyneuropathy (HCC)   ? Acute on chronic respiratory failure with hypoxia (HCC)   ? Atrial fibrillation with RVR (HCC)   ? Dysautonomia (HCC)   ? Healthcare associated bacterial pneumonia   ? ?History reviewed. No pertinent surgical history. ?Patient Active Problem List  ? Diagnosis Date Noted  ? Acute on chronic respiratory failure with hypoxia (HCC)   ? Acute inflammatory demyelinating polyneuropathy (HCC)   ? Dysautonomia (HCC)   ? Atrial fibrillation with RVR (HCC)   ? Healthcare associated bacterial pneumonia   ? ? ?REFERRING DIAG: Guillain Barre ? ?THERAPY DIAG:  ?Other abnormalities of gait and mobility ? ?Muscle weakness (generalized) ? ?Unsteadiness on feet ? ?PERTINENT HISTORY: Chest pain, dissection of descending thoracic aorta, aneurysm of artery, a-fib, HTN, systolic CHF, oral phase dysphagia, sacral wound, PNA ? ?PRECAUTIONS: Monitor systolic BP: keep between 120-130; fall ? ?SUBJECTIVE: Pt reports he is doing ok. Got in recliner chair for first time. Went well getting in chair with slideboard down but tried to go without slideboard back and ended up on the floor. Was not injured. Used Hoyer  lift to get back up. ? ?PAIN:  ?Are you having pain? No ? ?TODAY'S TREATMENT:  ? 08/08/21:  ? BP=108/60 ?Squat pivot transfers to 25" mat from w/c with slideboard for bridge x 2. Had pt scoot forward in chair with cues to lean forward and then shift one hip at a time CGA. Then cues to lean forward keeping legs bend under hip to scoot for transfer. Stopped half way on board then had pt reposition his right leg as had slid forward. Needed max assist for safety in front during this but was able to slide leg back on his own. Needed cues to slow down and not just try to power over as causes him to go back in extension. On second try, PT only provided assist blocking in front with making pt perform the whole movement mod assist. OT behind pt to stabilize slideboard for safety. With return to w/c down hill pt was min assist from front with 1 scoot to move. Pt reports some pain in posterior legs from pushing on leg rest pole. Feels zinging when metal touches his leg. ?Standing in front of mirror x 3 with 1 therapist on each side blocking knees: worked on trying to keep slight bend in knees and then maintaining upright with slight weight shift side to side for first 2 bouts then coming down slightly and back up the last bout with maintaining with slight bend in  knees. Quads fatigued quickly with maintaining slight bend and did start to rely on the therapist's to block knees to prevent buckling. ? ? 08/03/21: ? BP=118/60, HR=54 ? Scooting forward in chair with cues to lean forward and to side and plant feet before sliding opposite hip forward CGA/min assist. ?In front of mat with it elevated and 14" box with blue pad mat on top: Standing working on upright posture with weight shifting side to side keeping slight bend in knees and pelvis over legs. 1 PT on each leg to prevent buckling and encourage some knee flexion min/mod assist. Coming down slightly and with cues to push bottom back and then back up but not locking knees out  x 5. Standing with raising 1 arm out in front at a time x 5 then no UE support 5 sec x 2. Pt took 1 seated rest break between activities. After standing initially BP=130/62. ?Partial sit to stand with hands sliding forward on elevated mat x 5 then x 3 with PT stabilizing at front of knees but pt doing the partial lift in squat position. ? At end of session BP=142/70 ? ? 08/01/21: ? BP=118/60 ?SciFit x 9 min level 2.8 with BUE and BLE with active hands and gait belt around thighs to keep legs in neutral. PT stabilizing w/c to prevent tipping with blocks behind wheels as well. PT  BP=122/62 after. Completed another 7 min at 3.0. BP=122/62 and HR=64 after. Pt had to lean back between bouts to take some pressure off tailbone. ?Transfer w/c to/from mat with squat-pivot with pt scooting forwards first in chair one leg at a time. With transfer pt cued to lean forward to put weight through legs. Pt able to shift weight forward and assist with lateral movement once up min assist. ? ?07/30/2021:   ?SciFit: set up on SciFit to perform endurance first with UE and LE at level 2.6 resistance but increased time to 8:45 with active hands utilized to keep hands in place and belt around LE to maintain neutral hip alignment. PT stabilized w/c to prevent tipping.  Lowered UE and increased resistance to 3.0 x 3 minutes with LE only to focus on alternating, reciprocal LE extension.  BP after SCI FIT: 137/55; HR: 62.   ?Transitioned to seated in front of elevated mat; placed dysom on mat to provide traction for UE on mat and prevent sliding. Performed multiple transitions from sit > squat with elbows supported on mat for WB and stabilization through elbows and shoulders while PT provided cues to extend through elbows to bring trunk upright, to soften knees and keep COG over BOS without shifting too far forwards or backwards.  Progressed to weight shifting laterally in squat position and attempting to side step each LE  out and then back under  COG.  Utilized +2 to perform; able to perform weight shift over LLE and advance RLE slightly out to the side and back in.  Required multiple attempts and max-total A to lift and side step LLE out to the side and back in; when attempting to lift LLE pt would experience flexion moment on RLE.   ? ?Still requires intermittent rest breaks with wheelchair tilted backwards to remove pressure from coccyx.   ? ? ?07/25/2021: ?BP at beginning of session=120/62 sitting in left arm. ?Performed standing in front of mat with table in front with 4 bean bags on it. 3 PT assist with 1 on each side to help with knee control to prevent buckling and  encourage some flexion to not lock out knees and 1 PT posterior to prevent too much trunk excursion. First worked on finding upright position over legs with cues to unlock knees slightly and shift hips to left some. Moved bean bags to each side of table with each hand x 2. Then rested and repeated with 2 different level surfaces to move bean bags. Pts as legs min assist on sides and 3rd PT mostly CGA at trunk. 3rd bout of standing with mirror in front with only 2 PT assist working on upright posture with slight bend in knees with partial squats x 3. ?Sit to stand from mat min assist of 2. ?Squat-scoot transfer mat to w/c going to right min assist with cues to lean forward and put weight through legs.  ?  BP after standing 128/66 ?Seated alternating hip flexion working on control with mirror in front for visual cues x 10, SAQ and heel slide x 10. Cues for slow, controlled movements. ?Pt performed SciFit with BLE and BUE with active hands utilized to keep hands in place and belt around LE to maintain neutral hip alignment. PT stabilized w/c to prevent tipping. Verbal cues to push with one leg at a time to get started to help dissociate movements. SciFit x 5 min level 3.0. Pt reported some pain in hips, especially left at end that resided after repositioned. ?BP after SciFit=138/66 ? ? ?HOME  EXERCISE PROGRAM: ? No changes to HEP today ? ? ? PT Short Term Goals - 07/25/21 1539   ? ?  ? PT SHORT TERM GOAL #1  ? Title = LTG for 30 days 1/22, 60 days 2/21, 90 days 3/23   ? ?  ?  ? ?  ? ? ? PT L

## 2021-08-10 ENCOUNTER — Other Ambulatory Visit: Payer: Self-pay

## 2021-08-10 ENCOUNTER — Ambulatory Visit: Payer: Medicare Other | Admitting: Physical Therapy

## 2021-08-10 VITALS — BP 106/64 | HR 53

## 2021-08-10 DIAGNOSIS — M25641 Stiffness of right hand, not elsewhere classified: Secondary | ICD-10-CM | POA: Diagnosis not present

## 2021-08-10 DIAGNOSIS — M6281 Muscle weakness (generalized): Secondary | ICD-10-CM

## 2021-08-10 DIAGNOSIS — R208 Other disturbances of skin sensation: Secondary | ICD-10-CM

## 2021-08-10 DIAGNOSIS — R2681 Unsteadiness on feet: Secondary | ICD-10-CM

## 2021-08-10 DIAGNOSIS — R2689 Other abnormalities of gait and mobility: Secondary | ICD-10-CM

## 2021-08-10 DIAGNOSIS — R295 Transient paralysis: Secondary | ICD-10-CM

## 2021-08-10 NOTE — Therapy (Signed)
?OUTPATIENT PHYSICAL THERAPY TREATMENT NOTE ? ? ?Patient Name: Brian Espinoza ?MRN: [id number] ?DOB:1955-10-13, 66 y.o., male ?Today's Date: 08/12/2021 ? ?PCP: Elana Almenaldo, Gary T, MD ?REFERRING PROVIDER: Alison MurrayJames McLean ? ? ? 08/10/21 1408  ?PT Visits / Re-Eval  ?Visit Number 45  ?Number of Visits 52  ?Date for PT Re-Evaluation 08/16/21  ?Authorization  ?Authorization Type Medicare A&B; BCBS - 10th visit PN  ?Authorization Time Period KX MODIFIER REQUIRED NOW  ?Progress Note Due on Visit 50  ?PT Time Calculation  ?PT Start Time 1405  ?PT Stop Time 1445  ?PT Time Calculation (min) 40 min  ?PT - End of Session  ?Equipment Utilized During Treatment Other (comment) ?(body weight support)  ?Activity Tolerance Patient tolerated treatment well  ?Behavior During Therapy [facility] for tasks assessed/performed  ? ? ?Past Medical History:  ?Diagnosis Date  ? Acute inflammatory demyelinating polyneuropathy (HCC)   ? Acute on chronic respiratory failure with hypoxia (HCC)   ? Atrial fibrillation with RVR (HCC)   ? Dysautonomia (HCC)   ? Healthcare associated bacterial pneumonia   ? ?No past surgical history on file. ?Patient Active Problem List  ? Diagnosis Date Noted  ? Acute on chronic respiratory failure with hypoxia (HCC)   ? Acute inflammatory demyelinating polyneuropathy (HCC)   ? Dysautonomia (HCC)   ? Atrial fibrillation with RVR (HCC)   ? Healthcare associated bacterial pneumonia   ? ? ?REFERRING DIAG: Guillain Barre ? ?THERAPY DIAG:  ?Muscle weakness (generalized) ? ?Transient paralysis ? ?Other disturbances of skin sensation ? ?Unsteadiness on feet ? ?Other abnormalities of gait and mobility ? ?PERTINENT HISTORY: Chest pain, dissection of descending thoracic aorta, aneurysm of artery, a-fib, HTN, systolic CHF, oral phase dysphagia, sacral wound, PNA ? ?PRECAUTIONS: Monitor systolic BP: keep between 120-130; fall ? ?SUBJECTIVE: Doing well.  Was very happy to get into recliner at home.  No issues to report. ? ?PAIN:  ?Are you having  pain? No ? ?TODAY'S TREATMENT:  ? 08/10/2021: ? BP = 106/64, HR: 53 bpm; BP after gait training in BWS: 120/63, HR: 77 bpm ? Transfers w/c > mat squat pivot to R side with min-mod A to maintain trunk and COG forwards over feet while pivoting.  Donned BWS harness (large) with pt transitioning to reclined on mat with min A with pt demonstrating significant eccentric abdominal control.  Returned to sitting with mod A.  Performed sit > stand from mat with min A and hooked into BWS; pt holding BWS bars with Active Hands to maintain hand placement on bars; PTs assisted with wrist support.  Performed gait x 10' away from mat with turn to L with +4 PT assistance (two to steer BWS), one PT in front to assist with LE control, weight shifting and prevent knee buckling, PTA behind pt to assist with weight shifting.  Performed one seated rest break and then returned to standing with min-mod A and continued NMR through gait around gym in BWS x 115' with PT in front providing verbal and tactile cues for anterior weight shifting to stance LE and cues for stepping initiation and foot placement.   ?GAIT: ?Gait pattern: step through pattern, decreased ankle dorsiflexion- Right, decreased ankle dorsiflexion- Left, Right foot flat, Left foot flat, genu recurvatum- Right, genu recurvatum- Left, scissoring, and ataxic ?Distance walked: [id number]' ?Assistive device utilized:  BWS with UE bars ?Level of assistance:  +4 ? ? ? ?08/08/21:  ? BP=108/60 ?Squat pivot transfers to 25" mat from w/c with slideboard for bridge x 2. Had  pt scoot forward in chair with cues to lean forward and then shift one hip at a time CGA. Then cues to lean forward keeping legs bend under hip to scoot for transfer. Stopped half way on board then had pt reposition his right leg as had slid forward. Needed max assist for safety in front during this but was able to slide leg back on his own. Needed cues to slow down and not just try to power over as causes him to go back in  extension. On second try, PT only provided assist blocking in front with making pt perform the whole movement mod assist. OT behind pt to stabilize slideboard for safety. With return to w/c down hill pt was min assist from front with 1 scoot to move. Pt reports some pain in posterior legs from pushing on leg rest pole. Feels zinging when metal touches his leg. ?Standing in front of mirror x 3 with 1 therapist on each side blocking knees: worked on trying to keep slight bend in knees and then maintaining upright with slight weight shift side to side for first 2 bouts then coming down slightly and back up the last bout with maintaining with slight bend in knees. Quads fatigued quickly with maintaining slight bend and did start to rely on the therapist's to block knees to prevent buckling. ? ? 08/03/21: ? BP=118/60, HR=54 ? Scooting forward in chair with cues to lean forward and to side and plant feet before sliding opposite hip forward CGA/min assist. ?In front of mat with it elevated and 14" box with blue pad mat on top: Standing working on upright posture with weight shifting side to side keeping slight bend in knees and pelvis over legs. 1 PT on each leg to prevent buckling and encourage some knee flexion min/mod assist. Coming down slightly and with cues to push bottom back and then back up but not locking knees out x 5. Standing with raising 1 arm out in front at a time x 5 then no UE support 5 sec x 2. Pt took 1 seated rest break between activities. After standing initially BP=130/62. ?Partial sit to stand with hands sliding forward on elevated mat x 5 then x 3 with PT stabilizing at front of knees but pt doing the partial lift in squat position. ? At end of session BP=142/70 ? ? 08/01/21: ? BP=118/60 ?SciFit x 9 min level 2.8 with BUE and BLE with active hands and gait belt around thighs to keep legs in neutral. PT stabilizing w/c to prevent tipping with blocks behind wheels as well. PT  BP=122/62 after. Completed  another 7 min at 3.0. BP=122/62 and HR=64 after. Pt had to lean back between bouts to take some pressure off tailbone. ?Transfer w/c to/from mat with squat-pivot with pt scooting forwards first in chair one leg at a time. With transfer pt cued to lean forward to put weight through legs. Pt able to shift weight forward and assist with lateral movement once up min assist. ? ? ? ?HOME EXERCISE PROGRAM: ? No changes to HEP today ? ? ? PT Short Term Goals - 07/25/21 1539   ? ?  ? PT SHORT TERM GOAL #1  ? Title = LTG for 30 days 1/22, 60 days 2/21, 90 days 3/23   ? ?  ?  ? ?  ? ? ? PT Long Term Goals - 07/25/21 1539   ? ?  ? PT LONG TERM GOAL #1  ? Title Pt and wife  will demonstrate independence with progressed HEP (LTG set at 30 days - reset to 60 days 2/21 and 90 days 3/23)   ? Baseline 07/16/21 Pt continues to perform HEP w/ updates as necessary.   ? Time 4   ? Period Weeks   ? Status Revised   ? Target Date 08/16/21   ?  ? PT LONG TERM GOAL #2  ? Title Pt will demonstrate ability to perform supine <> sit on flat mat with min A   ? Baseline 07/16/21 Pt performs si<>supine w/ ModA   ? Time 4   ? Period Weeks   ? Status Revised   ? Target Date 08/16/21   ?  ? PT LONG TERM GOAL #3  ? Title Pt will perform squat-scooting transfers w/c <> mat consistently to L and R on level surface with min A   ? Baseline mod A squat pivots. 07/25/21 min assist squat-scooting mat to w/c going to right   ? Time 4   ? Period Weeks   ? Status Revised   ? Target Date 08/16/21   ?  ? PT LONG TERM GOAL #4  ? Title Pt will be able to perform sit to/from stand min assist of 1 person.   ? Baseline 07/16/21 pt is Mod A for sit <>stand with second person for safety. 07/25/21 min of 2 people   ? Time 4   ? Period Weeks   ? Status Revised   ? Target Date 08/16/21   ?  ? PT LONG TERM GOAL #5  ? Title Pt will be able to maintain standing >5 min mod assist of 1 person with appropriate braces on legs for improved balance and functional strength.   ? Baseline  07/16/21: still requires multiple therapist assistance to maintain standing >5 minutes   ? Time 4   ? Period Weeks   ? Status Revised   ? Target Date 08/16/21   ? ?  ?  ? ?  ? ? ? ?Plan - 07/25/21 1542   ?  ?

## 2021-08-13 ENCOUNTER — Ambulatory Visit: Payer: Medicare Other | Admitting: Physical Therapy

## 2021-08-13 ENCOUNTER — Other Ambulatory Visit: Payer: Self-pay

## 2021-08-13 DIAGNOSIS — R2681 Unsteadiness on feet: Secondary | ICD-10-CM

## 2021-08-13 DIAGNOSIS — M6281 Muscle weakness (generalized): Secondary | ICD-10-CM

## 2021-08-13 DIAGNOSIS — R2689 Other abnormalities of gait and mobility: Secondary | ICD-10-CM

## 2021-08-13 DIAGNOSIS — R208 Other disturbances of skin sensation: Secondary | ICD-10-CM

## 2021-08-13 DIAGNOSIS — R295 Transient paralysis: Secondary | ICD-10-CM

## 2021-08-13 DIAGNOSIS — M25641 Stiffness of right hand, not elsewhere classified: Secondary | ICD-10-CM | POA: Diagnosis not present

## 2021-08-13 NOTE — Therapy (Signed)
?OUTPATIENT PHYSICAL THERAPY TREATMENT NOTE ? ? ?Patient Name: Brian Espinoza ?MRN: [id number] ?DOB:1956-05-07, 66 y.o., male ?Today's Date: 08/13/2021 ? ?PCP: Ellis Parents, MD ?REFERRING PROVIDER: Leandro Reasoner, MD ? ? PT End of Session - 08/13/21 1549   ? ? Visit Number 90   ? Number of Visits 52   ? Date for PT Re-Evaluation 08/16/21   ? Authorization Type Medicare A&B; BCBS - 10th visit PN   ? Authorization Time Period KX MODIFIER REQUIRED NOW   ? Progress Note Due on Visit 50   ? PT Start Time 1545   ? PT Stop Time 1630   ? PT Time Calculation (min) 45 min   ? Equipment Utilized During Treatment --   SARA plus and walking sling  ? Activity Tolerance Patient tolerated treatment well   ? Behavior During Therapy [facility] for tasks assessed/performed   ? ?  ?  ? ?  ? ? ?Past Medical History:  ?Diagnosis Date  ? Acute inflammatory demyelinating polyneuropathy (HCC)   ? Acute on chronic respiratory failure with hypoxia (HCC)   ? Atrial fibrillation with RVR ([place])   ? Dysautonomia ([place])   ? Healthcare associated bacterial pneumonia   ? ?No past surgical history on file. ?Patient Active Problem List  ? Diagnosis Date Noted  ? Acute on chronic respiratory failure with hypoxia (HCC)   ? Acute inflammatory demyelinating polyneuropathy (HCC)   ? Dysautonomia ([place])   ? Atrial fibrillation with RVR ([place])   ? Healthcare associated bacterial pneumonia   ? ? ?REFERRING DIAG: Guillain Barre ? ?THERAPY DIAG:  ?Muscle weakness (generalized) ? ?Transient paralysis ? ?Other disturbances of skin sensation ? ?Unsteadiness on feet ? ?Other abnormalities of gait and mobility ? ?PERTINENT HISTORY: Chest pain, dissection of descending thoracic aorta, aneurysm of artery, a-fib, HTN, systolic CHF, oral phase dysphagia, sacral wound, PNA ? ?PRECAUTIONS: Monitor systolic BP: keep between 120-130; fall ? ?SUBJECTIVE: Pt was very happy when he left on Friday.  Was outside a lot this weekend and got sun burn on his face.   ? ?PAIN:  ?Are you having  pain? No ? ?TODAY'S TREATMENT:  ? 08/13/2021: ?Performed transfer w/c > mat to R squat scooting with pt increasing use of UE to perform.  Cued pt to use UE on arm rest and on mat to assist with push off from surface; PT assisted with maintaining placement of feet on floor and maintaining anterior lean to allow hips to lift and scoot to R.  Performed transition from w/c > mat in 2 scoots with min-mod A from PT.  Donned L Lowanda Foster and R [place] GRAFO with T strap to control ankle supination and inversion.  Pt set up in [employer] plus with walking harness to assess if pt would be able to complete gait training with only two therapists (removed foot plate and knee support).  Performed sit > stand with UE supported on SARA and min A.  Performed gait around gym with Clarise Cruz plus with one therapist guiding SARA and second therapist providing mod-max A at pelvis for weight shifting laterally and anterior over stance LE; PT also provided total verbal cues for weight shifting, initiation of stepping and cues for "soft, small steps" forwards and verbal cues for foot placement due to frequent adduction/scissoring of RLE.  Pt cued to take smaller steps with LLE due to pt landing on heel and locking L knee preventing anterior weight shift.  Greater difficulty with keeping COG in midline today due to  tendency of SARA to veer L and R (less stable than BWS).   Returned to sitting in wheelchair at end of session. ?GAIT: ?Gait pattern: step through pattern, decreased ankle dorsiflexion- Right, decreased ankle dorsiflexion- Left, Right foot flat, Left foot flat, genu recurvatum- Right, genu recurvatum- Left, scissoring, and ataxic ?Distance walked: 81' ?Assistive device utilized: Clarise Cruz Plus with walking harness, bilat AFO ?Level of assistance: +2; intermittent +3 ? ? ?08/10/2021: ? BP = 106/64, HR: 53 bpm; BP after gait training in BWS: 120/63, HR: 77 bpm ? Transfers w/c > mat squat pivot to R side with min-mod A to maintain trunk and COG  forwards over feet while pivoting.  Donned BWS harness (large) with pt transitioning to reclined on mat with min A with pt demonstrating significant eccentric abdominal control.  Returned to sitting with mod A.  Performed sit > stand from mat with min A and hooked into BWS; pt holding BWS bars with Active Hands to maintain hand placement on bars; PTs assisted with wrist support.  Performed gait x 10' away from mat with turn to L with +4 PT assistance (two to steer BWS), one PT in front to assist with LE control, weight shifting and prevent knee buckling, PTA behind pt to assist with weight shifting.  Performed one seated rest break and then returned to standing with min-mod A and continued NMR through gait around gym in BWS x 115' with PT in front providing verbal and tactile cues for anterior weight shifting to stance LE and cues for stepping initiation and foot placement.   ?GAIT: ?Gait pattern: step through pattern, decreased ankle dorsiflexion- Right, decreased ankle dorsiflexion- Left, Right foot flat, Left foot flat, genu recurvatum- Right, genu recurvatum- Left, scissoring, and ataxic ?Distance walked: 53' ?Assistive device utilized:  BWS with UE bars ?Level of assistance:  +4 ? ?08/08/21:  ? BP=108/60 ?Squat pivot transfers to 25" mat from w/c with slideboard for bridge x 2. Had pt scoot forward in chair with cues to lean forward and then shift one hip at a time CGA. Then cues to lean forward keeping legs bend under hip to scoot for transfer. Stopped half way on board then had pt reposition his right leg as had slid forward. Needed max assist for safety in front during this but was able to slide leg back on his own. Needed cues to slow down and not just try to power over as causes him to go back in extension. On second try, PT only provided assist blocking in front with making pt perform the whole movement mod assist. OT behind pt to stabilize slideboard for safety. With return to w/c down hill pt was min  assist from front with 1 scoot to move. Pt reports some pain in posterior legs from pushing on leg rest pole. Feels zinging when metal touches his leg. ?Standing in front of mirror x 3 with 1 therapist on each side blocking knees: worked on trying to keep slight bend in knees and then maintaining upright with slight weight shift side to side for first 2 bouts then coming down slightly and back up the last bout with maintaining with slight bend in knees. Quads fatigued quickly with maintaining slight bend and did start to rely on the therapist's to block knees to prevent buckling. ? ? 08/03/21: ? BP=118/60, HR=54 ? Scooting forward in chair with cues to lean forward and to side and plant feet before sliding opposite hip forward CGA/min assist. ?In front of mat with it  elevated and 14" box with blue pad mat on top: Standing working on upright posture with weight shifting side to side keeping slight bend in knees and pelvis over legs. 1 PT on each leg to prevent buckling and encourage some knee flexion min/mod assist. Coming down slightly and with cues to push bottom back and then back up but not locking knees out x 5. Standing with raising 1 arm out in front at a time x 5 then no UE support 5 sec x 2. Pt took 1 seated rest break between activities. After standing initially BP=130/62. ?Partial sit to stand with hands sliding forward on elevated mat x 5 then x 3 with PT stabilizing at front of knees but pt doing the partial lift in squat position. ? At end of session BP=142/70 ? ? ?HOME EXERCISE PROGRAM: ? No changes to HEP today ? ? ? PT Short Term Goals - 07/25/21 1539   ? ?  ? PT SHORT TERM GOAL #1  ? Title = LTG for 30 days 1/22, 60 days 2/21, 90 days 3/23   ? ?  ?  ? ?  ? ? ? PT Long Term Goals - 07/25/21 1539   ? ?  ? PT LONG TERM GOAL #1  ? Title Pt and wife will demonstrate independence with progressed HEP (LTG set at 30 days - reset to 60 days 2/21 and 90 days 3/23)   ? Baseline 07/16/21 Pt continues to perform  HEP w/ updates as necessary.   ? Time 4   ? Period Weeks   ? Status Revised   ? Target Date 08/16/21   ?  ? PT LONG TERM GOAL #2  ? Title Pt will demonstrate ability to perform supine <> sit on flat mat wit

## 2021-08-15 ENCOUNTER — Other Ambulatory Visit: Payer: Self-pay

## 2021-08-15 ENCOUNTER — Ambulatory Visit: Payer: Medicare Other

## 2021-08-15 ENCOUNTER — Ambulatory Visit: Payer: Medicare Other | Admitting: Occupational Therapy

## 2021-08-15 ENCOUNTER — Encounter: Payer: Self-pay | Admitting: Occupational Therapy

## 2021-08-15 DIAGNOSIS — R2689 Other abnormalities of gait and mobility: Secondary | ICD-10-CM

## 2021-08-15 DIAGNOSIS — M6281 Muscle weakness (generalized): Secondary | ICD-10-CM

## 2021-08-15 DIAGNOSIS — R2681 Unsteadiness on feet: Secondary | ICD-10-CM

## 2021-08-15 DIAGNOSIS — M25642 Stiffness of left hand, not elsewhere classified: Secondary | ICD-10-CM

## 2021-08-15 DIAGNOSIS — R208 Other disturbances of skin sensation: Secondary | ICD-10-CM

## 2021-08-15 DIAGNOSIS — M25641 Stiffness of right hand, not elsewhere classified: Secondary | ICD-10-CM

## 2021-08-15 DIAGNOSIS — R293 Abnormal posture: Secondary | ICD-10-CM

## 2021-08-15 DIAGNOSIS — R278 Other lack of coordination: Secondary | ICD-10-CM

## 2021-08-15 NOTE — Patient Instructions (Signed)
Access Code: [email or web address] ?URL: [email or web address] ?Date: 08/15/2021 ?Prepared by: Elmer Bales ? ?Exercises ?Supine Bridge - 1 x daily - 5 x weekly - 2 sets - 10 reps ?Hip Abduction and Adduction Caregiver PROM - 1 x daily - 7 x weekly - 2 sets - 10 reps ?Supine Hip and Knee Flexion PROM with Caregiver - 1 x daily - 7 x weekly - 2 sets - 10 reps ?Hip Internal and External Rotation Caregiver PROM - 2-3 x daily - 7 x weekly - 1 sets - 3 reps - 1 min hold ?Hip Flexion with Knee Extension Caregiver PROM - 2-3 x daily - 7 x weekly - 1 sets - 3 reps - 30 sec-1 minond hold ?Supine Piriformis Stretch with Leg Straight - 2-3 x daily - 7 x weekly - 1 sets - 3 reps - 30 sec-1 minond hold ?Supine March - 1 x daily - 7 x weekly - 3 sets - 10 reps ?Seated March - 1 x daily - 7 x weekly - 2 sets - 10 reps ?Seated Long Arc Quad - 1 x daily - 7 x weekly - 2 sets - 10 reps ?Sit to Stand - 1 x daily - 7 x weekly - 1 sets - 5 reps ? ?

## 2021-08-15 NOTE — Therapy (Signed)
[place] [facility] ?[address] ?[place], [place], [address] ?Phone: [phone]   Fax:  [phone] ? ?Occupational Therapy Treatment ? ?Patient Details  ?Name: Brian Espinoza ?MRN: [id number] ?Date of Birth: 1955/10/28 ?Referring Provider (OT): Shirlee Latch (Will send certification to PCP Venia [facility]) ? ? ?Encounter Date: 08/15/2021 ? ? OT End of Session - 08/15/21 1621   ? ? Visit Number 42   ? Number of Visits 61   ? Date for OT Re-Evaluation 09/18/21   ? Authorization Type Medicare and Federal BCBS   ? Progress Note Due on Visit 50   ? OT Start Time 1400   ? OT Stop Time 1445   ? OT Time Calculation (min) 45 min   ? Equipment Utilized During Treatment drop arm commode   ? Activity Tolerance Patient tolerated treatment well   ? Behavior During Therapy [facility] for tasks assessed/performed   ? ?  ?  ? ?  ? ? ?Past Medical History:  ?Diagnosis Date  ? Acute inflammatory demyelinating polyneuropathy (HCC)   ? Acute on chronic respiratory failure with hypoxia (HCC)   ? Atrial fibrillation with RVR (HCC)   ? Dysautonomia (HCC)   ? Healthcare associated bacterial pneumonia   ? ? ?History reviewed. No pertinent surgical history. ? ?There were no vitals filed for this visit. ? ? ? ? ? ? ? ? ? ? ? ? ? ? ? ? OT Treatments/Exercises (OP) - 08/15/21 0001   ? ?  ? ADLs  ? Toileting Patient transferred to drop arm bariatric commode from wheelchair with min assist using multiple squats.  Min assist to maintain left foot in contact with floor.  Worked on sit to stand at [employer] in preparation for Electrical engineer.  Patient able to stand with min assist, and then needed two person assistance for safety.  Worked on static to slightly dynamic stand balance with BUE and Single UE support.  Worked on attempting to unlock and replace the drop arm for commode.  Patient needed cueing to scoot forward and maintain feet on floor before attempting to  stand.   ? ?  ?  ? ?  ? ? ? ? ? ? ? ? ? ? ? OT Short Term Goals - 08/15/21 1434   ? ?  ? OT SHORT TERM GOAL #1  ? Title Patient and caregiver will complete a home exercise program designed to improve strength in proximal UE's - shoulders and elbows   ? Time 4   ? Period Weeks   ? Status Achieved   issued AROM exercises and gentle theraband tricep strengthening 04/30/21  ? Target Date 04/21/21   ?  ? OT SHORT TERM GOAL #2  ? Title Following set up patient will hold a cup with lid and long straw in midline to allow him to take a sip on his own   ? Time 4   ? Period Weeks   ? Status Achieved   pt is using [employer] with set up assistance. 07/09/20  ?  ? OT SHORT TERM GOAL #3  ? Title While seated in upright supported position, patient will reach forward to make contact with upright target on table top (chest height) x 5 each arm   ? Time 4   ? Period Weeks   ? Status Achieved   ?  ? OT SHORT TERM GOAL #4  ? Title Patient will be able to reach toward  face with either right or left hand in preparation to scratch chin/cheek   ? Time 4   ? Period Weeks   ? Status Achieved   ?  ? OT SHORT TERM GOAL #5  ? Title Patient will utilize cylindrical grip to hold small water bottle in one hand.   ? Time 4   ? Period Weeks   ? Status Achieved   demonstrated holding bottle in RUE however not consistently 04/30/21  ? ?  ?  ? ?  ? ? ? ? OT Long Term Goals - 08/15/21 1439   ? ?  ? OT LONG TERM GOAL #1  ? Title Patient will complete an updated stretching program to prevent contracture in digits   ? Time 12   ? Period Weeks   ? Status Achieved   ?  ? OT LONG TERM GOAL #2  ? Title Patient and caregiver will demonstrate awareness of splint wear and care for BUE   ? Time 12   ? Period Weeks   ? Status Achieved   continue to modifiy and address PRN  ?  ? OT LONG TERM GOAL #3  ? Title Patient will wash his face and upper body and upper legs with min assist   ? Time 12   ? Period Weeks   ? Status Achieved   pt reports doing this and then wife  going behind to finish 08/03/21  ?  ? OT LONG TERM GOAL #4  ? Title Patient will actively bridge in bed to assist with pericare as needed   ? Time 12   ? Period Weeks   ? Status Achieved   pt is bridging well 08/03/21  ?  ? OT LONG TERM GOAL #5  ? Title Patient will transfer to adapted commode with max assist in prep for toileting   ? Time 12   ? Period Weeks   ? Status Achieved   [facility] and transfers for continuing to progess towards this goal 08/03/21  ?  ? OT LONG TERM GOAL #6  ? Title Patient will transfer into/ out of shower with mod assist, and min assist for seated control during shower   ? Time 12   ? Period Weeks   ? Status On-going   has not attempted showers yet but is progressing with trunk control and sitting balance significantly. Troubleshooting with transfers for getting in and out of bathroom and on tub bench.  ?  ? OT LONG TERM GOAL #7  ? Title Patient will feed himself 30% meal using utensils after set up   ? Time 12   ? Period Weeks   ? Status On-going   pt is not using utensils and hesitatnt with using universal cuff with utensils at this time. Pt is eating finger foods with increased independence 08/03/21  ? Target Date 09/18/21   ?  ? OT LONG TERM GOAL #8  ? Title Patient will assist with pulling pants over hips (up/down) to aide with toileting   ? Time 12   ? Period Weeks   ? Status On-going   pt reports pulling pants up pants while bridging  - will continue to trial in the clinic 08/03/21  ? Target Date 09/18/21   ? ?  ?  ? ?  ? ? ? ? ? ? ? ? Plan - 08/15/21 1621   ? ? Clinical Impression Statement Patient's daughter present for OT treatment this session.  Patient may need  to transition care closer to home (in [place] county) due to transportation issues.   ? OT Occupational Profile and History Comprehensive Assessment- Review of records and extensive additional review of physical, cognitive, psychosocial history related to current functional performance   ? Occupational  performance deficits (Please refer to evaluation for details): ADL's;IADL's;Rest and Sleep;Leisure   ? Body Structure / Function / Physical Skills ADL;Decreased knowledge of use of DME;Strength;Balance;Dexterity;GMC;Pain;Tone;Body mechanics;Proprioception;UE functional use;Cardiopulmonary status limiting activity;Endurance;IADL;ROM;Fascial restriction;Improper spinal/pelvic alignment;Coordination;Flexibility;Mobility;Sensation;Wound;FMC;Muscle spasms;Skin integrity   ? Rehab Potential Good   ? Clinical Decision Making Multiple treatment options, significant modification of task necessary   ? Comorbidities Affecting Occupational Performance: Presence of comorbidities impacting occupational performance   ? Comorbidities impacting occupational performance description: AIDP, Chronic back pain, Descending thoracic aortic anneurysm   ? Modification or Assistance to Complete Evaluation  Min-Moderate modification of tasks or assist with assess necessary to complete eval   ? OT Frequency 3x / week   ? OT Duration 12 weeks   ? OT Treatment/Interventions Self-care/ADL training;Moist Heat;Fluidtherapy;DME and/or AE instruction;Splinting;Balance training;Aquatic Therapy;Contrast Bath;Therapeutic activities;Ultrasound;Therapeutic exercise;Passive range of motion;Functional Mobility Training;Neuromuscular education;Electrical Stimulation;Manual Therapy;Patient/family education;Energy conservation   ? Plan May need to discharge - equalizer with active hands, arm bike with active hands, grasp and release and coordination with functional tasks with BUE, monitor blood pressure   ? Consulted and Agree with Plan of Care Patient   ? Family Member Consulted daughter   ? ?  ?  ? ?  ? ? ?Patient will benefit from skilled therapeutic intervention in order to improve the following deficits and impairments:   ?Body Structure / Function / Physical Skills: ADL, Decreased knowledge of use of DME, Strength, Balance, Dexterity, GMC, Pain, Tone,  Body mechanics, Proprioception, UE functional use, Cardiopulmonary status limiting activity, Endurance, IADL, ROM, Fascial restriction, Improper spinal/pelvic alignment, Coordination, Flexibility, Mobility, Sensatio

## 2021-08-15 NOTE — Therapy (Signed)
?OUTPATIENT PHYSICAL THERAPY TREATMENT NOTE ? ? ?Patient Name: Brian Espinoza ?MRN: [id number] ?DOB:07-08-1955, 66 y.o., male ?Today's Date: 08/15/2021 ? ?PCP: Ellis Parents, MD ?REFERRING PROVIDER: Leandro Reasoner, MD ? ? PT End of Session - 08/15/21 1319   ? ? Visit Number 6   ? Number of Visits 52   ? Date for PT Re-Evaluation 08/16/21   ? Authorization Type Medicare A&B; BCBS - 10th visit PN   ? Authorization Time Period KX MODIFIER REQUIRED NOW   ? Progress Note Due on Visit 50   ? PT Start Time 1315   ? PT Stop Time 1400   ? PT Time Calculation (min) 45 min   ? Equipment Utilized During Treatment Gait belt   ? Activity Tolerance Patient tolerated treatment well   ? Behavior During Therapy [facility] for tasks assessed/performed   ? ?  ?  ? ?  ? ? ?Past Medical History:  ?Diagnosis Date  ? Acute inflammatory demyelinating polyneuropathy (HCC)   ? Acute on chronic respiratory failure with hypoxia (HCC)   ? Atrial fibrillation with RVR ([place])   ? Dysautonomia ([place])   ? Healthcare associated bacterial pneumonia   ? ?History reviewed. No pertinent surgical history. ?Patient Active Problem List  ? Diagnosis Date Noted  ? Acute on chronic respiratory failure with hypoxia (HCC)   ? Acute inflammatory demyelinating polyneuropathy (HCC)   ? Dysautonomia ([place])   ? Atrial fibrillation with RVR ([place])   ? Healthcare associated bacterial pneumonia   ? ? ?REFERRING DIAG: Guillain Barre ? ?THERAPY DIAG:  ?Other abnormalities of gait and mobility ? ?Muscle weakness (generalized) ? ?Unsteadiness on feet ? ?PERTINENT HISTORY: Chest pain, dissection of descending thoracic aorta, aneurysm of artery, a-fib, HTN, systolic CHF, oral phase dysphagia, sacral wound, PNA ? ?PRECAUTIONS: Monitor systolic BP: keep between 120-130; fall ? ?SUBJECTIVE: Pt found out that transportation is ending Friday. They are checking on medicaid transportation but will most likely have to transfer to a clinic in [facility].  ? ?PAIN:  ?Are you having pain?  No ? ?TODAY'S TREATMENT:  ? 08/15/21: ?Scooted forward in chair with PT cuing to lean forward to keep feet on ground and lean to each side bringing one hip forward at a time. Performed squat pivot transfer w/c to mat to left with 2 scoots to get fully to mat. Min/mod assist to maintain feet on floor and cue for anterior lean. Had pt adjust feet half way. Pt performed scooting to left on mat CGA with PT stabilizing feet on floor. ?Sit to supine coming down on left side first with min assist only to clear feet from hooking under mat. Rolling to left with cues to reach across body with right arm. Left sidelying to sit with cues to keep hips bent during movement bringing feet off first then pushing up. Cues to push with left elbow and right hand. Min assist to rise.  ? Reviewed medbridge HEP and updated exercises:  ?Supine Bridge - 1 x daily - 5 x weekly - 1 sets - 10 reps- verbal and tactile cues to prevent hip abduction. ?Hip Abduction and Adduction Caregiver PROM - 1 x daily - 7 x weekly - 2 sets - 10 reps-verbally discussed ?Hip Internal and External Rotation Caregiver PROM - 2-3 x daily - 7 x weekly - 1 sets - 3 reps - 1 min hold- demonstrated on left leg ?Supine Piriformis Stretch with Leg Straight - 2-3 x daily - 7 x weekly - 1 sets -  3 reps - 30 sec-1 min hold- demonstrated on left leg ?Performed seated modified crunch leaning back as far as he could and then coming forward x 5 ?Supine March - 1 x daily - 7 x weekly - 3 sets - 10 reps- verbal cues for slow, controlled movement ?Seated March - 1 x daily - 7 x weekly - 2 sets - 10 reps-verbal cues for slow, controlled movement ?Seated Long Arc Quad - 1 x daily - 7 x weekly - 2 sets - 10 reps- verbal cues for slow, controlled movement ?Sit to Stand - 1 x daily - 7 x weekly - 1 sets - 5 reps- performed with +2 assist with therapist on each side blocking knees and mirror in front for cuing. In standing worked on standing upright and weight shifting side to side x 3  then keeping slight bend in knees prior to sitting down. Daughter observing throughout so that they can attempt at home with +2 assist. ?  ? 08/13/2021: ?Performed transfer w/c > mat to R squat scooting with pt increasing use of UE to perform.  Cued pt to use UE on arm rest and on mat to assist with push off from surface; PT assisted with maintaining placement of feet on floor and maintaining anterior lean to allow hips to lift and scoot to R.  Performed transition from w/c > mat in 2 scoots with min-mod A from PT.  Donned L Lowanda Foster and R [place] GRAFO with T strap to control ankle supination and inversion.  Pt set up in [place] plus with walking harness to assess if pt would be able to complete gait training with only two therapists (removed foot plate and knee support).  Performed sit > stand with UE supported on SARA and min A.  Performed gait around gym with Clarise Cruz plus with one therapist guiding SARA and second therapist providing mod-max A at pelvis for weight shifting laterally and anterior over stance LE; PT also provided total verbal cues for weight shifting, initiation of stepping and cues for "soft, small steps" forwards and verbal cues for foot placement due to frequent adduction/scissoring of RLE.  Pt cued to take smaller steps with LLE due to pt landing on heel and locking L knee preventing anterior weight shift.  Greater difficulty with keeping COG in midline today due to tendency of SARA to veer L and R (less stable than BWS).   Returned to sitting in wheelchair at end of session. ?GAIT: ?Gait pattern: step through pattern, decreased ankle dorsiflexion- Right, decreased ankle dorsiflexion- Left, Right foot flat, Left foot flat, genu recurvatum- Right, genu recurvatum- Left, scissoring, and ataxic ?Distance walked: 59' ?Assistive device utilized: Clarise Cruz Plus with walking harness, bilat AFO ?Level of assistance: +2; intermittent +3 ? ? ? ? ?HOME EXERCISE PROGRAM: ? [id number]. Updated HEP and reviewed.   ? ? ? PT Short Term Goals - 07/25/21 1539   ? ?  ? PT SHORT TERM GOAL #1  ? Title = LTG for 30 days 1/22, 60 days 2/21, 90 days 3/23   ? ?  ?  ? ?  ? ? ? PT Long Term Goals - 07/25/21 1539   ? ?  ? PT LONG TERM GOAL #1  ? Title Pt and wife will demonstrate independence with progressed HEP (LTG set at 30 days - reset to 60 days 2/21 and 90 days 3/23)   ? Baseline 07/16/21 Pt continues to perform HEP w/ updates as necessary. 08/15/21 PT reviewed and updated  with daughter present.  ? Time 4   ? Period Weeks   ? Status Achieved  ? Target Date 08/16/21   ?  ? PT LONG TERM GOAL #2  ? Title Pt will demonstrate ability to perform supine <> sit on flat mat with min A   ? Baseline 07/16/21 Pt performs si<>supine w/ ModA . 08/15/21 min assist with sit to/from supine  ? Time 4   ? Period Weeks   ? Status Achieved  ? Target Date 08/16/21   ?  ? PT LONG TERM GOAL #3  ? Title Pt will perform squat-scooting transfers w/c <> mat consistently to L and R on level surface with min A   ? Baseline Min-mod A for squat pivots/scooting to keep weight shifted forwards over BOS  ? Time 4   ? Period Weeks   ? Status Partially Met  ? Target Date 08/16/21   ?  ? PT LONG TERM GOAL #4  ? Title Pt will be able to perform sit to/from stand min assist of 1 person.   ? Baseline Min-mod A of one person with UE support   ? Time 4   ? Period Weeks   ? Status Partially met  ? Target Date 08/16/21   ?  ? PT LONG TERM GOAL #5  ? Title Pt will be able to maintain standing >5 min mod assist of 1 person with appropriate braces on legs for improved balance and functional strength.   ? Baseline Able to stand >5 minutes with Mod A of two therapists and UE support  ? Time 4   ? Period Weeks   ? Status Partially met  ? Target Date 08/16/21   ? ?  ?  ? ?  ? ? ?Plan - 07/25/21 1542   ?  ?  Clinical Impression Statement PT assessed HEP goal and bed mobility goal. PT updated HEP with daughter present and pt and daughter denied any questions. Pt was able to perform sit  to/from supine min assist today needing PT to clear feet from getting stuck with going to lie down and min assist to rise with sitting up. He is able to perform in his elevated hospital bed to get up on own per h

## 2021-08-17 ENCOUNTER — Other Ambulatory Visit: Payer: Self-pay

## 2021-08-17 ENCOUNTER — Ambulatory Visit: Payer: Medicare Other | Admitting: Physical Therapy

## 2021-08-17 ENCOUNTER — Ambulatory Visit: Payer: Medicare Other | Admitting: Occupational Therapy

## 2021-08-17 VITALS — BP 125/63 | HR 74

## 2021-08-17 DIAGNOSIS — M25642 Stiffness of left hand, not elsewhere classified: Secondary | ICD-10-CM

## 2021-08-17 DIAGNOSIS — R208 Other disturbances of skin sensation: Secondary | ICD-10-CM

## 2021-08-17 DIAGNOSIS — R278 Other lack of coordination: Secondary | ICD-10-CM

## 2021-08-17 DIAGNOSIS — R2681 Unsteadiness on feet: Secondary | ICD-10-CM

## 2021-08-17 DIAGNOSIS — M25641 Stiffness of right hand, not elsewhere classified: Secondary | ICD-10-CM | POA: Diagnosis not present

## 2021-08-17 DIAGNOSIS — M6281 Muscle weakness (generalized): Secondary | ICD-10-CM

## 2021-08-17 DIAGNOSIS — R2689 Other abnormalities of gait and mobility: Secondary | ICD-10-CM

## 2021-08-17 DIAGNOSIS — R295 Transient paralysis: Secondary | ICD-10-CM

## 2021-08-17 NOTE — Therapy (Signed)
[facility] ?[place] ?[place], [place], [address] ?Phone: [phone]   Fax:  [phone] ? ?Occupational Therapy Treatment & Discharge  ? ?Patient Details  ?Name: Brian Espinoza ?MRN: [id number] ?Date of Birth: 03-01-1956 ?Referring Provider (OT): Aundra Dubin (Will send certification to PCP [place]) ? ? ?Encounter Date: 08/17/2021 ? ? OT End of Session - 08/17/21 1318   ? ? Visit Number 5   ? Number of Visits 61   ? Date for OT Re-Evaluation 09/18/21   ? Authorization Type Medicare and Federal BCBS   ? Progress Note Due on Visit 50   ? OT Start Time 1317   ? OT Stop Time 1400   ? OT Time Calculation (min) 43 min   ? Equipment Utilized During Treatment drop arm commode   ? Activity Tolerance Patient tolerated treatment well   ? Behavior During Therapy [facility] for tasks assessed/performed   ? ?  ?  ? ?  ? ? ?OCCUPATIONAL THERAPY DISCHARGE SUMMARY ? ?Visits from Start of Care: 63 ? ?Current functional level related to goals / functional outcomes: ?Pt has made significant progress towards goals nad increased independence with ADLs. Pt started with very limited trunk control and sitting balance and total assistance with hoyer transfers for all ADLs and total assistance for all ADLs. Pt has increased independence with ADLs and trunk control and progressing towards continued independence with ADLs and transfers. ?  ?Remaining deficits: ?Pt remains at a max level for most ADLs and limited strength, proprioception and control. ?  ?Education / Equipment: ?HEPs for ROM and strengthening  ? ?Patient agrees to discharge. Patient goals were partially met. Patient is being discharged due to  lack of transportation after this week - provided transportation is being discontinued and patient will not be able to make it to this facility.. ? ? ? ? ?Past Medical History:  ?Diagnosis Date  ? Acute inflammatory demyelinating polyneuropathy (HCC)   ?  Acute on chronic respiratory failure with hypoxia (HCC)   ? Atrial fibrillation with RVR ([place])   ? Dysautonomia ([place])   ? Healthcare associated bacterial pneumonia   ? ? ?No past surgical history on file. ? ?There were no vitals filed for this visit. ? ? Subjective Assessment - 08/17/21 1318   ? ? Subjective  "Today is my last day"   ? Currently in Pain? No/denies   ? Pain Score 0-No pain   ? ?  ?  ? ?  ? ? ? ? ?Equalizer with active hands on BUE for BUE strengthening for increasing independence and progressing towards ADLs and transfer independence. Pt with increased weight on vertical chest press today and demonstrating significant improvement with strength BUE. ? ?Chest press vertical x 15 lbs x 15 reps each BUE, 20 lbs x 15 reps ?Lat pull downs x 15 lbs BUE x 15 reps x 2 sets  ?Scap Retraction/Row x 15 lbs x 15 reps each BUE x 2 sets  ? ?Arm Bike: for conditioning and reciprocal movements on Level 2 for 8 minutes (forward and backward). Pt increased conditioning and cardiovascular endurance noted.  ? ? ?Splint Modifications to RUE resting hand splint for velcro and strapping d/t splint straps and velcro falling off. ? ? ? ? ? ? ? ? ? ? ? ? ? ? ? ? ? OT Short Term Goals - 08/15/21 1434   ? ?  ? OT SHORT TERM GOAL #1  ? Title Patient  and caregiver will complete a home exercise program designed to improve strength in proximal UE's - shoulders and elbows   ? Time 4   ? Period Weeks   ? Status Achieved   issued AROM exercises and gentle theraband tricep strengthening 04/30/21  ? Target Date 04/21/21   ?  ? OT SHORT TERM GOAL #2  ? Title Following set up patient will hold a cup with lid and long straw in midline to allow him to take a sip on his own   ? Time 4   ? Period Weeks   ? Status Achieved   pt is using [place] with set up assistance. 07/09/20  ?  ? OT SHORT TERM GOAL #3  ? Title While seated in upright supported position, patient will reach forward to make contact with upright target on table top (chest  height) x 5 each arm   ? Time 4   ? Period Weeks   ? Status Achieved   ?  ? OT SHORT TERM GOAL #4  ? Title Patient will be able to reach toward face with either right or left hand in preparation to scratch chin/cheek   ? Time 4   ? Period Weeks   ? Status Achieved   ?  ? OT SHORT TERM GOAL #5  ? Title Patient will utilize cylindrical grip to hold small water bottle in one hand.   ? Time 4   ? Period Weeks   ? Status Achieved   demonstrated holding bottle in RUE however not consistently 04/30/21  ? ?  ?  ? ?  ? ? ? ? OT Long Term Goals - 08/17/21 1341   ? ?  ? OT LONG TERM GOAL #1  ? Title Patient will complete an updated stretching program to prevent contracture in digits   ? Time 12   ? Period Weeks   ? Status Achieved   ?  ? OT LONG TERM GOAL #2  ? Title Patient and caregiver will demonstrate awareness of splint wear and care for BUE   ? Time 12   ? Period Weeks   ? Status Achieved   continue to modifiy and address PRN  ?  ? OT LONG TERM GOAL #3  ? Title Patient will wash his face and upper body and upper legs with min assist   ? Time 12   ? Period Weeks   ? Status Achieved   pt reports doing this and then wife going behind to finish 08/03/21  ?  ? OT LONG TERM GOAL #4  ? Title Patient will actively bridge in bed to assist with pericare as needed   ? Time 12   ? Period Weeks   ? Status Achieved   pt is bridging well 08/03/21  ?  ? OT LONG TERM GOAL #5  ? Title Patient will transfer to adapted commode with max assist in prep for toileting   ? Time 12   ? Period Weeks   ? Status Achieved   working on troubleshooting [facility] and transfers for continuing to progess towards this goal 08/03/21  ?  ? OT LONG TERM GOAL #6  ? Title Patient will transfer into/ out of shower with mod assist, and min assist for seated control during shower   ? Time 12   ? Period Weeks   ? Status Partially Met   has not attempted showers yet but is progressing with trunk control and sitting balance significantly. Troubleshooting with transfers  for  getting in and out of bathroom and on tub bench. maintaining sitting balance w SBA however unable to get in shower  ?  ? OT LONG TERM GOAL #7  ? Title Patient will feed himself 30% meal using utensils after set up   ? Time 12   ? Period Weeks   ? Status Not Met   pt is not using utensils and hesitatnt with using universal cuff with utensils at this time. Pt is eating finger foods with increased independence 08/03/21  ? Target Date 09/18/21   ?  ? OT LONG TERM GOAL #8  ? Title Patient will assist with pulling pants over hips (up/down) to aide with toileting   ? Time 12   ? Period Weeks   ? Status Partially Met   pt reports pulling pants up pants while bridging - not fully completing d/t BUE strength but is assisting some.  ? Target Date 09/18/21   ? ?  ?  ? ?  ? ? ? ? ? ? ? ? Plan - 08/17/21 1319   ? ? Clinical Impression Statement Pt is discharging from OT at tihs time d/t transportation being discontinued and patient unable to make it to this facility. Pt is transitioning to care closer to home where transportation is provided in the same county.   ? OT Occupational Profile and History Comprehensive Assessment- Review of records and extensive additional review of physical, cognitive, psychosocial history related to current functional performance   ? Occupational performance deficits (Please refer to evaluation for details): ADL's;IADL's;Rest and Sleep;Leisure   ? Body Structure / Function / Physical Skills ADL;Decreased knowledge of use of DME;Strength;Balance;Dexterity;GMC;Pain;Tone;Body mechanics;Proprioception;UE functional use;Cardiopulmonary status limiting activity;Endurance;IADL;ROM;Fascial restriction;Improper spinal/pelvic alignment;Coordination;Flexibility;Mobility;Sensation;Wound;FMC;Muscle spasms;Skin integrity   ? Rehab Potential Good   ? Clinical Decision Making Multiple treatment options, significant modification of task necessary   ? Comorbidities Affecting Occupational Performance: Presence of  comorbidities impacting occupational performance   ? Comorbidities impacting occupational performance description: AIDP, Chronic back pain, Descending thoracic aortic anneurysm   ? Modification or Assistance to [employer]

## 2021-08-17 NOTE — Therapy (Signed)
?OUTPATIENT PHYSICAL THERAPY TREATMENT NOTE and ?DISCHARGE SUMMARY ? ? ?Patient Name: Brian Espinoza ?MRN: [id number] ?DOB:07-29-1955, 66 y.o., male ?Today's Date: 08/17/2021 ? ?PCP: Ellis Parents, MD ?REFERRING PROVIDER: Leandro Reasoner, MD ? ?PHYSICAL THERAPY DISCHARGE SUMMARY ? ?Visits from Start of Care: 48 ? ?Current functional level related to goals / functional outcomes: ?See LTG achievement and impression statement above. ?  ?Remaining deficits: ?Impaired/absent sensation, impaired coordination, ataxia, impaired strength and mm paralysis ?  ?Education / Equipment: ?HEP; will likely need custom AFO once ambulating more regularly.  ? ?Patient agrees to discharge. Patient goals were partially met. Patient is being discharged due to  transportation issues and needing to attend rehab facility closer to his home. ? ? ? PT End of Session - 08/17/21 1406   ? ? Visit Number 48   ? Number of Visits 52   ? Date for PT Re-Evaluation 08/16/21   ? Authorization Type Medicare A&B; [employer] - 10th visit PN   ? Authorization Time Period KX MODIFIER REQUIRED NOW   ? Progress Note Due on Visit 50   ? PT Start Time 4098   ? PT Stop Time 1191   ? PT Time Calculation (min) 41 min   ? Equipment Utilized During Treatment Other (comment)   SARA Plus  ? Activity Tolerance Patient tolerated treatment well   ? Behavior During Therapy [facility] for tasks assessed/performed   ? ?  ?  ? ?  ? ? ?Past Medical History:  ?Diagnosis Date  ? Acute inflammatory demyelinating polyneuropathy (HCC)   ? Acute on chronic respiratory failure with hypoxia (HCC)   ? Atrial fibrillation with RVR ([place])   ? Dysautonomia ([place])   ? Healthcare associated bacterial pneumonia   ? ?No past surgical history on file. ?Patient Active Problem List  ? Diagnosis Date Noted  ? Acute on chronic respiratory failure with hypoxia (HCC)   ? Acute inflammatory demyelinating polyneuropathy (HCC)   ? Dysautonomia ([place])   ? Atrial fibrillation with RVR ([place])   ? Healthcare associated bacterial  pneumonia   ? ? ?REFERRING DIAG: Guillain Barre ? ?THERAPY DIAG:  ?Muscle weakness (generalized) ? ?Other disturbances of skin sensation ? ?Unsteadiness on feet ? ?Transient paralysis ? ?Other abnormalities of gait and mobility ? ?PERTINENT HISTORY: Chest pain, dissection of descending thoracic aorta, aneurysm of artery, a-fib, HTN, systolic CHF, oral phase dysphagia, sacral wound, PNA ? ?PRECAUTIONS: Monitor systolic BP: keep between 120-130; fall ? ?SUBJECTIVE: Pt was able to get set up with [facility] to continue therapy; first appointment is Monday.  Expressed appreciation to therapists for working with him. ? ?PAIN:  ?Are you having pain? No ? ?TODAY'S TREATMENT:  ? 08/17/21: ?BP: 115/59, HR: 56 ?Pt set up in [place] plus walking sling in wheelchair and performed sit > stand from wheelchair with use of SARA plus with cues for weight shift forwards over feet once standing.  Continued NMR gait training around gym with SARA plus and +2 PT and rehab volunteer assisting with navigating, steering and stabilizing SARA plus.  Did not don AFO today.  Performed gait around gym with PT on R providing facilitation at pelvis for anterior weight shift over R stance LE and assisting with placement of RLE by preventing adduction during swing phase.  PT on L also provided facilitation at pelvis for anterior weight shift over LLE during stance and continued to provide total verbal cues for weight shifting, initiation of swing phase, cues to lift L knee to compensate for L  foot drop, to widen BOS and intermittent cues for shorter step length on L to prevent genu recurvatum.  Decreased cues required today to soften L strike on floor.   ? ?Once seated in wheelchair, pt able to hold arm rests with bilat UE, boost self up pushing through UE and scoot hips posteriorly and to the L to center pelvis in wheelchair.  Pt BP after gait: 125/63, HR: 74.   ? ?GAIT: ?Gait pattern: step through pattern, decreased ankle dorsiflexion- Right,  decreased ankle dorsiflexion- Left, Right foot flat, Left foot flat, genu recurvatum- Right, genu recurvatum- Left, and ataxic ?Distance walked: 25' ?Assistive device utilized: [employer] with walking harness ?Level of assistance: +2; intermittent +3 ? ?08/15/21: ?Scooted forward in chair with PT cuing to lean forward to keep feet on ground and lean to each side bringing one hip forward at a time. Performed squat pivot transfer w/c to mat to left with 2 scoots to get fully to mat. Min/mod assist to maintain feet on floor and cue for anterior lean. Had pt adjust feet half way. Pt performed scooting to left on mat CGA with PT stabilizing feet on floor. ?Sit to supine coming down on left side first with min assist only to clear feet from hooking under mat. Rolling to left with cues to reach across body with right arm. Left sidelying to sit with cues to keep hips bent during movement bringing feet off first then pushing up. Cues to push with left elbow and right hand. Min assist to rise.  ? Reviewed medbridge HEP and updated exercises:  ?Supine Bridge - 1 x daily - 5 x weekly - 1 sets - 10 reps- verbal and tactile cues to prevent hip abduction. ?Hip Abduction and Adduction Caregiver PROM - 1 x daily - 7 x weekly - 2 sets - 10 reps-verbally discussed ?Hip Internal and External Rotation Caregiver PROM - 2-3 x daily - 7 x weekly - 1 sets - 3 reps - 1 min hold- demonstrated on left leg ?Supine Piriformis Stretch with Leg Straight - 2-3 x daily - 7 x weekly - 1 sets - 3 reps - 30 sec-1 min hold- demonstrated on left leg ?Performed seated modified crunch leaning back as far as he could and then coming forward x 5 ?Supine March - 1 x daily - 7 x weekly - 3 sets - 10 reps- verbal cues for slow, controlled movement ?Seated March - 1 x daily - 7 x weekly - 2 sets - 10 reps-verbal cues for slow, controlled movement ?Seated Long Arc Quad - 1 x daily - 7 x weekly - 2 sets - 10 reps- verbal cues for slow, controlled movement ?Sit to  Stand - 1 x daily - 7 x weekly - 1 sets - 5 reps- performed with +2 assist with therapist on each side blocking knees and mirror in front for cuing. In standing worked on standing upright and weight shifting side to side x 3 then keeping slight bend in knees prior to sitting down. Daughter observing throughout so that they can attempt at home with +2 assist. ?  ? 08/13/2021: ?Performed transfer w/c > mat to R squat scooting with pt increasing use of UE to perform.  Cued pt to use UE on arm rest and on mat to assist with push off from surface; PT assisted with maintaining placement of feet on floor and maintaining anterior lean to allow hips to lift and scoot to R.  Performed transition from w/c > mat  in 2 scoots with min-mod A from PT.  Donned L Lowanda Foster and R [place] GRAFO with T strap to control ankle supination and inversion.  Pt set up in [place] plus with walking harness to assess if pt would be able to complete gait training with only two therapists (removed foot plate and knee support).  Performed sit > stand with UE supported on SARA and min A.  Performed gait around gym with Clarise Cruz plus with one therapist guiding SARA and second therapist providing mod-max A at pelvis for weight shifting laterally and anterior over stance LE; PT also provided total verbal cues for weight shifting, initiation of stepping and cues for "soft, small steps" forwards and verbal cues for foot placement due to frequent adduction/scissoring of RLE.  Pt cued to take smaller steps with LLE due to pt landing on heel and locking L knee preventing anterior weight shift.  Greater difficulty with keeping COG in midline today due to tendency of SARA to veer L and R (less stable than BWS).   Returned to sitting in wheelchair at end of session. ?GAIT: ?Gait pattern: step through pattern, decreased ankle dorsiflexion- Right, decreased ankle dorsiflexion- Left, Right foot flat, Left foot flat, genu recurvatum- Right, genu recurvatum- Left,  scissoring, and ataxic ?Distance walked: 81' ?Assistive device utilized: Clarise Cruz Plus with walking harness, bilat AFO ?Level of assistance: +2; intermittent +3 ? ? ? ? ?HOME EXERCISE PROGRAM: ? [id number]. Updated

## 2021-08-20 ENCOUNTER — Ambulatory Visit: Payer: Medicare Other | Admitting: Physical Therapy

## 2021-08-20 ENCOUNTER — Ambulatory Visit: Payer: Medicare Other | Admitting: Occupational Therapy

## 2021-08-22 ENCOUNTER — Ambulatory Visit: Payer: Medicare Other

## 2021-08-22 ENCOUNTER — Encounter: Payer: Medicare Other | Admitting: Occupational Therapy

## 2021-08-24 ENCOUNTER — Encounter: Payer: Medicare Other | Admitting: Occupational Therapy

## 2021-08-24 ENCOUNTER — Ambulatory Visit: Payer: Medicare Other

## 2021-08-27 ENCOUNTER — Encounter: Payer: Medicare Other | Admitting: Occupational Therapy

## 2021-08-29 ENCOUNTER — Encounter: Payer: Medicare Other | Admitting: Occupational Therapy

## 2021-08-29 ENCOUNTER — Ambulatory Visit: Payer: Medicare Other

## 2021-08-31 ENCOUNTER — Encounter: Payer: Medicare Other | Admitting: Occupational Therapy

## 2021-08-31 ENCOUNTER — Ambulatory Visit: Payer: Medicare Other | Admitting: Physical Therapy

## 2021-09-03 ENCOUNTER — Ambulatory Visit: Payer: Medicare Other

## 2021-09-03 ENCOUNTER — Encounter: Payer: Medicare Other | Admitting: Occupational Therapy

## 2021-09-05 ENCOUNTER — Encounter: Payer: Medicare Other | Admitting: Occupational Therapy

## 2021-09-07 ENCOUNTER — Ambulatory Visit: Payer: Medicare Other

## 2021-09-07 ENCOUNTER — Encounter: Payer: Medicare Other | Admitting: Occupational Therapy

## 2021-09-10 ENCOUNTER — Encounter: Payer: Medicare Other | Admitting: Occupational Therapy

## 2021-09-10 ENCOUNTER — Ambulatory Visit: Payer: Medicare Other | Admitting: Physical Therapy

## 2021-09-12 ENCOUNTER — Encounter: Payer: Medicare Other | Admitting: Occupational Therapy

## 2021-09-12 ENCOUNTER — Ambulatory Visit: Payer: Medicare Other

## 2021-09-14 ENCOUNTER — Encounter: Payer: Medicare Other | Admitting: Occupational Therapy

## 2021-09-14 ENCOUNTER — Ambulatory Visit: Payer: Medicare Other

## 2021-09-17 ENCOUNTER — Encounter: Payer: Medicare Other | Admitting: Occupational Therapy

## 2021-09-17 ENCOUNTER — Ambulatory Visit: Payer: Medicare Other

## 2021-09-19 ENCOUNTER — Encounter: Payer: Medicare Other | Admitting: Occupational Therapy

## 2021-09-21 ENCOUNTER — Encounter: Payer: Medicare Other | Admitting: Occupational Therapy

## 2021-09-21 ENCOUNTER — Ambulatory Visit: Payer: Medicare Other

## 2021-09-24 ENCOUNTER — Encounter: Payer: Medicare Other | Admitting: Occupational Therapy

## 2021-09-24 ENCOUNTER — Ambulatory Visit: Payer: Medicare Other

## 2021-09-26 ENCOUNTER — Encounter: Payer: Medicare Other | Admitting: Occupational Therapy

## 2021-09-26 ENCOUNTER — Ambulatory Visit: Payer: Medicare Other

## 2021-09-28 ENCOUNTER — Ambulatory Visit: Payer: Medicare Other

## 2021-09-28 ENCOUNTER — Encounter: Payer: Medicare Other | Admitting: Occupational Therapy

## 2021-10-01 ENCOUNTER — Ambulatory Visit: Payer: Medicare Other

## 2021-10-01 ENCOUNTER — Encounter: Payer: Medicare Other | Admitting: Occupational Therapy

## 2021-10-03 ENCOUNTER — Encounter: Payer: Medicare Other | Admitting: Occupational Therapy

## 2021-10-03 ENCOUNTER — Ambulatory Visit: Payer: Medicare Other

## 2021-10-05 ENCOUNTER — Encounter: Payer: Medicare Other | Admitting: Occupational Therapy

## 2021-10-08 ENCOUNTER — Ambulatory Visit: Payer: Medicare Other | Admitting: Physical Therapy

## 2021-10-08 ENCOUNTER — Encounter: Payer: Medicare Other | Admitting: Occupational Therapy

## 2021-10-10 ENCOUNTER — Ambulatory Visit: Payer: Medicare Other

## 2021-10-10 ENCOUNTER — Encounter: Payer: Medicare Other | Admitting: Occupational Therapy

## 2021-10-12 ENCOUNTER — Ambulatory Visit: Payer: Medicare Other

## 2021-10-12 ENCOUNTER — Encounter: Payer: Medicare Other | Admitting: Occupational Therapy

## 2021-10-15 ENCOUNTER — Encounter: Payer: Medicare Other | Admitting: Occupational Therapy

## 2021-10-15 ENCOUNTER — Ambulatory Visit: Payer: Medicare Other | Admitting: Physical Therapy

## 2021-10-17 ENCOUNTER — Encounter: Payer: Medicare Other | Admitting: Occupational Therapy

## 2021-10-17 ENCOUNTER — Ambulatory Visit: Payer: Medicare Other

## 2021-10-19 ENCOUNTER — Encounter: Payer: Medicare Other | Admitting: Occupational Therapy

## 2021-10-19 ENCOUNTER — Ambulatory Visit: Payer: Medicare Other

## 2021-10-24 ENCOUNTER — Ambulatory Visit: Payer: Medicare Other

## 2021-10-24 ENCOUNTER — Encounter: Payer: Medicare Other | Admitting: Occupational Therapy

## 2021-10-26 ENCOUNTER — Ambulatory Visit: Payer: Medicare Other

## 2021-10-26 ENCOUNTER — Encounter: Payer: Medicare Other | Admitting: Occupational Therapy

## 2021-10-29 ENCOUNTER — Ambulatory Visit: Payer: Medicare Other

## 2021-10-29 ENCOUNTER — Encounter: Payer: Medicare Other | Admitting: Occupational Therapy

## 2021-10-31 ENCOUNTER — Ambulatory Visit: Payer: Medicare Other

## 2021-10-31 ENCOUNTER — Encounter: Payer: Medicare Other | Admitting: Occupational Therapy

## 2021-11-02 ENCOUNTER — Ambulatory Visit: Payer: Medicare Other

## 2021-11-02 ENCOUNTER — Encounter: Payer: Medicare Other | Admitting: Occupational Therapy

## 2021-11-05 ENCOUNTER — Ambulatory Visit: Payer: Medicare Other

## 2021-11-05 ENCOUNTER — Encounter: Payer: Medicare Other | Admitting: Occupational Therapy

## 2021-11-07 ENCOUNTER — Ambulatory Visit: Payer: Medicare Other

## 2021-11-07 ENCOUNTER — Encounter: Payer: Medicare Other | Admitting: Occupational Therapy

## 2021-11-09 ENCOUNTER — Ambulatory Visit: Payer: Medicare Other

## 2021-11-12 ENCOUNTER — Ambulatory Visit: Payer: Medicare Other

## 2021-11-14 ENCOUNTER — Encounter: Payer: Medicare Other | Admitting: Occupational Therapy

## 2021-11-16 ENCOUNTER — Encounter: Payer: Medicare Other | Admitting: Occupational Therapy

## 2021-11-16 ENCOUNTER — Ambulatory Visit: Payer: Medicare Other

## 2021-11-19 ENCOUNTER — Ambulatory Visit: Payer: Medicare Other

## 2021-11-19 ENCOUNTER — Encounter: Payer: Medicare Other | Admitting: Occupational Therapy

## 2021-11-21 ENCOUNTER — Ambulatory Visit: Payer: Medicare Other

## 2021-11-21 ENCOUNTER — Encounter: Payer: Medicare Other | Admitting: Occupational Therapy

## 2021-11-23 ENCOUNTER — Ambulatory Visit: Payer: Medicare Other

## 2021-11-23 ENCOUNTER — Ambulatory Visit: Payer: Medicare Other | Admitting: Occupational Therapy

## 2021-12-12 IMAGING — DX DG CHEST 1V PORT
1 series · 1 of 1 positions shown · non-contrast
Comparison: Eleven days ago

CLINICAL DATA: Respiratory failure

EXAM:
PORTABLE CHEST 1 VIEW

[chest ap]
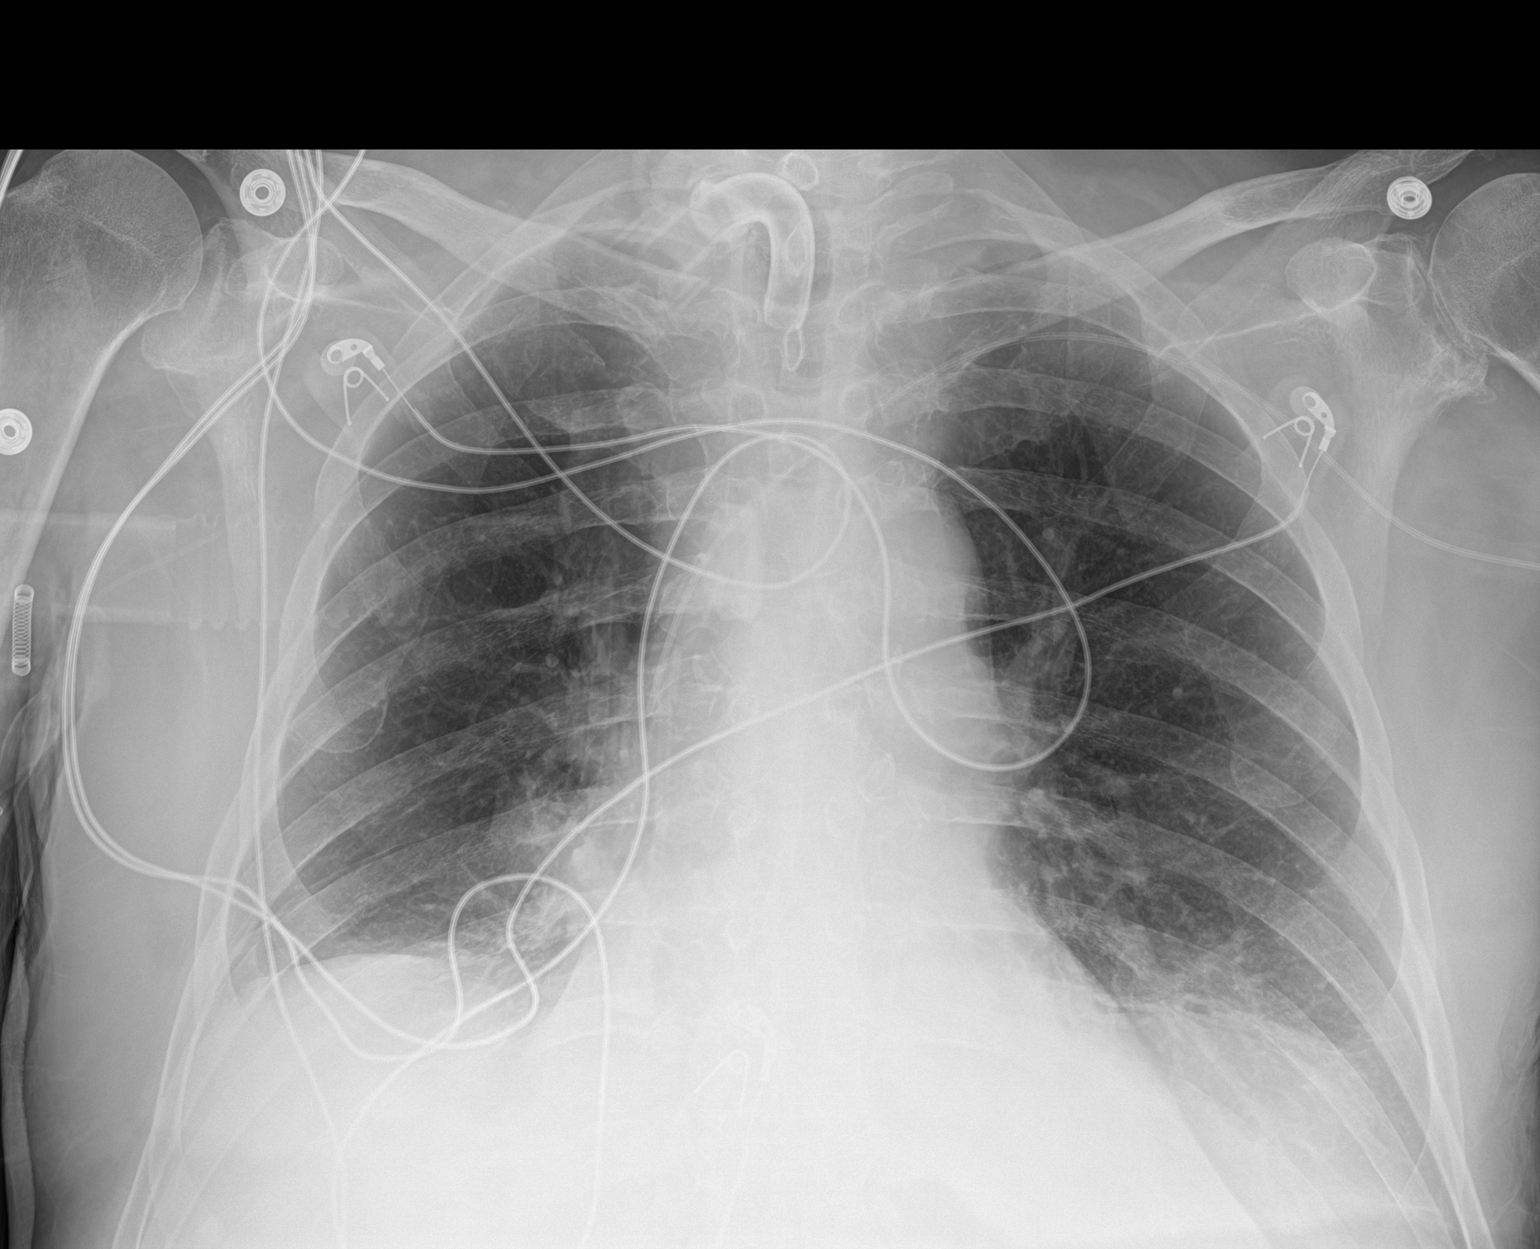

[1 of 1 positions shown; findings below may reference images not displayed]

FINDINGS: Indistinct opacity at the lung bases. No edema, effusion, or
pneumothorax. Normal heart size and mediastinal contours.
Tracheostomy tube in place. Left PICC with tip at the SVC. Artifact
from EKG leads.
IMPRESSION: Similar mild opacity at the bases favoring atelectasis.

## 2021-12-18 IMAGING — DX DG CHEST 1V PORT
1 series · 1 of 1 positions shown · non-contrast
Comparison: Six days ago

CLINICAL DATA: Respirator dependent

EXAM:
PORTABLE CHEST 1 VIEW

[chest]
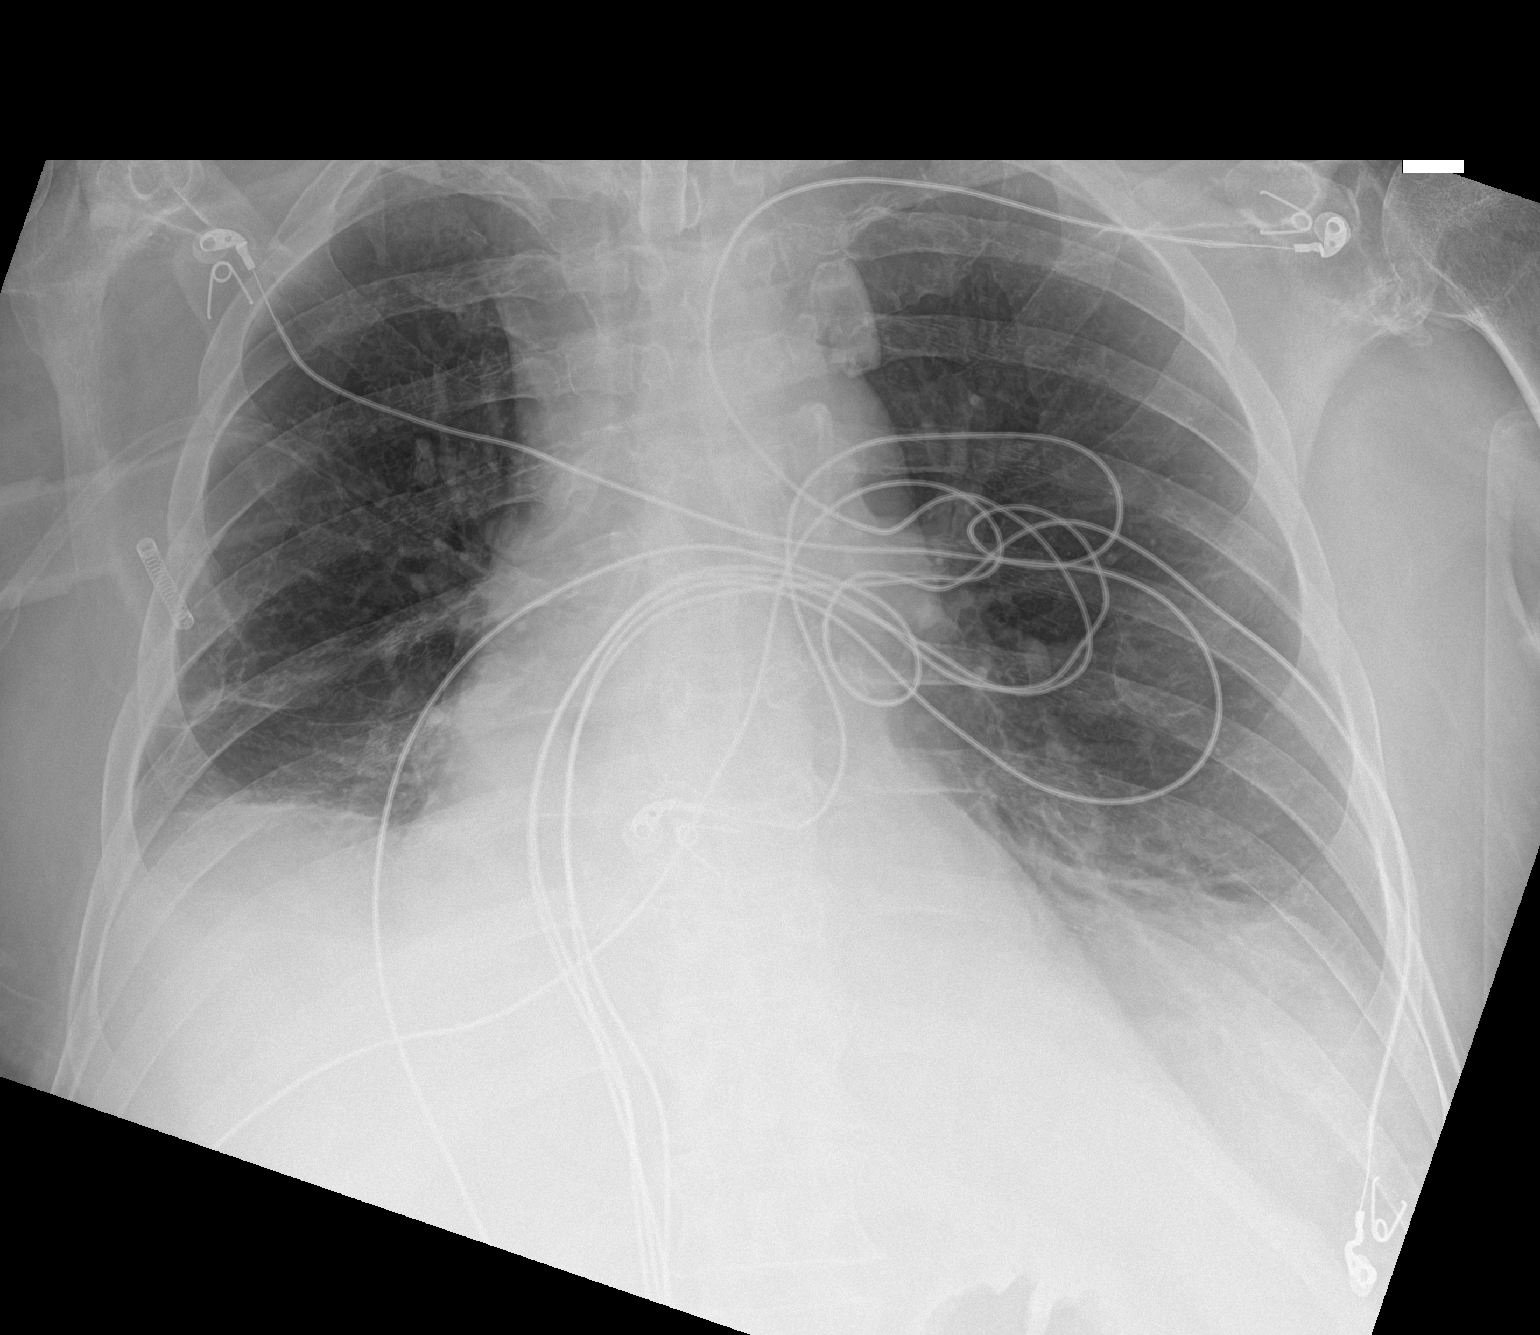

[1 of 1 positions shown; findings below may reference images not displayed]

FINDINGS: Hazy opacity at the bases with diaphragm obscuration. Stable heart
size and mediastinal contours. Tracheostomy tube remains in place.

Extensive artifact from EKG leads.
IMPRESSION: Presumed atelectasis at the bases. No significant change compared to
6 days prior.

## 2022-01-03 IMAGING — DX DG CHEST 1V PORT
1 series · 1 of 1 positions shown · non-contrast
Comparison: Chest x-ray dated July 16, 2020.

CLINICAL DATA: Pneumonia.

EXAM:
PORTABLE CHEST 1 VIEW

[chest ap]
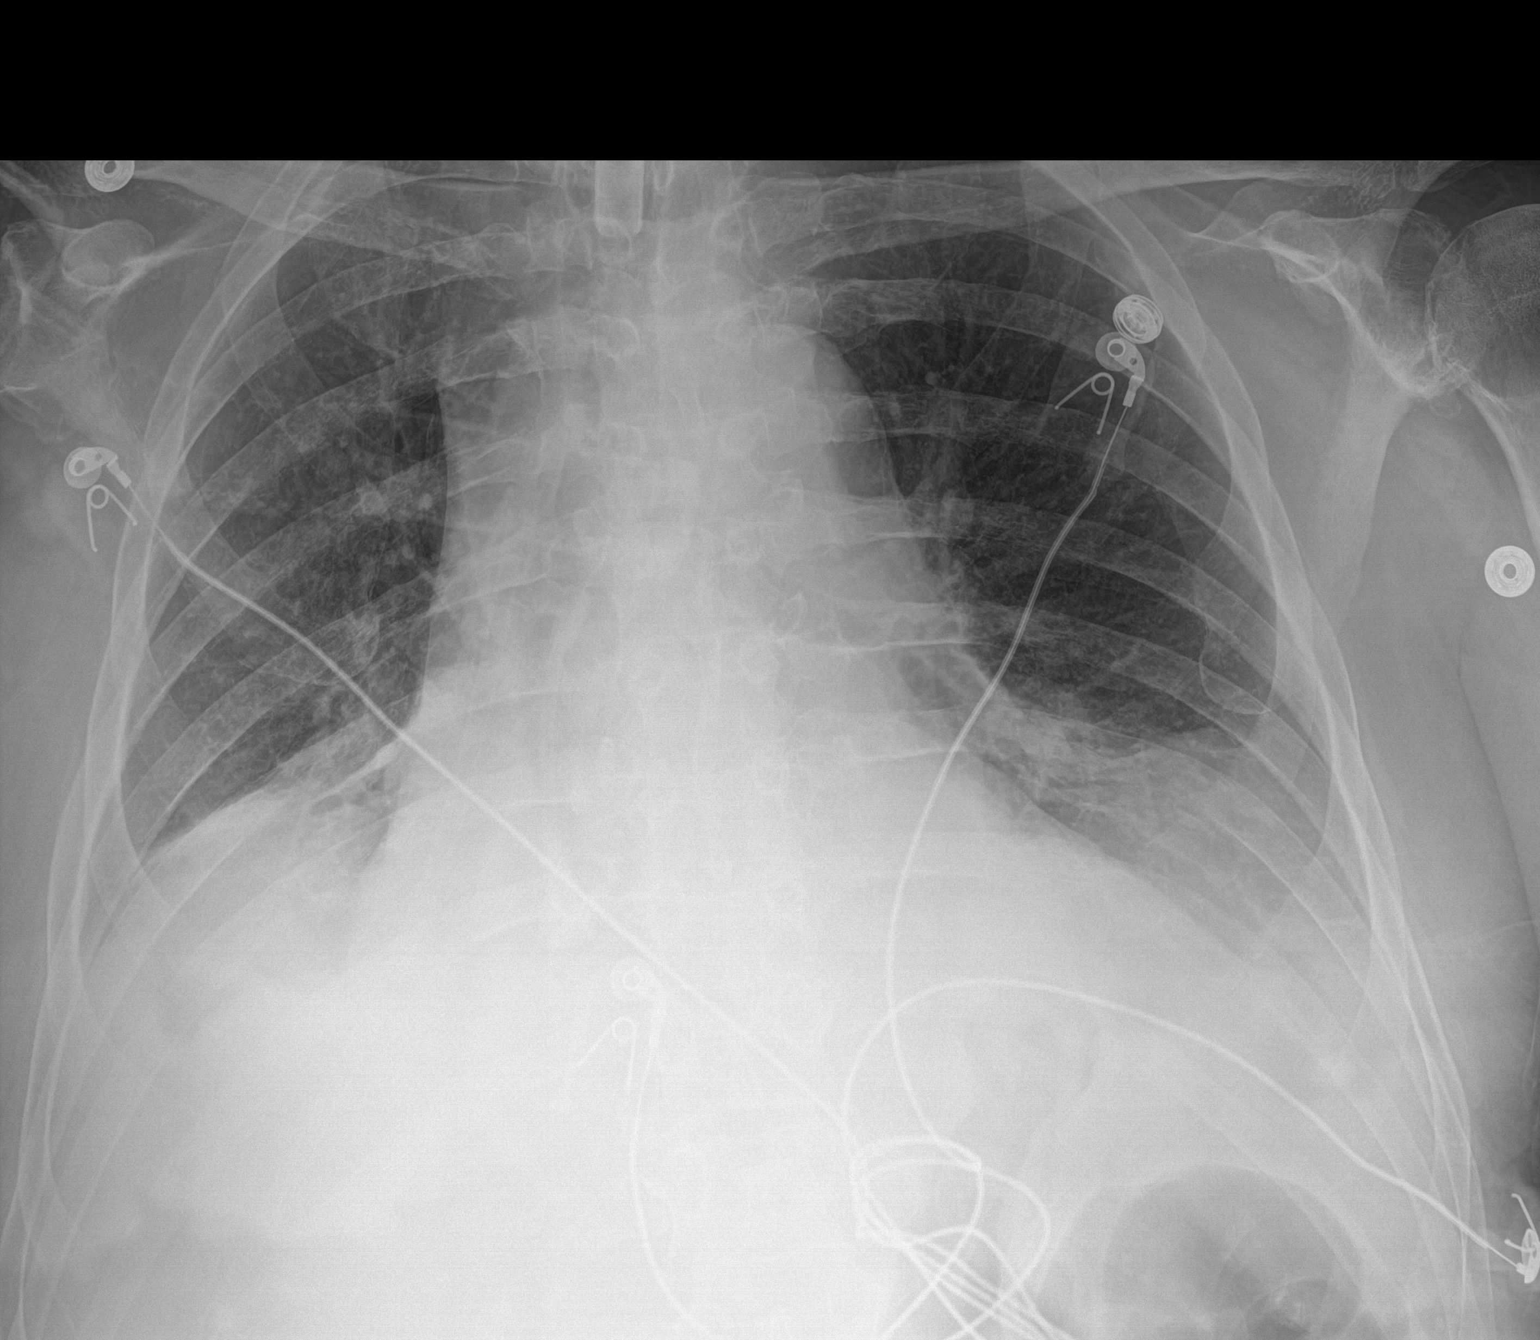

[1 of 1 positions shown; findings below may reference images not displayed]

FINDINGS: Unchanged tracheostomy tube. Stable cardiomediastinal silhouette.
Unchanged hazy opacities at both lung bases with obscuration of the
diaphragm. No pneumothorax. No acute osseous abnormality.
IMPRESSION: 1. Unchanged bibasilar opacities, presumably atelectasis. There may
be small layering pleural effusions as well. Appearance is unchanged
compared to the prior study from 2 weeks ago.

## 2022-01-21 IMAGING — DX DG CHEST 1V PORT
1 series · 1 of 1 positions shown · non-contrast
Comparison: 08/14/2020

CLINICAL DATA: Respiratory failure

EXAM:
PORTABLE CHEST 1 VIEW

[chest ap]
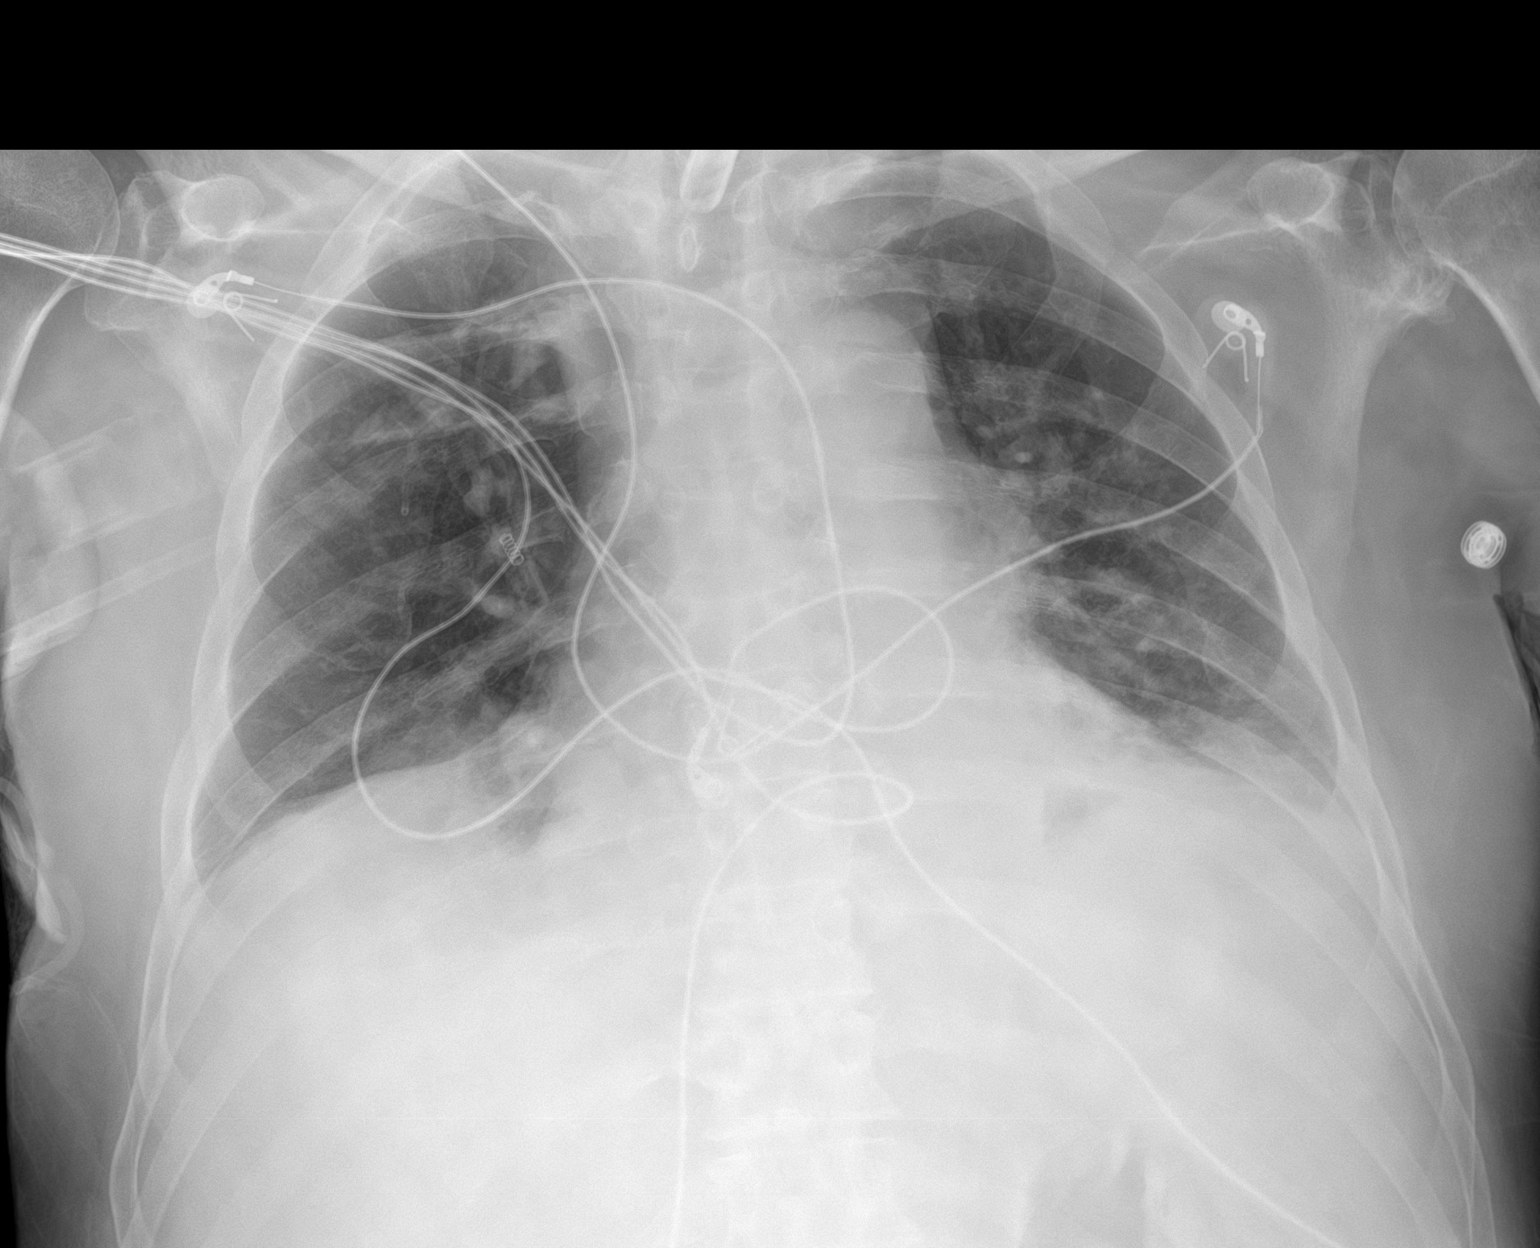

[1 of 1 positions shown; findings below may reference images not displayed]

FINDINGS: Tracheostomy in satisfactory position.

Mild bibasilar atelectasis. Increased interstitial markings without
frank interstitial edema. No pneumothorax.

The heart is top-normal in size.
IMPRESSION: Mild bibasilar atelectasis.  Tracheostomy in satisfactory position.

## 2022-02-01 IMAGING — DX DG CHEST 1V PORT
1 series · 1 of 1 positions shown · non-contrast
Comparison: 08/19/2020

CLINICAL DATA: Acute on chronic respiratory failure

EXAM:
PORTABLE CHEST 1 VIEW

[chest ap]
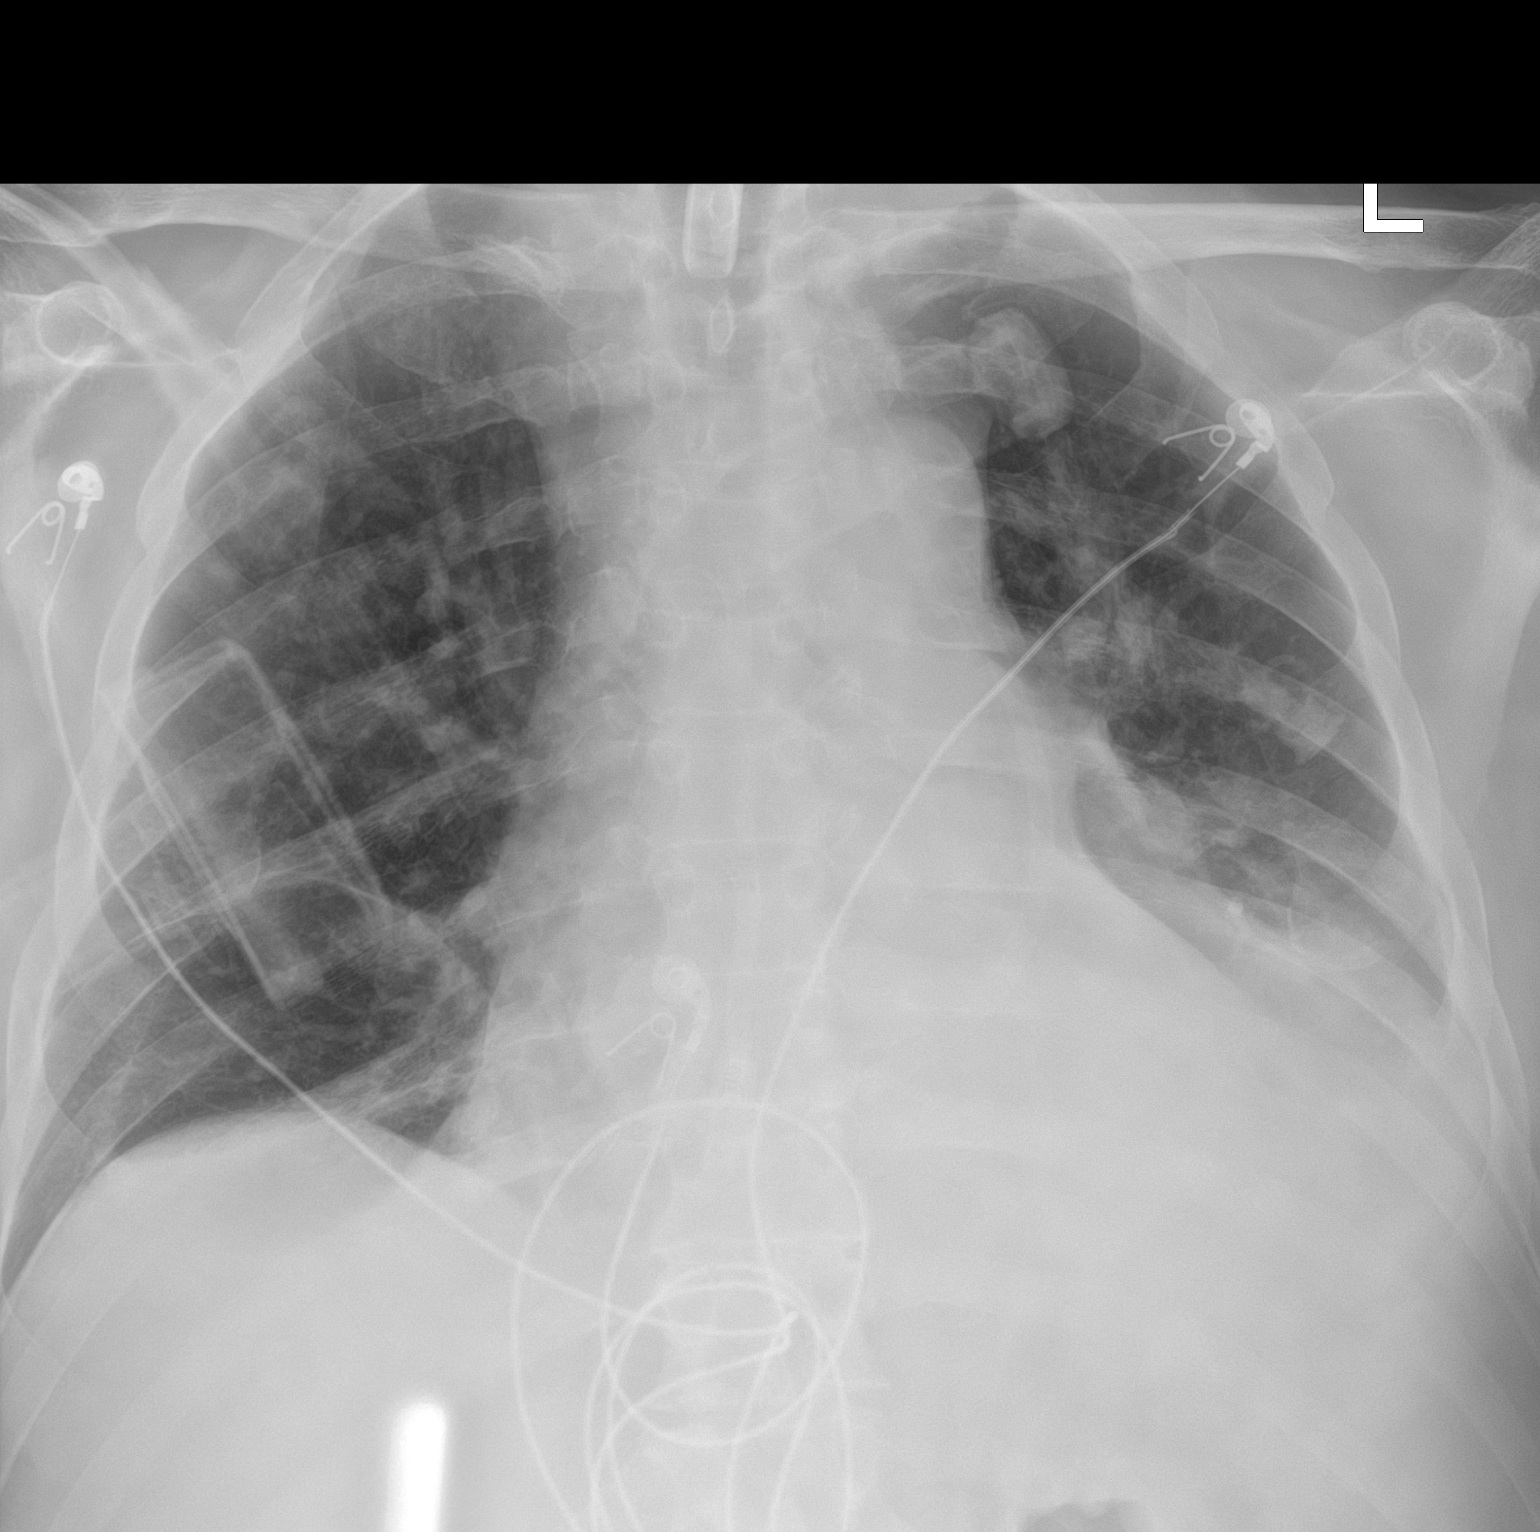

[1 of 1 positions shown; findings below may reference images not displayed]

FINDINGS: Small left pleural effusion with left lower lobe atelectasis or
infiltrate, worsening since prior study. Right base opacity, likely
atelectasis. Tracheostomy remains in place, unchanged. Heart is
borderline in size. Mild vascular congestion. No acute bony
abnormality.
IMPRESSION: Left lower lobe atelectasis or infiltrate with small left effusion.

Right base atelectasis.

Borderline cardiomegaly with vascular congestion.
